# Patient Record
Sex: Male | Born: 1950
Health system: Southern US, Community
[De-identification: ages and names within clinical notes are randomized; demographics above are authoritative.]

## PROBLEM LIST (undated history)

## (undated) DIAGNOSIS — I2510 Atherosclerotic heart disease of native coronary artery without angina pectoris: Secondary | ICD-10-CM

## (undated) DIAGNOSIS — C801 Malignant (primary) neoplasm, unspecified: Secondary | ICD-10-CM

## (undated) DIAGNOSIS — E785 Hyperlipidemia, unspecified: Secondary | ICD-10-CM

## (undated) DIAGNOSIS — Z720 Tobacco use: Secondary | ICD-10-CM

## (undated) DIAGNOSIS — M1990 Unspecified osteoarthritis, unspecified site: Secondary | ICD-10-CM

## (undated) DIAGNOSIS — I10 Essential (primary) hypertension: Secondary | ICD-10-CM

## (undated) DIAGNOSIS — I219 Acute myocardial infarction, unspecified: Secondary | ICD-10-CM

## (undated) DIAGNOSIS — N289 Disorder of kidney and ureter, unspecified: Secondary | ICD-10-CM

## (undated) DIAGNOSIS — I739 Peripheral vascular disease, unspecified: Secondary | ICD-10-CM

## (undated) DIAGNOSIS — J449 Chronic obstructive pulmonary disease, unspecified: Secondary | ICD-10-CM

## (undated) HISTORY — DX: Essential (primary) hypertension: I10

## (undated) HISTORY — DX: Atherosclerotic heart disease of native coronary artery without angina pectoris: I25.10

## (undated) HISTORY — DX: Peripheral vascular disease, unspecified: I73.9

## (undated) HISTORY — PX: APPENDECTOMY: SHX54

## (undated) HISTORY — DX: Tobacco use: Z72.0

## (undated) HISTORY — PX: CHOLECYSTECTOMY: SHX55

## (undated) HISTORY — DX: Acute myocardial infarction, unspecified: I21.9

## (undated) HISTORY — DX: Hyperlipidemia, unspecified: E78.5

## (undated) HISTORY — PX: CORONARY ANGIOPLASTY: SHX604

## (undated) HISTORY — DX: Disorder of kidney and ureter, unspecified: N28.9

## (undated) HISTORY — DX: Unspecified osteoarthritis, unspecified site: M19.90

---

## 2001-01-03 ENCOUNTER — Emergency Department (HOSPITAL_COMMUNITY): Admission: EM | Admit: 2001-01-03 | Discharge: 2001-01-03 | Payer: Self-pay | Admitting: Emergency Medicine

## 2001-01-14 ENCOUNTER — Emergency Department (HOSPITAL_COMMUNITY): Admission: EM | Admit: 2001-01-14 | Discharge: 2001-01-14 | Payer: Self-pay | Admitting: Emergency Medicine

## 2002-06-30 ENCOUNTER — Inpatient Hospital Stay (HOSPITAL_COMMUNITY): Admission: EM | Admit: 2002-06-30 | Discharge: 2002-07-04 | Payer: Self-pay | Admitting: Emergency Medicine

## 2002-06-30 ENCOUNTER — Encounter: Payer: Self-pay | Admitting: Emergency Medicine

## 2003-05-15 ENCOUNTER — Inpatient Hospital Stay (HOSPITAL_COMMUNITY): Admission: EM | Admit: 2003-05-15 | Discharge: 2003-05-18 | Payer: Self-pay | Admitting: Emergency Medicine

## 2003-05-15 ENCOUNTER — Encounter: Payer: Self-pay | Admitting: Emergency Medicine

## 2005-06-21 ENCOUNTER — Ambulatory Visit: Payer: Self-pay | Admitting: Cardiology

## 2005-07-05 ENCOUNTER — Ambulatory Visit: Payer: Self-pay

## 2005-08-29 ENCOUNTER — Ambulatory Visit: Payer: Self-pay | Admitting: Cardiology

## 2005-09-20 ENCOUNTER — Ambulatory Visit: Payer: Self-pay

## 2006-01-01 ENCOUNTER — Ambulatory Visit: Payer: Self-pay | Admitting: Cardiology

## 2006-07-25 ENCOUNTER — Ambulatory Visit: Payer: Self-pay | Admitting: Cardiology

## 2006-07-25 ENCOUNTER — Ambulatory Visit: Payer: Self-pay

## 2006-08-08 ENCOUNTER — Ambulatory Visit: Payer: Self-pay

## 2006-08-08 ENCOUNTER — Encounter: Payer: Self-pay | Admitting: Cardiology

## 2006-09-12 ENCOUNTER — Ambulatory Visit: Payer: Self-pay | Admitting: Cardiology

## 2006-12-19 ENCOUNTER — Emergency Department (HOSPITAL_COMMUNITY): Admission: EM | Admit: 2006-12-19 | Discharge: 2006-12-19 | Payer: Self-pay | Admitting: Family Medicine

## 2007-03-13 ENCOUNTER — Ambulatory Visit: Payer: Self-pay

## 2007-03-13 ENCOUNTER — Ambulatory Visit: Payer: Self-pay | Admitting: Cardiology

## 2007-04-07 ENCOUNTER — Encounter: Admission: RE | Admit: 2007-04-07 | Discharge: 2007-04-07 | Payer: Self-pay | Admitting: Nephrology

## 2007-06-19 ENCOUNTER — Ambulatory Visit: Payer: Self-pay | Admitting: Cardiology

## 2007-09-11 ENCOUNTER — Ambulatory Visit: Payer: Self-pay

## 2007-12-25 ENCOUNTER — Ambulatory Visit: Payer: Self-pay | Admitting: Cardiology

## 2008-05-20 ENCOUNTER — Ambulatory Visit: Payer: Self-pay | Admitting: Vascular Surgery

## 2008-05-20 ENCOUNTER — Ambulatory Visit (HOSPITAL_COMMUNITY): Admission: RE | Admit: 2008-05-20 | Discharge: 2008-05-20 | Payer: Self-pay | Admitting: Internal Medicine

## 2008-10-14 ENCOUNTER — Ambulatory Visit: Payer: Self-pay

## 2008-10-14 ENCOUNTER — Ambulatory Visit: Payer: Self-pay | Admitting: Cardiology

## 2009-04-07 ENCOUNTER — Encounter (INDEPENDENT_AMBULATORY_CARE_PROVIDER_SITE_OTHER): Payer: Self-pay | Admitting: *Deleted

## 2009-06-23 ENCOUNTER — Ambulatory Visit: Payer: Self-pay | Admitting: Vascular Surgery

## 2009-06-23 ENCOUNTER — Encounter: Payer: Self-pay | Admitting: Cardiology

## 2009-08-22 ENCOUNTER — Encounter: Payer: Self-pay | Admitting: Cardiology

## 2009-09-08 ENCOUNTER — Ambulatory Visit: Payer: Self-pay | Admitting: Cardiology

## 2009-09-08 ENCOUNTER — Telehealth (INDEPENDENT_AMBULATORY_CARE_PROVIDER_SITE_OTHER): Payer: Self-pay | Admitting: *Deleted

## 2009-09-08 DIAGNOSIS — I10 Essential (primary) hypertension: Secondary | ICD-10-CM | POA: Insufficient documentation

## 2009-09-08 DIAGNOSIS — E1029 Type 1 diabetes mellitus with other diabetic kidney complication: Secondary | ICD-10-CM | POA: Insufficient documentation

## 2009-09-08 DIAGNOSIS — F17200 Nicotine dependence, unspecified, uncomplicated: Secondary | ICD-10-CM | POA: Insufficient documentation

## 2009-09-08 DIAGNOSIS — N183 Chronic kidney disease, stage 3 (moderate): Secondary | ICD-10-CM

## 2009-09-08 DIAGNOSIS — I2510 Atherosclerotic heart disease of native coronary artery without angina pectoris: Secondary | ICD-10-CM | POA: Insufficient documentation

## 2009-09-08 DIAGNOSIS — E7800 Pure hypercholesterolemia, unspecified: Secondary | ICD-10-CM

## 2009-09-22 ENCOUNTER — Encounter: Payer: Self-pay | Admitting: Cardiology

## 2009-10-26 ENCOUNTER — Ambulatory Visit: Payer: Self-pay | Admitting: Internal Medicine

## 2009-10-26 ENCOUNTER — Observation Stay (HOSPITAL_COMMUNITY): Admission: EM | Admit: 2009-10-26 | Discharge: 2009-10-28 | Payer: Self-pay | Admitting: Emergency Medicine

## 2009-12-08 ENCOUNTER — Ambulatory Visit: Payer: Self-pay | Admitting: Cardiology

## 2009-12-08 DIAGNOSIS — I739 Peripheral vascular disease, unspecified: Secondary | ICD-10-CM

## 2010-02-16 ENCOUNTER — Encounter: Payer: Self-pay | Admitting: Cardiology

## 2010-03-30 ENCOUNTER — Encounter: Payer: Self-pay | Admitting: Cardiology

## 2010-04-13 ENCOUNTER — Ambulatory Visit: Payer: Self-pay | Admitting: Vascular Surgery

## 2010-05-15 ENCOUNTER — Encounter: Payer: Self-pay | Admitting: Cardiology

## 2010-07-24 ENCOUNTER — Ambulatory Visit: Payer: Self-pay | Admitting: Cardiology

## 2010-10-12 ENCOUNTER — Encounter: Payer: Self-pay | Admitting: Cardiology

## 2010-12-11 NOTE — Letter (Signed)
Summary: Dr Mikey Bussing DDS Medical Clearance   Dr Mikey Bussing DDS Medical Clearance   Imported By: Sallee Provencal 06/26/2010 13:03:17  _____________________________________________________________________  External Attachment:    Type:   Image     Comment:   External Document

## 2010-12-11 NOTE — Letter (Signed)
Summary: [facility] Patient Note  [facility] Patient Note   Imported By: Sallee Provencal 06/01/2010 10:41:38  _____________________________________________________________________  External Attachment:    Type:   Image     Comment:   External Document

## 2010-12-11 NOTE — Consult Note (Signed)
Summary: [facility]   Imported By: Marilynne Drivers 01/24/2010 09:14:07  _____________________________________________________________________  External Attachment:    Type:   Image     Comment:   External Document

## 2010-12-11 NOTE — Assessment & Plan Note (Signed)
Summary: EPH   Primary Provider:  [name]   History of Present Illness: Doing well since he was last seen.  No further pain.  He now has sore teeth.  He saw the dentist, and nothing found.  Not exertion related.  Second, he has bilateral breast soreness.  Third, he has a sore on the leg.   Dr. Joylene Draft told him he had a busted vein.  Not as sore as it was.  Beside that "I am in great shape."   He continues to smoke.  His wife smokes as well.    Current Medications (verified): 1)  Plavix 75 Mg Tabs (Clopidogrel Bisulfate) .... Take One Tablet By Mouth Daily 2)  Aspirin 81 Mg Tbec (Aspirin) .... Take One Tablet By Mouth Daily 3)  Metformin Hcl 1000 Mg Tabs (Metformin Hcl) .... Take 1 Tablet By Mouth Two Times A Day 4)  Lovaza 1 Gm Caps (Omega-3-Acid Ethyl Esters) .... Take 1 Capsule By Mouth Two Times A Day 5)  Fenofibrate 54 Mg Tabs (Fenofibrate) .... Take 1 Tablet By Mouth Once A Day 6)  Furosemide 80 Mg Tabs (Furosemide) .... Take One Tablet By Mouth Two Times A Day 7)  Glipizide 10 Mg Xr24h-Tab (Glipizide) .... Take 1 Tablet By Mouth Once A Day 8)  Metoprolol Tartrate 100 Mg Tabs (Metoprolol Tartrate) .... Take One Tablet By Mouth Twice A Day 9)  Exforge 10-320 Mg Tabs (Amlodipine Besylate-Valsartan) .... Take 1 Tablet By Mouth Once A Day 10)  Benazepril Hcl 40 Mg Tabs (Benazepril Hcl) .... Take 1 Tablet By Mouth Two Times A Day 11)  Crestor 5 Mg Tabs (Rosuvastatin Calcium) .... Take One Tablet By Mouth Daily. 12)  Spironolactone 25 Mg Tabs (Spironolactone) .... Take 1 Tablet By Mouth Once A Day 13)  Januvia 50 Mg Tabs (Sitagliptin Phosphate) .... Take 1 Tablet By Mouth Once A Day 14)  Vitamin B-12 500 Mcg  Tabs (Cyanocobalamin) .... Take 1 Tablet By Mouth Once A Day  Allergies (verified): 1)  ! Penicillin  Vital Signs:  Patient profile:   60 year old male Height:      73 inches Weight:      211 pounds BMI:     27.94 Pulse rate:   76 / minute Resp:     16 per minute BP sitting:    152 / 62  (right arm)  Vitals Entered By: Levora Angel, CNA (December 08, 2009 3:33 PM)  Physical Exam  General:  Well developed, well nourished, in no acute distress. Head:  normocephalic and atraumatic Neck:  Neck supple, no JVD. No masses, thyromegaly or abnormal cervical nodes. Lungs:  Prolonged expiration.  Ronchii bilaterally. Heart:  Normal S1 and S2.  Soft SEM.  No DM noted.  Msk:  small knot on medial side of left lower leg.   Extremities:  bifemoral bruits and reduced pulses. Neurologic:  Alert and oriented x 3.   EKG  Procedure date:  12/08/2009  Findings:      NSR.  Occas VPB's.    Cardiac Cath  Procedure date:  10/27/2009  Findings:      ANGIOGRAPHIC DATA: 1. The left main is free of critical disease. 2. The LAD courses to the apex.  After the diagonal takeoff, there is     previously placed Cypher drug-eluting stent in the LAD which     remains widely patent.  Distal to the stent, there is an area of     segmental plaque of probably about 60% luminal  reduction.  The     distal vessel was without critical narrowing.  There is a diagonal     branch with about 50% ostial narrowing. 3. There is a small ramus intermedius with very mild ostial     irregularities.  This represents the distribution of an OM-1. 4. The circumflex is a large-caliber vessel providing a major marginal     branch.  There is less than 30% narrowing in the previously placed     stent.  The AV portion of the vessel also was without critical     narrowing and supplies a posterolateral branch. 5. The right coronary artery is totally occluded.  The distal right     coronary artery fills by collaterals from the left system.   CONCLUSION: 1. Continued patency of left anterior descending stent with moderate     plaque distal to the stent site. 2. Continued patency of the circumflex stent. 3. Total occlusion of the right coronary artery with collaterals.   DISPOSITION:  The LAD does not appear  to be critical.  We certainly would not proceed with intervention today specifically.  I doubt that it is ischemic.  The patient has an extensive history of smoking and has been unable to stop despite his multiple procedures.  We will continue to recommend medical therapy at this point in time with an encouragement to stop smoking.         Loretha Brasil. Lia Foyer, MD, [facility] Electronically Signed  Impression & Recommendations:  Problem # 1:  CORONARY ATHEROSCLEROSIS NATIVE CORONARY ARTERY (ICD-414.01) recent cath with findings as noted.  Encouraged not to smoke.  Discussed at length.   His updated medication list for this problem includes:    Plavix 75 Mg Tabs (Clopidogrel bisulfate) .[name]... Take one tablet by mouth daily    Aspirin 81 Mg Tbec (Aspirin) .[name]... Take one tablet by mouth daily    Metoprolol Tartrate 100 Mg Tabs (Metoprolol tartrate) .[name]... Take one tablet by mouth twice a day    Exforge 10-320 Mg Tabs (Amlodipine besylate-valsartan) .[name]... Take 1 tablet by mouth once a day    Benazepril Hcl 40 Mg Tabs (Benazepril hcl) .[name]... Take 1 tablet by mouth two times a day  Orders: EKG w/ Interpretation (93000)  Problem # 2:  PVD (ICD-443.9) Has had followup dopplers and does not want.  Relationship of smoking to PVD discussed.  Problem # 3:  CHRONIC KIDNEY DISEASE STAGE II (MILD) (ICD-585.2) Sees Deterding.  Has mild renal arterial disease, and favor repeat US in next year or so.  Problem # 4:  HYPERCHOLESTEROLEMIA (ICD-272.0) Followed by Dr. Joylene Draft His updated medication list for this problem includes:    Lovaza 1 Gm Caps (Omega-3-acid ethyl esters) .[name]... Take 1 capsule by mouth two times a day    Fenofibrate 54 Mg Tabs (Fenofibrate) .[name]... Take 1 tablet by mouth once a day    Crestor 5 Mg Tabs (Rosuvastatin calcium) .[name]... Take one tablet by mouth daily.  Orders: EKG w/ Interpretation (93000)  Problem # 5:  Leg lesion NOt sure of etiology.  Could be small lipoma.  Defer to primary  MD and poss derm if persists or worsens.    Patient Instructions: 1)  Your physician recommends that you continue on your current medications as directed. Please refer to the Current Medication list given to you today. 2)  Your physician wants you to follow-up in:   6 MONTHS. You will receive a reminder letter in the mail two months in  advance. If you don't receive a letter, please call our office to schedule the follow-up appointment.

## 2010-12-11 NOTE — Assessment & Plan Note (Signed)
Summary: Travis [name]   Visit Type:  6 months follow up Primary Travis [name]:  Travis [name]  CC:  No complains.  History of Present Illness: Overall doing well.  No chest pain.  No luck with the smoking.  He does not check his sugars, but he says his A1C has been largely unremarkable.  Not as much claudication in left leg as before.  PRior ABI reviewed.  Seen by [facility] one year ago.    Current Medications (verified): 1)  Plavix 75 Mg Tabs (Clopidogrel Bisulfate) .... Take One Tablet By Mouth Daily 2)  Aspirin 81 Mg Tbec (Aspirin) .... Take One Tablet By Mouth Daily 3)  Metformin Hcl 1000 Mg Tabs (Metformin Hcl) .... Take 1 Tablet By Mouth Two Times A Day 4)  Lovaza 1 Gm Caps (Omega-3-Acid Ethyl Esters) .... Take 1 Capsule By Mouth Two Times A Day 5)  Fenofibrate 54 Mg Tabs (Fenofibrate) .... Take 1 Tablet By Mouth Once A Day 6)  Furosemide 80 Mg Tabs (Furosemide) .... Take One Tablet By Mouth Two Times A Day 7)  Glipizide 10 Mg Xr24h-Tab (Glipizide) .... Take 1 Tablet By Mouth Once A Day 8)  Metoprolol Tartrate 100 Mg Tabs (Metoprolol Tartrate) .... Take One Tablet By Mouth Twice A Day 9)  Exforge 10-320 Mg Tabs (Amlodipine Besylate-Valsartan) .... Take 1 Tablet By Mouth Once A Day 10)  Benazepril Hcl 40 Mg Tabs (Benazepril Hcl) .... Take 1 Tablet By Mouth Two Times A Day 11)  Crestor 5 Mg Tabs (Rosuvastatin Calcium) .... Take One Tablet By Mouth Daily. 12)  Januvia 50 Mg Tabs (Sitagliptin Phosphate) .... Take 1 Tablet By Mouth Once A Day 13)  Vitamin B-12 500 Mcg  Tabs (Cyanocobalamin) .... Take 1 Tablet By Mouth Once A Day  Allergies: 1)  ! Penicillin  Past History:  Past Medical History: Last updated: 02/08/2009  PAST MEDICAL HISTORY:  1. Coronary artery disease.     a. PCA in 1994 ?     b. PCI in August 2003 of an obtuse marginal.  the Catheterization at that        time revealed: Left main was normal; LAD 50 to 75% between the first        and second diagonals, 50% after that lesion; left  circumflex 95% in        obtuse marginal (PCI with a Cypher stent); RCA 100% proximal.  2. Peripheral vascular disease status post PCA of the right lower extremity     ?  3. Diabetes.  4. Hypertension.  5. Hyperlipidemia.  Past Surgical History: Last updated: 02/08/2009 Appendectomy Cholecystectomy  Family History: Last updated: 02/08/2009  FAMILY HISTORY:  His father died of cancer.  His mother of coronary disease  at age 16.  He has siblings with strokes, pulmonary embolus, and congestive  heart failure.  Social History: Last updated: 02/08/2009 SOCIAL HISTORY:  He lives in [place], [place], with his wife.  He  works as a Nutritional therapist at [employer].  He has a 40-pack-year  smoking history and currently smokes one-half pack per day.  He does not  drink alcohol.  Vital Signs:  Patient profile:   60 year old male Height:      73 inches Weight:      203.50 pounds BMI:     26.95 Pulse rate:   62 / minute Pulse rhythm:   regular Resp:     18 per minute BP sitting:   140 / 60  (left arm) Cuff  size:   large  Vitals Entered By: Sidney Ace (July 24, 2010 3:44 PM)  Physical Exam  General:  Well developed, well nourished, in no acute distress. Head:  normocephalic and atraumatic Eyes:  PERRLA/EOM intact; conjunctiva and lids normal. Neck:  soft left carotid bruit. Lungs:  Clear bilaterally to auscultation and percussion.  Some ronchii. Heart:  Non-displaced PMI, chest non-tender; regular rate and rhythm, S1, S2 without murmurs, rubs or gallops. Abdomen:  Bowel sounds positive; abdomen soft and non-tender without masses, organomegaly, or hernias noted. No hepatosplenomegaly.  NO loud bruit.  Extremities:  No clubbing or cyanosis.  No ulcers.  Some decrease pulses left foot.  Neurologic:  Alert and oriented x 3.   EKG  Procedure date:  07/24/2010  Findings:      NSR.  Nonspecific ST and T abnormality.   Cardiac Cath  Procedure date:   10/27/2009  Findings:      CONCLUSION: 1. Continued patency of left anterior descending stent with moderate     plaque distal to the stent site. 2. Continued patency of the circumflex stent. 3. Total occlusion of the right coronary artery with collaterals.   DISPOSITION:  The LAD does not appear to be critical.  We certainly would not proceed with intervention today specifically.  I doubt that it is ischemic.  The patient has an extensive history of smoking and has been unable to stop despite his multiple procedures.  We will continue to recommend medical therapy at this point in time with an encouragement to stop smoking.    Impression & Recommendations:  Problem # 1:  CORONARY ATHEROSCLEROSIS NATIVE CORONARY ARTERY (ICD-414.01) remains stable on a medical regimen.  Unfortunately, we cannot get him to stop smoking, and he is nonchalant about it all.   His updated medication list for this problem includes:    Plavix 75 Mg Tabs (Clopidogrel bisulfate) .[name]... Take one tablet by mouth daily    Aspirin 81 Mg Tbec (Aspirin) .[name]... Take one tablet by mouth daily    Metoprolol Tartrate 100 Mg Tabs (Metoprolol tartrate) .[name]... Take one tablet by mouth twice a day    Exforge 10-320 Mg Tabs (Amlodipine besylate-valsartan) .[name]... Take 1 tablet by mouth once a day    Benazepril Hcl 40 Mg Tabs (Benazepril hcl) .[name]... Take 1 tablet by mouth two times a day  Orders: EKG w/ Interpretation (93000)  Problem # 2:  ESSENTIAL HYPERTENSION, BENIGN (ICD-401.1) Followed by nephrology, and Dr. Jimmy Footman.  Breast symptoms have resolved since dc of spironolactone.  However, BP has been a bit higher.  He will see Dr. Jimmy Footman soon.   Of note, eventually ultrasound followup for renal will be important part of long term followup.   The following medications were removed from the medication list:    Spironolactone 25 Mg Tabs (Spironolactone) .[name]... Take 1 tablet by mouth once a day His updated medication list for  this problem includes:    Aspirin 81 Mg Tbec (Aspirin) .[name]... Take one tablet by mouth daily    Furosemide 80 Mg Tabs (Furosemide) .[name]... Take one tablet by mouth two times a day    Metoprolol Tartrate 100 Mg Tabs (Metoprolol tartrate) .[name]... Take one tablet by mouth twice a day    Exforge 10-320 Mg Tabs (Amlodipine besylate-valsartan) .[name]... Take 1 tablet by mouth once a day    Benazepril Hcl 40 Mg Tabs (Benazepril hcl) .[name]... Take 1 tablet by mouth two times a day  Problem # 3:  TOBACCO ABUSE (ICD-305.1) See  above.  Not much luck despite alot of conversation.  He is fully understanding of outcome issues.   Problem # 4:  DIAB W/RENAL MANIFESTS TYPE I [JUV] NOT UNCNTRL (ICD-250.41) Managed by his primary care physician.   His updated medication list for this problem includes:    Aspirin 81 Mg Tbec (Aspirin) .[name]... Take one tablet by mouth daily    Metformin Hcl 1000 Mg Tabs (Metformin hcl) .[name]... Take 1 tablet by mouth two times a day    Glipizide 10 Mg Xr24h-tab (Glipizide) .[name]... Take 1 tablet by mouth once a day    Exforge 10-320 Mg Tabs (Amlodipine besylate-valsartan) .[name]... Take 1 tablet by mouth once a day    Benazepril Hcl 40 Mg Tabs (Benazepril hcl) .[name]... Take 1 tablet by mouth two times a day    Januvia 50 Mg Tabs (Sitagliptin phosphate) .[name]... Take 1 tablet by mouth once a day  Problem # 5:  PVD (ICD-443.9) Major findings are LLE with ABI of .65.   Exercise and discontiuation of smoking advised.    Patient Instructions: 1)  Your physician recommends that you continue on your current medications as directed. Please refer to the Current Medication list given to you today. 2)  Your physician wants you to follow-up in: 6 MONTHS.  You will receive a reminder letter in the mail two months in advance. If you don't receive a letter, please call our office to schedule the follow-up appointment.

## 2011-01-08 NOTE — Letter (Signed)
Summary: [facility]   Imported By: Marilynne Drivers 12/19/2010 16:56:51  _____________________________________________________________________  External Attachment:    Type:   Image     Comment:   External Document

## 2011-01-25 ENCOUNTER — Encounter: Payer: Self-pay | Admitting: Cardiology

## 2011-01-25 ENCOUNTER — Ambulatory Visit (INDEPENDENT_AMBULATORY_CARE_PROVIDER_SITE_OTHER): Payer: BC Managed Care – PPO | Admitting: Cardiology

## 2011-02-07 ENCOUNTER — Ambulatory Visit: Payer: BC Managed Care – PPO | Admitting: Cardiovascular Disease

## 2011-02-07 NOTE — Assessment & Plan Note (Signed)
Summary: f43m  from 07-24-10 ov/lwb   Primary Provider:  [name]   History of Present Illness: Patient rescheduled visit due to delays.  TS  Allergies: 1)  ! Penicillin

## 2011-02-11 ENCOUNTER — Ambulatory Visit: Payer: BC Managed Care – PPO | Admitting: Cardiovascular Disease

## 2011-02-11 LAB — PROTIME-INR
INR: 1.01 (ref 0.00–1.49)
Prothrombin Time: 13.2 seconds (ref 11.6–15.2)
Prothrombin Time: 13.2 seconds (ref 11.6–15.2)

## 2011-02-11 LAB — CARDIAC PANEL(CRET KIN+CKTOT+MB+TROPI)
CK, MB: 0.8 ng/mL (ref 0.3–4.0)
CK, MB: 0.9 ng/mL (ref 0.3–4.0)
Relative Index: INVALID (ref 0.0–2.5)
Total CK: 38 U/L (ref 7–232)
Total CK: 39 U/L (ref 7–232)
Troponin I: 0.02 ng/mL (ref 0.00–0.06)

## 2011-02-11 LAB — BASIC METABOLIC PANEL
CO2: 22 mEq/L (ref 19–32)
CO2: 24 mEq/L (ref 19–32)
Calcium: 8.6 mg/dL (ref 8.4–10.5)
Calcium: 9.3 mg/dL (ref 8.4–10.5)
Creatinine, Ser: 1.1 mg/dL (ref 0.4–1.5)
Creatinine, Ser: 1.51 mg/dL — ABNORMAL HIGH (ref 0.4–1.5)
GFR calc Af Amer: 57 mL/min — ABNORMAL LOW (ref >60)
GFR calc non Af Amer: 47 mL/min — ABNORMAL LOW (ref >60)
Glucose, Bld: 85 mg/dL (ref 70–99)
Sodium: 135 mEq/L (ref 135–145)
Sodium: 139 mEq/L (ref 135–145)

## 2011-02-11 LAB — GLUCOSE, CAPILLARY
Glucose-Capillary: 120 mg/dL — ABNORMAL HIGH (ref 70–99)
Glucose-Capillary: 121 mg/dL — ABNORMAL HIGH (ref 70–99)
Glucose-Capillary: 140 mg/dL — ABNORMAL HIGH (ref 70–99)
Glucose-Capillary: 149 mg/dL — ABNORMAL HIGH (ref 70–99)
Glucose-Capillary: 248 mg/dL — ABNORMAL HIGH (ref 70–99)
Glucose-Capillary: 63 mg/dL — ABNORMAL LOW (ref 70–99)

## 2011-02-11 LAB — CBC
HCT: 39.1 % (ref 39.0–52.0)
Hemoglobin: 12.9 g/dL — ABNORMAL LOW (ref 13.0–17.0)
Hemoglobin: 13.6 g/dL (ref 13.0–17.0)
Hemoglobin: 15.3 g/dL (ref 13.0–17.0)
MCHC: 33.4 g/dL (ref 30.0–36.0)
MCHC: 34.7 g/dL (ref 30.0–36.0)
MCV: 89.4 fL (ref 78.0–100.0)
RBC: 4.33 MIL/uL (ref 4.22–5.81)
RBC: 5.04 MIL/uL (ref 4.22–5.81)
RDW: 13.3 % (ref 11.5–15.5)
RDW: 13.7 % (ref 11.5–15.5)
WBC: 10.9 10*3/uL — ABNORMAL HIGH (ref 4.0–10.5)
WBC: 8.2 10*3/uL (ref 4.0–10.5)

## 2011-02-11 LAB — LIPID PANEL
LDL Cholesterol: 25 mg/dL (ref 0–99)
VLDL: 22 mg/dL (ref 0–40)
VLDL: 26 mg/dL (ref 0–40)

## 2011-02-11 LAB — DIFFERENTIAL
Eosinophils Absolute: 0.4 10*3/uL (ref 0.0–0.7)
Lymphocytes Relative: 16 % (ref 12–46)
Lymphs Abs: 1.7 10*3/uL (ref 0.7–4.0)
Neutro Abs: 8.1 10*3/uL — ABNORMAL HIGH (ref 1.7–7.7)
Neutrophils Relative %: 74 % (ref 43–77)

## 2011-02-11 LAB — HEPARIN LEVEL (UNFRACTIONATED): Heparin Unfractionated: 0.19 IU/mL — ABNORMAL LOW (ref 0.30–0.70)

## 2011-02-11 LAB — POCT CARDIAC MARKERS: Myoglobin, poc: 45 ng/mL (ref 12–200)

## 2011-02-11 LAB — BRAIN NATRIURETIC PEPTIDE: Pro B Natriuretic peptide (BNP): 193 pg/mL — ABNORMAL HIGH (ref 0.0–100.0)

## 2011-03-26 NOTE — Consult Note (Signed)
NEW PATIENT CONSULTATION   Travis [name], Ren M  DOB:  1951/02/08                                       04/13/2010  [id number]   The patient presents today for evaluation of lower extremity arterial  insufficiency.  He is an active 60 year old gentleman with progressive  claudication in his left leg.  He reports that this has been present for  a number of years.  He reports this is a left leg only and is in his  calf.  He denies any claudication symptoms, simply walking around his  home, but over long periods especially over uneven ground or uphill he  reports that he has limiting claudication.  He stops, rests for a few  minutes and is able to continue.  He denies any tissue loss or rest  pain.  He does have venous pathology as well and had a recent episode  several months ago of superficial thrombophlebitis in his left medial  calf, and this resolved and he does not have any history of deep venous  thrombosis in the past.   PAST MEDICAL HISTORY:  Significant for noninsulin dependent diabetes for  approximately 15 years.  He is hypertensive and does have a history of  mild failure.  Does have a history of prior coronary artery stenting and  myocardial infarction approximately 15 years ago as well.   SOCIAL HISTORY:  He is married with three children.  He works as a Artist.  He does smoke, usually cigars.  He does not drink  alcohol.   FAMILY HISTORY:  Is negative for premature atherosclerotic disease.   REVIEW OF SYSTEMS:  Negative for weight loss or weight gain.  CARDIAC:  Negative aside from above.  GI:  Negative.  GU:  Negative.  MUSCULOSKELETAL:  SKIN:  NEUROLOGIC:  All negative.  PSYCHIATRIC:  No depression or anxiety.  ENDOCRINE:  Negative aside from diabetes.  HEMATOLOGIC:  Negative.   PHYSICAL EXAMINATION:  Well-developed well-nourished white male  appearing stated age of 57.  Blood pressure 163/72, pulse of 62,  respirations 18.   Temperature is 97.9, he is in no acute distress.  HEENT:  Normal.  Chest:  Clear bilaterally with no rhonchi or wheezes.  Heart:  Regular rate and rhythm.  Carotid arteries are without bruits  bilaterally.  He has 2+ radial, 2+ femoral pulses bilaterally and 2+  right popliteal pulses.  I do not feel pulses in his feet bilaterally.  He has an absent left popliteal pulse.  Musculoskeletal:  Shows no major  deformity or cyanosis.  Neurologic:  No focal weakness or paresthesias.  Skin:  Without ulcers or rashes.   He did undergo noninvasive vascular studies in our office in August of  last year.  I reviewed this with him.  This does show at that time ankle  index of 0.9 on the right and 0.65 on the left.  I had a long discussion  with Mr. Browder regarding his arterial and venous pathology.  I explained  in all likelihood he does have left superficial femoral artery occlusive  disease.  I explained this is not limb threatening at his current level  and he is able to tolerate this level of claudication.  I explained that  if this should progress or if he should develop rest pain that he would  be a  candidate for arteriography and intervention as indicated.  I also  discussed the significance of venous pathology.  He does have venous  hypertension which appears to be related to superficial saphenous vein  issues.  I explained that this is not dangerous and again would  recommend conservative follow-up and would recommend treatment of his  varicosities for progressive pain or other complications.  I did discuss  the importance of cigarette smoking cessation regarding its relationship  to his arterial insufficiency.  He will notify us should he develop any  progressive symptoms.     Rosetta Posner, M.D.  Electronically Signed   TFE/MEDQ  D:  04/13/2010  T:  04/16/2010  Job:  [id number]   cc:   Elta Guadeloupe A. [name], M.D.

## 2011-03-26 NOTE — Letter (Signed)
December 25, 2007    Mark A. Joylene Draft, M.D.  [address]  [place], [place] [address]   RE:  TERRELL, ODOMS  MRN:  [id number]  /  DOB:  1950-11-22   Dear Elta Guadeloupe,   I had the pleasure seeing [name] in the office today in followup.  As you know, he checks his sugars only when he feels bad which is not  very often, and he does continue to smoke.  He has diabetes, and has  been seeing Dr. Jimmy Footman for hypertension.  A renal ultrasound was done  and reviewed in November.  At that time, he had a normal caliber  proximal abdominal aorta.  Renal artery size was normal bilaterally.  There was probable stable 1-59% bilateral renal artery stenosis, the  renal arteries were thought to be difficult to see, somewhat serpentine  with at least mildly elevated velocities.  I know he has been having his  potassiums and other medicines checked given his medication regimen that  has been prescribed and he has not seen Dr. Jimmy Footman in some time.  We  have encouraged  him to follow up with him as well.  I also know that he  had laboratory studies done in your office recently.   Today on examination the blood pressure is 156/70, the pulse is 55, the  lung fields are clear.  The cardiac rhythm is regular.   Electrocardiogram demonstrates sinus bradycardia with nonspecific  interventricular conduction delay.   To summarize, he appears to be stable from a cardiac standpoint.  We  have not recommended further evaluation at this point in time.  He could  benefit from significant risk factor intervention, but it is apparent  from his evaluation, Dr. Deterding's note, that compliance with  recommendations has been a chronic difficult issue.  We will see him  back in followup  in 6 months and I will recommend a repeat study.  I  appreciate the opportunity of  sharing in his care.    Sincerely,      Loretha Brasil. Lia Foyer, MD, [facility]  Electronically Signed    TDS/MedQ  DD: 12/25/2007  DT: 12/26/2007  Job  #: [id number]   CC:    Jeneen Rinks L. Deterding, M.D.

## 2011-03-26 NOTE — Letter (Signed)
October 14, 2008    Mark A. [name], [address]  [place], [place] [address]   RE:  Travis, [name]  MRN:  [id number]  /  DOB:  05/31/1951   Dear Travis [name],   I had the pleasure of seeing Travis [name] in the office today in followup.  I know, he has been seeing you frequently, and also Travis [name], he appears to  be doing reasonably well.  Unfortunately, he continues to smoke up to 4  cigarettes a day, and he and I had an extensive discussion today about  the inflammatory implications of this.  He underwent repeat renal  ultrasound today.  This demonstrated the technically challenging study.  He had a normal caliber abdominal aorta with normal and symmetrical  kidney size.  Kidney size on the right was 12.8 and kidney size on the  left was 13.1.  There were serpentine bilateral renal arteries with a  stable 1-59% stenosis on the right with normal velocities on the left,  on the previous study, there was 1-59% on the left as well.  This is  particularly important in light of his medication regimen, which you all  have carefully constructed and I reminded him today, he will need to  have frequent followup with both you and him.   CURRENT MEDICATIONS:  1. Metoprolol 100 mg p.o. b.i.d.  2. Plavix 75 mg daily.  3. Glipizide ER 10 mg daily.  4. Metformin 1000 mg b.i.d.  5. TriCor 145 mg a day.  6. Fish oil.  7. Omacor b.i.d.  8. Crestor 5 mg.  9. Spironolactone 25 mg daily.  10.Aspirin 81 mg daily.  11.Furosemide 40 mg b.i.d.  12.Benazepril 40 mg b.i.d.  13.Exforge 10/320 daily.  14.Januvia 50 mg daily.  15.Vitamin B12.   PHYSICAL EXAMINATION:  GENERAL:  He has a slightly reddish complexion.  VITAL SIGNS:  His weight is 217 pounds, down 5 pounds from the last  visit.  Blood pressure is 138/72 and the pulse is 55, employing better  blood pressure control.  LUNGS:  There is mild expiratory rhonchi.  CARDIAC:  On examination, the cardiac rhythm is regular.   Electrocardiogram demonstrates  sinus bradycardia and nonspecific  interventricular conduction block.   Overall, this gentleman is stable.  He has multivessel coronary artery  disease and has had prior percutaneous intervention.  He does continue  to smoke and his risks of ACS, and progressive vascular disease remain  elevated.  At the present time, however, he needs to continue to focus  on risk factor reduction.  We have talked about this again in detail,  and we will see him back in followup in 6 months.  I did not do any  laboratory studies as he said these have been done at the Nephrology  Office.  Thanks again for allowing me to share in his care.    Sincerely,      Loretha Brasil. Lia Foyer, MD, [facility]  Electronically Signed    TDS/MedQ  DD: 10/14/2008  DT: 10/15/2008  Job #: [id number]   CC:    Travis [name], M.D.

## 2011-03-29 ENCOUNTER — Encounter: Payer: Self-pay | Admitting: Cardiology

## 2011-03-29 NOTE — H&P (Signed)
NAME:  Travis [name], Travis [name]                             ACCOUNT NO.:  [id number]   MEDICAL RECORD NO.:  [id number]                   PATIENT TYPE:  EMS   LOCATION:  MAJO                                 FACILITY:  [facility]   PHYSICIAN:  Deboraha Sprang, M.D. [facility]           DATE OF BIRTH:  08/03/1951   DATE OF ADMISSION:  06/30/2002  DATE OF DISCHARGE:                                HISTORY & PHYSICAL   REASON FOR CONSULTATION:  The patient presents to the emergency room,  referred by Dr. Jeannette How. [name], because of chest pain.   HISTORY OF PRESENT ILLNESS:  The patient is a 60 year old gentleman with  known ischemic heart disease, status post angioplasty and rotablator for  restenosis of a lesion in 1993/1994 (specifics not yet available) who has  had no problems with recurrent chest pain until last night.  He awakened  about 1:30 in the morning and noted that he had discomfort in his chest,  neck, jaw, and left shoulder.  This was similar to his discomfort from his  preintervention/intervention syndrome.  The pain persisted for about three  hours and then abated.  He awakened in the morning and was able to go to  work with only mild shoulder discomfort persisting through the day.  Finally, by the end of the day, because of persistent discomfort, his wife  insisted that he go see the doctor.  He went over to Dr. Elta Guadeloupe A. [name]'s  office where an electrocardiogram was obtained demonstrating PVCs which  apparently are old and not previously documented and ST segment depression  in the inferolateral leads which was new compared to 1998.  He was referred  to the emergency room.  His discomfort is absent at the present time.   The discomfort was unassociated with any phenomenon of nausea, diaphoresis,  or shortness of breath.   The patient has a history of peripheral vascular disease and is status post  lower extremity PTA in 1990.  His cardiac risk factors are normal for  treated hypertension,  treated diabetes, treated hyperlipidemia with Niacin  but not with statins and ongoing cigarette use.  The patient walks 30  minutes a day and has actually lost 25 pounds over the last year or so.  He  has had no palpitations, although he has noted the PVCs on his blood  pressure cuff for years.  He denies syncope.  He has had no transient  neurological episodes.   PAST MEDICAL HISTORY:  Notable primarily as above.   PAST SURGICAL HISTORY:  Notable for appendectomy and cholecystectomy.   REVIEW OF SYSTEMS:  Negative for constitutional symptoms.  Ocular  neurologic.  He does have chronic coughs consistent with chronic bronchitis.  His GU and musculoskeletal exam on review is also unrevealing.  He has not  had problems with further claudication.   MEDICATIONS:  1. Aspirin.  2. Niacin 1000 b.i.d.  3. Glucophage 1500 q.d.  4. Lotensin 20 mg q.d. (questionable in conjunction with a thiazide     diuretic).  5. Metoprolol 25 mg q.d.  6. Norvasc 5 mg q.d.  7. Glucotrol 5.  8. Viagra p.r.n.   ALLERGIES:  He is allergic to PENICILLIN.   SOCIAL HISTORY:  He is married.  He has three children and one grandchild.  He works in [employer].   PHYSICAL EXAMINATION:  GENERAL:  On examination, he is a middle-aged  Caucasian male in no acute distress.  VITAL SIGNS:  His blood pressure is 138/75, his pulse is 88.  HEENT:  Demonstrates no scleral icterus or xanthomata.  SKIN:  Notable for acanthosis, nigricantis, as well as skin tags.  BACK:  Without kyphosis or scoliosis.  LUNGS:  Clear.  HEART:  Regular though sounds were diminished.  These sounds were distant.  No murmurs or gallops were appreciated.  NECK:  The carotids were brisk bilaterally without bruits and the neck veins  were flat.  ABDOMEN:  Soft with active bowel sounds without midline pulsation or  hepatomegaly.  PULSES:  Femoral pulses were 2+.  Distal pulses were intact.  No clubbing,  cyanosis, or edema.   NEUROLOGICAL:  Grossly normal.   LABORATORY DATA:  Electrocardiogram dated this evening demonstrated sinus  rhythm at 78 with intervals at 0.14, 0.12, 0.37.  There is an ST segment  depression of 1 to 2 mm in leads II, III, F, V3, V4, V5, and V6.  There are  also PVCs with a morphology suggestive of a left ventricular basilar origin.  There are small Q-waves in the inferior leads just over prior inferior wall  myocardial infarction.  These were present in 1998.   IMPRESSION:  1. Chest pain, consistent with unstable angina pectoris and an acute     coronary syndrome.  2. Echocardiogram showing new ST segment depression in the inferolateral     leads.  3. Known coronary artery disease, status post percutaneous coronary     intervention, percutaneous transluminal angioplasty with rotablator for     restenosis in 1994, questionable vessel.  4. Echocardiogram suggestive of old interior myocardial infarction,     questionable ejection fraction.  5. Peripheral vascular disease, status post lower extremity percutaneous     transluminal coronary angioplasty.  6. Cardiac risk factors notable for hypertension, diabetes, cigarettes,     hyperlipidemia on Niacin therapy.  7. Premature ventricular contractions.   DISPOSITION:  The patient has symptoms consistent with acute coronary  syndrome and his ECG is at normal and would put him into a higher risk group  based on his prior cardiac history and concomitant risk factors.   Given that, therapeutic interventions would include maintenance of aspirin,  the addition of clopidogrel, low molecular weight heparin, and 2b/3a  inhibitor.  We will hold his Glucophage and proceed with cardiac  catheterization.  I suspect that statin therapy would be appropriate but we  will measure fasting lipid profile prior to this.  I have reviewed this with  the patient and his family and Dr. Elta Guadeloupe A. [name].                                              Deboraha Sprang, M.D. [facility]    SCK/MEDQ  D:  06/30/2002  T:  07/02/2002  Job:  [id number]  cc:   Elta Guadeloupe A. [name], M.D.

## 2011-03-29 NOTE — Procedures (Signed)
[facility]                                EXERCISE TREADMILL   NAME:Sorce, BROOKES PICK                          MRN:          [id number]  DATE:07/25/2006                            DOB:          [birth year]    Duration of exercise was 6 minutes 13 seconds.  Maximum heart rate 105, 64%  of PMHR.   COMMENTS:  Mr. Marczak exercised today on the Bruce protocol.  He had  absolutely no chest pain.  The test was terminated due to claudication.  He  has known peripheral vascular disease, and actually underwent Doppler  imaging this morning with ABIs.  Those results are pending.  At peak  exercise, the blood pressure rose substantially at 223/72.  He had no chest  pain, as noted.  There was a rare premature ventricular contraction.  The  resting electrocardiogram demonstrates normal sinus rhythm with small  inferior Q's and a rare PVC.  At maximum stress there was borderline ST  flattening noted in the infero and lateral leads, but these were not  substantial and clearly less than a millimeter.   ASSESSMENT:  The study is nondiagnostic, but he has no exercise-induced  chest pain or significant ST depression.  We plan to do a 2-dimensional  echocardiogram, and to assess left ventricular function.  His last ejection  fraction was 41%.  The patient also has peripheral vascular disease, and his  arterial blood gases are currently pending.  He also has evidence of  renovascular disease, although these do not appear to be flow-limiting.  A  repeat renovascular study will be obtained in November of this year.  Importantly, the stenoses were listed as one 59% right greater than left,  but unlikely significant at the present time.   I have encouraged him to stop smoking.  I have also encouraged him for  weight loss.  Both of these will be important in his long-term followup.                                   Loretha Brasil. Lia Foyer, MD, [facility]   TDS/MedQ  DD:  07/25/2006  DT:   07/27/2006  Job #:  [id number]   cc:   Elta Guadeloupe A. [name], M.D.

## 2011-03-29 NOTE — Cardiovascular Report (Signed)
Travis [name], Travis [name]                             ACCOUNT NO.:  [id number]   MEDICAL RECORD NO.:  [id number]                   PATIENT TYPE:  INP   LOCATION:  2907                                 FACILITY:  [facility]   PHYSICIAN:  Champ Mungo. Lovena Le, M.D.               DATE OF BIRTH:  1951-03-24   DATE OF PROCEDURE:  05/17/2003  DATE OF DISCHARGE:                              CARDIAC CATHETERIZATION   PROCEDURE:  Left heart catheterization with coronary angiography and left  ventriculography.   INDICATIONS FOR PROCEDURE:  Unstable angina pectoris.   INTRODUCTION:  The patient is a 60 year old man with a history of  known  coronary disease, status post  angioplasty in the past. He continues to  smoke. He was admitted to the hospital  with increasing  anginal symptoms.  He ruled out for myocardial infarction. He is now referred for  left heart  catheterization and coronary angiography.   DESCRIPTION OF PROCEDURE:  After informed consent was obtained the patient  was taken to the diagnostic cardiac catheterization laboratory  in a fasting  state. After the usual prepping and draping intravenous Fentanyl and  midazolam was given for sedation. A total of 30 cc of Lidocaine was  infiltrated into the right groin.   The right femoral artery was subsequently punctured and a 7 [place]  hemostatic sheath was placed. The left Judkins catheter was inserted by way  of the hemostatic sheath and advanced into the left main coronary artery.  Coronary angiography of the left main system was then carried out. The left  Judkins catheter was removed and the right Judkins catheter was inserted  through the hemostatic sheath and advanced  into the right coronary artery.  Coronary angiography of the right coronary system was then carried out.   The right coronary catheter was then removed and the pigtail catheter was  inserted retrograde across the aortic valve into the left ventricle. The  left  ventriculography in the RAO projection was carried out with a total of  30 cc of contrast injected at a rate of 12 cc per second. This catheter was  removed and the patient will be removed for angioplasty.   COMPLICATIONS:  There were no immediate procedure complications.   RESULTS:  A.  Hemodynamics:  Left ventricular pressure was 182/19. The  pullback was 176/77 in the aorta.  B.  Left ventriculography:  Left ventriculography performed in the RAO  projection with a total of 30 cc of contrast injected at 12 cc per section  was carried out. This demonstrated overall preserved left ventricular  systolic function. There was posterobasal hypokinesis present.  C.  Coronary angiography:  The left main coronary artery gave rise to the  left circumflex and left anterior descending coronary artery. The left  circumflex gave rise to a large obtuse marginal artery with 2 branches. The  stent site was widely patent.  The distal  circumflex was a small vessel  giving off 1 small  marginal branch before terminating in the AV circumflex.  The LAD gave off 3 diagonal branches. Right after the 1st diagonal branch  there was an 80% to 90% eccentric stenosis of the LAD. The right coronary  was a dominant vessel. It was occluded at its proximal portion. There was  very faint filling of the distal right and PDA by way of the left and right  collaterals.   CONCLUSION:  The study demonstrates 2-vessel coronary artery disease with a  chronically occluded right coronary artery and 80% to 90% mid LAD which is  new since his catheterization back 1 year ago in the setting of preserved  left ventricular systolic function with posterobasal hypokinesis.   PLAN:  The plan will be to proceed with percutaneous coronary intervention  of the left anterior descending artery.                                               Champ Mungo. Lovena Le, M.D.    GWT/MEDQ  D:  05/17/2003  T:  05/17/2003  Job:  [id number]  Elta Guadeloupe A. Joylene Draft,  M.D.  [address]  [place]  [place] [address]  Fax: [place] Lia Foyer, M.D.   cc:   Jeannette How. Joylene Draft, M.D.  [address]  [place]  [place] [address]  Fax: [employer] Lia Foyer, M.D.

## 2011-03-29 NOTE — Cardiovascular Report (Signed)
NAME:  Travis [name], Travis [name]                             ACCOUNT NO.:  [id number]   MEDICAL RECORD NO.:  [id number]                   PATIENT TYPE:  INP   LOCATION:  3313                                 FACILITY:  [facility]   PHYSICIAN:  Loretha Brasil. Lia Foyer, M.D. [facility]         DATE OF BIRTH:  21-Aug-1951   DATE OF PROCEDURE:  07/02/2002  DATE OF DISCHARGE:                              CARDIAC CATHETERIZATION   INDICATIONS FOR PROCEDURE:  The patient is a 60 year old gentleman with  diabetes mellitus who presented with non-ST elevation myocardial infarction.  He was treated medically and then subsequently brought to the lab for  further evaluation.  EKG revealed some gentle precordial ST depression.   PROCEDURE:  1. Left heart catheterization.  2. Selective coronary arteriography.  3. Selective left ventriculography.  4. Percutaneous coronary intervention of the circumflex obtuse marginal     branch.   DESCRIPTION OF PROCEDURE:  The patient was brought to the catheterization  lab and prepped and draped in the usual fashion.  Through an anterior  puncture, the right femoral artery was easily entered.  A #6 French sheath  was placed.  Views of the left and right coronary arteries were obtained in  multiple angiographic projections.  Ventriculography was performed in the  RAO projection.  I then discussed the case with the patient in detail.  I  also had Dr. Sabino Snipes come into the laboratory and we reviewed the  case together.  I then spoke with the patient's wife and family.  The  options at this point include either percutaneous coronary intervention of  the circumflex marginal or possibly revascularization surgery.  There is  moderate disease of the LAD with total occlusion of the right coronary  artery and moderate LV dysfunction.  The patient was desirous of avoiding  revascularization surgery and preferred to have a percutaneous intervention  of the circumflex.  With this, preparations  were made for percutaneous  coronary intervention.  A JL3.5 guiding catheter was utilized with heparin  and Integrilin.  A BMW wire was passed down across the high-grade occlusion  in the obtuse marginal.  The lesion was predilated with a 2.5 x 12-mm  Quantum Maverick balloon.  Stenting was then performed with a 3.5 x 18-mm  Cordis CYPHER stent which was then post dilated to 3.75 using a Quantum  Maverick balloon.  Following this, we carefully placed the stent just distal  to the bifurcation as it involved the lesion just beyond the AV circumflex  takeoff.  The AV circumflex was not at all compromised at the end of the  procedure.  Final angiographic result was excellent.  All catheters were  subsequently removed.  ACT was appropriate during the course of the case.   He was taken to the holding area in satisfactory clinical condition.   HEMODYNAMIC DATA:  1. Central aorta 132/67.  2. Left ventricle 131/10/27.  3. No  aortic to left ventricular gradient on pullback across the aortic     valve.   ANGIOGRAPHIC DATA:  1. The left main coronary artery has some mild luminal irregularity but no     high-grade stenoses.  2. The LAD has a segmental area of 50-70%.  The worst view is in the RAO     cranial view.  The other views appear to be about 50% or less and the     area is smooth and calcified.  This is at the previous site of     directional atherectomy.  There is a second lesion of about 50% in the     mid vessel.  There is a modest-sized diagonal without critical disease.  3. The circumflex provides a large marginal and an AV circumflex.  The AV     circumflex appears to collateralize the distal right coronary.  The large     marginal has about a 95% proximal stenosis which was reduced to about 0%     following stenting.  4. The right coronary artery was totally occluded and as previously noted,     is collateralized in the distal circumflex system.  There was also some     mild  bridging collaterals.  5. Ventriculography in the RAO projection reveals moderate reduction in left     ventricular function.  Ejection fraction is 39%.  6. Proximal root aortography reveals mild aortic regurgitation.   CONCLUSION:  1. Successful percutaneous stenting of the circumflex obtuse marginal using     Cordis CYPHER drug-eluting stent.  2. Total occlusion of the right coronary artery with moderate disease of the     left anterior descending artery as described above.   DISPOSITION:  The patient will need to be followed closely.  Exercise  testing to assess the LAD would be warranted.  The risk of target vessel  revascularization is low and should be in the range of about 4%.  The  patient should be maintained on aspirin and Plavix for at least three months  and potentially longer given his baseline studies.  He should discontinue  smoking and get into the cardiac rehabilitation program.                                               Loretha Brasil. Lia Foyer, M.D. [facility]    TDS/MEDQ  D:  07/02/2002  T:  07/05/2002  Job:  [id number]   cc:   Deboraha Sprang, M.D. [facility]   Fay Records, M.D. [facility]   Cardiac Catheterization Lab   Elta Guadeloupe A. [name], M.D.

## 2011-03-29 NOTE — Letter (Signed)
Mar 13, 2007    Travis [name], M.D.  [address]  [place], [place] [address]   RE:  Travis [name]  MRN:  [id number]  /  DOB:  04/22/1951   Dear Travis [name]:   I had the pleasure of seeing Travis [name] in the office today in followup.  In general he has remained stable.  He plays a fair amount of golf and  continues to bowl.  He does smoke about 5 cigars a day, and,  unfortunately, continues to gain some weight.  He denies any significant  chest pain.   MEDICATIONS:  1. Metoprolol 100 mg p.o. b.i.d.  2. Norvasc 10 mg daily.  3. Plavix 75 mg daily.  4. Benazepril/hydrochlorothiazide 20/25 daily.  5. Benazepril 20 mg nightly.  6. Glipizide ER 10 mg daily.  7. Metformin.  8. Hydrochloride 1000 mg p.o. b.i.d.  9. Aspirin 81 mg daily.  10.Tricor 145 mg daily.  11.Fish oil daily.  12.Diovan 320 mg daily.  13.Crestor 5 mg daily.  14.Spironolactone 25 mg daily.   One additional item was that he has had peripheral vascular studies.  These have demonstrated turbulence with elevated velocities in the  proximal renal arteries bilaterally.  On his last study in September of  2007, there was stable 1%-60% bilateral renal artery abnormalities  suggested.   Today, the patient had peripheral vascular studies per followup  suggested.  His ABIs remain stable with an ABI on the left of 0.71 and  an ABI on the right of 0.86.  He has bilateral SFA disease.   PHYSICAL EXAMINATION:  Today on physical examination, the blood pressure  was 170/68.  When I rechecked it, it was 180/70.  Importantly, he says  that it recently was 130/70.  The pulse is 61 and regular.  The cardiac rhythm is regular without a significant murmur.  The lung fields are clear although he does have prolonged expiration.  His abdomen is protuberant.   His EKG reveals normal sinus rhythm with borderline nonspecific  intraventricular conduction delay.   IMPRESSION:  1. Coronary artery disease with prior percutaneous coronary  intervention with drug-eluting stents in 2 vessels.  2. Borderline bilateral renal artery stenosis.  3. Hypercholesterolemia, on lipid lowering therapy.  4. Severe hypertension.   RECOMMENDATIONS:  1. Encouraged an exercise program and weight loss.  2. Strongly suggested that the patient stop smoking.  3. Three month followup with repeat renal ultrasound at that time.  4. A continued followup with Dr. Joylene [name] for monitoring of his      potassium.  He says that this has been done regularly since his      initiation with spironolactone.    Sincerely,      Loretha Brasil. Lia Foyer, MD, [facility]  Electronically Signed    TDS/MedQ  DD: 03/13/2007  DT: 03/13/2007  Job #: [id number]

## 2011-03-29 NOTE — Letter (Signed)
September 12, 2006    Mark A. Joylene Draft, M.D.  [address]  [place], [place] [address]   RE:  ARMANNI, HADAWAY  MRN:  [id number]  /  DOB:  Jun 23, 1951   Dear Elta Guadeloupe:   I had the pleasure of seeing Shandy Porres in the office today in a followup  visit.  As you know, this very nice gentleman has underlying coronary  disease.  We recently repeated his echocardiogram which an ejection fraction  in the range of 50-55% with mild aortic sclerosis.  There was moderate  hypokinesis in the base of the inferior wall.  He has remained on dual anti-  platelet therapy, and given his overall situation I think this is a wise  choice.  He denies active chest pain.  He does get some claudication  recently when we put him on the treadmill.  He in fact did that.  He has  also had recent renal vascular studies done here.  I will send a copy of  this to your office.  To summarize, he had a normal caliber abdominal aorta  and a stable 1-59% bilateral renal artery stenosis.  He is on ACE  inhibitors, and he is on angiotensin receptor blockades which you have  recently started, and I know you are following this with renal function  studies according to the patient.  I also know that he has started Crestor  on a regular basis recently and while it results in some arthritis  complaints, hopefully the combination of Crestor and Tricor will result in  an improvement.   Today, on examination the blood pressure is 164/68, the pulse is 68, the  lung fields are clear, and the cardiac rhythm is regular.  There is a  minimal bruit at the base of the right neck.  The extremities reveal no  edema.   Overall, this gentleman is stable.  I plan to see him back in followup in 6  months at which time he will have ABIs.  I have encouraged him to stop  smoking, walk regularly, and we have  reemphasized the fact that he could cut down his aspirin to 81 mg daily.  I  have encouraged him that potentially much of his medicine list could be  pared back if in fact he would try to improve on his personal situation.  I  appreciate the opportunity of sharing in his care.  We will see him in 6  months.    Sincerely,      Loretha Brasil. Lia Foyer, MD, [facility]  Electronically Signed    TDS/MedQ  DD: 09/12/2006  DT: 09/12/2006  Job #: [id number]

## 2011-03-29 NOTE — Cardiovascular Report (Signed)
NAME:  Travis [name], Travis [name]                             ACCOUNT NO.:  [id number]   MEDICAL RECORD NO.:  [id number]                   PATIENT TYPE:  INP   LOCATION:  2907                                 FACILITY:  [facility]   PHYSICIAN:  Eustace Quail, M.D.                  DATE OF BIRTH:  Mar 26, 1951   DATE OF PROCEDURE:  05/17/2003  DATE OF DISCHARGE:                              CARDIAC CATHETERIZATION   CLINICAL HISTORY:  The patient is 60 years old and has previously documented  coronary disease.  He has had a previous remote PCI of the LAD and he has  had a previous stenting of the circumflex artery.  He was admitted to the  hospital with chest pain felt to be consistent with unstable angina and  underwent catheterization earlier today by Champ Mungo. Lovena Le, M.D.  Champ Mungo.  Lovena Le, M.D. found a chronically totally occluded right coronary artery  which was small and a 90% lesion in the proximal LAD.  The stent in the  circumflex artery was patent and the overall LV function was good.   PROCEDURE:  The procedure was performed via the right femoral artery  utilizing an arterial sheath and 7-French JL4 guiding catheter with side  holes.  The patient was given Angiomax bolus and infusion.  He received  Plavix 300 mg, but not until after the procedure.  We crossed the lesion  with a short Luge wire without difficulty.  We direct stented with a 3.0 x  28 mm Cypher stent performing this with one inflation of 18 atmospheres for  40 seconds.  We then post dilated with a 3.5 x 20 mm Quantum Maverick  performing three inflations up to 18 atmospheres for 30 seconds.  Repeat  diagnostic study was __________ the guiding catheter.  The patient tolerated  products of conception well and left the laboratory in satisfactory  condition.   RESULTS:  Initially the stenosis in the LAD was estimated at 90% and located  after a large septal perforator and a large first diagonal branch.  There  was a long area of  segmental disease.  Following stenting this improved to  less than 10%.  There was no dissection seen.   CONCLUSION:  Successful stenting of the lesion in the proximal left anterior  descending with a Cypher stent with improvement in sentinel narrowing from  90% to less than 10%.   DISPOSITION:  The patient was transferred to the unit for further  observation.                                               Eustace Quail, M.D.    BB/MEDQ  D:  05/17/2003  T:  05/17/2003  Job:  [id number]  Elta Guadeloupe A. [name],  M.D.  [address]  [place]  [place] [address]  Fax: [place] Lia Foyer, M.D.   Cardiopulmonary Lab   cc:   Elta Guadeloupe A. Joylene Draft, M.D.  [address]  [place]  [place] [address]  Fax: [place] Lia Foyer, M.D.   Cardiopulmonary Lab

## 2011-03-29 NOTE — H&P (Signed)
Travis [name], Travis [name]                             ACCOUNT NO.:  [id number]   MEDICAL RECORD NO.:  [id number]                   PATIENT TYPE:  INP   LOCATION:  2907                                 FACILITY:  [facility]   PHYSICIAN:  Champ Mungo. Lovena Le, M.D.               DATE OF BIRTH:  16-Feb-1951   DATE OF ADMISSION:  05/14/2003  DATE OF DISCHARGE:                                HISTORY & PHYSICAL   PHYSICIANS:  Primary cardiologist, Loretha Brasil. Lia Foyer, M.D.  Primary care physician, Elta Guadeloupe A. [name], M.D.   HISTORY OF PRESENT ILLNESS:  Travis [name] is a 60 year old gentleman with  coronary artery disease who complains of his typical cardiac chest pain as  of approximately 1 p.m. on May 14, 2003.  He states he was feeling well but  had some chest discomfort with exertion.  It was 5/10 in intensity and  radiated to the left upper extremity and back.  These symptoms resolved with  rest.  He did well until approximately 3 p.m. when he had another episode  with exertion.  Prior to going to bed, however, he had an episode of chest  discomfort at rest.  Because of this, he presented to the emergency  department.   The patient has had no dyspnea, diaphoresis, nausea, vomiting,  palpitations,presyncope, syncope, orthopnea, PND, or edema.   PAST MEDICAL HISTORY:  1. Coronary artery disease.     a. PCA in 1994 ?     b. PCI in August 2003 of an obtuse marginal.  the Catheterization at that        time revealed: Left main was normal; LAD 50 to 75% between the first        and second diagonals, 50% after that lesion; left circumflex 95% in        obtuse marginal (PCI with a Cypher stent); RCA 100% proximal.  2. Peripheral vascular disease status post PCA of the right lower extremity     ?  3. Diabetes.  4. Hypertension.  5. Hyperlipidemia.   ALLERGIES:  PENICILLIN.   MEDICATIONS ON ADMISSION:  1. Aspirin 325 mg daily.  2. Lotensin 20 mg q.h.s.  3. Lotensin/hydrochlorothiazide 20/25 mg daily.  4.  Glucophage 500 mg b.i.d.  5. Niacin 1 g b.i.d.  6. Toprol XL 50 mg daily.  7. Glipizide 5 mg daily.   SOCIAL HISTORY:  He lives in [place], [place], with his wife.  He  works as a Nutritional therapist at [employer].  He has a 40-pack-year  smoking history and currently smokes one-half pack per day.  He does not  drink alcohol.   FAMILY HISTORY:  His father died of cancer.  His mother of coronary disease  at age 29.  He has siblings with strokes, pulmonary embolus, and congestive  heart failure.   REVIEW OF SYSTEMS:  Positive only for what is mentioned in  the HPI.   PHYSICAL EXAMINATION:  VITAL SIGNS:  Temperature 98.4, blood pressure  131/73, heart rate 60 and regular.  He is 230 pounds, 6 feet 1 inch.  He was  saturating 100% on room air.  GENERAL:  No acute distress.  NECK:  Without JVD or carotid bruits.  CARDIOVASCULAR:  Regular rate and rhythm without gallop or murmur.  LUNGS:  Clear.  ABDOMEN:  No bruits.  RECTAL:  Heme negative.  EXTREMITIES:  No edema, 1+ dorsalis pedis pulses, 2+ femoral pulses, but a  right-sided femoral bruit.  NEUROLOGIC:  Nonfocal.   LABORATORY DATA:  Chest x-ray suggests some mild edema.   ECG revealed normal sinus rhythm at a rate of 65 with inferior Q waves and  possible ischemia in the anterolateral leads.   White count 10.3, hematocrit 45.5, platelet count 205.  Sodium 140,  potassium 3.7, bicarbonate 21, BUN 21, creatinine 1, glucose 100.  CK 97, MB  1.5, troponin 0.03.  PTT 28, PT 12, INR 0.8.   ASSESSMENT AND PLAN:  1. Acute coronary syndrome.  Thus far, it sounds like unstable angina.  We     will admit him to a telemetry bed and observe him while we rule him out     for myocardial infarction.  He as started on heparin by the emergency     room physician, and we will continue this as well as Integrilin.  We will     continue metoprolol and benazepril.  We will change his statin to     Lipitor 80 mg q.h.s. and change his  aspirin to 162 mg.  Until we further     identify his coronary anatomy, we will hold off on Plavix.  We will     consider cardiac catheterization on Tuesday, May 17, 2003.  2. Diabetes.  We will hold his Glucophage in anticipation of cardiac     catheterization.  I will cover him with sliding scale insulin.     Travis [name], M.D. [facility]            Champ Mungo. Lovena Le, M.D.    RPK/MEDQ  D:  05/15/2003  T:  05/16/2003  Job:  [id number]

## 2011-03-29 NOTE — Discharge Summary (Signed)
NAME:  Travis [name], Travis [name]                             ACCOUNT NO.:  [id number]   MEDICAL RECORD NO.:  [id number]                   PATIENT TYPE:  INP   LOCATION:  [id number]                                 FACILITY:  [facility]   PHYSICIAN:  Tad Moore, PA [facility]              DATE OF BIRTH:  [birth year]   DATE OF ADMISSION:  06/30/2002  DATE OF DISCHARGE:  07/03/2002                                 DISCHARGE SUMMARY   DISCHARGE DIAGNOSES:  1. Non-Q wave myocardial infarction.  2. Hypertension.  3. Diabetes mellitus.  4. Hyperlipidemia.   HISTORY OF PRESENT ILLNESS:  This is a 60 year old male who presented to the  emergency room on August 20, complaining of chest pain.  He was seen and  admitted by Deboraha Sprang, M.D. [facility] Dr. Caryl Comes felt that the patient's pain  was consistent with unstable angina and acute coronary syndrome.   HOSPITAL COURSE:  The patient was started on Lovenox and cardiac  catheterization was planned for the following day.  The next day, the  patient was seen by Fay Records, M.D. [facility].  She noted that the patient had  ruled in for a non-Q wave myocardial infarction.  The patient was currently  pain-free.  She also noted that he had been enrolled in the Synergy Trial.  As a result, he was receiving Integrilin instead of Lovenox.  His  catheterization was delayed until the following day.   On August 22, the patient was taken to the catheterization lab by Loretha Brasil.  Lia Foyer, M.D. [facility].  Catheterization results:  1) Left main coronary artery  with no significant disease.  2) Left anterior descending coronary artery  with long 50 to 70% lesion between the two diagonals, followed by 50% lesion  in the midvessel.  3) Left circumflex with 95% lesion in the proximal  portion of the obtuse marginal branch.  This was reduced down to 0% residual  and a Cypher stent was placed.  4) Right coronary artery was totally  occluded proximally.  5) Left ventriculogram; inferior hypokinesis with  an  ejection fraction of 39%.   The next day, the patient was doing well and had no further chest pain or  shortness of breath.  He has been somewhat bradycardic and his Lopressor was  reduced in dose.  He also had been somewhat hypotensive and the  hydrochlorothiazide was discontinued.   On August 24, the patient was doing well, had no chest pain or shortness of  breath.  His hypertension had resolved.  He had some episodes of bradycardia  into the high 40s and the Lopressor dose was reduced further.  Otherwise, he  was felt to be stable for discharge.   DISCHARGE MEDICATIONS:  1. Plavix 75 mg q.d. for three to six months.  2. Glucotrol XL 5 mg q.d.  3. Enteric-coated aspirin 325 mg q.d.  4. Lotensin  20 mg q.d.  5. Glucophage 500 mg b.i.d., to be restarted on Monday, August 25.  6. Niacin 1000 mg b.i.d.  7. Sublingual nitroglycerin as needed.  8. Toprol XL 25 mg q.d.  9. Pravachol 20 mg q.h.s.   LABORATORY DATA:  Total cholesterol 163, triglycerides 225, HDL 37, LDL 81.  White count 9.5, hemoglobin 12.8, hematocrit 39.6, platelets 237, potassium  4.1, chloride 107, CO2 29, BUN 9, creatinine 0.9, glucose 128.   Electrocardiogram shows sinus rhythm at 62.  There are also occasional PVCs.  PR interval 146, QRS 116, QTC 437, axis 54.  There was ST segment depression  in lead 2, and T wave inversions in leads V4 through 6.   DISCHARGE INSTRUCTIONS:  The patient is to avoid driving, heavy lifting, or  tub baths for two days.  He is to remain off work until seen by physician.  He is to follow a low fat, diabetic diet.  He is to watch the  catheterization site for any pain, bleeding, or swelling, and to call the  [facility] for any of these problems.  He is to follow up with  cardiology P.A. appointment in approximately 10 days.  The office will call.  He is to follow up with Dr. Joylene Draft as needed for scheduled.                                               Tad Moore, PA  [facility]    CKM/MEDQ  D:  07/04/2002  T:  07/06/2002  Job:  [id number]   cc:   Elta Guadeloupe A. [name], M.D.   Deboraha Sprang, M.D. [facility]

## 2011-03-29 NOTE — Discharge Summary (Signed)
Travis [name], MONDAY                             ACCOUNT NO.:  [id number]   MEDICAL RECORD NO.:  [id number]                   PATIENT TYPE:  INP   LOCATION:  2907                                 FACILITY:  [facility]   PHYSICIAN:  Loretha Brasil. Lia Foyer, M.D.             DATE OF BIRTH:  [birth year]   DATE OF ADMISSION:  05/14/2003  DATE OF DISCHARGE:  05/18/2003                           DISCHARGE SUMMARY - REFERRING   HISTORY OF PRESENT ILLNESS:  The patient is a 60 year old male who presented  with chest discomfort radiating to his left upper extremity and back, which  started at approximately 1 p.m. while exerting himself, but resolved. The  second episode occurred associated with exertion at approximately 3 p.m.  Before bed, however, he began having recurring symptoms at rest, so he  presented to the emergency room. He did not have any associated shortness of  breath, diaphoresis, nausea, vomiting, palpitations, or presyncope.  He gave  the discomfort a 5 on a scale of 0 to 10.  His history is notable for  penicillin allergy, peripheral vascular disease with possible intervention  to his right lower extremity, diabetes, hypertension, hyperlipidemia, and  known coronary artery disease with prior interventions, and continued  tobacco use.   LABORATORY DATA:  Admission H&H is 15.4 and 45.5, normal indices.  Platelets  205, WBC 10.3, PT 12.0, PTT 28.  Subsequent hematologies were unremarkable.  Admission sodium was 139, potassium 3.5, BUN 18, creatinine 1.0, glucose  105.  Normal LFT's. Hemoglobin A1C was slightly elevated at 6.9.  Four CK-  MB's and four troponins were negative for myocardial infarction. Fasting  lipids showed a total cholesterol 92, triglycerides 106, HDL 37, LDL 34.   EKG showed normal sinus rhythm, nonspecific ST T wave changes, small  inferior T waves.  No significant change from prior tracing.   HOSPITAL COURSE:  The patient was admitted to 2900, placed on IV heparin,  and Integrelin.  His statin was changed to Lipitor and his Glucophage was  held in the anticipation of cardiac catheterization. Overnight he did not  have any further chest discomfort. Enzymes and EKG's were negative for  myocardial infarction.  Cardiac catheterization performed on May 17, 2003,  by Champ Mungo. Lovena Le, M.D. showed some posterior basilar hypokinesis.  He had  an 80 to 90% mid LAD, he had 100% proximal RCA with left to right  collaterals.  Dr. Lovena Le felt that the RCA occlusion was chronic and that  the LAD lesion was new since his last catheterization in August of 2003.  After review with Dr. Lia Foyer, he performed Cypher stenting reducing the LAD  lesion from 90 to 10%.  Post sheath removal and bed rest, the patient was  ambulating in the halls without difficulty.  Catheterization site was  intact.  Prior to discharge, cardiac rehab saw the patient and lipids and  hemoglobin A1C were ordered.  Smoking  cessation was also discussed with the  patient.   DISCHARGE DIAGNOSES:  1. Unstable angina.  2. Progressive coronary artery disease, status post stenting to the mid LAD     as previously described.  3. Hyperglycemia with an elevated hemoglobin A1C.  4. Hyperlipidemia.  5. Continued tobacco abuse.   DISPOSITION:  He was discharged home on Plavix 75 mg daily, enteric-coated  aspirin 325 mg daily, Lotensin 20 mg q.h.s., Lotensin/HCTZ 20/25 daily.  He  was given permission to resume Glucophage 500 mg b.i.d. on Friday, Niacin 1  gram b.i.d., Toprol XL 50 mg daily, Glipizide 5 mg daily, nitroglycerin 0.4  as needed.  He was advised no lifting, driving, sexual activity, or heavy  exertion for two days.  Maintain low salt, fat, cholesterol, ADA diet.  If  he  has any problems with his catheterization site, he was asked to call.  He  was asked to avoid smoking or tobacco products.  He will see Dr. Lia Foyer on  July 26, at 2:15 p.m.  At the time of follow-up with Dr. Lia Foyer, review  of  lipid panel and further adjustments with medications and encouragement for  smoking cessation should be pursued.     Sharyl Nimrod, P.A. [facility]                    Thomas D. Lia Foyer, M.D.    EW/MEDQ  D:  05/24/2003  T:  05/25/2003  Job:  [id number]   cc:   Elta Guadeloupe A. Joylene Draft, M.D.  [address]  [place]  [place] [address]  Fax: [phone]    cc:   Jeannette How. Joylene Draft, M.D.  [address]  [place]  [place] [address]  Fax: [phone]

## 2011-04-09 ENCOUNTER — Encounter: Payer: Self-pay | Admitting: Cardiology

## 2011-04-19 ENCOUNTER — Encounter: Payer: Self-pay | Admitting: Cardiology

## 2011-04-19 ENCOUNTER — Ambulatory Visit (INDEPENDENT_AMBULATORY_CARE_PROVIDER_SITE_OTHER): Payer: BC Managed Care – PPO | Admitting: Cardiology

## 2011-04-19 DIAGNOSIS — I10 Essential (primary) hypertension: Secondary | ICD-10-CM

## 2011-04-19 DIAGNOSIS — E7800 Pure hypercholesterolemia, unspecified: Secondary | ICD-10-CM

## 2011-04-19 DIAGNOSIS — F17200 Nicotine dependence, unspecified, uncomplicated: Secondary | ICD-10-CM

## 2011-04-19 DIAGNOSIS — I2510 Atherosclerotic heart disease of native coronary artery without angina pectoris: Secondary | ICD-10-CM

## 2011-04-19 NOTE — Assessment & Plan Note (Signed)
DIscussed role of smoking.  Renal artery [place] in 2008 did not demonstrate any renal stenosis.

## 2011-04-19 NOTE — Progress Notes (Signed)
HPI:  He is doing pretty well.  [place] the beach [place] ago, and had a sharp, knife like pain from the r shoulder through to the chest.  911 came, and told him he was ok, and his EKG was ok.  He ended up getting seen by Dr. Joylene Draft, and he was fine at that point, ECG was ok.  His symptoms were not typical cardiac type symptoms.  Denies shortness of breath.  He STILL SMOKES.  Long discussion about this at this point in time.    Current Outpatient Prescriptions  Medication Sig Dispense Refill  . amLODipine (NORVASC) 10 MG tablet Take 10 mg by mouth daily.        [name] aspirin (ASPIR-81) 81 MG EC tablet Take 81 mg by mouth daily.        . benazepril (LOTENSIN) 40 MG tablet Take 40 mg by mouth daily.        . clopidogrel (PLAVIX) 75 MG tablet Take 75 mg by mouth daily.        . fenofibrate 54 MG tablet Take 54 mg by mouth daily.        . furosemide (LASIX) 80 MG tablet Take 80 mg by mouth 2 (two) times daily.        [name] glipiZIDE (GLUCOTROL) 10 MG 24 hr tablet Take 10 mg by mouth daily.        [name] losartan (COZAAR) 100 MG tablet Take 100 mg by mouth daily.        . metFORMIN (GLUCOPHAGE) 1000 MG tablet Take 1,000 mg by mouth 2 (two) times daily with a meal.        . metoprolol (LOPRESSOR) 100 MG tablet Take 100 mg by mouth 2 (two) times daily.        [name] omega-3 acid ethyl esters (LOVAZA) 1 G capsule Take by mouth daily.        . rosuvastatin (CRESTOR) 10 MG tablet Take 10 mg by mouth. 1/2 po daily       . sitaGLIPtan (JANUVIA) 50 MG tablet Take 50 mg by mouth daily.        . vitamin B-12 (CYANOCOBALAMIN) 500 MCG tablet Take 500 mcg by mouth daily.        [name] DISCONTD: amLODipine-valsartan (EXFORGE) 10-320 MG per tablet Take 1 tablet by mouth daily.        [name] DISCONTD: rosuvastatin (CRESTOR) 5 MG tablet Take 5 mg by mouth daily.          Allergies  Allergen Reactions  . Penicillins     Past Medical History  Diagnosis Date  . Coronary artery disease     PCA 1994l; PCI august 2003 of an obtust  marginal. Catheterization at the time reveal: left main normal; LAD 50-75% between first and second diagonals, 50% after lesion; left circumflex 95% in obtuse marg. RCA 100% proximal.  . Diabetes mellitus     type 2  . Peripheral vascular disease   . Hypertension   . Hyperlipemia   . Kidney disease     chronic; stage 2  . Osteoarthritis   . Tobacco abuse     Past Surgical History  Procedure Date  . Appendectomy   . Cholecystectomy     Family History  Problem Relation Age of Onset  . Coronary artery disease Mother   . Cancer Father   . Stroke Other   . Heart failure Other   . Pulmonary embolism Other     History  Social History  . Marital Status: Married    Spouse Name: N/A    Number of Children: N/A  . Years of Education: N/A   Occupational History  . Not on file.   Social History Main Topics  . Smoking status: Current Everyday Smoker  . Smokeless tobacco: Not on file  . Alcohol Use: No  . Drug Use: Not on file  . Sexually Active: Not on file   Other Topics Concern  . Not on file   Social History Narrative   Has a 40-pack-year smoking history and currently smokes 1/2 pack per day.     ROS: Please see the HPI.  All other systems reviewed and negative.  PHYSICAL EXAM:  BP 142/60  Pulse 61  Resp 16  Ht 6\' 1"  (1.854 m)  Wt 201 lb (91.173 kg)  BMI 26.52 kg/m2  General: Well developed, well nourished, in no acute distress. Head:  Normocephalic and atraumatic. Neck: no JVD.   Minimal R carotid bruit.  Lungs: Clear to auscultation and percussion. Heart: Normal S1 and S2.  No murmur, rubs or gallops.  Abdomen:  Normal bowel sounds; soft; non tender; no organomegaly.  No bruits.  Pulses: Pulses normal in all 4 extremities. Extremities: No clubbing or cyanosis. No edema. Neurologic: Alert and oriented x 3.  EKG:  NSR  Nonspecific IVCD.  PACs.    ASSESSMENT AND PLAN:

## 2011-04-19 NOTE — Assessment & Plan Note (Signed)
Most recent numbers are at goal.  LDL 38, HDL 31.

## 2011-04-19 NOTE — Assessment & Plan Note (Signed)
See my notes.  Related to cardiac risk, and also related to hypertension.

## 2011-04-19 NOTE — Assessment & Plan Note (Signed)
NO current symptoms.  Need to stop smoking reviewed.  We discussed at length.  Symptoms two weeks ago were not likely cardiac, and yet risk of MI remains somewhat high.

## 2011-04-19 NOTE — Patient Instructions (Signed)
Your physician wants you to follow-up in: 1 YEAR.  You will receive a reminder letter in the mail two months in advance. If you don't receive a letter, please call our office to schedule the follow-up appointment.  Your physician recommends that you continue on your current medications as directed. Please refer to the Current Medication list given to you today.  

## 2011-05-16 ENCOUNTER — Other Ambulatory Visit: Payer: Self-pay | Admitting: *Deleted

## 2011-05-16 MED ORDER — CLOPIDOGREL BISULFATE 75 MG PO TABS: 75.0000 mg | ORAL_TABLET | Freq: Every day | ORAL | Status: DC

## 2011-12-13 ENCOUNTER — Encounter: Payer: Self-pay | Admitting: Cardiology

## 2012-04-28 ENCOUNTER — Other Ambulatory Visit: Payer: Self-pay | Admitting: Cardiology

## 2012-05-06 ENCOUNTER — Ambulatory Visit: Payer: BC Managed Care – PPO | Admitting: Cardiology

## 2012-05-29 ENCOUNTER — Encounter: Payer: Self-pay | Admitting: Cardiology

## 2012-05-29 ENCOUNTER — Ambulatory Visit (INDEPENDENT_AMBULATORY_CARE_PROVIDER_SITE_OTHER): Payer: BC Managed Care – PPO | Admitting: Cardiology

## 2012-05-29 VITALS — BP 152/70 | HR 64 | Resp 18 | Ht 72.0 in | Wt 208.0 lb

## 2012-05-29 DIAGNOSIS — I739 Peripheral vascular disease, unspecified: Secondary | ICD-10-CM

## 2012-05-29 DIAGNOSIS — E7800 Pure hypercholesterolemia, unspecified: Secondary | ICD-10-CM

## 2012-05-29 DIAGNOSIS — I10 Essential (primary) hypertension: Secondary | ICD-10-CM

## 2012-05-29 DIAGNOSIS — I2510 Atherosclerotic heart disease of native coronary artery without angina pectoris: Secondary | ICD-10-CM

## 2012-05-29 DIAGNOSIS — N182 Chronic kidney disease, stage 2 (mild): Secondary | ICD-10-CM

## 2012-05-29 DIAGNOSIS — F17200 Nicotine dependence, unspecified, uncomplicated: Secondary | ICD-10-CM

## 2012-05-29 NOTE — Progress Notes (Signed)
HPI:  Patient continues to follow up in the clinic.  He has been stable from a cardiac standpoint.  He is also seeing the nephrologists  (Dr. Jimmy Footman)  as well as following closely with Dr.[name].  He maintains a bit of chronic cough and continues to smoke about a 1/2 pack per day.  He is better than he was however. He denies any chest pain.  He also remains on very low dose rosuvastatin.  His last echo was 2007 with the following findings. LEFT VENTRICLE: - There is moderate hypokinesis at the base of the inferior wall. - The left ventricle was mildly dilated. - Left ventricular ejection fraction was estimated , range being 50 % to 55 %. - Left ventricular wall thickness was normal. AORTIC VALVE: - There is aortic valve sclerosis. - The aortic valve was trileaflet. - There was lower normal aortic valve leaflet excursion.  He had no gradient across the valve at his 2010 cath however.       Current Outpatient Prescriptions  Medication Sig Dispense Refill  . amLODipine (NORVASC) 10 MG tablet Take 10 mg by mouth daily.        [name] aspirin (ASPIR-81) 81 MG EC tablet Take 81 mg by mouth daily.        . benazepril (LOTENSIN) 40 MG tablet Take 40 mg by mouth daily.        . clopidogrel (PLAVIX) 75 MG tablet TAKE 1 TABLET (75 MG TOTAL) BY MOUTH DAILY.  30 tablet  6  . fenofibrate 54 MG tablet Take 54 mg by mouth daily.        . furosemide (LASIX) 80 MG tablet Take 80 mg by mouth 2 (two) times daily.        [name] glipiZIDE (GLUCOTROL) 10 MG 24 hr tablet Take 10 mg by mouth daily.        [name] losartan (COZAAR) 100 MG tablet Take 100 mg by mouth daily.        . metFORMIN (GLUCOPHAGE) 1000 MG tablet Take 1,000 mg by mouth 2 (two) times daily with a meal.        . metoprolol (LOPRESSOR) 100 MG tablet Take 100 mg by mouth 2 (two) times daily.        [name] omega-3 acid ethyl esters (LOVAZA) 1 G capsule Take by mouth daily.        . rosuvastatin (CRESTOR) 10 MG tablet Take 10 mg by mouth. 1/2 po daily       .  sitaGLIPtan (JANUVIA) 50 MG tablet Take 50 mg by mouth daily.        . vitamin B-12 (CYANOCOBALAMIN) 500 MCG tablet Take 500 mcg by mouth daily.          Allergies  Allergen Reactions  . Penicillins     Past Medical History  Diagnosis Date  . Coronary artery disease     PCA 1994l; PCI august 2003 of an obtust marginal. Catheterization at the time reveal: left main normal; LAD 50-75% between first and second diagonals, 50% after lesion; left circumflex 95% in obtuse marg. RCA 100% proximal.  . Diabetes mellitus     type 2  . Peripheral vascular disease   . Hypertension   . Hyperlipemia   . Kidney disease     chronic; stage 2  . Osteoarthritis   . Tobacco abuse     Past Surgical History  Procedure Date  . Appendectomy   . Cholecystectomy     Family History  Problem  Relation Age of Onset  . Coronary artery disease Mother   . Cancer Father   . Stroke Other   . Heart failure Other   . Pulmonary embolism Other     History   Social History  . Marital Status: Married    Spouse Name: N/A    Number of Children: N/A  . Years of Education: N/A   Occupational History  . Not on file.   Social History Main Topics  . Smoking status: Current Everyday Smoker  . Smokeless tobacco: Not on file  . Alcohol Use: No  . Drug Use: Not on file  . Sexually Active: Not on file   Other Topics Concern  . Not on file   Social History Narrative   Has a 40-pack-year smoking history and currently smokes 1/2 pack per day.     ROS: Please see the HPI.  All other systems reviewed and negative.  PHYSICAL EXAM:  BP 152/70  Pulse 64  Resp 18  Ht 6' (1.829 m)  Wt 208 lb (94.348 kg)  BMI 28.21 kg/m2  SpO2 98%  General: Well developed, well nourished, in no acute distress. Head:  Normocephalic and atraumatic. Neck: no JVD.  Minimal r carotid bruit.   Lungs: Marked decrease in BS with prolonged expiration, and slight ronchii.  Heart: Normal S1 and S2.  Somewhat distant HS, but  soft SEM.  No DM.  Abdomen:  Normal bowel sounds; soft; non tender; no organomegaly Pulses: Decreased pulses in the feet with fem bruits.  Extremities: No clubbing or cyanosis. No edema. Neurologic: Alert and oriented x 3.  EKG:  NSR.  LVH.  Nonspecific ST and T changes, similar to prior tracings.  Multiple tracings reviewed.    ASSESSMENT AND PLAN:

## 2012-05-29 NOTE — Patient Instructions (Addendum)
Your physician wants you to follow-up in: March 2014. You will receive a reminder letter in the mail two months in advance. If you don't receive a letter, please call our office to schedule the follow-up appointment.  Your physician recommends that you continue on your current medications as directed. Please refer to the Current Medication list given to you today.

## 2012-06-12 ENCOUNTER — Encounter: Payer: Self-pay | Admitting: Cardiology

## 2012-06-14 NOTE — Assessment & Plan Note (Signed)
No symptoms at present.  Had stents to both the LAD and CFX which were open in 2010  (Cypher).  Has total RCA.  No recurrent angina.  He does understand the issues with regard to the potential for progression with DM and smoking.  We have been discussing this for a very long time.

## 2012-06-14 NOTE — Assessment & Plan Note (Signed)
Has been unable to stop.

## 2012-06-14 NOTE — Assessment & Plan Note (Signed)
Has been seen at [facility].  Last studies we have are with modest reductions in ABI left greater than right.

## 2012-06-14 NOTE — Assessment & Plan Note (Signed)
Remains borderline.

## 2012-06-14 NOTE — Assessment & Plan Note (Signed)
Continues to be followed for HTN and CKD management with Dr. Jimmy Footman.

## 2012-06-14 NOTE — Assessment & Plan Note (Signed)
Managed by Dr. Joylene Draft.

## 2012-07-29 ENCOUNTER — Other Ambulatory Visit: Payer: Self-pay

## 2012-07-29 ENCOUNTER — Encounter (HOSPITAL_BASED_OUTPATIENT_CLINIC_OR_DEPARTMENT_OTHER)
Admission: RE | Disposition: A | Discharge status: Home / Self Care | Payer: Self-pay | Source: Ambulatory Visit | Attending: Orthopedic Surgery

## 2012-07-29 ENCOUNTER — Ambulatory Visit (HOSPITAL_BASED_OUTPATIENT_CLINIC_OR_DEPARTMENT_OTHER): Payer: Worker's Compensation | Admitting: *Deleted

## 2012-07-29 ENCOUNTER — Encounter (HOSPITAL_BASED_OUTPATIENT_CLINIC_OR_DEPARTMENT_OTHER): Payer: Self-pay | Admitting: *Deleted

## 2012-07-29 ENCOUNTER — Ambulatory Visit (HOSPITAL_BASED_OUTPATIENT_CLINIC_OR_DEPARTMENT_OTHER)
Admission: RE | Admit: 2012-07-29 | Discharge: 2012-07-29 | Disposition: A | Discharge status: Home / Self Care | Payer: Worker's Compensation | Source: Ambulatory Visit | Attending: Orthopedic Surgery | Admitting: Orthopedic Surgery

## 2012-07-29 DIAGNOSIS — I129 Hypertensive chronic kidney disease with stage 1 through stage 4 chronic kidney disease, or unspecified chronic kidney disease: Secondary | ICD-10-CM | POA: Insufficient documentation

## 2012-07-29 DIAGNOSIS — I2510 Atherosclerotic heart disease of native coronary artery without angina pectoris: Secondary | ICD-10-CM | POA: Insufficient documentation

## 2012-07-29 DIAGNOSIS — I739 Peripheral vascular disease, unspecified: Secondary | ICD-10-CM | POA: Insufficient documentation

## 2012-07-29 DIAGNOSIS — S61209A Unspecified open wound of unspecified finger without damage to nail, initial encounter: Secondary | ICD-10-CM | POA: Insufficient documentation

## 2012-07-29 DIAGNOSIS — E785 Hyperlipidemia, unspecified: Secondary | ICD-10-CM | POA: Insufficient documentation

## 2012-07-29 DIAGNOSIS — N182 Chronic kidney disease, stage 2 (mild): Secondary | ICD-10-CM | POA: Insufficient documentation

## 2012-07-29 DIAGNOSIS — W269XXA Contact with unspecified sharp object(s), initial encounter: Secondary | ICD-10-CM | POA: Insufficient documentation

## 2012-07-29 DIAGNOSIS — S56129A Laceration of flexor muscle, fascia and tendon of unspecified finger at forearm level, initial encounter: Secondary | ICD-10-CM

## 2012-07-29 DIAGNOSIS — F17200 Nicotine dependence, unspecified, uncomplicated: Secondary | ICD-10-CM | POA: Insufficient documentation

## 2012-07-29 DIAGNOSIS — E119 Type 2 diabetes mellitus without complications: Secondary | ICD-10-CM | POA: Insufficient documentation

## 2012-07-29 DIAGNOSIS — IMO0002 Reserved for concepts with insufficient information to code with codable children: Secondary | ICD-10-CM | POA: Insufficient documentation

## 2012-07-29 HISTORY — PX: NERVE EXPLORATION: SHX2082

## 2012-07-29 LAB — POCT I-STAT, CHEM 8
BUN: 24 mg/dL — ABNORMAL HIGH (ref 6–23)
Calcium, Ion: 1.24 mmol/L (ref 1.13–1.30)
Chloride: 110 mEq/L (ref 96–112)
Creatinine, Ser: 1 mg/dL (ref 0.50–1.35)
Glucose, Bld: 105 mg/dL — ABNORMAL HIGH (ref 70–99)
HCT: 48 % (ref 39.0–52.0)
Hemoglobin: 16.3 g/dL (ref 13.0–17.0)
Potassium: 4 mEq/L (ref 3.5–5.1)
Sodium: 144 mEq/L (ref 135–145)
TCO2: 21 mmol/L (ref 0–100)

## 2012-07-29 LAB — GLUCOSE, CAPILLARY: Glucose-Capillary: 68 mg/dL — ABNORMAL LOW (ref 70–99)

## 2012-07-29 SURGERY — EXPLORATION, NERVE
Anesthesia: General | Site: Thumb | Laterality: Left | Wound class: Contaminated

## 2012-07-29 MED ORDER — EPHEDRINE SULFATE 50 MG/ML IJ SOLN
INTRAMUSCULAR | Status: DC | PRN
  Administered 2012-07-29 (×2): 10 mg via INTRAVENOUS

## 2012-07-29 MED ORDER — OXYCODONE-ACETAMINOPHEN 5-325 MG PO TABS: 1.0000 | ORAL_TABLET | ORAL | Status: DC | PRN

## 2012-07-29 MED ORDER — PROPOFOL 10 MG/ML IV BOLUS
INTRAVENOUS | Status: DC | PRN
  Administered 2012-07-29: 200 mg via INTRAVENOUS

## 2012-07-29 MED ORDER — FENTANYL CITRATE 0.05 MG/ML IJ SOLN
INTRAMUSCULAR | Status: DC | PRN
  Administered 2012-07-29 (×2): 50 ug via INTRAVENOUS

## 2012-07-29 MED ORDER — MIDAZOLAM HCL 5 MG/5ML IJ SOLN
INTRAMUSCULAR | Status: DC | PRN
  Administered 2012-07-29: 1 mg via INTRAVENOUS

## 2012-07-29 MED ORDER — HYDROMORPHONE HCL PF 1 MG/ML IJ SOLN: 0.2500 mg | INTRAMUSCULAR | Status: DC | PRN

## 2012-07-29 MED ORDER — CEFAZOLIN SODIUM-DEXTROSE 2-3 GM-% IV SOLR
2.0000 g | Freq: Once | INTRAVENOUS | Status: AC
  Administered 2012-07-29: 2 g via INTRAVENOUS

## 2012-07-29 MED ORDER — BUPIVACAINE HCL 0.25 % IJ SOLN
INTRAMUSCULAR | Status: DC | PRN
  Administered 2012-07-29: 6 mL

## 2012-07-29 MED ORDER — ONDANSETRON HCL 4 MG/2ML IJ SOLN: 4.0000 mg | Freq: Four times a day (QID) | INTRAMUSCULAR | Status: DC | PRN

## 2012-07-29 MED ORDER — LACTATED RINGERS IV SOLN
INTRAVENOUS | Status: DC
  Administered 2012-07-29 (×2): via INTRAVENOUS

## 2012-07-29 MED ORDER — 0.9 % SODIUM CHLORIDE (POUR BTL) OPTIME
TOPICAL | Status: DC | PRN
  Administered 2012-07-29: 100 mL

## 2012-07-29 MED ORDER — DEXAMETHASONE SODIUM PHOSPHATE 10 MG/ML IJ SOLN
INTRAMUSCULAR | Status: DC | PRN
  Administered 2012-07-29: 4 mg via INTRAVENOUS

## 2012-07-29 MED ORDER — DEXTROSE 50 % IV SOLN
25.0000 mL | Freq: Once | INTRAVENOUS | Status: AC
  Administered 2012-07-29: 25 mL via INTRAVENOUS

## 2012-07-29 MED ORDER — LIDOCAINE HCL (CARDIAC) 20 MG/ML IV SOLN
INTRAVENOUS | Status: DC | PRN
  Administered 2012-07-29: 50 mg via INTRAVENOUS

## 2012-07-29 MED ORDER — ONDANSETRON HCL 4 MG/2ML IJ SOLN
INTRAMUSCULAR | Status: DC | PRN
  Administered 2012-07-29: 4 mg via INTRAVENOUS

## 2012-07-29 SURGICAL SUPPLY — 65 items
APL SKNCLS STERI-STRIP NONHPOA (GAUZE/BANDAGES/DRESSINGS)
BAG DECANTER FOR FLEXI CONT (MISCELLANEOUS) IMPLANT
BANDAGE ELASTIC 4 VELCRO ST LF (GAUZE/BANDAGES/DRESSINGS) ×2 IMPLANT
BENZOIN TINCTURE PRP APPL 2/3 (GAUZE/BANDAGES/DRESSINGS) IMPLANT
BLADE MINI RND TIP GREEN BEAV (BLADE) IMPLANT
BLADE SURG 15 STRL LF DISP TIS (BLADE) ×1 IMPLANT
BLADE SURG 15 STRL SS (BLADE) ×2
BNDG CMPR 9X4 STRL LF SNTH (GAUZE/BANDAGES/DRESSINGS) ×1
BNDG CMPR MD 5X2 ELC HKLP STRL (GAUZE/BANDAGES/DRESSINGS) ×1
BNDG COHESIVE 1X5 TAN STRL LF (GAUZE/BANDAGES/DRESSINGS) IMPLANT
BNDG ELASTIC 2 VLCR STRL LF (GAUZE/BANDAGES/DRESSINGS) ×2 IMPLANT
BNDG ESMARK 4X9 LF (GAUZE/BANDAGES/DRESSINGS) ×2 IMPLANT
BRUSH SCRUB EZ PLAIN DRY (MISCELLANEOUS) ×2 IMPLANT
CLOTH BEACON ORANGE TIMEOUT ST (SAFETY) ×2 IMPLANT
CORDS BIPOLAR (ELECTRODE) ×2 IMPLANT
COVER TABLE BACK 60X90 (DRAPES) ×2 IMPLANT
CUFF TOURNIQUET SINGLE 18IN (TOURNIQUET CUFF) ×2 IMPLANT
DECANTER SPIKE VIAL GLASS SM (MISCELLANEOUS) IMPLANT
DRAPE EXTREMITY T 121X128X90 (DRAPE) ×2 IMPLANT
DRAPE SURG 17X23 STRL (DRAPES) ×2 IMPLANT
DURAPREP 26ML APPLICATOR (WOUND CARE) ×2 IMPLANT
GAUZE SPONGE 4X4 16PLY XRAY LF (GAUZE/BANDAGES/DRESSINGS) IMPLANT
GAUZE XEROFORM 1X8 LF (GAUZE/BANDAGES/DRESSINGS) ×2 IMPLANT
GLOVE BIO SURGEON STRL SZ8 (GLOVE) ×2 IMPLANT
GLOVE BIO SURGEON STRL SZ8.5 (GLOVE) ×2 IMPLANT
GLOVE BIOGEL M STRL SZ7.5 (GLOVE) ×2 IMPLANT
GLOVE BIOGEL PI IND STRL 8 (GLOVE) ×2 IMPLANT
GLOVE BIOGEL PI IND STRL 8.5 (GLOVE) ×1 IMPLANT
GLOVE BIOGEL PI INDICATOR 8 (GLOVE) ×2
GLOVE BIOGEL PI INDICATOR 8.5 (GLOVE) ×1
GOWN PREVENTION PLUS XLARGE (GOWN DISPOSABLE) IMPLANT
GOWN PREVENTION PLUS XXLARGE (GOWN DISPOSABLE) ×4 IMPLANT
IV LACTATED RINGERS 500ML (IV SOLUTION) IMPLANT
NEEDLE HYPO 22GX1.5 SAFETY (NEEDLE) IMPLANT
NEEDLE HYPO 25X1 1.5 SAFETY (NEEDLE) ×4 IMPLANT
NS IRRIG 1000ML POUR BTL (IV SOLUTION) ×2 IMPLANT
PACK BASIN DAY SURGERY FS (CUSTOM PROCEDURE TRAY) ×2 IMPLANT
PAD CAST 3X4 CTTN HI CHSV (CAST SUPPLIES) ×1 IMPLANT
PADDING CAST ABS 4INX4YD NS (CAST SUPPLIES)
PADDING CAST ABS COTTON 4X4 ST (CAST SUPPLIES) IMPLANT
PADDING CAST COTTON 3X4 STRL (CAST SUPPLIES) ×2
PADDING UNDERCAST 2  STERILE (CAST SUPPLIES) ×2 IMPLANT
PASSER SUT SWANSON 36MM LOOP (INSTRUMENTS) IMPLANT
SHEET MEDIUM DRAPE 40X70 STRL (DRAPES) ×2 IMPLANT
SLEEVE SCD COMPRESS KNEE MED (MISCELLANEOUS) ×2 IMPLANT
SPEAR EYE SURG WECK-CEL (MISCELLANEOUS) ×2 IMPLANT
SPLINT PLASTER CAST XFAST 4X15 (CAST SUPPLIES) ×15 IMPLANT
SPLINT PLASTER XTRA FAST SET 4 (CAST SUPPLIES) ×15
SPONGE GAUZE 4X4 12PLY (GAUZE/BANDAGES/DRESSINGS) ×2 IMPLANT
STOCKINETTE 4X48 STRL (DRAPES) ×2 IMPLANT
STRIP CLOSURE SKIN 1/2X4 (GAUZE/BANDAGES/DRESSINGS) IMPLANT
SUT ETHIBOND 3-0 V-5 (SUTURE) ×4 IMPLANT
SUT ETHILON 5 0 PS 2 18 (SUTURE) IMPLANT
SUT NYLON 9 0 VRM6 (SUTURE) ×2 IMPLANT
SUT PROLENE 3 0 PS 2 (SUTURE) IMPLANT
SUT PROLENE 6 0 P 1 18 (SUTURE) IMPLANT
SUT PROLENE 6 0 PC 1 (SUTURE) ×2 IMPLANT
SUT SILK 4 0 PS 2 (SUTURE) ×2 IMPLANT
SUT VICRYL RAPIDE 4/0 PS 2 (SUTURE) ×2 IMPLANT
SYR BULB 3OZ (MISCELLANEOUS) ×2 IMPLANT
SYR CONTROL 10ML LL (SYRINGE) ×2 IMPLANT
TOWEL OR 17X24 6PK STRL BLUE (TOWEL DISPOSABLE) ×2 IMPLANT
TRAY DSU PREP LF (CUSTOM PROCEDURE TRAY) ×2 IMPLANT
TUBE FEEDING 5FR 15 INCH (TUBING) IMPLANT
UNDERPAD 30X30 INCONTINENT (UNDERPADS AND DIAPERS) ×2 IMPLANT

## 2012-07-29 NOTE — Anesthesia Procedure Notes (Signed)
Procedure Name: LMA Insertion Date/Time: 07/29/2012 1:32 PM Performed by: Melynda Ripple D Pre-anesthesia Checklist: Patient identified, Emergency Drugs available, Suction available, Patient being monitored and Timeout performed Patient Re-evaluated:Patient Re-evaluated prior to inductionOxygen Delivery Method: Circle System Utilized Preoxygenation: Pre-oxygenation with 100% oxygen Intubation Type: IV induction Ventilation: Mask ventilation without difficulty LMA: LMA inserted LMA Size: 5.0 Number of attempts: 1 Airway Equipment and Method: bite block Placement Confirmation: positive ETCO2 Tube secured with: Tape Dental Injury: Teeth and Oropharynx as per pre-operative assessment

## 2012-07-29 NOTE — Anesthesia Postprocedure Evaluation (Signed)
Anesthesia Post Note  Patient: Travis [name]  Procedure(s) Performed: Procedure(s) (LRB): NERVE EXPLORATION (Left) NERVE AND TENDON REPAIR (Left)  Anesthesia type: General  Patient location: PACU  Post pain: Pain level controlled and Adequate analgesia  Post assessment: Post-op Vital signs reviewed, Patient's Cardiovascular Status Stable, Respiratory Function Stable, Patent Airway and Pain level controlled  Last Vitals:  Filed Vitals:   07/29/12 1515  BP: 144/53  Pulse: 64  Temp:   Resp: 16    Post vital signs: Reviewed and stable  Level of consciousness: awake, alert  and oriented  Complications: No apparent anesthesia complications

## 2012-07-29 NOTE — H&P (Signed)
Travis [name] is an 61 y.o. male.   Chief Complaint: left thumb laceration HPI: as above with left thumb volar lac with loss of flexion and sensation  Past Medical History  Diagnosis Date  . Diabetes mellitus     type 2  . Peripheral vascular disease   . Hypertension   . Hyperlipemia   . Kidney disease     chronic; stage 2  . Osteoarthritis   . Tobacco abuse   . Coronary artery disease     PCA 1994l; PCI august 2003 of an obtust marginal. Catheterization at the time reveal: left main normal; LAD 50-75% between first and second diagonals, 50% after lesion; left circumflex 95% in obtuse marg. RCA 100% proximal.    Past Surgical History  Procedure Date  . Appendectomy   . Cholecystectomy   . Coronary angioplasty     stent times 2, last cath 3 years ago    Family History  Problem Relation Age of Onset  . Coronary artery disease Mother   . Cancer Father   . Stroke Other   . Heart failure Other   . Pulmonary embolism Other    Social History:  reports that he has been smoking.  He does not have any smokeless tobacco history on file. He reports that he does not drink alcohol or use illicit drugs.  Allergies:  Allergies  Allergen Reactions  . Penicillins     Medications Prior to Admission  Medication Sig Dispense Refill  . amLODipine (NORVASC) 10 MG tablet Take 10 mg by mouth daily.        [name] aspirin (ASPIR-81) 81 MG EC tablet Take 81 mg by mouth daily.        . benazepril (LOTENSIN) 40 MG tablet Take 40 mg by mouth daily.        . clopidogrel (PLAVIX) 75 MG tablet TAKE 1 TABLET (75 MG TOTAL) BY MOUTH DAILY.  30 tablet  6  . fenofibrate 54 MG tablet Take 54 mg by mouth daily.        . furosemide (LASIX) 80 MG tablet Take 80 mg by mouth 2 (two) times daily.        [name] glipiZIDE (GLUCOTROL) 10 MG 24 hr tablet Take 10 mg by mouth daily.        [name] losartan (COZAAR) 100 MG tablet Take 100 mg by mouth daily.        . metFORMIN (GLUCOPHAGE) 1000 MG tablet Take 1,000 mg by mouth 2 (two)  times daily with a meal.        . metoprolol (LOPRESSOR) 100 MG tablet Take 100 mg by mouth 2 (two) times daily.        [name] omega-3 acid ethyl esters (LOVAZA) 1 G capsule Take by mouth daily.        . rosuvastatin (CRESTOR) 10 MG tablet Take 5 mg by mouth. 1/2 po daily      . sitaGLIPtan (JANUVIA) 50 MG tablet Take 50 mg by mouth daily.        . vitamin B-12 (CYANOCOBALAMIN) 500 MCG tablet Take 500 mcg by mouth daily.          Results for orders placed during the hospital encounter of 07/29/12 (from the past 48 hour(s))  POCT I-STAT, CHEM 8     Status: Abnormal   Collection Time   07/29/12 12:44 PM      Component Value Range Comment   Sodium 144  135 - 145 mEq/L    Potassium  4.0  3.5 - 5.1 mEq/L    Chloride 110  96 - 112 mEq/L    BUN 24 (*) 6 - 23 mg/dL    Creatinine, Ser 1.00  0.50 - 1.35 mg/dL    Glucose, Bld 105 (*) 70 - 99 mg/dL    Calcium, Ion 1.24  1.13 - 1.30 mmol/L    TCO2 21  0 - 100 mmol/L    Hemoglobin 16.3  13.0 - 17.0 g/dL    HCT 48.0  39.0 - 52.0 %    No results found.  Review of Systems  All other systems reviewed and are negative.    Blood pressure 172/97, pulse 67, temperature 97.9 F (36.6 C), temperature source Oral, resp. rate 18, height 6' (1.829 m), weight 91.286 kg (201 lb 4 oz), SpO2 96.00%. Physical Exam  Constitutional: He is oriented to person, place, and time. He appears well-developed and well-nourished.  HENT:  Head: Normocephalic and atraumatic.  Cardiovascular: Normal rate.   Respiratory: Effort normal.  Musculoskeletal:       Left hand: He exhibits disruption of two-point discrimination and laceration.       Hands: Neurological: He is alert and oriented to person, place, and time.  Skin: Skin is warm.  Psychiatric: He has a normal mood and affect. His behavior is normal. Judgment and thought content normal.     Assessment/Plan As above  Plan explore and repair as needed  Deyona Soza A 07/29/2012, 1:11 PM

## 2012-07-29 NOTE — Transfer of Care (Signed)
Immediate Anesthesia Transfer of Care Note  Patient: Travis [name]  Procedure(s) Performed: Procedure(s) (LRB) with comments: NERVE EXPLORATION (Left) - Exploration Elly Modena nerve and tendon left thumb NERVE AND TENDON REPAIR (Left)  Patient Location: PACU  Anesthesia Type: General  Level of Consciousness: awake, alert  and oriented  Airway & Oxygen Therapy: Patient Spontanous Breathing and Patient connected to face mask oxygen  Post-op Assessment: Report given to PACU RN and Post -op Vital signs reviewed and stable  Post vital signs: Reviewed and stable  Complications: No apparent anesthesia complications

## 2012-07-29 NOTE — Op Note (Signed)
See note [id number]

## 2012-07-29 NOTE — Anesthesia Preprocedure Evaluation (Addendum)
Anesthesia Evaluation Anesthesia Physical Anesthesia Plan Anesthesia Quick Evaluation  

## 2012-07-29 NOTE — Brief Op Note (Signed)
07/29/2012  2:49 PM  PATIENT:  Odis Luster  61 y.o. male  PRE-OPERATIVE DIAGNOSIS:  Laceration left thumb  POST-OPERATIVE DIAGNOSIS:  Laceration left thumb  PROCEDURE:  Procedure(s) (LRB) with comments: NERVE EXPLORATION (Left) - Exploration Elly Modena nerve and tendon left thumb NERVE AND TENDON REPAIR (Left)  SURGEON:  Surgeon(s) and Role:    * Schuyler Amor, MD - Primary  PHYSICIAN ASSISTANT:   ASSISTANTS: none   ANESTHESIA:   general  EBL:  Total I/O In: 1100 [I.V.:1100] Out: -   BLOOD ADMINISTERED:none  DRAINS: none   LOCAL MEDICATIONS USED:  MARCAINE   6cc  SPECIMEN:  No Specimen  DISPOSITION OF SPECIMEN:  N/A  COUNTS:  YES  TOURNIQUET:   Total Tourniquet Time Documented: Upper Arm (Left) - 58 minutes  DICTATION: .Other Dictation: Dictation Number [id number]  PLAN OF CARE: Discharge to home after PACU  PATIENT DISPOSITION:  PACU - hemodynamically stable.   Delay start of Pharmacological VTE agent (>24hrs) due to surgical blood loss or risk of bleeding: not applicable

## 2012-07-30 ENCOUNTER — Encounter (HOSPITAL_BASED_OUTPATIENT_CLINIC_OR_DEPARTMENT_OTHER): Payer: Self-pay | Admitting: Orthopedic Surgery

## 2012-07-30 LAB — GLUCOSE, CAPILLARY: Glucose-Capillary: 115 mg/dL — ABNORMAL HIGH (ref 70–99)

## 2012-08-03 NOTE — Op Note (Signed)
NAME:  Travis [name], Travis [name]                   ACCOUNT NO.:  [id number]  MEDICAL RECORD NO.:  LOCATION:                                 FACILITY:  PHYSICIAN:  Author Hatlestad A. Sharmin Foulk, M.D.DATE OF BIRTH:  [birth year]  DATE OF PROCEDURE:  07/29/2012 DATE OF DISCHARGE:                              OPERATIVE REPORT   PREOPERATIVE DIAGNOSIS:  Deep laceration on radial aspect of left thumb MP joint flexion crease.  POSTOPERATIVE DIAGNOSIS:  Deep laceration on radial aspect of left thumb MP joint flexion crease.  PROCEDURE:  Exploration and repair of FPL tendon and radial digital nerve microscopically.  SURGEON:  Sheral Apley. Burney Gauze, M.D.  ASSISTANT:  None.  ANESTHESIA:  General.  TOURNIQUET TIME:  56 minutes.  COMPLICATIONS:  No complications.  DRAINS:  No drains.  DESCRIPTION OF PROCEDURE:  The patient was taken to the operating suite. After induction of adequate general anesthesia, the left upper extremity was prepped and draped in sterile fashion.  An Esmarch was used to exsanguinate the limb.  Tourniquet was inflated to 275 mmHg.  At this point in time, an oblique laceration on the MP flexion crease that extended midline ulnarly and midline radially. Flaps were raised. Dissection was carried down to the flexor sheath.  There was a complete laceration of the FPL tendon in the area of the A1. Just distal to the A1 pulley, the distal aspect of the tendon was identified. There was complete laceration of the radial digital nerve.  The artery was intact. The FPL tendon was found at the base of the thenar muscles, proximally drawn through the pulley and then sutured to the distal stump using 3-0 Ethibond and Tajima suture, followed by the same 3-0 Ethibond and a horizontal mattress core suture, followed by 6-0 Prolene epitendinous suture.  Once this was done, the microscope was brought onto the field. Under microscopic magnification, the radial digital nerve was repaired with 9-0 nylon x4  sutures.  The wound was then irrigated and loosely closed with 4-0 Vicryl Rapide.  Xeroform, 4x4s, a compression wrap, as well as a dorsal extension block splint was applied. The patient tolerated the procedure well and went to the recovery room in a stable fashion.     Sheral Apley Burney Gauze, M.D.     MAW/MEDQ  D:  07/29/2012  T:  07/30/2012  Job:  [id number]

## 2012-08-26 NOTE — Addendum Note (Signed)
Addended by: Janne Napoleon on: 08/26/2012 03:51 PM   Modules accepted: Orders

## 2012-12-07 ENCOUNTER — Other Ambulatory Visit: Payer: Self-pay

## 2012-12-07 MED ORDER — CLOPIDOGREL BISULFATE 75 MG PO TABS: 75.0000 mg | ORAL_TABLET | Freq: Every day | ORAL | Status: DC

## 2012-12-28 ENCOUNTER — Ambulatory Visit: Payer: Self-pay | Admitting: Cardiology

## 2013-01-11 ENCOUNTER — Encounter: Payer: Self-pay | Admitting: Cardiology

## 2013-01-11 ENCOUNTER — Ambulatory Visit (INDEPENDENT_AMBULATORY_CARE_PROVIDER_SITE_OTHER): Payer: Managed Care, Other (non HMO) | Admitting: Cardiology

## 2013-01-11 VITALS — BP 164/70 | HR 65 | Ht 72.0 in | Wt 210.0 lb

## 2013-01-11 DIAGNOSIS — F17200 Nicotine dependence, unspecified, uncomplicated: Secondary | ICD-10-CM

## 2013-01-11 DIAGNOSIS — E7800 Pure hypercholesterolemia, unspecified: Secondary | ICD-10-CM

## 2013-01-11 DIAGNOSIS — I739 Peripheral vascular disease, unspecified: Secondary | ICD-10-CM

## 2013-01-11 DIAGNOSIS — I10 Essential (primary) hypertension: Secondary | ICD-10-CM

## 2013-01-11 DIAGNOSIS — I2510 Atherosclerotic heart disease of native coronary artery without angina pectoris: Secondary | ICD-10-CM

## 2013-01-11 NOTE — Patient Instructions (Addendum)
Your physician wants you to follow-up in:  6 months with Dr. Angelena Form (previous pt of Dr Lia Foyer). You will receive a reminder letter in the mail two months in advance. If you don't receive a letter, please call our office to schedule the follow-up appointment.  Your physician has requested that you have a carotid duplex. This test is an ultrasound of the carotid arteries in your neck. It looks at blood flow through these arteries that supply the brain with blood. Allow one hour for this exam. There are no restrictions or special instructions.  Your physician has requested that you have a lower extremity arterial duplex. This test is an ultrasound of the arteries in the legs. It looks at arterial blood flow in the legs. Allow one hour for Lower Arterial scans. There are no restrictions or special instructions  Your physician recommends that you continue on your current medications as directed. Please refer to the Current Medication list given to you today.

## 2013-01-11 NOTE — Assessment & Plan Note (Signed)
Discussed.  He is trying the E-cigarette.

## 2013-01-11 NOTE — Progress Notes (Signed)
HPI:  He is in for follow up.  No major complaints.  Still smokes.  No advancement of claudication.  BPs run in the 150 range.    Current Outpatient Prescriptions  Medication Sig Dispense Refill  . amLODipine (NORVASC) 10 MG tablet Take 10 mg by mouth daily.        [name] aspirin (ASPIR-81) 81 MG EC tablet Take 81 mg by mouth daily.        . benazepril (LOTENSIN) 40 MG tablet Take 40 mg by mouth 2 (two) times daily.       . clopidogrel (PLAVIX) 75 MG tablet Take 1 tablet (75 mg total) by mouth daily.  30 tablet  6  . fenofibrate 54 MG tablet Take 54 mg by mouth daily.        . furosemide (LASIX) 80 MG tablet Take 80 mg by mouth 2 (two) times daily.        [name] glipiZIDE (GLUCOTROL) 10 MG 24 hr tablet Take 5 mg by mouth daily.       [name] losartan (COZAAR) 100 MG tablet Take 100 mg by mouth 2 (two) times daily.       . metFORMIN (GLUCOPHAGE) 1000 MG tablet Take 1,000 mg by mouth 2 (two) times daily with a meal.        . metoprolol (LOPRESSOR) 100 MG tablet Take 100 mg by mouth 2 (two) times daily.        [name] omega-3 acid ethyl esters (LOVAZA) 1 G capsule Take 1 g by mouth 2 (two) times daily.       [name] oxyCODONE-acetaminophen (ROXICET) 5-325 MG per tablet Take 1 tablet by mouth every 4 (four) hours as needed for pain.  30 tablet  0  . rosuvastatin (CRESTOR) 10 MG tablet Take 5 mg by mouth daily.       . sitaGLIPtan (JANUVIA) 50 MG tablet Take 50 mg by mouth daily.        . vitamin B-12 (CYANOCOBALAMIN) 500 MCG tablet Take 500 mcg by mouth daily.         No current facility-administered medications for this visit.    Allergies  Allergen Reactions  . Penicillins     Past Medical History  Diagnosis Date  . Diabetes mellitus     type 2  . Peripheral vascular disease   . Hypertension   . Hyperlipemia   . Kidney disease     chronic; stage 2  . Osteoarthritis   . Tobacco abuse   . Coronary artery disease     PCA 1994l; PCI august 2003 of an obtust marginal. Catheterization at the time reveal:  left main normal; LAD 50-75% between first and second diagonals, 50% after lesion; left circumflex 95% in obtuse marg. RCA 100% proximal.    Past Surgical History  Procedure Laterality Date  . Appendectomy    . Cholecystectomy    . Coronary angioplasty      stent times 2, last cath 3 years ago  . Nerve exploration  07/29/2012    Procedure: NERVE EXPLORATION;  Surgeon: Schuyler Amor, MD;  Location: [facility];  Service: Orthopedics;  Laterality: Left;  Exploration /repair nerve and tendon left thumb    Family History  Problem Relation Age of Onset  . Coronary artery disease Mother   . Cancer Father   . Stroke Other   . Heart failure Other   . Pulmonary embolism Other     History   Social History  . Marital  Status: Married    Spouse Name: N/A    Number of Children: N/A  . Years of Education: N/A   Occupational History  . Not on file.   Social History Main Topics  . Smoking status: Current Every Day Smoker -- 0.50 packs/day  . Smokeless tobacco: Not on file  . Alcohol Use: No  . Drug Use: No  . Sexually Active: Not on file   Other Topics Concern  . Not on file   Social History Narrative   Has a 40-pack-year smoking history and currently smokes 1/2 pack per day.     ROS: Please see the HPI.  All other systems reviewed and negative.  PHYSICAL EXAM:  BP 164/70  Pulse 65  Ht 6' (1.829 m)  Wt 210 lb (95.255 kg)  BMI 28.47 kg/m2  SpO2 99%  General: Well developed, well nourished, in no acute distress. Head:  Normocephalic and atraumatic. Neck: no JVD.  ?soft left carotid bruit Lungs: Clear to auscultation and percussion. Heart: Normal S1 and S2.  No murmur, rubs or gallops.  Abdomen:  Normal bowel sounds; soft; non tender; no organomegaly Pulses: bifemoral bruits.  Diminished pulses in the feet.   Extremities: No clubbing or cyanosis. No edema. Neurologic: Alert and oriented x 3.  EKG:  SB.  Occasional VPB.  Nonspecific ST change.     ASSESSMENT AND PLAN:

## 2013-01-11 NOTE — Assessment & Plan Note (Signed)
Needs to recheck ABI. And also carotids.  (smoking diabetic)

## 2013-01-11 NOTE — Assessment & Plan Note (Signed)
Has Cypher stents in both the LAD and LCX.  Has total RCA.  Of note, has remained on plavix, as he also has DM, and also has multiple risks.  Issue of guidelines vs. Continued plavix use discussed as he is a higher risk patient.

## 2013-01-11 NOTE — Assessment & Plan Note (Signed)
Statin intolerant.  Dr. Joylene Draft has him on a low dose

## 2013-01-11 NOTE — Assessment & Plan Note (Signed)
Remains in the 150 range at home.  Will defer to Dr. Jimmy Footman.

## 2013-01-22 ENCOUNTER — Encounter (INDEPENDENT_AMBULATORY_CARE_PROVIDER_SITE_OTHER): Payer: Managed Care, Other (non HMO)

## 2013-01-22 DIAGNOSIS — I2510 Atherosclerotic heart disease of native coronary artery without angina pectoris: Secondary | ICD-10-CM

## 2013-01-22 DIAGNOSIS — I6529 Occlusion and stenosis of unspecified carotid artery: Secondary | ICD-10-CM

## 2013-01-26 ENCOUNTER — Encounter (INDEPENDENT_AMBULATORY_CARE_PROVIDER_SITE_OTHER): Payer: Managed Care, Other (non HMO)

## 2013-01-26 DIAGNOSIS — I739 Peripheral vascular disease, unspecified: Secondary | ICD-10-CM

## 2013-01-26 DIAGNOSIS — I70219 Atherosclerosis of native arteries of extremities with intermittent claudication, unspecified extremity: Secondary | ICD-10-CM

## 2013-03-05 ENCOUNTER — Encounter (HOSPITAL_COMMUNITY): Payer: Self-pay | Admitting: Family Medicine

## 2013-03-05 ENCOUNTER — Emergency Department (HOSPITAL_COMMUNITY)
Admission: EM | Admit: 2013-03-05 | Discharge: 2013-03-05 | Disposition: A | Discharge status: Home / Self Care | Payer: Worker's Compensation | Attending: Emergency Medicine | Admitting: Emergency Medicine

## 2013-03-05 ENCOUNTER — Emergency Department (HOSPITAL_COMMUNITY): Payer: Worker's Compensation

## 2013-03-05 DIAGNOSIS — N182 Chronic kidney disease, stage 2 (mild): Secondary | ICD-10-CM | POA: Insufficient documentation

## 2013-03-05 DIAGNOSIS — Z79899 Other long term (current) drug therapy: Secondary | ICD-10-CM | POA: Insufficient documentation

## 2013-03-05 DIAGNOSIS — M1990 Unspecified osteoarthritis, unspecified site: Secondary | ICD-10-CM | POA: Insufficient documentation

## 2013-03-05 DIAGNOSIS — Y838 Other surgical procedures as the cause of abnormal reaction of the patient, or of later complication, without mention of misadventure at the time of the procedure: Secondary | ICD-10-CM | POA: Insufficient documentation

## 2013-03-05 DIAGNOSIS — I2510 Atherosclerotic heart disease of native coronary artery without angina pectoris: Secondary | ICD-10-CM | POA: Insufficient documentation

## 2013-03-05 DIAGNOSIS — E119 Type 2 diabetes mellitus without complications: Secondary | ICD-10-CM | POA: Insufficient documentation

## 2013-03-05 DIAGNOSIS — E785 Hyperlipidemia, unspecified: Secondary | ICD-10-CM | POA: Insufficient documentation

## 2013-03-05 DIAGNOSIS — Z9861 Coronary angioplasty status: Secondary | ICD-10-CM | POA: Insufficient documentation

## 2013-03-05 DIAGNOSIS — R209 Unspecified disturbances of skin sensation: Secondary | ICD-10-CM | POA: Insufficient documentation

## 2013-03-05 DIAGNOSIS — I739 Peripheral vascular disease, unspecified: Secondary | ICD-10-CM | POA: Insufficient documentation

## 2013-03-05 DIAGNOSIS — T8140XA Infection following a procedure, unspecified, initial encounter: Secondary | ICD-10-CM | POA: Insufficient documentation

## 2013-03-05 DIAGNOSIS — Z7902 Long term (current) use of antithrombotics/antiplatelets: Secondary | ICD-10-CM | POA: Insufficient documentation

## 2013-03-05 DIAGNOSIS — M7989 Other specified soft tissue disorders: Secondary | ICD-10-CM | POA: Insufficient documentation

## 2013-03-05 DIAGNOSIS — I129 Hypertensive chronic kidney disease with stage 1 through stage 4 chronic kidney disease, or unspecified chronic kidney disease: Secondary | ICD-10-CM | POA: Insufficient documentation

## 2013-03-05 DIAGNOSIS — F17200 Nicotine dependence, unspecified, uncomplicated: Secondary | ICD-10-CM | POA: Insufficient documentation

## 2013-03-05 DIAGNOSIS — M79609 Pain in unspecified limb: Secondary | ICD-10-CM | POA: Insufficient documentation

## 2013-03-05 DIAGNOSIS — Z7982 Long term (current) use of aspirin: Secondary | ICD-10-CM | POA: Insufficient documentation

## 2013-03-05 LAB — BASIC METABOLIC PANEL
CO2: 26 mEq/L (ref 19–32)
Calcium: 9.6 mg/dL (ref 8.4–10.5)
Glucose, Bld: 76 mg/dL (ref 70–99)
Potassium: 3.8 mEq/L (ref 3.5–5.1)
Sodium: 140 mEq/L (ref 135–145)

## 2013-03-05 LAB — CBC WITH DIFFERENTIAL/PLATELET
Basophils Absolute: 0.1 10*3/uL (ref 0.0–0.1)
Eosinophils Relative: 2 % (ref 0–5)
Lymphocytes Relative: 22 % (ref 12–46)
Lymphs Abs: 2.1 10*3/uL (ref 0.7–4.0)
Neutro Abs: 6.6 10*3/uL (ref 1.7–7.7)
Neutrophils Relative %: 67 % (ref 43–77)
Platelets: 170 10*3/uL (ref 150–400)
RBC: 5.19 MIL/uL (ref 4.22–5.81)
RDW: 14.2 % (ref 11.5–15.5)
WBC: 9.9 10*3/uL (ref 4.0–10.5)

## 2013-03-05 MED ORDER — VANCOMYCIN HCL IN DEXTROSE 1-5 GM/200ML-% IV SOLN
1000.0000 mg | Freq: Once | INTRAVENOUS | Status: AC
  Administered 2013-03-05: 1000 mg via INTRAVENOUS
  Filled 2013-03-05: qty 200

## 2013-03-05 MED ORDER — SULFAMETHOXAZOLE-TRIMETHOPRIM 800-160 MG PO TABS: 1.0000 | ORAL_TABLET | Freq: Two times a day (BID) | ORAL | Status: DC

## 2013-03-05 NOTE — Consult Note (Signed)
Called to see patient for left thumb swelling times 24 hours w/o trauma or penetrating injjury  Left thenar area swollen and tender  Denies fever chills etc  WBC 9.9   Have discussed with patient and wife   Will plan 1 gram IV Vancomycin now and d/c to home on po bactrim  Will reevaluate tomorrow in my office

## 2013-03-05 NOTE — ED Notes (Signed)
Lab at bedside

## 2013-03-05 NOTE — ED Notes (Signed)
Per pt recent surgery on left hand and now area is very red, swollen and tender. Limited movement.

## 2013-03-05 NOTE — ED Provider Notes (Signed)
History    This chart was scribed for Travis Burke PA-C, a non-physician practitioner working with Travis Essex, MD by Travis [name], ED Scribe. This patient was seen in room TR09C/TR09C and the patient's care was started at [facility].     CSN: [id number]  Arrival date & time 03/05/13  1610   First MD Initiated Contact with Patient 03/05/13 1857      Chief Complaint  Patient presents with  . Wound Infection    (Consider location/radiation/quality/duration/timing/severity/associated sxs/prior treatment) HPI Travis [name] is a 62 y.o. male who presents to the Emergency Department complaining of worsening moderate left hand pain, redness, and swelling onset 2 days. Pt reports numbness and tingling (normal for him) since orthopedic surgery with Dr. Burney [name]. Pt reports pain as "pressure". Pt denies fever, nausea, vomiting, and chills. Pt reports pain is aggravated with movement and alleviated by nothing. Pt denies taking any medications at home to treat symptoms. Per pt last surgery on his left hand was February 17th 2014 with Dr. Burney [name].    Past Medical History  Diagnosis Date  . Diabetes mellitus     type 2  . Peripheral vascular disease   . Hypertension   . Hyperlipemia   . Kidney disease     chronic; stage 2  . Osteoarthritis   . Tobacco abuse   . Coronary artery disease     PCA 1994l; PCI august 2003 of an obtust marginal. Catheterization at the time reveal: left main normal; LAD 50-75% between first and second diagonals, 50% after lesion; left circumflex 95% in obtuse marg. RCA 100% proximal.    Past Surgical History  Procedure Laterality Date  . Appendectomy    . Cholecystectomy    . Coronary angioplasty      stent times 2, last cath 3 years ago  . Nerve exploration  07/29/2012    Procedure: NERVE EXPLORATION;  Surgeon: Travis Amor, MD;  Location: [facility];  Service: Orthopedics;  Laterality: Left;  Exploration /repair nerve and tendon left  thumb    Family History  Problem Relation Age of Onset  . Coronary artery disease Mother   . Cancer Father   . Stroke Other   . Heart failure Other   . Pulmonary embolism Other     History  Substance Use Topics  . Smoking status: Current Every Day Smoker -- 0.50 packs/day  . Smokeless tobacco: Not on file  . Alcohol Use: No      Review of Systems A complete 10 system review of systems was obtained and all systems are negative except as noted in the HPI and PMH.    Allergies  Penicillins  Home Medications   Current Outpatient Rx  Name  Route  Sig  Dispense  Refill  . amLODipine (NORVASC) 10 MG tablet   Oral   Take 10 mg by mouth daily.           [name] aspirin (ASPIR-81) 81 MG EC tablet   Oral   Take 81 mg by mouth daily.           . benazepril (LOTENSIN) 40 MG tablet   Oral   Take 40 mg by mouth 2 (two) times daily.          . clopidogrel (PLAVIX) 75 MG tablet   Oral   Take 1 tablet (75 mg total) by mouth daily.   30 tablet   6   . fenofibrate 54 MG tablet   Oral   Take  54 mg by mouth daily.           . furosemide (LASIX) 80 MG tablet   Oral   Take 80 mg by mouth 2 (two) times daily.           [name] glipiZIDE (GLUCOTROL) 10 MG 24 hr tablet   Oral   Take 5 mg by mouth daily.          [name] losartan (COZAAR) 100 MG tablet   Oral   Take 100 mg by mouth 2 (two) times daily.          . metFORMIN (GLUCOPHAGE) 1000 MG tablet   Oral   Take 1,000 mg by mouth 2 (two) times daily with a meal.           . metoprolol (LOPRESSOR) 100 MG tablet   Oral   Take 100 mg by mouth 2 (two) times daily.           [name] omega-3 acid ethyl esters (LOVAZA) 1 G capsule   Oral   Take 1 g by mouth 2 (two) times daily.          [name] oxyCODONE-acetaminophen (ROXICET) 5-325 MG per tablet   Oral   Take 1 tablet by mouth every 4 (four) hours as needed for pain.   30 tablet   0   . rosuvastatin (CRESTOR) 10 MG tablet   Oral   Take 5 mg by mouth daily.          .  sitaGLIPtan (JANUVIA) 50 MG tablet   Oral   Take 50 mg by mouth daily.           . vitamin B-12 (CYANOCOBALAMIN) 500 MCG tablet   Oral   Take 500 mcg by mouth daily.             BP 158/65  Pulse 62  Temp(Src) 98.6 F (37 C)  Resp 18  SpO2 95%  Physical Exam  Nursing note and vitals reviewed. Constitutional: He is oriented to person, place, and time. He appears well-developed and well-nourished. No distress.  HENT:  Head: Normocephalic and atraumatic.  Right Ear: External ear normal.  Left Ear: External ear normal.  Nose: Nose normal.  Eyes: Conjunctivae are normal.  Neck: Normal range of motion. No tracheal deviation present.  Cardiovascular: Normal rate, regular rhythm, normal heart sounds and intact distal pulses.   Cap refill < 3 seconds  Pulmonary/Chest: Effort normal and breath sounds normal. No stridor.  Abdominal: Soft. He exhibits no distension. There is no tenderness.  Musculoskeletal: Normal range of motion.  Swelling of left thenar eminence with erythema, limited ROM in thumb. Neurovascularly intact.   Neurological: He is alert and oriented to person, place, and time.  Skin: Skin is warm and dry. He is not diaphoretic.  Psychiatric: He has a normal mood and affect. His behavior is normal.    ED Course  Procedures (including critical care time) Medications  vancomycin (VANCOCIN) IVPB 1000 mg/200 mL premix (1,000 mg Intravenous New Bag/Given 03/05/13 2115)   9:24 PM Dr. Wyvonnia [name] consulted with Dr. Burney [name] and plan is to d/c with bactrim after vancomycin is infused.   Labs Reviewed  BASIC METABOLIC PANEL - Abnormal; Notable for the following:    BUN 30 (*)    GFR calc non Af Amer 56 (*)    GFR calc Af Amer 65 (*)    All other components within normal limits  CBC WITH DIFFERENTIAL   Dg Hand Complete Left  03/05/2013  *RADIOLOGY REPORT*  Clinical Data: 62 year old male with pain, redness and swelling at the first MTP joint and thumb.  Recent surgical  tendon repair.  LEFT HAND - COMPLETE 3+ VIEW  Comparison: None  Findings: There is no evidence of fracture, subluxation or dislocation. Mild degenerative changes of the first MTP joint noted. No findings identified to suggest osteomyelitis. No unexpected radiopaque foreign bodies are present.  IMPRESSION: No evidence of acute bony abnormality.  Mild degenerative changes the first MTP joint.   Original Report Authenticated By: Margarette Canada, M.D.      1. Wound infection after surgery, initial encounter       MDM  Patient presents with increased swelling of his left thenar eminence post surgery. Dr. Burney [name] was consulted and believes he does not need MRI or surgery tonight. He will follow up with patient tomorrow morning. No acute pathology shown in xray of hand. CBC WNL. One dose of vancomycin given in ED. Will send home with bactrim. Dr. Wyvonnia [name] evaluated this patient. Return instructions given. Vital signs stable for discharge. Patient / Family / Caregiver informed of clinical course, understand medical decision-making process, and agree with plan.       I personally performed the services described in this documentation, which was scribed in my presence. The recorded information has been reviewed and is accurate.     Elwyn Lade, PA-C 03/17/13 1026

## 2013-03-05 NOTE — ED Provider Notes (Signed)
Redness and swelling of the left hand thenar eminence for the past 3 days. History of tendon repair in February with Dr. Burney Gauze. Saw Dr. Burney Gauze 2 days ago and was told he had tissue inflammation. Denies fever or chills or vomiting.  Erythema and swelling to left thenar eminence. Reduced range of motion of left unable to oppose, or make a fist. Intact distal pulses. Sensation normal.  Discussed with Dr. Burney Gauze. Recommend CBC and plain film x-ray. CBC is 9.9. Dr. Burney Gauze does not think patient needs OR tonight or MRI. He has seen patient.  Recommends IV antibiotics x1 with discharge on Bactrim. He will recheck patient tomorrow afternoon.  [name], MD 03/05/13 2040

## 2013-03-17 NOTE — ED Provider Notes (Signed)
Medical screening examination/treatment/procedure(s) were conducted as a shared visit with non-physician practitioner(s) and myself.  I personally evaluated the patient during the encounter  See my additional note  [name], MD 03/17/13 1445

## 2013-06-07 ENCOUNTER — Encounter: Payer: Self-pay | Admitting: Cardiovascular Disease

## 2013-07-16 ENCOUNTER — Encounter: Payer: Self-pay | Admitting: Cardiovascular Disease

## 2013-07-19 ENCOUNTER — Other Ambulatory Visit: Payer: Self-pay | Admitting: Cardiology

## 2013-09-01 ENCOUNTER — Other Ambulatory Visit: Payer: Self-pay

## 2013-10-21 ENCOUNTER — Encounter: Payer: Self-pay | Admitting: Cardiovascular Disease

## 2013-10-21 ENCOUNTER — Encounter (INDEPENDENT_AMBULATORY_CARE_PROVIDER_SITE_OTHER): Payer: Self-pay

## 2013-10-21 ENCOUNTER — Ambulatory Visit (INDEPENDENT_AMBULATORY_CARE_PROVIDER_SITE_OTHER): Payer: 59 | Admitting: Cardiovascular Disease

## 2013-10-21 VITALS — BP 180/71 | HR 59 | Ht 72.0 in | Wt 211.0 lb

## 2013-10-21 DIAGNOSIS — I2510 Atherosclerotic heart disease of native coronary artery without angina pectoris: Secondary | ICD-10-CM

## 2013-10-21 DIAGNOSIS — I2589 Other forms of chronic ischemic heart disease: Secondary | ICD-10-CM

## 2013-10-21 DIAGNOSIS — I10 Essential (primary) hypertension: Secondary | ICD-10-CM

## 2013-10-21 DIAGNOSIS — F17200 Nicotine dependence, unspecified, uncomplicated: Secondary | ICD-10-CM

## 2013-10-21 DIAGNOSIS — I779 Disorder of arteries and arterioles, unspecified: Secondary | ICD-10-CM

## 2013-10-21 DIAGNOSIS — I255 Ischemic cardiomyopathy: Secondary | ICD-10-CM

## 2013-10-21 DIAGNOSIS — I739 Peripheral vascular disease, unspecified: Secondary | ICD-10-CM

## 2013-10-21 NOTE — Patient Instructions (Signed)

## 2013-10-21 NOTE — Progress Notes (Signed)
History of Present Illness: 62 yo male with history of CAD, DM, HTN, HLD, PAD, tobacco abuse here today for cardiac follow up. He has been followed in the past by Dr. Lia Foyer. Angioplasty LAD in 1990s with directional atherectomy of the LAD in 1994. Cypher DES OM in 2003. Cypher DES LAD in 2004. Last cath December 2010 with patent LAD and OM stents, totally occluded RCA. The LAD had a mid 60% stenosis. The diagonal had a 50% stenosis. 30% stenosis OM. Stent. Dr. Lia Foyer managed him with medical therapy. Carotid dopplers March 2014 with 0-39% RICA stenosis, 123456 LICA stenosis. ABI March 2014 with 0.94 right, 0.8 left. Last echo 2007 with LVEF=50-55% with inferior wall hypokinesis.   He is here today for follow up. He is still smoking, 1/2 ppd. No chest pain or SOB. He is active and works full time. He has no claudication, rest pain or ulcerations.   Primary Care Physician: [name]  Last Lipid Profile: Followed in primary care   Past Medical History  Diagnosis Date  . Diabetes mellitus     type 2  . Peripheral vascular disease   . Hypertension   . Hyperlipemia   . Kidney disease     chronic; stage 2  . Osteoarthritis   . Tobacco abuse   . Coronary artery disease     PCA 1994l; PCI august 2003 of an obtust marginal. Catheterization at the time reveal: left main normal; LAD 50-75% between first and second diagonals, 50% after lesion; left circumflex 95% in obtuse marg. RCA 100% proximal.    Past Surgical History  Procedure Laterality Date  . Appendectomy    . Cholecystectomy    . Coronary angioplasty      stent times 2, last cath 3 years ago  . Nerve exploration  07/29/2012    Procedure: NERVE EXPLORATION;  Surgeon: Schuyler Amor, MD;  Location: [facility];  Service: Orthopedics;  Laterality: Left;  Exploration /repair nerve and tendon left thumb    Current Outpatient Prescriptions  Medication Sig Dispense Refill  . acetaminophen (TYLENOL) 500 MG  tablet Take 1,000 mg by mouth daily as needed for pain.      [name] amLODipine (NORVASC) 10 MG tablet Take 10 mg by mouth daily.        [name] aspirin EC 81 MG tablet Take 81 mg by mouth every evening.      . benazepril (LOTENSIN) 40 MG tablet Take 40 mg by mouth 2 (two) times daily.       . clopidogrel (PLAVIX) 75 MG tablet Take 75 mg by mouth every evening.      . fenofibrate 54 MG tablet Take 54 mg by mouth daily.       . furosemide (LASIX) 80 MG tablet Take 160 mg by mouth daily.       [name] glipiZIDE (GLUCOTROL XL) 5 MG 24 hr tablet Take 5 mg by mouth daily.      [name] losartan (COZAAR) 100 MG tablet Take 100 mg by mouth 2 (two) times daily.       . metFORMIN (GLUCOPHAGE) 1000 MG tablet Take 1,000 mg by mouth 2 (two) times daily with a meal.        . metoprolol (LOPRESSOR) 100 MG tablet Take 100 mg by mouth 2 (two) times daily.        [name] omega-3 acid ethyl esters (LOVAZA) 1 G capsule Take 1 g by mouth 2 (two) times daily.       [name]  rosuvastatin (CRESTOR) 10 MG tablet Take 5 mg by mouth every evening.       . sitaGLIPtin (JANUVIA) 100 MG tablet Take 50 mg by mouth daily.      . vitamin B-12 (CYANOCOBALAMIN) 500 MCG tablet Take 500 mcg by mouth daily.         No current facility-administered medications for this visit.    Allergies  Allergen Reactions  . Penicillins Other (See Comments)    Childhood reaction    History   Social History  . Marital Status: Married    Spouse Name: N/A    Number of Children: N/A  . Years of Education: N/A   Occupational History  . Not on file.   Social History Main Topics  . Smoking status: Current Every Day Smoker -- 0.50 packs/day  . Smokeless tobacco: Not on file  . Alcohol Use: No  . Drug Use: No  . Sexual Activity: Not on file   Other Topics Concern  . Not on file   Social History Narrative   Has a 40-pack-year smoking history and currently smokes 1/2 pack per day.     Family History  Problem Relation Age of Onset  . Coronary artery disease Mother    . Cancer Father   . Stroke Other   . Heart failure Other   . Pulmonary embolism Other     Review of Systems:  As stated in the HPI and otherwise negative.   BP 180/71  Pulse 59  Ht 6' (1.829 m)  Wt 211 lb (95.709 kg)  BMI 28.61 kg/m2  Physical Examination: General: Well developed, well nourished, NAD HEENT: OP clear, mucus membranes moist SKIN: warm, dry. No rashes. Neuro: No focal deficits Musculoskeletal: Muscle strength 5/5 all ext Psychiatric: Mood and affect normal Neck: No JVD, no carotid bruits, no thyromegaly, no lymphadenopathy. Lungs:Clear bilaterally, no wheezes, rhonci, crackles Cardiovascular: Regular rate and rhythm. No murmurs, gallops or rubs. Abdomen:Soft. Bowel sounds present. Non-tender.  Extremities: No lower extremity edema. Pulses are 2 + in the bilateral DP/PT.  EKG: Sinus brady, rate 59 bpm. 2 PVCs. IVCD.   Assessment and Plan:   1. CAD: Stable. Continue ASA, Plavix, statin, beta blocker. No chest pain.   2. PAD: Recent ABI stable. Known bilateral SFA disease. Repeat 2015. Will arrange at next visit.   3. Ischemic cardiomyopathy: Last LVEF=50-55% in 2007. Will repeat echo to assess LVEF.   4. HTN: BP is elevated. He is followed closely by Nephrology (Dr. Jimmy Footman) and Dr. Joylene Draft. No changes today.   5. Carotid artery disease: Moderate carotid artery disease. Will repeat 2015.   6. Tobacco abuse: Smoking cessation encouraged.   I spent 30 minutes today reviewing his old chart, cath reports, echo reports and EKG.

## 2013-11-08 ENCOUNTER — Encounter: Payer: Self-pay | Admitting: Cardiology

## 2013-11-08 ENCOUNTER — Other Ambulatory Visit (HOSPITAL_COMMUNITY): Payer: Self-pay

## 2013-11-08 ENCOUNTER — Ambulatory Visit (HOSPITAL_COMMUNITY): Payer: 59 | Attending: Cardiology | Admitting: Radiology

## 2013-11-08 DIAGNOSIS — I739 Peripheral vascular disease, unspecified: Secondary | ICD-10-CM | POA: Insufficient documentation

## 2013-11-08 DIAGNOSIS — N189 Chronic kidney disease, unspecified: Secondary | ICD-10-CM | POA: Insufficient documentation

## 2013-11-08 DIAGNOSIS — E7800 Pure hypercholesterolemia, unspecified: Secondary | ICD-10-CM | POA: Insufficient documentation

## 2013-11-08 DIAGNOSIS — I255 Ischemic cardiomyopathy: Secondary | ICD-10-CM

## 2013-11-08 DIAGNOSIS — I2510 Atherosclerotic heart disease of native coronary artery without angina pectoris: Secondary | ICD-10-CM | POA: Insufficient documentation

## 2013-11-08 DIAGNOSIS — F17200 Nicotine dependence, unspecified, uncomplicated: Secondary | ICD-10-CM | POA: Insufficient documentation

## 2013-11-08 DIAGNOSIS — I2589 Other forms of chronic ischemic heart disease: Secondary | ICD-10-CM

## 2013-11-08 DIAGNOSIS — I079 Rheumatic tricuspid valve disease, unspecified: Secondary | ICD-10-CM | POA: Insufficient documentation

## 2013-11-08 DIAGNOSIS — E119 Type 2 diabetes mellitus without complications: Secondary | ICD-10-CM | POA: Insufficient documentation

## 2013-11-08 NOTE — Progress Notes (Signed)
Echocardiogram performed.  

## 2014-03-02 ENCOUNTER — Other Ambulatory Visit: Payer: Self-pay | Admitting: Internal Medicine

## 2014-04-05 ENCOUNTER — Encounter: Payer: Self-pay | Admitting: Cardiovascular Disease

## 2014-04-14 ENCOUNTER — Encounter (INDEPENDENT_AMBULATORY_CARE_PROVIDER_SITE_OTHER): Payer: Self-pay

## 2014-04-14 ENCOUNTER — Encounter: Payer: Self-pay | Admitting: Cardiovascular Disease

## 2014-04-14 ENCOUNTER — Ambulatory Visit (INDEPENDENT_AMBULATORY_CARE_PROVIDER_SITE_OTHER): Payer: 59 | Admitting: Cardiovascular Disease

## 2014-04-14 VITALS — BP 148/66 | HR 66 | Ht 71.5 in | Wt 208.0 lb

## 2014-04-14 DIAGNOSIS — I2589 Other forms of chronic ischemic heart disease: Secondary | ICD-10-CM

## 2014-04-14 DIAGNOSIS — F17200 Nicotine dependence, unspecified, uncomplicated: Secondary | ICD-10-CM

## 2014-04-14 DIAGNOSIS — I10 Essential (primary) hypertension: Secondary | ICD-10-CM

## 2014-04-14 DIAGNOSIS — I255 Ischemic cardiomyopathy: Secondary | ICD-10-CM

## 2014-04-14 DIAGNOSIS — I779 Disorder of arteries and arterioles, unspecified: Secondary | ICD-10-CM

## 2014-04-14 DIAGNOSIS — I2510 Atherosclerotic heart disease of native coronary artery without angina pectoris: Secondary | ICD-10-CM

## 2014-04-14 DIAGNOSIS — I739 Peripheral vascular disease, unspecified: Secondary | ICD-10-CM

## 2014-04-14 NOTE — Addendum Note (Signed)
Addended by: Thompson Grayer on: 04/14/2014 09:11 AM   Modules accepted: Orders

## 2014-04-14 NOTE — Progress Notes (Signed)
History of Present Illness: 63 yo male with history of CAD, DM, HTN, HLD, PAD, tobacco abuse here today for cardiac follow up. He has been followed in the past by Dr. Lia Foyer. He had an angioplasty of the LAD in the 1990s with directional atherectomy of the LAD in 1994. Cypher DES OM in 2003. Cypher DES LAD in 2004. Last cath December 2010 with patent LAD and OM stents, totally occluded RCA. The LAD had a mid 60% stenosis. The diagonal had a 50% stenosis. 30% stenosis OM. Stent. Dr. Lia Foyer managed him with medical therapy. Carotid dopplers March 2014 with 0-39% RICA stenosis, 29-79% LICA stenosis. ABI March 2014 with 0.94 right, 0.8 left. Echo 11/08/13 with LVEF=55%, severe LVH, moderate LAE.   He is here today for follow up. He is still smoking, 1/2 ppd. No chest pain or SOB. He is active and works full time. He has no claudication, rest pain or ulcerations. BP well controlled at home.   Primary Care Physician: [name]  Last Lipid Profile: Followed in primary care   Past Medical History  Diagnosis Date  . Diabetes mellitus     type 2  . Peripheral vascular disease   . Hypertension   . Hyperlipemia   . Kidney disease     chronic; stage 2  . Osteoarthritis   . Tobacco abuse   . Coronary artery disease     PCA 1994l; PCI august 2003 of an obtust marginal. Catheterization at the time reveal: left main normal; LAD 50-75% between first and second diagonals, 50% after lesion; left circumflex 95% in obtuse marg. RCA 100% proximal.    Past Surgical History  Procedure Laterality Date  . Appendectomy    . Cholecystectomy    . Coronary angioplasty      stent times 2, last cath 3 years ago  . Nerve exploration  07/29/2012    Procedure: NERVE EXPLORATION;  Surgeon: Schuyler Amor, MD;  Location: [facility];  Service: Orthopedics;  Laterality: Left;  Exploration /repair nerve and tendon left thumb    Current Outpatient Prescriptions  Medication Sig Dispense Refill    . acetaminophen (TYLENOL) 500 MG tablet Take 1,000 mg by mouth daily as needed for pain.      [name] amLODipine (NORVASC) 10 MG tablet Take 10 mg by mouth daily.        [name] aspirin EC 81 MG tablet Take 81 mg by mouth every evening.      . benazepril (LOTENSIN) 40 MG tablet Take 40 mg by mouth 2 (two) times daily.       . clopidogrel (PLAVIX) 75 MG tablet Take 75 mg by mouth every evening.      . fenofibrate 54 MG tablet Take 54 mg by mouth daily.       . furosemide (LASIX) 80 MG tablet Take 160 mg by mouth daily.       [name] glipiZIDE (GLUCOTROL XL) 5 MG 24 hr tablet Take 5 mg by mouth daily.      [name] losartan (COZAAR) 100 MG tablet Take 100 mg by mouth 2 (two) times daily.       . metFORMIN (GLUCOPHAGE) 1000 MG tablet Take 1,000 mg by mouth 2 (two) times daily with a meal.        . metoprolol (LOPRESSOR) 100 MG tablet Take 100 mg by mouth 2 (two) times daily.        . rosuvastatin (CRESTOR) 10 MG tablet Take 5 mg by mouth  every evening.       . sitaGLIPtin (JANUVIA) 100 MG tablet Take 50 mg by mouth daily.      . vitamin B-12 (CYANOCOBALAMIN) 500 MCG tablet Take 500 mcg by mouth daily.         No current facility-administered medications for this visit.    Allergies  Allergen Reactions  . Penicillins Other (See Comments)    Childhood reaction    History   Social History  . Marital Status: Married    Spouse Name: N/A    Number of Children: N/A  . Years of Education: N/A   Occupational History  . Not on file.   Social History Main Topics  . Smoking status: Current Every Day Smoker -- 0.50 packs/day  . Smokeless tobacco: Not on file  . Alcohol Use: No  . Drug Use: No  . Sexual Activity: Not on file   Other Topics Concern  . Not on file   Social History Narrative   Has a 40-pack-year smoking history and currently smokes 1/2 pack per day.     Family History  Problem Relation Age of Onset  . Coronary artery disease Mother   . Cancer Father   . Stroke Other   . Heart failure  Other   . Pulmonary embolism Other     Review of Systems:  As stated in the HPI and otherwise negative.   BP 148/66  Pulse 66  Ht 5' 11.5" (1.816 m)  Wt 208 lb (94.348 kg)  BMI 28.61 kg/m2  Physical Examination: General: Well developed, well nourished, NAD HEENT: OP clear, mucus membranes moist SKIN: warm, dry. No rashes. Neuro: No focal deficits Musculoskeletal: Muscle strength 5/5 all ext Psychiatric: Mood and affect normal Neck: No JVD, no carotid bruits, no thyromegaly, no lymphadenopathy. Lungs:Clear bilaterally, no wheezes, rhonci, crackles Cardiovascular: Regular rate and rhythm. No murmurs, gallops or rubs. Abdomen:Soft. Bowel sounds present. Non-tender.  Extremities: No lower extremity edema. Pulses are 2 + in the bilateral DP/PT.  Echo 11/08/13: Left ventricle: Inferobasal hypokinesis The cavity size was normal. Wall thickness was increased in a pattern of severe LVH. The estimated ejection fraction was 55%. - Aortic valve: There was mild stenosis. - Left atrium: The atrium was moderately dilated. - Atrial septum: No defect or patent foramen ovale was identified.  Assessment and Plan:   1. CAD: Stable. Continue ASA, Plavix, statin, beta blocker. No chest pain.   2. PAD: Last ABI March 2014. Known bilateral SFA disease. Repeat now  3. Ischemic cardiomyopathy: Last LVEF=55% December 2014.  4. HTN: BP is controlled at home. He is followed closely by Nephrology (Dr. Jimmy Footman) and Dr. Joylene Draft. No changes today.   5. Carotid artery disease: Moderate carotid artery disease. Will repeat now  6. Tobacco abuse: Smoking cessation encouraged.

## 2014-04-14 NOTE — Patient Instructions (Signed)
Your physician wants you to follow-up in:  6 months. You will receive a reminder letter in the mail two months in advance. If you don't receive a letter, please call our office to schedule the follow-up appointment.  Your physician has requested that you have an ankle brachial index (ABI). During this test an ultrasound and blood pressure cuff are used to evaluate the arteries that supply the arms and legs with blood. Allow thirty minutes for this exam. There are no restrictions or special instructions.   Your physician has requested that you have a carotid duplex. This test is an ultrasound of the carotid arteries in your neck. It looks at blood flow through these arteries that supply the brain with blood. Allow one hour for this exam. There are no restrictions or special instructions.

## 2014-04-25 ENCOUNTER — Ambulatory Visit (HOSPITAL_COMMUNITY): Payer: 59 | Attending: Cardiology | Admitting: Cardiology

## 2014-04-25 ENCOUNTER — Other Ambulatory Visit (HOSPITAL_COMMUNITY): Payer: Self-pay | Admitting: Cardiology

## 2014-04-25 DIAGNOSIS — I10 Essential (primary) hypertension: Secondary | ICD-10-CM | POA: Insufficient documentation

## 2014-04-25 DIAGNOSIS — E785 Hyperlipidemia, unspecified: Secondary | ICD-10-CM | POA: Insufficient documentation

## 2014-04-25 DIAGNOSIS — F17200 Nicotine dependence, unspecified, uncomplicated: Secondary | ICD-10-CM | POA: Insufficient documentation

## 2014-04-25 DIAGNOSIS — I779 Disorder of arteries and arterioles, unspecified: Secondary | ICD-10-CM

## 2014-04-25 DIAGNOSIS — I739 Peripheral vascular disease, unspecified: Secondary | ICD-10-CM | POA: Insufficient documentation

## 2014-04-25 DIAGNOSIS — E119 Type 2 diabetes mellitus without complications: Secondary | ICD-10-CM | POA: Insufficient documentation

## 2014-04-25 DIAGNOSIS — I2510 Atherosclerotic heart disease of native coronary artery without angina pectoris: Secondary | ICD-10-CM | POA: Insufficient documentation

## 2014-04-25 DIAGNOSIS — I658 Occlusion and stenosis of other precerebral arteries: Secondary | ICD-10-CM | POA: Insufficient documentation

## 2014-04-25 DIAGNOSIS — I6529 Occlusion and stenosis of unspecified carotid artery: Secondary | ICD-10-CM | POA: Insufficient documentation

## 2014-04-25 NOTE — Progress Notes (Signed)
Carotid duplex completed 

## 2014-04-25 NOTE — Progress Notes (Signed)
ABI and lower arterial duplex bilateral completed.

## 2014-04-29 ENCOUNTER — Ambulatory Visit: Payer: Self-pay | Admitting: Cardiovascular Disease

## 2014-05-07 ENCOUNTER — Other Ambulatory Visit: Payer: Self-pay | Admitting: Cardiovascular Disease

## 2014-09-13 ENCOUNTER — Encounter: Payer: Self-pay | Admitting: Vascular Surgery

## 2014-09-13 ENCOUNTER — Other Ambulatory Visit: Payer: Self-pay | Admitting: *Deleted

## 2014-09-13 DIAGNOSIS — I6523 Occlusion and stenosis of bilateral carotid arteries: Secondary | ICD-10-CM

## 2014-10-10 ENCOUNTER — Encounter: Payer: Self-pay | Admitting: Vascular Surgery

## 2014-10-11 ENCOUNTER — Encounter: Payer: Self-pay | Admitting: Vascular Surgery

## 2014-10-11 ENCOUNTER — Ambulatory Visit (INDEPENDENT_AMBULATORY_CARE_PROVIDER_SITE_OTHER): Payer: BC Managed Care – PPO | Admitting: Vascular Surgery

## 2014-10-11 ENCOUNTER — Ambulatory Visit (HOSPITAL_COMMUNITY)
Admission: RE | Admit: 2014-10-11 | Discharge: 2014-10-11 | Disposition: A | Discharge status: Home / Self Care | Payer: BC Managed Care – PPO | Source: Ambulatory Visit | Attending: Vascular Surgery | Admitting: Vascular Surgery

## 2014-10-11 VITALS — BP 160/57 | HR 51 | Ht 71.5 in | Wt 200.0 lb

## 2014-10-11 DIAGNOSIS — I6523 Occlusion and stenosis of bilateral carotid arteries: Secondary | ICD-10-CM

## 2014-10-11 NOTE — Progress Notes (Signed)
Patient name: Travis [name] MRN: [id number] DOB: 02/01/1951 Sex: male   Referred by: Joylene Draft  Reason for referral:  Chief Complaint  Patient presents with  . New Evaluation    carotid stenosis    HISTORY OF PRESENT ILLNESS: Patient is a very pleasant 63 year old gentleman seen today for evaluation of asymptomatic carotid disease. I had seen him on one prior occasion in 2011 where he was having intermittent claudication. That time I recommended walking program only and fortunately has had no progression of his tolerable calf claudication. He did have evaluation at the [facility] in [facility]. He had a CT scan of his neck and chest. The CT scan suggested significant bilateral carotid stenosis and he is seeing [place] today for further discussion of this. I do not have his actual films but do have the report. He specifically denies any prior history of amaurosis fugax or stroke. He reports that his sister did die after suffering multiple small strokes. He does remain stable from a cardiac standpoint with a prior history of coronary angioplasty  Past Medical History  Diagnosis Date  . Diabetes mellitus     type 2  . Peripheral vascular disease   . Hypertension   . Hyperlipemia   . Kidney disease     chronic; stage 2  . Osteoarthritis   . Tobacco abuse   . Coronary artery disease     PCA 1994l; PCI august 2003 of an obtust marginal. Catheterization at the time reveal: left main normal; LAD 50-75% between first and second diagonals, 50% after lesion; left circumflex 95% in obtuse marg. RCA 100% proximal.    Past Surgical History  Procedure Laterality Date  . Appendectomy    . Cholecystectomy    . Coronary angioplasty      stent times 2, last cath 3 years ago  . Nerve exploration  07/29/2012    Procedure: NERVE EXPLORATION;  Surgeon: Schuyler Amor, MD;  Location: [place];  Service: Orthopedics;  Laterality: Left;  Exploration /repair nerve and tendon  left thumb    History   Social History  . Marital Status: Married    Spouse Name: N/A    Number of Children: N/A  . Years of Education: N/A   Occupational History  . Not on file.   Social History Main Topics  . Smoking status: Current Every Day Smoker -- 0.50 packs/day for 50 years    Types: Cigarettes  . Smokeless tobacco: Not on file  . Alcohol Use: No  . Drug Use: No  . Sexual Activity: Not on file   Other Topics Concern  . Not on file   Social History Narrative   Has a 40-pack-year smoking history and currently smokes 1/2 pack per day.     Family History  Problem Relation Age of Onset  . Coronary artery disease Mother   . Cancer Father   . Stroke Other   . Heart failure Other   . Pulmonary embolism Other   . Diabetes Brother   . Hypertension Brother   . Heart attack Brother   . Peripheral vascular disease Brother     Allergies as of 10/11/2014 - Review Complete 10/11/2014  Allergen Reaction Noted  . Penicillins Other (See Comments) 09/08/2009    Current Outpatient Prescriptions on File Prior to Visit  Medication Sig Dispense Refill  . amLODipine (NORVASC) 10 MG tablet Take 10 mg by mouth daily.      [name] aspirin EC  81 MG tablet Take 81 mg by mouth every evening.    . benazepril (LOTENSIN) 40 MG tablet Take 40 mg by mouth 2 (two) times daily.     . clopidogrel (PLAVIX) 75 MG tablet TAKE 1 TABLET (75 MG TOTAL) BY MOUTH DAILY. 30 tablet 5  . fenofibrate 54 MG tablet Take 54 mg by mouth daily.     . furosemide (LASIX) 80 MG tablet Take 160 mg by mouth 2 (two) times daily.     [name] glipiZIDE (GLUCOTROL XL) 5 MG 24 hr tablet Take 5 mg by mouth daily.    [name] losartan (COZAAR) 100 MG tablet Take 100 mg by mouth 2 (two) times daily.     . metFORMIN (GLUCOPHAGE) 1000 MG tablet Take 1,000 mg by mouth 2 (two) times daily with a meal.      . metoprolol (LOPRESSOR) 100 MG tablet Take 100 mg by mouth 2 (two) times daily.      . rosuvastatin (CRESTOR) 10 MG tablet Take 5 mg by  mouth every evening.     . sitaGLIPtin (JANUVIA) 100 MG tablet Take 50 mg by mouth daily.    [name] acetaminophen (TYLENOL) 500 MG tablet Take 1,000 mg by mouth daily as needed for pain.    [name] clopidogrel (PLAVIX) 75 MG tablet Take 75 mg by mouth every evening.    . vitamin B-12 (CYANOCOBALAMIN) 500 MCG tablet Take 500 mcg by mouth daily.       No current facility-administered medications on file prior to visit.     REVIEW OF SYSTEMS:  Positives indicated with an "X"  CARDIOVASCULAR:  [ ]  chest pain   [ ]  chest pressure   [ ]  palpitations   [ ]  orthopnea   [ ]  dyspnea on exertion   [x ] claudication   [ ]  rest pain   [x ] DVT   [ ]  phlebitis PULMONARY:   [ ]  productive cough   [ ]  asthma   [x ] wheezing NEUROLOGIC:   [ ]  weakness  [ ]  paresthesias  [ ]  aphasia  [ ]  amaurosis  [ ]  dizziness HEMATOLOGIC:   [ ]  bleeding problems   [ ]  clotting disorders MUSCULOSKELETAL:  [ ]  joint pain   [ ]  joint swelling GASTROINTESTINAL: [ ]   blood in stool  [ ]   hematemesis GENITOURINARY:  [ ]   dysuria  [ ]   hematuria PSYCHIATRIC:  [ ]  history of major depression INTEGUMENTARY:  [ ]  rashes  [ ]  ulcers CONSTITUTIONAL:  [ ]  fever   [ ]  chills  PHYSICAL EXAMINATION:  General: The patient is a well-nourished male, in no acute distress. Vital signs are BP 160/57 mmHg  Pulse 51  Ht 5' 11.5" (1.816 m)  Wt 200 lb (90.719 kg)  BMI 27.51 kg/m2  SpO2 99% Pulmonary: There is a good air exchange bilaterally without wheezing or rales.  Musculoskeletal: There are no major deformities.  There is no significant extremity pain. Neurologic: No focal weakness or paresthesias are detected, Skin: There are no ulcer or rashes noted. Psychiatric: The patient has normal affect. Cardiovascular: There is a regular rate and rhythm without significant murmur appreciated. Carotid arteries without bruits bilaterally   [facility] Vascular Lab Studies:  Ordered and Independently Reviewed the carotid duplex today showed no evidence  of stenosis bilaterally.  Impression and Plan:  Had long discussion with the patient. I also have duplex from March 2014 from an outlying vascular lab also showing no evidence of stenosis in either  carotid system. Explained that it was quite unusual to have such a discrepancy between CT of his neck and duplex. I certainly felt comfortable with continued observation since he has had 2 duplexes showing no stenosis. Will obtain the actual films from the [facility] to evaluate this as well. He did have a 3 mm pulmonary nodule with the suggestion at the [place] to have thoracic CT in 6 months. We have scheduled him for formal CT angiogram and chest CT in 6 months for further follow-up. If his CT from the [facility] is more concerning we will contact him for sooner evaluation. I did review symptoms of carotid disease and has notify us should this occur    Lynita Groseclose [facility] Office: [phone]

## 2014-10-27 ENCOUNTER — Ambulatory Visit (INDEPENDENT_AMBULATORY_CARE_PROVIDER_SITE_OTHER): Payer: BC Managed Care – PPO | Admitting: Cardiovascular Disease

## 2014-10-27 ENCOUNTER — Encounter: Payer: Self-pay | Admitting: Cardiovascular Disease

## 2014-10-27 VITALS — BP 150/60 | HR 56 | Ht 71.5 in | Wt 201.8 lb

## 2014-10-27 DIAGNOSIS — E7800 Pure hypercholesterolemia, unspecified: Secondary | ICD-10-CM

## 2014-10-27 DIAGNOSIS — I739 Peripheral vascular disease, unspecified: Secondary | ICD-10-CM

## 2014-10-27 DIAGNOSIS — F17200 Nicotine dependence, unspecified, uncomplicated: Secondary | ICD-10-CM

## 2014-10-27 DIAGNOSIS — I10 Essential (primary) hypertension: Secondary | ICD-10-CM

## 2014-10-27 DIAGNOSIS — I255 Ischemic cardiomyopathy: Secondary | ICD-10-CM

## 2014-10-27 DIAGNOSIS — I2510 Atherosclerotic heart disease of native coronary artery without angina pectoris: Secondary | ICD-10-CM

## 2014-10-27 DIAGNOSIS — Z720 Tobacco use: Secondary | ICD-10-CM

## 2014-10-27 NOTE — Patient Instructions (Signed)
Your physician wants you to follow-up in: 6 months.   You will receive a reminder letter in the mail two months in advance. If you don't receive a letter, please call our office to schedule the follow-up appointment.  Your physician has requested that you have a lexiscan myoview. For further information please visit [email or web address]. Please follow instruction sheet, as given.   

## 2014-10-27 NOTE — Progress Notes (Signed)
History of Present Illness: 63 yo Travis [name] with history of CAD, DM, HTN, HLD, PAD, tobacco abuse here today for cardiac follow up. He has been followed in the past by Dr. Lia Foyer. He had an angioplasty of the LAD in the 1990s with directional atherectomy of the LAD in 1994. Cypher DES OM in 2003. Cypher DES LAD in 2004. Last cath December 2010 with patent LAD and OM stents, totally occluded RCA. The LAD had a mid 60% stenosis. The diagonal had a 50% stenosis. 30% stenosis OM. Stent. Dr. Lia Foyer managed him with medical therapy. Carotid dopplers December 2015 with less than 40% bilateral ICA stenosis. ABI March 2014 with 0.78 right, 0.69 left. Echo 11/08/13 with LVEF=55%, severe LVH, moderate LAE.   He is here today for follow up. He is still smoking, 1/2 ppd. No chest pain or SOB. He is active and works full time. He has no claudication, rest pain or ulcerations. BP elevated at home but his meds are being adjusted by Dr. Jimmy Footman.   Primary Care Physician: [name]  Last Lipid Profile: Followed in primary care   Past Medical History  Diagnosis Date  . Diabetes mellitus     type 2  . Peripheral vascular disease   . Hypertension   . Hyperlipemia   . Kidney disease     chronic; stage 2  . Osteoarthritis   . Tobacco abuse   . Coronary artery disease     PCA 1994l; PCI august 2003 of an obtust marginal. Catheterization at the time reveal: left main normal; LAD 50-75% between first and second diagonals, 50% after lesion; left circumflex 95% in obtuse marg. RCA 100% proximal.    Past Surgical History  Procedure Laterality Date  . Appendectomy    . Cholecystectomy    . Coronary angioplasty      stent times 2, last cath 3 years ago  . Nerve exploration  07/29/2012    Procedure: NERVE EXPLORATION;  Surgeon: Schuyler Amor, MD;  Location: [place];  Service: Orthopedics;  Laterality: Left;  Exploration /repair nerve and tendon left thumb    Current Outpatient  Prescriptions  Medication Sig Dispense Refill  . acetaminophen (TYLENOL) 500 MG tablet Take 1,000 mg by mouth daily as needed for pain.    [name] amLODipine (NORVASC) 10 MG tablet Take 10 mg by mouth daily.      . benazepril (LOTENSIN) 40 MG tablet Take 40 mg by mouth 2 (two) times daily.     . clopidogrel (PLAVIX) 75 MG tablet Take 75 mg by mouth every evening.    . fenofibrate 54 MG tablet Take 54 mg by mouth daily.     . furosemide (LASIX) 80 MG tablet Take 160 mg by mouth 2 (two) times daily.     [name] glipiZIDE (GLUCOTROL XL) 5 MG 24 hr tablet Take 5 mg by mouth daily.    [name] losartan (COZAAR) 100 MG tablet Take 100 mg by mouth 2 (two) times daily.     . metFORMIN (GLUCOPHAGE) 1000 MG tablet Take 1,000 mg by mouth 2 (two) times daily with a meal.      . metoprolol (LOPRESSOR) 100 MG tablet Take 100 mg by mouth 2 (two) times daily.      . rosuvastatin (CRESTOR) 10 MG tablet Take 5 mg by mouth every evening.     . sitaGLIPtin (JANUVIA) 100 MG tablet Take 100 mg by mouth daily.     [name] spironolactone (ALDACTONE) 25 MG tablet  Take 25 mg by mouth 2 (two) times daily.  6  . aspirin EC 81 MG tablet Take 81 mg by mouth every evening.     No current facility-administered medications for this visit.    Allergies  Allergen Reactions  . Penicillins Other (See Comments)    Childhood reaction    History   Social History  . Marital Status: Married    Spouse Name: N/A    Number of Children: N/A  . Years of Education: N/A   Occupational History  . Not on file.   Social History Main Topics  . Smoking status: Current Every Day Smoker -- 0.50 packs/day for 50 years    Types: Cigarettes  . Smokeless tobacco: Not on file  . Alcohol Use: No  . Drug Use: No  . Sexual Activity: Not on file   Other Topics Concern  . Not on file   Social History Narrative   Has a 40-pack-year smoking history and currently smokes 1/2 pack per day.     Family History  Problem Relation Age of Onset  . Coronary artery  disease Mother   . Cancer Father   . Stroke Other   . Heart failure Other   . Pulmonary embolism Other   . Diabetes Brother   . Hypertension Brother   . Heart attack Brother   . Peripheral vascular disease Brother     Review of Systems:  As stated in the HPI and otherwise negative.   BP 150/60 mmHg  Pulse 56  Ht 5' 11.5" (1.816 m)  Wt 201 lb 12.8 oz (91.536 kg)  BMI 27.Travis kg/m2  SpO2 95%  Physical Examination: General: Well developed, well nourished, NAD HEENT: OP clear, mucus membranes moist SKIN: warm, dry. No rashes. Neuro: No focal deficits Musculoskeletal: Muscle strength 5/5 all ext Psychiatric: Mood and affect normal Neck: No JVD, no carotid bruits, no thyromegaly, no lymphadenopathy. Lungs:Clear bilaterally, no wheezes, rhonci, crackles Cardiovascular: Regular rate and rhythm. No murmurs, gallops or rubs. Abdomen:Soft. Bowel sounds present. Non-tender.  Extremities: No lower extremity edema. Pulses are 1+ in the bilateral DP/PT.  Echo 11/08/13: Left ventricle: Inferobasal hypokinesis The cavity size was normal. Wall thickness was increased in a pattern of severe LVH. The estimated ejection fraction was 55%. - Aortic valve: There was mild stenosis. - Left atrium: The atrium was moderately dilated. - Atrial septum: No defect or patent foramen ovale was Identified.  EKG: Sinus brady, rate 56 bpm. IVCD. T wave inversions diffusely.   Assessment and Plan:   1. CAD: Stable. Continue ASA, Plavix, statin, beta blocker. No chest pain. Will arrange Lexiscan stress myoview since he has had no recent ischemic testing.   2. PAD: Last ABI June 2015 and stable. Known bilateral SFA disease. Repeat June 2016.   3. Ischemic cardiomyopathy: Last LVEF=55% December 2014. Continue current meds.   4. HTN: BP has been elevated but is being followed closely by Nephrology (Dr. Jimmy Footman) and Dr. Joylene Draft. No changes today.   5. Carotid artery disease: Mild carotid artery disease,  followed by Dr. Donnetta Hutching in [facility].   6. Tobacco abuse: Smoking cessation encouraged. He does not wish to stop smoking

## 2014-11-14 ENCOUNTER — Other Ambulatory Visit: Payer: Self-pay | Admitting: Cardiovascular Disease

## 2015-01-13 ENCOUNTER — Encounter (HOSPITAL_COMMUNITY): Payer: Self-pay

## 2015-03-03 ENCOUNTER — Other Ambulatory Visit: Payer: Self-pay | Admitting: Internal Medicine

## 2015-03-03 ENCOUNTER — Ambulatory Visit
Admission: RE | Admit: 2015-03-03 | Discharge: 2015-03-03 | Disposition: A | Discharge status: Home / Self Care | Payer: Medicare Other | Source: Ambulatory Visit | Attending: Internal Medicine | Admitting: Internal Medicine

## 2015-03-03 DIAGNOSIS — I739 Peripheral vascular disease, unspecified: Secondary | ICD-10-CM | POA: Diagnosis not present

## 2015-03-03 DIAGNOSIS — M545 Low back pain: Secondary | ICD-10-CM | POA: Diagnosis not present

## 2015-03-03 DIAGNOSIS — Z1389 Encounter for screening for other disorder: Secondary | ICD-10-CM | POA: Diagnosis not present

## 2015-03-03 DIAGNOSIS — Z6827 Body mass index (BMI) 27.0-27.9, adult: Secondary | ICD-10-CM | POA: Diagnosis not present

## 2015-03-03 DIAGNOSIS — D592 Drug-induced nonautoimmune hemolytic anemia: Secondary | ICD-10-CM | POA: Diagnosis not present

## 2015-03-03 DIAGNOSIS — R531 Weakness: Secondary | ICD-10-CM | POA: Diagnosis not present

## 2015-03-03 DIAGNOSIS — M47817 Spondylosis without myelopathy or radiculopathy, lumbosacral region: Secondary | ICD-10-CM | POA: Diagnosis not present

## 2015-03-03 DIAGNOSIS — R252 Cramp and spasm: Secondary | ICD-10-CM | POA: Diagnosis not present

## 2015-03-03 DIAGNOSIS — N644 Mastodynia: Secondary | ICD-10-CM | POA: Diagnosis not present

## 2015-03-08 ENCOUNTER — Other Ambulatory Visit: Payer: Self-pay | Admitting: *Deleted

## 2015-03-08 DIAGNOSIS — R911 Solitary pulmonary nodule: Secondary | ICD-10-CM

## 2015-03-08 DIAGNOSIS — I739 Peripheral vascular disease, unspecified: Secondary | ICD-10-CM

## 2015-03-08 DIAGNOSIS — I6523 Occlusion and stenosis of bilateral carotid arteries: Secondary | ICD-10-CM

## 2015-03-28 ENCOUNTER — Ambulatory Visit: Payer: Medicare Other | Admitting: Vascular Surgery

## 2015-03-28 ENCOUNTER — Encounter (HOSPITAL_COMMUNITY): Payer: Medicare Other

## 2015-03-29 ENCOUNTER — Other Ambulatory Visit: Payer: Self-pay | Admitting: Vascular Surgery

## 2015-03-29 DIAGNOSIS — I6523 Occlusion and stenosis of bilateral carotid arteries: Secondary | ICD-10-CM | POA: Diagnosis not present

## 2015-03-29 LAB — CREATININE, SERUM: Creat: 2.2 mg/dL — ABNORMAL HIGH (ref 0.50–1.35)

## 2015-03-29 LAB — BUN: BUN: 53 mg/dL — ABNORMAL HIGH (ref 6–23)

## 2015-03-31 ENCOUNTER — Encounter: Payer: Self-pay | Admitting: Vascular Surgery

## 2015-04-03 ENCOUNTER — Telehealth: Payer: Self-pay | Admitting: Vascular Surgery

## 2015-04-03 DIAGNOSIS — I129 Hypertensive chronic kidney disease with stage 1 through stage 4 chronic kidney disease, or unspecified chronic kidney disease: Secondary | ICD-10-CM | POA: Diagnosis not present

## 2015-04-03 DIAGNOSIS — N183 Chronic kidney disease, stage 3 (moderate): Secondary | ICD-10-CM | POA: Diagnosis not present

## 2015-04-03 DIAGNOSIS — E1129 Type 2 diabetes mellitus with other diabetic kidney complication: Secondary | ICD-10-CM | POA: Diagnosis not present

## 2015-04-03 DIAGNOSIS — D631 Anemia in chronic kidney disease: Secondary | ICD-10-CM | POA: Diagnosis not present

## 2015-04-03 NOTE — Telephone Encounter (Signed)
Our RN, Zigmund Daniel, asked that I cancel Travis [name]'s CTA Neck at this time due to his elevated BUN/Creat. I spoke with Travis [name] and it was decided that he would cancel his CTA Neck and CT Chest until he can speak with Dr Donnetta Hutching. He is scheduled for ABI and office visit with Dr Donnetta Hutching tomorrow and will ask him what he should do about his CT appointments. Zigmund Daniel is aware and thinks it is a good idea to keep his appointments with [facility] and cancel the CTA/CT. [facility] is recommending ultrasound, however in the past his ultrasound did not show what we would need from an imaging report. Will forward this to [facility] for his review. dpm

## 2015-04-04 ENCOUNTER — Other Ambulatory Visit: Payer: Self-pay | Admitting: *Deleted

## 2015-04-04 ENCOUNTER — Inpatient Hospital Stay: Admission: RE | Admit: 2015-04-04 | Payer: Medicare Other | Source: Ambulatory Visit

## 2015-04-04 ENCOUNTER — Other Ambulatory Visit: Payer: Self-pay

## 2015-04-04 ENCOUNTER — Encounter: Payer: Self-pay | Admitting: Vascular Surgery

## 2015-04-04 ENCOUNTER — Ambulatory Visit (INDEPENDENT_AMBULATORY_CARE_PROVIDER_SITE_OTHER)
Admission: RE | Admit: 2015-04-04 | Discharge: 2015-04-04 | Disposition: A | Discharge status: Home / Self Care | Payer: Medicare Other | Source: Ambulatory Visit | Attending: Vascular Surgery | Admitting: Vascular Surgery

## 2015-04-04 ENCOUNTER — Ambulatory Visit (HOSPITAL_COMMUNITY)
Admission: RE | Admit: 2015-04-04 | Discharge: 2015-04-04 | Disposition: A | Discharge status: Home / Self Care | Payer: Medicare Other | Source: Ambulatory Visit | Attending: Vascular Surgery | Admitting: Vascular Surgery

## 2015-04-04 ENCOUNTER — Ambulatory Visit (INDEPENDENT_AMBULATORY_CARE_PROVIDER_SITE_OTHER): Payer: Medicare Other | Admitting: Vascular Surgery

## 2015-04-04 VITALS — BP 129/67 | HR 60 | Resp 18 | Ht 71.5 in | Wt 196.9 lb

## 2015-04-04 DIAGNOSIS — I10 Essential (primary) hypertension: Secondary | ICD-10-CM | POA: Diagnosis not present

## 2015-04-04 DIAGNOSIS — F17200 Nicotine dependence, unspecified, uncomplicated: Secondary | ICD-10-CM | POA: Insufficient documentation

## 2015-04-04 DIAGNOSIS — I6523 Occlusion and stenosis of bilateral carotid arteries: Secondary | ICD-10-CM | POA: Diagnosis not present

## 2015-04-04 DIAGNOSIS — E785 Hyperlipidemia, unspecified: Secondary | ICD-10-CM | POA: Insufficient documentation

## 2015-04-04 DIAGNOSIS — I739 Peripheral vascular disease, unspecified: Secondary | ICD-10-CM | POA: Diagnosis not present

## 2015-04-04 DIAGNOSIS — E119 Type 2 diabetes mellitus without complications: Secondary | ICD-10-CM | POA: Diagnosis not present

## 2015-04-04 NOTE — Progress Notes (Signed)
Here today for continued discussion of potential carotid disease and also discussion of lower extremity claudication symptoms. I'd seen him in December. He had had several different carotid duplex suggesting no significant disease. He had a CT scan in [place] suggesting significant carotid disease. He had been scheduled for repeat CT angiogram. He does have renal insufficiency with a creatinine of 2.0 level and therefore we recommended against CT angiogram on finding this. He repeat carotid duplex today we discussed this with patient. His main complaint today is of lower extremity claudication symptoms. He does report classic claudication. On walking especially with monitoring his progress he begins to have pain in his Feet Continues to Walk Will Have for Pain Extending up into Both Hips. He Reports This Is Equal Right and Left. He Denies Any Tissue Loss.  Past Medical History  Diagnosis Date  . Diabetes mellitus     type 2  . Peripheral vascular disease   . Hypertension   . Hyperlipemia   . Kidney disease     chronic; stage 2  . Osteoarthritis   . Tobacco abuse   . Coronary artery disease     PCA 1994l; PCI august 2003 of an obtust marginal. Catheterization at the time reveal: left main normal; LAD 50-75% between first and second diagonals, 50% after lesion; left circumflex 95% in obtuse marg. RCA 100% proximal.  . CAD (coronary artery disease)   . Myocardial infarction     History  Substance Use Topics  . Smoking status: Current Every Day Smoker -- 0.50 packs/day for 50 years    Types: Cigarettes  . Smokeless tobacco: Not on file  . Alcohol Use: No    Family History  Problem Relation Age of Onset  . Coronary artery disease Mother   . Heart disease Mother   . Cancer Father   . Stroke Other   . Heart failure Other   . Pulmonary embolism Other   . Diabetes Brother   . Hypertension Brother   . Heart attack Brother   . Peripheral vascular disease Brother   . Heart disease  Brother   . Hypertension Sister     Allergies  Allergen Reactions  . Penicillins Other (See Comments)    Childhood reaction     Current outpatient prescriptions:  .  acetaminophen (TYLENOL) 500 MG tablet, Take 1,000 mg by mouth daily as needed for pain., Disp: , Rfl:  .  amLODipine (NORVASC) 10 MG tablet, Take 10 mg by mouth daily.  , Disp: , Rfl:  .  aspirin EC 81 MG tablet, Take 81 mg by mouth every evening., Disp: , Rfl:  .  benazepril (LOTENSIN) 40 MG tablet, Take 40 mg by mouth 2 (two) times daily. , Disp: , Rfl:  .  clopidogrel (PLAVIX) 75 MG tablet, TAKE ONE TABLET BY MOUTH ONCE DAILY, Disp: 30 tablet, Rfl: 5 .  fenofibrate 54 MG tablet, Take 54 mg by mouth daily. , Disp: , Rfl:  .  furosemide (LASIX) 80 MG tablet, Take 160 mg by mouth 2 (two) times daily. , Disp: , Rfl:  .  glipiZIDE (GLUCOTROL XL) 5 MG 24 hr tablet, Take 5 mg by mouth daily., Disp: , Rfl:  .  losartan (COZAAR) 100 MG tablet, Take 100 mg by mouth 2 (two) times daily. , Disp: , Rfl:  .  metFORMIN (GLUCOPHAGE) 1000 MG tablet, Take 1,000 mg by mouth 2 (two) times daily with a meal.  , Disp: , Rfl:  .  metoprolol (LOPRESSOR) 100  MG tablet, Take 100 mg by mouth 2 (two) times daily.  , Disp: , Rfl:  .  rosuvastatin (CRESTOR) 10 MG tablet, Take 10 mg by mouth every evening. , Disp: , Rfl:  .  sitaGLIPtin (JANUVIA) 100 MG tablet, Take 100 mg by mouth daily. , Disp: , Rfl:  .  spironolactone (ALDACTONE) 25 MG tablet, Take 25 mg by mouth 2 (two) times daily., Disp: , Rfl: 6 .  TURMERIC PO, Take 400 mg by mouth 2 (two) times daily., Disp: , Rfl:   Filed Vitals:   04/04/15 1426 04/04/15 1428  BP:  129/67  Pulse:  60  Resp: 18 18  Height: 5' 11.5" (1.816 m)   Weight: 196 lb 14.4 oz (89.313 kg)     Body mass index is 27.08 kg/(m^2).       On physical exam he is alert and oriented no acute distress. He is grossly intact neurologically Pulse status 2+ radial pulses. He does have a palpable femoral pulses  bilaterally. I do not palpate pedal pulses bilaterally  Carotid duplex and lower from the arterial studies were done and discussed with the patient and his wife present. His carotid duplex revealed no evidence of stenosis bilaterally. His lower extremity duplex evaluation shows ankle arm index of 0.9 on the right and 0.63 on the left. He has monophasic posterior tibial and waveforms bilaterally. The right he has biphasic dorsalis pedis and left monophasic. He has dampened digital waveforms bilaterally.  Impression and plan no evidence of carotid disease on repeat duplex. Would not recommend any further evaluation unless he had neurologic deficits.  He does have a classic claudication type symptoms. He reports this quite limiting to him. He is recently retired and was to be more active. Have recommended arteriography for further evaluation. Explained that is a good chance that he would have lesions treatable with the angioplasty and stenting. Will use CO2 as contrast with a potential for slight amount of iodinated contrast. Explained that this should pose a very little if any risk to his kidneys. He understands we will proceed after a upcoming bowling tournament in mid June.

## 2015-04-18 ENCOUNTER — Ambulatory Visit: Payer: Self-pay | Admitting: Vascular Surgery

## 2015-04-26 ENCOUNTER — Ambulatory Visit (HOSPITAL_COMMUNITY)
Admission: RE | Admit: 2015-04-26 | Discharge: 2015-04-26 | Disposition: A | Discharge status: Home / Self Care | Payer: Medicare Other | Source: Ambulatory Visit | Attending: Vascular Surgery | Admitting: Vascular Surgery

## 2015-04-26 ENCOUNTER — Encounter (HOSPITAL_COMMUNITY)
Admission: RE | Disposition: A | Discharge status: Home / Self Care | Payer: Self-pay | Source: Ambulatory Visit | Attending: Vascular Surgery

## 2015-04-26 ENCOUNTER — Other Ambulatory Visit: Payer: Self-pay | Admitting: *Deleted

## 2015-04-26 DIAGNOSIS — I70212 Atherosclerosis of native arteries of extremities with intermittent claudication, left leg: Secondary | ICD-10-CM | POA: Diagnosis not present

## 2015-04-26 DIAGNOSIS — F17210 Nicotine dependence, cigarettes, uncomplicated: Secondary | ICD-10-CM | POA: Insufficient documentation

## 2015-04-26 DIAGNOSIS — E785 Hyperlipidemia, unspecified: Secondary | ICD-10-CM | POA: Diagnosis not present

## 2015-04-26 DIAGNOSIS — I2510 Atherosclerotic heart disease of native coronary artery without angina pectoris: Secondary | ICD-10-CM | POA: Insufficient documentation

## 2015-04-26 DIAGNOSIS — M1990 Unspecified osteoarthritis, unspecified site: Secondary | ICD-10-CM | POA: Diagnosis not present

## 2015-04-26 DIAGNOSIS — E1122 Type 2 diabetes mellitus with diabetic chronic kidney disease: Secondary | ICD-10-CM | POA: Insufficient documentation

## 2015-04-26 DIAGNOSIS — N182 Chronic kidney disease, stage 2 (mild): Secondary | ICD-10-CM | POA: Insufficient documentation

## 2015-04-26 DIAGNOSIS — Z7982 Long term (current) use of aspirin: Secondary | ICD-10-CM | POA: Diagnosis not present

## 2015-04-26 DIAGNOSIS — Z7902 Long term (current) use of antithrombotics/antiplatelets: Secondary | ICD-10-CM | POA: Diagnosis not present

## 2015-04-26 DIAGNOSIS — Z880 Allergy status to penicillin: Secondary | ICD-10-CM | POA: Insufficient documentation

## 2015-04-26 DIAGNOSIS — I129 Hypertensive chronic kidney disease with stage 1 through stage 4 chronic kidney disease, or unspecified chronic kidney disease: Secondary | ICD-10-CM | POA: Insufficient documentation

## 2015-04-26 DIAGNOSIS — I252 Old myocardial infarction: Secondary | ICD-10-CM | POA: Diagnosis not present

## 2015-04-26 DIAGNOSIS — I739 Peripheral vascular disease, unspecified: Secondary | ICD-10-CM

## 2015-04-26 HISTORY — PX: PERIPHERAL VASCULAR CATHETERIZATION: SHX172C

## 2015-04-26 LAB — POCT I-STAT, CHEM 8
BUN: 62 mg/dL — ABNORMAL HIGH (ref 6–20)
CALCIUM ION: 1.25 mmol/L (ref 1.13–1.30)
Chloride: 107 mmol/L (ref 101–111)
Creatinine, Ser: 2.3 mg/dL — ABNORMAL HIGH (ref 0.61–1.24)
Glucose, Bld: 103 mg/dL — ABNORMAL HIGH (ref 65–99)
HEMATOCRIT: 45 % (ref 39.0–52.0)
HEMOGLOBIN: 15.3 g/dL (ref 13.0–17.0)
Potassium: 4.7 mmol/L (ref 3.5–5.1)
Sodium: 139 mmol/L (ref 135–145)
TCO2: 21 mmol/L (ref 0–100)

## 2015-04-26 LAB — GLUCOSE, CAPILLARY
Glucose-Capillary: 99 mg/dL (ref 65–99)
Glucose-Capillary: 99 mg/dL (ref 65–99)

## 2015-04-26 SURGERY — ABDOMINAL AORTOGRAM
Anesthesia: LOCAL

## 2015-04-26 MED ORDER — SODIUM CHLORIDE 0.9 % IV SOLN: 1.0000 mL/kg/h | INTRAVENOUS | Status: DC

## 2015-04-26 MED ORDER — SODIUM CHLORIDE 0.9 % IV SOLN
INTRAVENOUS | Status: DC
  Administered 2015-04-26: 10:00:00 via INTRAVENOUS

## 2015-04-26 SURGICAL SUPPLY — 12 items
CATH OMNI FLUSH 5F 65CM (CATHETERS) ×2 IMPLANT
COVER PRB 48X5XTLSCP FOLD TPE (BAG) ×1 IMPLANT
COVER PROBE 5X48 (BAG) ×1
FILTER CO2 0.2 MICRON (VASCULAR PRODUCTS) ×2 IMPLANT
KIT PV (KITS) ×2 IMPLANT
RESERVOIR CO2 (VASCULAR PRODUCTS) ×2 IMPLANT
SET FLUSH CO2 (MISCELLANEOUS) ×2 IMPLANT
SHEATH PINNACLE 5F 10CM (SHEATH) ×2 IMPLANT
SYR MEDRAD MARK V 150ML (SYRINGE) IMPLANT
TRANSDUCER W/STOPCOCK (MISCELLANEOUS) ×2 IMPLANT
TRAY PV CATH (CUSTOM PROCEDURE TRAY) ×2 IMPLANT
WIRE HI TORQ VERSACORE-J 145CM (WIRE) ×2 IMPLANT

## 2015-04-26 NOTE — Discharge Instructions (Signed)

## 2015-04-26 NOTE — Progress Notes (Signed)
Site area: lt groin Site Prior to Removal:  Level  0 Pressure Applied For:  20 minutes Manual:   yes Patient Status During Pull:  stable Post Pull Site:  Level  0 Post Pull Instructions Given:  yes Post Pull Pulses Present: yes Dressing Applied:  tegaderm Bedrest begins @  1500 Comments:

## 2015-04-26 NOTE — H&P (View-Only) (Signed)
Here today for continued discussion of potential carotid disease and also discussion of lower extremity claudication symptoms. I'd seen him in December. He had had several different carotid duplex suggesting no significant disease. He had a CT scan in [place] suggesting significant carotid disease. He had been scheduled for repeat CT angiogram. He does have renal insufficiency with a creatinine of 2.0 level and therefore we recommended against CT angiogram on finding this. He repeat carotid duplex today we discussed this with patient. His main complaint today is of lower extremity claudication symptoms. He does report classic claudication. On walking especially with monitoring his progress he begins to have pain in his Feet Continues to Walk Will Have for Pain Extending up into Both Hips. He Reports This Is Equal Right and Left. He Denies Any Tissue Loss.  Past Medical History  Diagnosis Date  . Diabetes mellitus     type 2  . Peripheral vascular disease   . Hypertension   . Hyperlipemia   . Kidney disease     chronic; stage 2  . Osteoarthritis   . Tobacco abuse   . Coronary artery disease     PCA 1994l; PCI august 2003 of an obtust marginal. Catheterization at the time reveal: left main normal; LAD 50-75% between first and second diagonals, 50% after lesion; left circumflex 95% in obtuse marg. RCA 100% proximal.  . CAD (coronary artery disease)   . Myocardial infarction     History  Substance Use Topics  . Smoking status: Current Every Day Smoker -- 0.50 packs/day for 50 years    Types: Cigarettes  . Smokeless tobacco: Not on file  . Alcohol Use: No    Family History  Problem Relation Age of Onset  . Coronary artery disease Mother   . Heart disease Mother   . Cancer Father   . Stroke Other   . Heart failure Other   . Pulmonary embolism Other   . Diabetes Brother   . Hypertension Brother   . Heart attack Brother   . Peripheral vascular disease Brother   . Heart disease  Brother   . Hypertension Sister     Allergies  Allergen Reactions  . Penicillins Other (See Comments)    Childhood reaction     Current outpatient prescriptions:  .  acetaminophen (TYLENOL) 500 MG tablet, Take 1,000 mg by mouth daily as needed for pain., Disp: , Rfl:  .  amLODipine (NORVASC) 10 MG tablet, Take 10 mg by mouth daily.  , Disp: , Rfl:  .  aspirin EC 81 MG tablet, Take 81 mg by mouth every evening., Disp: , Rfl:  .  benazepril (LOTENSIN) 40 MG tablet, Take 40 mg by mouth 2 (two) times daily. , Disp: , Rfl:  .  clopidogrel (PLAVIX) 75 MG tablet, TAKE ONE TABLET BY MOUTH ONCE DAILY, Disp: 30 tablet, Rfl: 5 .  fenofibrate 54 MG tablet, Take 54 mg by mouth daily. , Disp: , Rfl:  .  furosemide (LASIX) 80 MG tablet, Take 160 mg by mouth 2 (two) times daily. , Disp: , Rfl:  .  glipiZIDE (GLUCOTROL XL) 5 MG 24 hr tablet, Take 5 mg by mouth daily., Disp: , Rfl:  .  losartan (COZAAR) 100 MG tablet, Take 100 mg by mouth 2 (two) times daily. , Disp: , Rfl:  .  metFORMIN (GLUCOPHAGE) 1000 MG tablet, Take 1,000 mg by mouth 2 (two) times daily with a meal.  , Disp: , Rfl:  .  metoprolol (LOPRESSOR) 100  MG tablet, Take 100 mg by mouth 2 (two) times daily.  , Disp: , Rfl:  .  rosuvastatin (CRESTOR) 10 MG tablet, Take 10 mg by mouth every evening. , Disp: , Rfl:  .  sitaGLIPtin (JANUVIA) 100 MG tablet, Take 100 mg by mouth daily. , Disp: , Rfl:  .  spironolactone (ALDACTONE) 25 MG tablet, Take 25 mg by mouth 2 (two) times daily., Disp: , Rfl: 6 .  TURMERIC PO, Take 400 mg by mouth 2 (two) times daily., Disp: , Rfl:   Filed Vitals:   04/04/15 1426 04/04/15 1428  BP:  129/67  Pulse:  60  Resp: 18 18  Height: 5' 11.5" (1.816 m)   Weight: 196 lb 14.4 oz (89.313 kg)     Body mass index is 27.08 kg/(m^2).       On physical exam he is alert and oriented no acute distress. He is grossly intact neurologically Pulse status 2+ radial pulses. He does have a palpable femoral pulses  bilaterally. I do not palpate pedal pulses bilaterally  Carotid duplex and lower from the arterial studies were done and discussed with the patient and his wife present. His carotid duplex revealed no evidence of stenosis bilaterally. His lower extremity duplex evaluation shows ankle arm index of 0.9 on the right and 0.63 on the left. He has monophasic posterior tibial and waveforms bilaterally. The right he has biphasic dorsalis pedis and left monophasic. He has dampened digital waveforms bilaterally.  Impression and plan no evidence of carotid disease on repeat duplex. Would not recommend any further evaluation unless he had neurologic deficits.  He does have a classic claudication type symptoms. He reports this quite limiting to him. He is recently retired and was to be more active. Have recommended arteriography for further evaluation. Explained that is a good chance that he would have lesions treatable with the angioplasty and stenting. Will use CO2 as contrast with a potential for slight amount of iodinated contrast. Explained that this should pose a very little if any risk to his kidneys. He understands we will proceed after a upcoming bowling tournament in mid June.

## 2015-04-26 NOTE — Progress Notes (Signed)
Patient ID: Travis [name], male   DOB: 03-Dec-1950, 64 y.o.   MRN: [id number]  stable a short stay following left groin access for arteriogram. Chest again with the patient and his wife present. Has left superficial femoral artery occlusion and left calf claudication. Continued observation. Not comfortable with this level claudication but agrees for observation at this time. We'll see him again in 6 months for continued discussion and ankle arm indices. He will notify should he develop any progressive claudication or tissue loss

## 2015-04-26 NOTE — Interval H&P Note (Signed)
History and Physical Interval Note:  04/26/2015 1:04 PM  Travis [name]  has presented today for surgery, with the diagnosis of pvd  The various methods of treatment have been discussed with the patient and family. After consideration of risks, benefits and other options for treatment, the patient has consented to  Procedure(s): Abdominal Aortogram (N/A) as a surgical intervention .  The patient's history has been reviewed, patient examined, no change in status, stable for surgery.  I have reviewed the patient's chart and labs.  Questions were answered to the patient's satisfaction.     [name]

## 2015-04-27 ENCOUNTER — Encounter (HOSPITAL_COMMUNITY): Payer: Self-pay | Admitting: Vascular Surgery

## 2015-05-02 DIAGNOSIS — M109 Gout, unspecified: Secondary | ICD-10-CM | POA: Diagnosis not present

## 2015-05-02 DIAGNOSIS — N183 Chronic kidney disease, stage 3 (moderate): Secondary | ICD-10-CM | POA: Diagnosis not present

## 2015-05-05 DIAGNOSIS — I10 Essential (primary) hypertension: Secondary | ICD-10-CM | POA: Diagnosis not present

## 2015-05-05 DIAGNOSIS — M5489 Other dorsalgia: Secondary | ICD-10-CM | POA: Diagnosis not present

## 2015-05-05 DIAGNOSIS — R911 Solitary pulmonary nodule: Secondary | ICD-10-CM | POA: Diagnosis not present

## 2015-05-05 DIAGNOSIS — Z6827 Body mass index (BMI) 27.0-27.9, adult: Secondary | ICD-10-CM | POA: Diagnosis not present

## 2015-05-05 DIAGNOSIS — N183 Chronic kidney disease, stage 3 (moderate): Secondary | ICD-10-CM | POA: Diagnosis not present

## 2015-05-05 DIAGNOSIS — I2510 Atherosclerotic heart disease of native coronary artery without angina pectoris: Secondary | ICD-10-CM | POA: Diagnosis not present

## 2015-05-05 DIAGNOSIS — I739 Peripheral vascular disease, unspecified: Secondary | ICD-10-CM | POA: Diagnosis not present

## 2015-05-05 DIAGNOSIS — E1151 Type 2 diabetes mellitus with diabetic peripheral angiopathy without gangrene: Secondary | ICD-10-CM | POA: Diagnosis not present

## 2015-05-05 DIAGNOSIS — E785 Hyperlipidemia, unspecified: Secondary | ICD-10-CM | POA: Diagnosis not present

## 2015-05-05 DIAGNOSIS — E1129 Type 2 diabetes mellitus with other diabetic kidney complication: Secondary | ICD-10-CM | POA: Diagnosis not present

## 2015-05-10 ENCOUNTER — Other Ambulatory Visit: Payer: Self-pay | Admitting: Specialist

## 2015-05-10 DIAGNOSIS — Z85828 Personal history of other malignant neoplasm of skin: Secondary | ICD-10-CM | POA: Diagnosis not present

## 2015-05-10 DIAGNOSIS — C44319 Basal cell carcinoma of skin of other parts of face: Secondary | ICD-10-CM | POA: Diagnosis not present

## 2015-05-10 DIAGNOSIS — Z08 Encounter for follow-up examination after completed treatment for malignant neoplasm: Secondary | ICD-10-CM | POA: Diagnosis not present

## 2015-05-10 DIAGNOSIS — C44321 Squamous cell carcinoma of skin of nose: Secondary | ICD-10-CM | POA: Diagnosis not present

## 2015-05-26 ENCOUNTER — Other Ambulatory Visit: Payer: Self-pay | Admitting: Cardiovascular Disease

## 2015-06-02 DIAGNOSIS — C44321 Squamous cell carcinoma of skin of nose: Secondary | ICD-10-CM | POA: Diagnosis not present

## 2015-06-08 ENCOUNTER — Ambulatory Visit (INDEPENDENT_AMBULATORY_CARE_PROVIDER_SITE_OTHER): Payer: Medicare Other | Admitting: Cardiovascular Disease

## 2015-06-08 ENCOUNTER — Encounter: Payer: Self-pay | Admitting: Cardiovascular Disease

## 2015-06-08 VITALS — BP 146/58 | HR 67 | Ht 72.0 in | Wt 201.4 lb

## 2015-06-08 DIAGNOSIS — I2510 Atherosclerotic heart disease of native coronary artery without angina pectoris: Secondary | ICD-10-CM | POA: Diagnosis not present

## 2015-06-08 DIAGNOSIS — I739 Peripheral vascular disease, unspecified: Secondary | ICD-10-CM | POA: Diagnosis not present

## 2015-06-08 DIAGNOSIS — I255 Ischemic cardiomyopathy: Secondary | ICD-10-CM | POA: Diagnosis not present

## 2015-06-08 DIAGNOSIS — I6523 Occlusion and stenosis of bilateral carotid arteries: Secondary | ICD-10-CM | POA: Diagnosis not present

## 2015-06-08 DIAGNOSIS — I10 Essential (primary) hypertension: Secondary | ICD-10-CM

## 2015-06-08 NOTE — Progress Notes (Signed)
Chief Complaint  Patient presents with  . Follow-up      History of Present Illness: 64 yo male with history of CAD, DM, HTN, HLD, PAD, tobacco abuse here today for cardiac follow up. He has been followed in the past by Dr. Lia Foyer. He had an angioplasty of the LAD in the 1990s with directional atherectomy of the LAD in 1994. Cypher DES OM in 2003. Cypher DES LAD in 2004. Last cath December 2010 with patent LAD and OM stents, totally occluded RCA. The LAD had a mid 60% stenosis. The diagonal had a 50% stenosis. 30% stenosis OM. Stent. Dr. Lia Foyer managed him with medical therapy. Carotid dopplers December 2015 with less than 40% bilateral ICA stenosis. ABI March 2014 with 0.78 right, 0.69 left. Echo 11/08/13 with LVEF=55%, severe LVH, moderate LAE. I arranged a stress myoview in December 2015 and he cancelled.   He is here today for follow up. He stopped smoking. No chest pain or SOB. He is active and works full time.   Primary Care Physician: [name]  Last Lipid Profile: Followed in primary care   Past Medical History  Diagnosis Date  . Diabetes mellitus     type 2  . Peripheral vascular disease   . Hypertension   . Hyperlipemia   . Kidney disease     chronic; stage 2  . Osteoarthritis   . Tobacco abuse   . Coronary artery disease     PCA 1994l; PCI august 2003 of an obtust marginal. Catheterization at the time reveal: left main normal; LAD 50-75% between first and second diagonals, 50% after lesion; left circumflex 95% in obtuse marg. RCA 100% proximal.  . CAD (coronary artery disease)   . Myocardial infarction     Past Surgical History  Procedure Laterality Date  . Appendectomy    . Cholecystectomy    . Coronary angioplasty      stent times 2, last cath 3 years ago  . Nerve exploration  07/29/2012    Procedure: NERVE EXPLORATION;  Surgeon: Schuyler Amor, MD;  Location: [facility];  Service: Orthopedics;  Laterality: Left;  Exploration /repair nerve  and tendon left thumb  . Copd    . Peripheral vascular catheterization N/A 04/26/2015    Procedure: Abdominal Aortogram;  Surgeon: Rosetta Posner, MD;  Location: [facility];  Service: Cardiovascular;  Laterality: N/A;    Current Outpatient Prescriptions  Medication Sig Dispense Refill  . acetaminophen (TYLENOL) 500 MG tablet Take 1,000 mg by mouth daily as needed for pain.    [name] amLODipine (NORVASC) 10 MG tablet Take 10 mg by mouth daily.      [name] aspirin EC 81 MG tablet Take 81 mg by mouth every evening.    . benazepril (LOTENSIN) 40 MG tablet Take 40 mg by mouth 2 (two) times daily.     . Cinnamon 500 MG capsule Take 500 mg by mouth daily.    . clopidogrel (PLAVIX) 75 MG tablet TAKE ONE TABLET BY MOUTH ONCE DAILY 30 tablet 0  . fenofibrate (TRICOR) 48 MG tablet Take 48 mg by mouth daily.    . furosemide (LASIX) 80 MG tablet Take 160 mg by mouth 2 (two) times daily.     [name] glipiZIDE (GLUCOTROL XL) 5 MG 24 hr tablet Take 5 mg by mouth daily.    [name] losartan (COZAAR) 100 MG tablet Take 100 mg by mouth 2 (two) times daily.     . metFORMIN (GLUCOPHAGE) 1000  MG tablet Take 1,000 mg by mouth 2 (two) times daily with a meal.      . metoprolol (LOPRESSOR) 100 MG tablet Take 100 mg by mouth 2 (two) times daily.      . rosuvastatin (CRESTOR) 10 MG tablet Take 10 mg by mouth daily. Patient takes 1/2 tablet equalling 5 mg.    . sitaGLIPtin (JANUVIA) 100 MG tablet Take 50 mg by mouth daily.     [name] spironolactone (ALDACTONE) 25 MG tablet Take 12.5 mg by mouth 2 (two) times daily.   6  . TURMERIC PO Take 400 mg by mouth 2 (two) times daily.     No current facility-administered medications for this visit.    Allergies  Allergen Reactions  . Penicillins Other (See Comments)    Childhood reaction    History   Social History  . Marital Status: Married    Spouse Name: N/A  . Number of Children: N/A  . Years of Education: N/A   Occupational History  . Not on file.   Social History Main Topics    . Smoking status: Former Smoker -- 0.50 packs/day for 50 years    Types: Cigarettes    Quit date: 05/09/2015  . Smokeless tobacco: Not on file  . Alcohol Use: No  . Drug Use: No  . Sexual Activity: Not on file   Other Topics Concern  . Not on file   Social History Narrative   Has a 40-pack-year smoking history and currently smokes 1/2 pack per day.     Family History  Problem Relation Age of Onset  . Coronary artery disease Mother   . Heart disease Mother   . Cancer Father   . Stroke Other   . Heart failure Other   . Pulmonary embolism Other   . Diabetes Brother   . Hypertension Brother   . Heart attack Brother   . Peripheral vascular disease Brother   . Heart disease Brother   . Hypertension Sister     Review of Systems:  As stated in the HPI and otherwise negative.   BP 146/58 mmHg  Pulse 67  Ht 6' (1.829 m)  Wt 201 lb 6.4 oz (91.354 kg)  BMI 27.31 kg/m2  SpO2 95%  Physical Examination: General: Well developed, well nourished, NAD HEENT: OP clear, mucus membranes moist SKIN: warm, dry. No rashes. Neuro: No focal deficits Musculoskeletal: Muscle strength 5/5 all ext Psychiatric: Mood and affect normal Neck: No JVD, no carotid bruits, no thyromegaly, no lymphadenopathy. Lungs:Clear bilaterally, no wheezes, rhonci, crackles Cardiovascular: Regular rate and rhythm. No murmurs, gallops or rubs. Abdomen:Soft. Bowel sounds present. Non-tender.  Extremities: No lower extremity edema. Pulses are 1+ in the bilateral DP/PT.  Echo 11/08/13: Left ventricle: Inferobasal hypokinesis The cavity size was normal. Wall thickness was increased in a pattern of severe LVH. The estimated ejection fraction was 55%. - Aortic valve: There was mild stenosis. - Left atrium: The atrium was moderately dilated. - Atrial septum: No defect or patent foramen ovale was Identified.  EKG:  EKG is not ordered today. The ekg ordered today demonstrates   Recent Labs: 04/26/2015: BUN 62*;  Creatinine, Ser 2.30*; Hemoglobin 15.3; Potassium 4.7; Sodium 139   Lipid Panel   Wt Readings from Last 3 Encounters:  06/08/15 201 lb 6.4 oz (91.354 kg)  04/26/15 190 lb (86.183 kg)  04/04/15 196 lb 14.4 oz (89.313 kg)     Other studies Reviewed: Additional studies/ records that were reviewed today include: . Review of  the above records demonstrates:    Assessment and Plan:   1. CAD: Stable. Continue ASA, Plavix, statin, beta blocker. No chest pain. He does not wish to have a stress test.    2. PAD: Followed in [facility] by Dr. Donnetta Hutching. Recent angiogram June 2015 with left SFA occlusion. Plan for medical management.   3. Ischemic cardiomyopathy: Last LVEF=55% December 2014. Continue current meds.   4. HTN: BP has been elevated but is being followed closely by Nephrology (Dr. Jimmy Footman) and Dr. Joylene Draft. No changes today.   5. Carotid artery disease: Mild carotid artery disease, followed by Dr. Donnetta Hutching in [facility].   6. Tobacco abuse, in remission: He has stopped smoking.   Current medicines are reviewed at length with the patient today.  The patient does not have concerns regarding medicines.  The following changes have been made:  no change  Labs/ tests ordered today include:  No orders of the defined types were placed in this encounter.     Disposition:   FU with me in 12  months   Signed, Lauree Chandler, MD 06/08/2015 5:05 PM    [facility] [address], [place], [place]  [address] Phone: [phone]; Fax: [phone]

## 2015-06-08 NOTE — Patient Instructions (Signed)
Medication Instructions:  Your physician recommends that you continue on your current medications as directed. Please refer to the Current Medication list given to you today.   Labwork: none  Testing/Procedures: none  Follow-Up: Your physician wants you to follow-up in:  12 months.  You will receive a reminder letter in the mail two months in advance. If you don't receive a letter, please call our office to schedule the follow-up appointment.        

## 2015-06-25 ENCOUNTER — Other Ambulatory Visit: Payer: Self-pay | Admitting: Cardiovascular Disease

## 2015-07-25 DIAGNOSIS — I129 Hypertensive chronic kidney disease with stage 1 through stage 4 chronic kidney disease, or unspecified chronic kidney disease: Secondary | ICD-10-CM | POA: Diagnosis not present

## 2015-07-25 DIAGNOSIS — D631 Anemia in chronic kidney disease: Secondary | ICD-10-CM | POA: Diagnosis not present

## 2015-07-25 DIAGNOSIS — E1129 Type 2 diabetes mellitus with other diabetic kidney complication: Secondary | ICD-10-CM | POA: Diagnosis not present

## 2015-07-25 DIAGNOSIS — N183 Chronic kidney disease, stage 3 (moderate): Secondary | ICD-10-CM | POA: Diagnosis not present

## 2015-08-10 DIAGNOSIS — E1151 Type 2 diabetes mellitus with diabetic peripheral angiopathy without gangrene: Secondary | ICD-10-CM | POA: Diagnosis not present

## 2015-08-10 DIAGNOSIS — M5489 Other dorsalgia: Secondary | ICD-10-CM | POA: Diagnosis not present

## 2015-08-10 DIAGNOSIS — D649 Anemia, unspecified: Secondary | ICD-10-CM | POA: Diagnosis not present

## 2015-08-10 DIAGNOSIS — Z6829 Body mass index (BMI) 29.0-29.9, adult: Secondary | ICD-10-CM | POA: Diagnosis not present

## 2015-08-10 DIAGNOSIS — I10 Essential (primary) hypertension: Secondary | ICD-10-CM | POA: Diagnosis not present

## 2015-08-10 DIAGNOSIS — I2510 Atherosclerotic heart disease of native coronary artery without angina pectoris: Secondary | ICD-10-CM | POA: Diagnosis not present

## 2015-10-09 DIAGNOSIS — I2510 Atherosclerotic heart disease of native coronary artery without angina pectoris: Secondary | ICD-10-CM | POA: Diagnosis not present

## 2015-10-09 DIAGNOSIS — N183 Chronic kidney disease, stage 3 (moderate): Secondary | ICD-10-CM | POA: Diagnosis not present

## 2015-10-09 DIAGNOSIS — E785 Hyperlipidemia, unspecified: Secondary | ICD-10-CM | POA: Diagnosis not present

## 2015-10-09 DIAGNOSIS — E1151 Type 2 diabetes mellitus with diabetic peripheral angiopathy without gangrene: Secondary | ICD-10-CM | POA: Diagnosis not present

## 2015-10-09 DIAGNOSIS — E538 Deficiency of other specified B group vitamins: Secondary | ICD-10-CM | POA: Diagnosis not present

## 2015-10-09 DIAGNOSIS — Z125 Encounter for screening for malignant neoplasm of prostate: Secondary | ICD-10-CM | POA: Diagnosis not present

## 2015-10-16 DIAGNOSIS — E1151 Type 2 diabetes mellitus with diabetic peripheral angiopathy without gangrene: Secondary | ICD-10-CM | POA: Diagnosis not present

## 2015-10-16 DIAGNOSIS — Z1389 Encounter for screening for other disorder: Secondary | ICD-10-CM | POA: Diagnosis not present

## 2015-10-16 DIAGNOSIS — D6489 Other specified anemias: Secondary | ICD-10-CM | POA: Diagnosis not present

## 2015-10-16 DIAGNOSIS — I6529 Occlusion and stenosis of unspecified carotid artery: Secondary | ICD-10-CM | POA: Diagnosis not present

## 2015-10-16 DIAGNOSIS — D692 Other nonthrombocytopenic purpura: Secondary | ICD-10-CM | POA: Diagnosis not present

## 2015-10-16 DIAGNOSIS — Z6830 Body mass index (BMI) 30.0-30.9, adult: Secondary | ICD-10-CM | POA: Diagnosis not present

## 2015-10-16 DIAGNOSIS — M5489 Other dorsalgia: Secondary | ICD-10-CM | POA: Diagnosis not present

## 2015-10-16 DIAGNOSIS — N183 Chronic kidney disease, stage 3 (moderate): Secondary | ICD-10-CM | POA: Diagnosis not present

## 2015-10-16 DIAGNOSIS — Z0000 Encounter for general adult medical examination without abnormal findings: Secondary | ICD-10-CM | POA: Diagnosis not present

## 2015-10-16 DIAGNOSIS — R911 Solitary pulmonary nodule: Secondary | ICD-10-CM | POA: Diagnosis not present

## 2015-10-16 DIAGNOSIS — R252 Cramp and spasm: Secondary | ICD-10-CM | POA: Diagnosis not present

## 2015-10-16 DIAGNOSIS — I2510 Atherosclerotic heart disease of native coronary artery without angina pectoris: Secondary | ICD-10-CM | POA: Diagnosis not present

## 2015-10-17 ENCOUNTER — Encounter: Payer: Self-pay | Admitting: Cardiovascular Disease

## 2015-10-31 ENCOUNTER — Encounter: Payer: Medicare Other | Admitting: Vascular Surgery

## 2015-10-31 ENCOUNTER — Encounter (HOSPITAL_COMMUNITY): Payer: Medicare Other

## 2015-11-15 ENCOUNTER — Other Ambulatory Visit: Payer: Self-pay | Admitting: Cardiovascular Disease

## 2015-11-24 ENCOUNTER — Encounter: Payer: Self-pay | Admitting: Vascular Surgery

## 2015-11-28 ENCOUNTER — Ambulatory Visit (HOSPITAL_COMMUNITY)
Admission: RE | Admit: 2015-11-28 | Discharge: 2015-11-28 | Disposition: A | Discharge status: Home / Self Care | Payer: Medicare Other | Source: Ambulatory Visit | Attending: Vascular Surgery | Admitting: Vascular Surgery

## 2015-11-28 ENCOUNTER — Encounter: Payer: Self-pay | Admitting: Vascular Surgery

## 2015-11-28 ENCOUNTER — Ambulatory Visit (INDEPENDENT_AMBULATORY_CARE_PROVIDER_SITE_OTHER): Payer: Medicare Other | Admitting: Vascular Surgery

## 2015-11-28 VITALS — BP 143/74 | HR 59 | Temp 97.7°F | Resp 16 | Ht 72.0 in | Wt 217.0 lb

## 2015-11-28 DIAGNOSIS — I739 Peripheral vascular disease, unspecified: Secondary | ICD-10-CM | POA: Diagnosis not present

## 2015-11-28 DIAGNOSIS — I129 Hypertensive chronic kidney disease with stage 1 through stage 4 chronic kidney disease, or unspecified chronic kidney disease: Secondary | ICD-10-CM | POA: Diagnosis not present

## 2015-11-28 DIAGNOSIS — E1122 Type 2 diabetes mellitus with diabetic chronic kidney disease: Secondary | ICD-10-CM | POA: Diagnosis not present

## 2015-11-28 DIAGNOSIS — N182 Chronic kidney disease, stage 2 (mild): Secondary | ICD-10-CM | POA: Diagnosis not present

## 2015-11-28 DIAGNOSIS — E785 Hyperlipidemia, unspecified: Secondary | ICD-10-CM | POA: Insufficient documentation

## 2015-11-28 NOTE — Progress Notes (Signed)
Patient name: Travis [name] MRN: [id number] DOB: [birth year] Sex: male  REASON FOR VISIT: Follow-up of peripheral vascular occlusive disease  HPI: Travis [name] is a 65 y.o. male here today for discussion of his left calf claudication symptoms. He is well-known to me from prior evaluation of this including arteriogram. Arteriogram last year revealed superficial femoral artery occlusion with reconstitution of the popliteal artery and good three-vessel runoff. He reports that he continues to have difficulty with left calf claudication. Is not having any rest pain or tissue loss. He reports that he walks on a treadmill approximate 3 times per week I can go 10 minutes before he has to stop for calf discomfort. He is not pleased with this but does report that he is able to continue his usual daily activities without difficulty. Does not have any new cardiac difficulty and does not have any stroke symptoms.  Current Outpatient Prescriptions  Medication Sig Dispense Refill  . acetaminophen (TYLENOL) 500 MG tablet Take 1,000 mg by mouth daily as needed for pain.    [name] amLODipine (NORVASC) 10 MG tablet Take 10 mg by mouth daily.      [name] aspirin EC 81 MG tablet Take 81 mg by mouth every evening.    . benazepril (LOTENSIN) 40 MG tablet Take 40 mg by mouth 2 (two) times daily.     . Cinnamon 500 MG capsule Take 500 mg by mouth daily.    . clopidogrel (PLAVIX) 75 MG tablet TAKE ONE TABLET BY MOUTH ONCE DAILY 30 tablet 7  . furosemide (LASIX) 80 MG tablet Take 160 mg by mouth 2 (two) times daily.     [name] glipiZIDE (GLUCOTROL XL) 5 MG 24 hr tablet Take 5 mg by mouth daily.    [name] losartan (COZAAR) 100 MG tablet Take 100 mg by mouth 2 (two) times daily.     . metFORMIN (GLUCOPHAGE) 1000 MG tablet Take 1,000 mg by mouth 2 (two) times daily with a meal.      . metoprolol (LOPRESSOR) 100 MG tablet Take 100 mg by mouth 2 (two) times daily.      . rosuvastatin (CRESTOR) 10 MG tablet Take 10 mg by mouth daily. Patient takes 1/2  tablet equalling 5 mg.    . sitaGLIPtin (JANUVIA) 100 MG tablet Take 50 mg by mouth daily.     [name] spironolactone (ALDACTONE) 25 MG tablet Take 12.5 mg by mouth 2 (two) times daily.   6  . TURMERIC PO Take 400 mg by mouth 2 (two) times daily.    . fenofibrate (TRICOR) 48 MG tablet Take 48 mg by mouth daily. Reported on 11/28/2015     No current facility-administered medications for this visit.    REVIEW OF SYSTEMS:  '[X]'$  denotes positive finding, '[ ]'$  denotes negative finding Cardiac  Comments:  Chest pain or chest pressure:    Shortness of breath upon exertion:    Short of breath when lying flat:    Irregular heart rhythm:    Constitutional    Fever or chills:      PHYSICAL EXAM: Filed Vitals:   11/28/15 0945 11/28/15 0949  BP: 153/69 143/74  Pulse: 60 59  Temp: 97.7 F (36.5 C)   Resp: 16   Height: 6' (1.829 m)   Weight: 217 lb (98.431 kg)   SpO2: 99%     GENERAL: The patient is a well-nourished male, in no acute distress. The vital signs are documented above. CARDIOVASCULAR: There is a regular rate and  rhythm. PULMONARY: There is good air exchange bilaterally without wheezing or rales. Carotid arteries without bruits bilaterally Palpable femoral pulses bilaterally. I cannot palpate distal pulses bilaterally  MEDICAL ISSUES: He did undergo noninvasive vascular study in our office today. This does reveal ankle arm index of 0.7 on the right and 0.53 on the left. I explained that he certainly is having left calf claudication symptoms. Much less so on the right. He reports that he is able to tolerate this. Understands that he would require femoral-popliteal bypass correction of this. I certainly would recommend conservative treatment of this since he is not having any evidence of limb threatening ischemia. I did review his most recent carotid duplex in our office approximately 6 months ago. This showed no significant carotid disease bilaterally. I would not recommend ongoing carotid  surveillance and would not recommend any ongoing evaluation of his lower extremity arterial insufficiency unless he has progression of symptoms. He is comfortable with this and will let us know if he has any difficulty in the future  Jyra Lagares [facility] [employer] [facility]: [phone]

## 2015-11-28 NOTE — Progress Notes (Signed)
Filed Vitals:   11/28/15 0945 11/28/15 0949  BP: 153/69 143/74  Pulse: 60 59  Temp: 97.7 F (36.5 C)   Resp: 16   Height: 6' (1.829 m)   Weight: 217 lb (98.431 kg)   SpO2: 99%

## 2015-12-04 DIAGNOSIS — N2581 Secondary hyperparathyroidism of renal origin: Secondary | ICD-10-CM | POA: Diagnosis not present

## 2015-12-04 DIAGNOSIS — M109 Gout, unspecified: Secondary | ICD-10-CM | POA: Diagnosis not present

## 2015-12-04 DIAGNOSIS — N183 Chronic kidney disease, stage 3 (moderate): Secondary | ICD-10-CM | POA: Diagnosis not present

## 2015-12-04 DIAGNOSIS — F17200 Nicotine dependence, unspecified, uncomplicated: Secondary | ICD-10-CM | POA: Diagnosis not present

## 2015-12-04 DIAGNOSIS — E782 Mixed hyperlipidemia: Secondary | ICD-10-CM | POA: Diagnosis not present

## 2015-12-04 DIAGNOSIS — E1129 Type 2 diabetes mellitus with other diabetic kidney complication: Secondary | ICD-10-CM | POA: Diagnosis not present

## 2015-12-04 DIAGNOSIS — I129 Hypertensive chronic kidney disease with stage 1 through stage 4 chronic kidney disease, or unspecified chronic kidney disease: Secondary | ICD-10-CM | POA: Diagnosis not present

## 2015-12-04 DIAGNOSIS — I739 Peripheral vascular disease, unspecified: Secondary | ICD-10-CM | POA: Diagnosis not present

## 2015-12-04 DIAGNOSIS — D631 Anemia in chronic kidney disease: Secondary | ICD-10-CM | POA: Diagnosis not present

## 2015-12-04 DIAGNOSIS — I2510 Atherosclerotic heart disease of native coronary artery without angina pectoris: Secondary | ICD-10-CM | POA: Diagnosis not present

## 2015-12-27 DIAGNOSIS — I10 Essential (primary) hypertension: Secondary | ICD-10-CM | POA: Diagnosis not present

## 2015-12-27 DIAGNOSIS — E1159 Type 2 diabetes mellitus with other circulatory complications: Secondary | ICD-10-CM | POA: Diagnosis not present

## 2015-12-27 DIAGNOSIS — N183 Chronic kidney disease, stage 3 (moderate): Secondary | ICD-10-CM | POA: Diagnosis not present

## 2015-12-27 DIAGNOSIS — I739 Peripheral vascular disease, unspecified: Secondary | ICD-10-CM | POA: Diagnosis not present

## 2015-12-27 DIAGNOSIS — Z6830 Body mass index (BMI) 30.0-30.9, adult: Secondary | ICD-10-CM | POA: Diagnosis not present

## 2015-12-27 DIAGNOSIS — E1151 Type 2 diabetes mellitus with diabetic peripheral angiopathy without gangrene: Secondary | ICD-10-CM | POA: Diagnosis not present

## 2015-12-27 DIAGNOSIS — I2510 Atherosclerotic heart disease of native coronary artery without angina pectoris: Secondary | ICD-10-CM | POA: Diagnosis not present

## 2016-01-02 DIAGNOSIS — Z6830 Body mass index (BMI) 30.0-30.9, adult: Secondary | ICD-10-CM | POA: Diagnosis not present

## 2016-01-02 DIAGNOSIS — I10 Essential (primary) hypertension: Secondary | ICD-10-CM | POA: Diagnosis not present

## 2016-01-02 DIAGNOSIS — E1159 Type 2 diabetes mellitus with other circulatory complications: Secondary | ICD-10-CM | POA: Diagnosis not present

## 2016-01-02 DIAGNOSIS — N183 Chronic kidney disease, stage 3 (moderate): Secondary | ICD-10-CM | POA: Diagnosis not present

## 2016-01-22 DIAGNOSIS — Z6829 Body mass index (BMI) 29.0-29.9, adult: Secondary | ICD-10-CM | POA: Diagnosis not present

## 2016-01-22 DIAGNOSIS — I10 Essential (primary) hypertension: Secondary | ICD-10-CM | POA: Diagnosis not present

## 2016-01-22 DIAGNOSIS — E1159 Type 2 diabetes mellitus with other circulatory complications: Secondary | ICD-10-CM | POA: Diagnosis not present

## 2016-01-22 DIAGNOSIS — Z1389 Encounter for screening for other disorder: Secondary | ICD-10-CM | POA: Diagnosis not present

## 2016-01-22 DIAGNOSIS — I2510 Atherosclerotic heart disease of native coronary artery without angina pectoris: Secondary | ICD-10-CM | POA: Diagnosis not present

## 2016-02-13 DIAGNOSIS — E1159 Type 2 diabetes mellitus with other circulatory complications: Secondary | ICD-10-CM | POA: Diagnosis not present

## 2016-02-13 DIAGNOSIS — N183 Chronic kidney disease, stage 3 (moderate): Secondary | ICD-10-CM | POA: Diagnosis not present

## 2016-02-13 DIAGNOSIS — Z6829 Body mass index (BMI) 29.0-29.9, adult: Secondary | ICD-10-CM | POA: Diagnosis not present

## 2016-02-13 DIAGNOSIS — I10 Essential (primary) hypertension: Secondary | ICD-10-CM | POA: Diagnosis not present

## 2016-03-11 DIAGNOSIS — M109 Gout, unspecified: Secondary | ICD-10-CM | POA: Diagnosis not present

## 2016-03-11 DIAGNOSIS — I739 Peripheral vascular disease, unspecified: Secondary | ICD-10-CM | POA: Diagnosis not present

## 2016-03-11 DIAGNOSIS — E782 Mixed hyperlipidemia: Secondary | ICD-10-CM | POA: Diagnosis not present

## 2016-03-11 DIAGNOSIS — E1129 Type 2 diabetes mellitus with other diabetic kidney complication: Secondary | ICD-10-CM | POA: Diagnosis not present

## 2016-03-11 DIAGNOSIS — N183 Chronic kidney disease, stage 3 (moderate): Secondary | ICD-10-CM | POA: Diagnosis not present

## 2016-03-11 DIAGNOSIS — F17200 Nicotine dependence, unspecified, uncomplicated: Secondary | ICD-10-CM | POA: Diagnosis not present

## 2016-03-11 DIAGNOSIS — N2581 Secondary hyperparathyroidism of renal origin: Secondary | ICD-10-CM | POA: Diagnosis not present

## 2016-03-11 DIAGNOSIS — I2510 Atherosclerotic heart disease of native coronary artery without angina pectoris: Secondary | ICD-10-CM | POA: Diagnosis not present

## 2016-03-11 DIAGNOSIS — D631 Anemia in chronic kidney disease: Secondary | ICD-10-CM | POA: Diagnosis not present

## 2016-03-11 DIAGNOSIS — I129 Hypertensive chronic kidney disease with stage 1 through stage 4 chronic kidney disease, or unspecified chronic kidney disease: Secondary | ICD-10-CM | POA: Diagnosis not present

## 2016-04-17 DIAGNOSIS — E1151 Type 2 diabetes mellitus with diabetic peripheral angiopathy without gangrene: Secondary | ICD-10-CM | POA: Diagnosis not present

## 2016-04-17 DIAGNOSIS — I2510 Atherosclerotic heart disease of native coronary artery without angina pectoris: Secondary | ICD-10-CM | POA: Diagnosis not present

## 2016-04-17 DIAGNOSIS — I7389 Other specified peripheral vascular diseases: Secondary | ICD-10-CM | POA: Diagnosis not present

## 2016-04-17 DIAGNOSIS — J449 Chronic obstructive pulmonary disease, unspecified: Secondary | ICD-10-CM | POA: Diagnosis not present

## 2016-04-17 DIAGNOSIS — Z6829 Body mass index (BMI) 29.0-29.9, adult: Secondary | ICD-10-CM | POA: Diagnosis not present

## 2016-04-17 DIAGNOSIS — I10 Essential (primary) hypertension: Secondary | ICD-10-CM | POA: Diagnosis not present

## 2016-05-28 DIAGNOSIS — Z6829 Body mass index (BMI) 29.0-29.9, adult: Secondary | ICD-10-CM | POA: Diagnosis not present

## 2016-05-28 DIAGNOSIS — E1151 Type 2 diabetes mellitus with diabetic peripheral angiopathy without gangrene: Secondary | ICD-10-CM | POA: Diagnosis not present

## 2016-05-28 DIAGNOSIS — I10 Essential (primary) hypertension: Secondary | ICD-10-CM | POA: Diagnosis not present

## 2016-05-28 DIAGNOSIS — N183 Chronic kidney disease, stage 3 (moderate): Secondary | ICD-10-CM | POA: Diagnosis not present

## 2016-06-05 DIAGNOSIS — L570 Actinic keratosis: Secondary | ICD-10-CM | POA: Diagnosis not present

## 2016-06-05 DIAGNOSIS — D485 Neoplasm of uncertain behavior of skin: Secondary | ICD-10-CM | POA: Diagnosis not present

## 2016-06-05 DIAGNOSIS — D225 Melanocytic nevi of trunk: Secondary | ICD-10-CM | POA: Diagnosis not present

## 2016-06-05 DIAGNOSIS — L98499 Non-pressure chronic ulcer of skin of other sites with unspecified severity: Secondary | ICD-10-CM | POA: Diagnosis not present

## 2016-06-05 DIAGNOSIS — D1801 Hemangioma of skin and subcutaneous tissue: Secondary | ICD-10-CM | POA: Diagnosis not present

## 2016-06-05 DIAGNOSIS — Z85820 Personal history of malignant melanoma of skin: Secondary | ICD-10-CM | POA: Diagnosis not present

## 2016-06-05 DIAGNOSIS — L821 Other seborrheic keratosis: Secondary | ICD-10-CM | POA: Diagnosis not present

## 2016-06-05 DIAGNOSIS — Z85828 Personal history of other malignant neoplasm of skin: Secondary | ICD-10-CM | POA: Diagnosis not present

## 2016-06-05 DIAGNOSIS — L814 Other melanin hyperpigmentation: Secondary | ICD-10-CM | POA: Diagnosis not present

## 2016-07-05 DIAGNOSIS — L570 Actinic keratosis: Secondary | ICD-10-CM | POA: Diagnosis not present

## 2016-07-25 DIAGNOSIS — D631 Anemia in chronic kidney disease: Secondary | ICD-10-CM | POA: Diagnosis not present

## 2016-07-25 DIAGNOSIS — E782 Mixed hyperlipidemia: Secondary | ICD-10-CM | POA: Diagnosis not present

## 2016-07-25 DIAGNOSIS — N183 Chronic kidney disease, stage 3 (moderate): Secondary | ICD-10-CM | POA: Diagnosis not present

## 2016-07-25 DIAGNOSIS — N2581 Secondary hyperparathyroidism of renal origin: Secondary | ICD-10-CM | POA: Diagnosis not present

## 2016-07-25 DIAGNOSIS — I739 Peripheral vascular disease, unspecified: Secondary | ICD-10-CM | POA: Diagnosis not present

## 2016-07-25 DIAGNOSIS — E669 Obesity, unspecified: Secondary | ICD-10-CM | POA: Diagnosis not present

## 2016-07-25 DIAGNOSIS — I129 Hypertensive chronic kidney disease with stage 1 through stage 4 chronic kidney disease, or unspecified chronic kidney disease: Secondary | ICD-10-CM | POA: Diagnosis not present

## 2016-07-25 DIAGNOSIS — F17200 Nicotine dependence, unspecified, uncomplicated: Secondary | ICD-10-CM | POA: Diagnosis not present

## 2016-07-25 DIAGNOSIS — I2510 Atherosclerotic heart disease of native coronary artery without angina pectoris: Secondary | ICD-10-CM | POA: Diagnosis not present

## 2016-07-25 DIAGNOSIS — M109 Gout, unspecified: Secondary | ICD-10-CM | POA: Diagnosis not present

## 2016-07-25 DIAGNOSIS — E1129 Type 2 diabetes mellitus with other diabetic kidney complication: Secondary | ICD-10-CM | POA: Diagnosis not present

## 2016-08-01 ENCOUNTER — Other Ambulatory Visit: Payer: Self-pay | Admitting: Cardiovascular Disease

## 2016-08-22 DIAGNOSIS — J449 Chronic obstructive pulmonary disease, unspecified: Secondary | ICD-10-CM | POA: Diagnosis not present

## 2016-08-22 DIAGNOSIS — N183 Chronic kidney disease, stage 3 (moderate): Secondary | ICD-10-CM | POA: Diagnosis not present

## 2016-08-22 DIAGNOSIS — R21 Rash and other nonspecific skin eruption: Secondary | ICD-10-CM | POA: Diagnosis not present

## 2016-08-22 DIAGNOSIS — I10 Essential (primary) hypertension: Secondary | ICD-10-CM | POA: Diagnosis not present

## 2016-08-22 DIAGNOSIS — I2510 Atherosclerotic heart disease of native coronary artery without angina pectoris: Secondary | ICD-10-CM | POA: Diagnosis not present

## 2016-08-22 DIAGNOSIS — E1151 Type 2 diabetes mellitus with diabetic peripheral angiopathy without gangrene: Secondary | ICD-10-CM | POA: Diagnosis not present

## 2016-08-22 DIAGNOSIS — Z6829 Body mass index (BMI) 29.0-29.9, adult: Secondary | ICD-10-CM | POA: Diagnosis not present

## 2016-09-12 ENCOUNTER — Ambulatory Visit (INDEPENDENT_AMBULATORY_CARE_PROVIDER_SITE_OTHER): Payer: Medicare Other | Admitting: Cardiovascular Disease

## 2016-09-12 ENCOUNTER — Encounter (INDEPENDENT_AMBULATORY_CARE_PROVIDER_SITE_OTHER): Payer: Self-pay

## 2016-09-12 ENCOUNTER — Encounter: Payer: Self-pay | Admitting: Cardiovascular Disease

## 2016-09-12 VITALS — BP 148/60 | HR 78 | Ht 72.0 in | Wt 211.6 lb

## 2016-09-12 DIAGNOSIS — I6523 Occlusion and stenosis of bilateral carotid arteries: Secondary | ICD-10-CM | POA: Diagnosis not present

## 2016-09-12 DIAGNOSIS — I255 Ischemic cardiomyopathy: Secondary | ICD-10-CM

## 2016-09-12 DIAGNOSIS — I10 Essential (primary) hypertension: Secondary | ICD-10-CM | POA: Diagnosis not present

## 2016-09-12 DIAGNOSIS — I2510 Atherosclerotic heart disease of native coronary artery without angina pectoris: Secondary | ICD-10-CM | POA: Diagnosis not present

## 2016-09-12 DIAGNOSIS — F17201 Nicotine dependence, unspecified, in remission: Secondary | ICD-10-CM

## 2016-09-12 NOTE — Progress Notes (Signed)
Chief Complaint  Patient presents with  . Bleeding/Bruising     History of Present Illness: 65 yo male with history of CAD, DM, HTN, HLD, PAD, tobacco abuse here today for cardiac follow up. He has been followed in the past by Dr. Lia Foyer. He had an angioplasty of the LAD in the 1990s with directional atherectomy of the LAD in 1994. Cypher DES OM in 2003. Cypher DES LAD in 2004. Last cath December 2010 with patent LAD and OM stents, totally occluded RCA. The LAD had a mid 60% stenosis. The diagonal had a 50% stenosis. Dr. Lia Foyer managed him with medical therapy. His PAD is followed in [facility] by Dr. Donnetta Hutching. Carotid dopplers May 2016 with less than 49% bilateral ICA stenosis. ABI January 2017 0.79 right, 0.53 left. Echo 11/08/13 with LVEF=55%, severe LVH, moderate LAE. I arranged a stress myoview in December 2015 and he cancelled.   He is here today for follow up. No chest pain or SOB. He is active and now retired. He works out at [employer] several days per week. No leg pain.  No LE edema.   Primary Care Physician: Jerlyn Ly, MD   Past Medical History:  Diagnosis Date  . CAD (coronary artery disease)   . Coronary artery disease    PCA 1994l; PCI august 2003 of an obtust marginal. Catheterization at the time reveal: left main normal; LAD 50-75% between first and second diagonals, 50% after lesion; left circumflex 95% in obtuse marg. RCA 100% proximal.  . Diabetes mellitus    type 2  . Hyperlipemia   . Hypertension   . Kidney disease    chronic; stage 2  . Myocardial infarction   . Osteoarthritis   . Peripheral vascular disease ([place])   . Tobacco abuse     Past Surgical History:  Procedure Laterality Date  . APPENDECTOMY    . CHOLECYSTECTOMY    . COPD    . CORONARY ANGIOPLASTY     stent times 2, last cath 3 years ago  . NERVE EXPLORATION  07/29/2012   Procedure: NERVE EXPLORATION;  Surgeon: Schuyler Amor, MD;  Location: [facility];  Service: Orthopedics;   Laterality: Left;  Exploration /repair nerve and tendon left thumb  . PERIPHERAL VASCULAR CATHETERIZATION N/A 04/26/2015   Procedure: Abdominal Aortogram;  Surgeon: Rosetta Posner, MD;  Location: [facility];  Service: Cardiovascular;  Laterality: N/A;    Current Outpatient Prescriptions  Medication Sig Dispense Refill  . acetaminophen (TYLENOL) 500 MG tablet Take 1,000 mg by mouth daily as needed for pain.    [name] allopurinol (ZYLOPRIM) 300 MG tablet Take 300 mg by mouth daily.    [name] amLODipine (NORVASC) 10 MG tablet Take 10 mg by mouth daily.      . benazepril (LOTENSIN) 40 MG tablet Take 40 mg by mouth 2 (two) times daily.     . Cinnamon 500 MG capsule Take 500 mg by mouth daily.    . clopidogrel (PLAVIX) 75 MG tablet TAKE ONE TABLET BY MOUTH ONCE DAILY 30 tablet 1  . furosemide (LASIX) 80 MG tablet Take 160 mg by mouth 2 (two) times daily.     . insulin lispro (HUMALOG) 100 UNIT/ML injection Inject 9 Units into the skin 2 (two) times daily before lunch and supper.    . losartan (COZAAR) 100 MG tablet Take 100 mg by mouth 2 (two) times daily.     . metoprolol (LOPRESSOR) 100 MG tablet Take 100 mg by  mouth 2 (two) times daily.      . mirtazapine (REMERON) 15 MG tablet Take 15 mg by mouth at bedtime.    . rosuvastatin (CRESTOR) 10 MG tablet Take 5 mg by mouth daily.    [name] spironolactone (ALDACTONE) 25 MG tablet Take 25 mg by mouth 2 (two) times daily.    Nelva Nay SOLOSTAR 300 UNIT/ML SOPN Inject 19 Units into the skin daily before breakfast.    . TURMERIC PO Take 400 mg by mouth 2 (two) times daily.     No current facility-administered medications for this visit.     Allergies  Allergen Reactions  . Penicillins Other (See Comments)    Childhood reaction    Social History   Social History  . Marital status: Married    Spouse name: N/A  . Number of children: N/A  . Years of education: N/A   Occupational History  . Not on file.   Social History Main Topics  . Smoking status:  Former Smoker    Packs/day: 0.50    Years: 50.00    Types: Cigarettes    Quit date: 05/09/2015  . Smokeless tobacco: Former Systems developer    Types: Chew  . Alcohol use No  . Drug use: No  . Sexual activity: Not on file   Other Topics Concern  . Not on file   Social History Narrative   Has a 40-pack-year smoking history and currently smokes 1/2 pack per day.     Family History  Problem Relation Age of Onset  . Coronary artery disease Mother   . Heart disease Mother   . Cancer Father   . Stroke Other   . Heart failure Other   . Pulmonary embolism Other   . Diabetes Brother   . Hypertension Brother   . Heart attack Brother   . Peripheral vascular disease Brother   . Heart disease Brother   . Hypertension Sister     Review of Systems:  As stated in the HPI and otherwise negative.   BP (!) 148/60   Pulse 78   Ht 6' (1.829 m)   Wt 211 lb 9.6 oz (96 kg)   BMI 28.70 kg/m   Physical Examination: General: Well developed, well nourished, NAD  HEENT: OP clear, mucus membranes moist  SKIN: warm, dry. No rashes. Neuro: No focal deficits  Musculoskeletal: Muscle strength 5/5 all ext  Psychiatric: Mood and affect normal  Neck: No JVD, no carotid bruits, no thyromegaly, no lymphadenopathy.  Lungs:Clear bilaterally, no wheezes, rhonci, crackles Cardiovascular: Regular rate and rhythm. No murmurs, gallops or rubs. Abdomen:Soft. Bowel sounds present. Non-tender.  Extremities: No lower extremity edema. Pulses are 1+ in the bilateral DP/PT.  Echo 11/08/13: Left ventricle: Inferobasal hypokinesis The cavity size was normal. Wall thickness was increased in a pattern of severe LVH. The estimated ejection fraction was 55%. - Aortic valve: There was mild stenosis. - Left atrium: The atrium was moderately dilated. - Atrial septum: No defect or patent foramen ovale was Identified.  EKG:  EKG is ordered today. The ekg ordered today demonstrates Sinus bradycardia, rate 53 bpm. Non-specific  IVCD.   Recent Labs: No results found for requested labs within last 8760 hours.   Lipid Panel   Wt Readings from Last 3 Encounters:  09/12/16 211 lb 9.6 oz (96 kg)  11/28/15 217 lb (98.4 kg)  06/08/15 201 lb 6.4 oz (91.4 kg)     Other studies Reviewed: Additional studies/ records that were reviewed today include: .  Review of the above records demonstrates:    Assessment and Plan:   1. CAD without angina: No chest pain suggestive of angina. Continue Plavix, statin, beta blocker. Will stop ASA with easy bruising.   2. PAD: Followed in [facility] by Dr. Donnetta Hutching. LE angiogram June 2015 with left SFA occlusion. Stable ABI January 2017. Plan for medical management.   3. Ischemic cardiomyopathy: Last LVEF=55% December 2014. Continue current meds.   4. HTN: BP has been well controlled per pt. This is being followed closely by Nephrology (Dr. Jimmy Footman) and Dr. Joylene Draft. No changes today.   5. Carotid artery disease: Mild carotid artery disease, followed by Dr. Donnetta Hutching in [facility].   6. Tobacco abuse, in remission: He has stopped smoking.   Current medicines are reviewed at length with the patient today.  The patient does not have concerns regarding medicines.  The following changes have been made:  no change  Labs/ tests ordered today include:   Orders Placed This Encounter  Procedures  . EKG 12-Lead     Disposition:   FU with me in 12  months   Signed, Lauree Chandler, MD 09/12/2016 2:10 PM    [facility] [address], [place], [place]  [address] Phone: [phone]; Fax: [phone]

## 2016-09-12 NOTE — Patient Instructions (Signed)
Medication Instructions:   Your physician has recommended you make the following change in your medication: Stop Aspirin    Labwork: none  Testing/Procedures: none  Follow-Up: Your physician recommends that you schedule a follow-up appointment in: 12 months. Call us next summer--around July 2018 to schedule this appointment.     Any Other Special Instructions Will Be Listed Below (If Applicable).     If you need a refill on your cardiac medications before your next appointment, please call your pharmacy.

## 2016-09-25 DIAGNOSIS — E1151 Type 2 diabetes mellitus with diabetic peripheral angiopathy without gangrene: Secondary | ICD-10-CM | POA: Diagnosis not present

## 2016-09-25 DIAGNOSIS — I10 Essential (primary) hypertension: Secondary | ICD-10-CM | POA: Diagnosis not present

## 2016-09-25 DIAGNOSIS — Z6829 Body mass index (BMI) 29.0-29.9, adult: Secondary | ICD-10-CM | POA: Diagnosis not present

## 2016-09-25 DIAGNOSIS — N183 Chronic kidney disease, stage 3 (moderate): Secondary | ICD-10-CM | POA: Diagnosis not present

## 2016-09-30 ENCOUNTER — Other Ambulatory Visit: Payer: Self-pay | Admitting: Cardiovascular Disease

## 2016-10-31 DIAGNOSIS — N2581 Secondary hyperparathyroidism of renal origin: Secondary | ICD-10-CM | POA: Diagnosis not present

## 2016-10-31 DIAGNOSIS — M109 Gout, unspecified: Secondary | ICD-10-CM | POA: Diagnosis not present

## 2016-10-31 DIAGNOSIS — E782 Mixed hyperlipidemia: Secondary | ICD-10-CM | POA: Diagnosis not present

## 2016-10-31 DIAGNOSIS — I739 Peripheral vascular disease, unspecified: Secondary | ICD-10-CM | POA: Diagnosis not present

## 2016-10-31 DIAGNOSIS — F17200 Nicotine dependence, unspecified, uncomplicated: Secondary | ICD-10-CM | POA: Diagnosis not present

## 2016-10-31 DIAGNOSIS — D631 Anemia in chronic kidney disease: Secondary | ICD-10-CM | POA: Diagnosis not present

## 2016-10-31 DIAGNOSIS — E669 Obesity, unspecified: Secondary | ICD-10-CM | POA: Diagnosis not present

## 2016-10-31 DIAGNOSIS — N183 Chronic kidney disease, stage 3 (moderate): Secondary | ICD-10-CM | POA: Diagnosis not present

## 2016-10-31 DIAGNOSIS — I2510 Atherosclerotic heart disease of native coronary artery without angina pectoris: Secondary | ICD-10-CM | POA: Diagnosis not present

## 2016-10-31 DIAGNOSIS — I129 Hypertensive chronic kidney disease with stage 1 through stage 4 chronic kidney disease, or unspecified chronic kidney disease: Secondary | ICD-10-CM | POA: Diagnosis not present

## 2016-10-31 DIAGNOSIS — E1129 Type 2 diabetes mellitus with other diabetic kidney complication: Secondary | ICD-10-CM | POA: Diagnosis not present

## 2016-11-14 ENCOUNTER — Encounter: Payer: Self-pay | Admitting: Cardiovascular Disease

## 2016-11-14 DIAGNOSIS — N183 Chronic kidney disease, stage 3 (moderate): Secondary | ICD-10-CM | POA: Diagnosis not present

## 2016-11-14 DIAGNOSIS — Z125 Encounter for screening for malignant neoplasm of prostate: Secondary | ICD-10-CM | POA: Diagnosis not present

## 2016-11-14 DIAGNOSIS — E538 Deficiency of other specified B group vitamins: Secondary | ICD-10-CM | POA: Diagnosis not present

## 2016-11-14 DIAGNOSIS — E784 Other hyperlipidemia: Secondary | ICD-10-CM | POA: Diagnosis not present

## 2016-11-14 DIAGNOSIS — D519 Vitamin B12 deficiency anemia, unspecified: Secondary | ICD-10-CM | POA: Diagnosis not present

## 2016-11-14 DIAGNOSIS — E1151 Type 2 diabetes mellitus with diabetic peripheral angiopathy without gangrene: Secondary | ICD-10-CM | POA: Diagnosis not present

## 2016-11-21 DIAGNOSIS — E1151 Type 2 diabetes mellitus with diabetic peripheral angiopathy without gangrene: Secondary | ICD-10-CM | POA: Diagnosis not present

## 2016-11-21 DIAGNOSIS — I2510 Atherosclerotic heart disease of native coronary artery without angina pectoris: Secondary | ICD-10-CM | POA: Diagnosis not present

## 2016-11-21 DIAGNOSIS — E1129 Type 2 diabetes mellitus with other diabetic kidney complication: Secondary | ICD-10-CM | POA: Diagnosis not present

## 2016-11-21 DIAGNOSIS — D692 Other nonthrombocytopenic purpura: Secondary | ICD-10-CM | POA: Diagnosis not present

## 2016-11-21 DIAGNOSIS — N183 Chronic kidney disease, stage 3 (moderate): Secondary | ICD-10-CM | POA: Diagnosis not present

## 2016-11-21 DIAGNOSIS — Z1389 Encounter for screening for other disorder: Secondary | ICD-10-CM | POA: Diagnosis not present

## 2016-11-21 DIAGNOSIS — J449 Chronic obstructive pulmonary disease, unspecified: Secondary | ICD-10-CM | POA: Diagnosis not present

## 2016-11-21 DIAGNOSIS — Z0000 Encounter for general adult medical examination without abnormal findings: Secondary | ICD-10-CM | POA: Diagnosis not present

## 2016-11-21 DIAGNOSIS — I7389 Other specified peripheral vascular diseases: Secondary | ICD-10-CM | POA: Diagnosis not present

## 2016-11-21 DIAGNOSIS — R808 Other proteinuria: Secondary | ICD-10-CM | POA: Diagnosis not present

## 2016-11-21 DIAGNOSIS — Z6828 Body mass index (BMI) 28.0-28.9, adult: Secondary | ICD-10-CM | POA: Diagnosis not present

## 2016-11-21 DIAGNOSIS — D6489 Other specified anemias: Secondary | ICD-10-CM | POA: Diagnosis not present

## 2016-11-28 DIAGNOSIS — Z1212 Encounter for screening for malignant neoplasm of rectum: Secondary | ICD-10-CM | POA: Diagnosis not present

## 2017-01-01 DIAGNOSIS — N183 Chronic kidney disease, stage 3 (moderate): Secondary | ICD-10-CM | POA: Diagnosis not present

## 2017-01-01 DIAGNOSIS — I10 Essential (primary) hypertension: Secondary | ICD-10-CM | POA: Diagnosis not present

## 2017-01-01 DIAGNOSIS — E1129 Type 2 diabetes mellitus with other diabetic kidney complication: Secondary | ICD-10-CM | POA: Diagnosis not present

## 2017-01-01 DIAGNOSIS — Z6828 Body mass index (BMI) 28.0-28.9, adult: Secondary | ICD-10-CM | POA: Diagnosis not present

## 2017-03-10 DIAGNOSIS — N183 Chronic kidney disease, stage 3 (moderate): Secondary | ICD-10-CM | POA: Diagnosis not present

## 2017-03-10 DIAGNOSIS — I739 Peripheral vascular disease, unspecified: Secondary | ICD-10-CM | POA: Diagnosis not present

## 2017-03-10 DIAGNOSIS — E782 Mixed hyperlipidemia: Secondary | ICD-10-CM | POA: Diagnosis not present

## 2017-03-10 DIAGNOSIS — I2510 Atherosclerotic heart disease of native coronary artery without angina pectoris: Secondary | ICD-10-CM | POA: Diagnosis not present

## 2017-03-10 DIAGNOSIS — E669 Obesity, unspecified: Secondary | ICD-10-CM | POA: Diagnosis not present

## 2017-03-10 DIAGNOSIS — N2581 Secondary hyperparathyroidism of renal origin: Secondary | ICD-10-CM | POA: Diagnosis not present

## 2017-03-10 DIAGNOSIS — I129 Hypertensive chronic kidney disease with stage 1 through stage 4 chronic kidney disease, or unspecified chronic kidney disease: Secondary | ICD-10-CM | POA: Diagnosis not present

## 2017-03-10 DIAGNOSIS — D631 Anemia in chronic kidney disease: Secondary | ICD-10-CM | POA: Diagnosis not present

## 2017-03-10 DIAGNOSIS — M109 Gout, unspecified: Secondary | ICD-10-CM | POA: Diagnosis not present

## 2017-03-10 DIAGNOSIS — E1129 Type 2 diabetes mellitus with other diabetic kidney complication: Secondary | ICD-10-CM | POA: Diagnosis not present

## 2017-03-10 DIAGNOSIS — F17200 Nicotine dependence, unspecified, uncomplicated: Secondary | ICD-10-CM | POA: Diagnosis not present

## 2017-03-27 DIAGNOSIS — E1129 Type 2 diabetes mellitus with other diabetic kidney complication: Secondary | ICD-10-CM | POA: Diagnosis not present

## 2017-03-27 DIAGNOSIS — E1151 Type 2 diabetes mellitus with diabetic peripheral angiopathy without gangrene: Secondary | ICD-10-CM | POA: Diagnosis not present

## 2017-03-27 DIAGNOSIS — J449 Chronic obstructive pulmonary disease, unspecified: Secondary | ICD-10-CM | POA: Diagnosis not present

## 2017-03-27 DIAGNOSIS — I2510 Atherosclerotic heart disease of native coronary artery without angina pectoris: Secondary | ICD-10-CM | POA: Diagnosis not present

## 2017-03-27 DIAGNOSIS — I10 Essential (primary) hypertension: Secondary | ICD-10-CM | POA: Diagnosis not present

## 2017-03-27 DIAGNOSIS — I7389 Other specified peripheral vascular diseases: Secondary | ICD-10-CM | POA: Diagnosis not present

## 2017-03-27 DIAGNOSIS — Z6827 Body mass index (BMI) 27.0-27.9, adult: Secondary | ICD-10-CM | POA: Diagnosis not present

## 2017-05-20 DIAGNOSIS — Z6829 Body mass index (BMI) 29.0-29.9, adult: Secondary | ICD-10-CM | POA: Diagnosis not present

## 2017-05-20 DIAGNOSIS — N183 Chronic kidney disease, stage 3 (moderate): Secondary | ICD-10-CM | POA: Diagnosis not present

## 2017-05-20 DIAGNOSIS — E1129 Type 2 diabetes mellitus with other diabetic kidney complication: Secondary | ICD-10-CM | POA: Diagnosis not present

## 2017-05-20 DIAGNOSIS — I10 Essential (primary) hypertension: Secondary | ICD-10-CM | POA: Diagnosis not present

## 2017-07-28 DIAGNOSIS — I10 Essential (primary) hypertension: Secondary | ICD-10-CM | POA: Diagnosis not present

## 2017-07-28 DIAGNOSIS — J449 Chronic obstructive pulmonary disease, unspecified: Secondary | ICD-10-CM | POA: Diagnosis not present

## 2017-07-28 DIAGNOSIS — N183 Chronic kidney disease, stage 3 (moderate): Secondary | ICD-10-CM | POA: Diagnosis not present

## 2017-07-28 DIAGNOSIS — I2510 Atherosclerotic heart disease of native coronary artery without angina pectoris: Secondary | ICD-10-CM | POA: Diagnosis not present

## 2017-07-28 DIAGNOSIS — E1129 Type 2 diabetes mellitus with other diabetic kidney complication: Secondary | ICD-10-CM | POA: Diagnosis not present

## 2017-07-28 DIAGNOSIS — Z6829 Body mass index (BMI) 29.0-29.9, adult: Secondary | ICD-10-CM | POA: Diagnosis not present

## 2017-07-28 DIAGNOSIS — E1151 Type 2 diabetes mellitus with diabetic peripheral angiopathy without gangrene: Secondary | ICD-10-CM | POA: Diagnosis not present

## 2017-08-08 DIAGNOSIS — I739 Peripheral vascular disease, unspecified: Secondary | ICD-10-CM | POA: Diagnosis not present

## 2017-08-08 DIAGNOSIS — I129 Hypertensive chronic kidney disease with stage 1 through stage 4 chronic kidney disease, or unspecified chronic kidney disease: Secondary | ICD-10-CM | POA: Diagnosis not present

## 2017-08-08 DIAGNOSIS — E669 Obesity, unspecified: Secondary | ICD-10-CM | POA: Diagnosis not present

## 2017-08-08 DIAGNOSIS — E1129 Type 2 diabetes mellitus with other diabetic kidney complication: Secondary | ICD-10-CM | POA: Diagnosis not present

## 2017-08-08 DIAGNOSIS — N2581 Secondary hyperparathyroidism of renal origin: Secondary | ICD-10-CM | POA: Diagnosis not present

## 2017-08-08 DIAGNOSIS — N183 Chronic kidney disease, stage 3 (moderate): Secondary | ICD-10-CM | POA: Diagnosis not present

## 2017-08-08 DIAGNOSIS — I2510 Atherosclerotic heart disease of native coronary artery without angina pectoris: Secondary | ICD-10-CM | POA: Diagnosis not present

## 2017-08-08 DIAGNOSIS — M109 Gout, unspecified: Secondary | ICD-10-CM | POA: Diagnosis not present

## 2017-08-08 DIAGNOSIS — D631 Anemia in chronic kidney disease: Secondary | ICD-10-CM | POA: Diagnosis not present

## 2017-08-08 DIAGNOSIS — E782 Mixed hyperlipidemia: Secondary | ICD-10-CM | POA: Diagnosis not present

## 2017-09-04 ENCOUNTER — Other Ambulatory Visit: Payer: Self-pay | Admitting: *Deleted

## 2017-09-04 ENCOUNTER — Encounter: Payer: Self-pay | Admitting: Cardiothoracic Surgery

## 2017-09-04 ENCOUNTER — Institutional Professional Consult (permissible substitution) (INDEPENDENT_AMBULATORY_CARE_PROVIDER_SITE_OTHER): Payer: Medicare Other | Admitting: Cardiothoracic Surgery

## 2017-09-04 VITALS — BP 165/78 | HR 55 | Ht 72.0 in | Wt 210.0 lb

## 2017-09-04 DIAGNOSIS — R918 Other nonspecific abnormal finding of lung field: Secondary | ICD-10-CM

## 2017-09-04 DIAGNOSIS — R911 Solitary pulmonary nodule: Secondary | ICD-10-CM

## 2017-09-04 NOTE — Progress Notes (Signed)
[name]       ,Travis [name] [id number]             [phone]                    Travis [name] [place] Medical Record #[id number] Date of Birth: July 02, 1951  Referring: [name], PA-C Primary Care: [name], MD  Chief Complaint:    Chief Complaint  Patient presents with  . New Patient (Initial Visit)    Lung Nodule, PET 08/19/2017    History of Present Illness:    Travis [name] 66 y.o. male is seen in the office  today for abnormal lung cancer screening CT. the patient notes that several years ago began having lung cancer screening CT is done through the [place]. recently repeat scan was performed and he was told of lesion on the scan "looked mushy".  PET scan was done through the [place] system and the patient was referred to thoracic surgery.  The patient gives a history of smoking for many years since age 25, up to a pack a day over that time.  Most recently he quit 2 years ago -05/09/2015  Notes his past history is significant for multiple cardiac stents in 2000. ,  Last cath was approximately 8 years ago.  He has a history of diabetes mellitus and chronic renal insufficiency.   Current Activity/ Functional Status:  Patient is independent with mobility/ambulation, transfers, ADL's, IADL's.   Zubrod Score: At the time of surgery this patient's most appropriate activity status/level should be described as: []     0    Normal activity, no symptoms [x]     1    Restricted in physical strenuous activity but ambulatory, able to do out light work []     2    Ambulatory and capable of self care, unable to do work activities, up and about               >50 % of waking hours                              []     3    Only limited self care, in bed greater than 50% of waking hours []     4    Completely disabled, no self care, confined to bed or chair []     5    Moribund Patient notes that he is limited in ambulation because "his legs lock up".  Past Medical History:    Diagnosis Date  . CAD (coronary artery disease)   . Coronary artery disease    PCA 1994l; PCI august 2003 of an obtust marginal. Catheterization at the time reveal: left main normal; LAD 50-75% between first and second diagonals, 50% after lesion; left circumflex 95% in obtuse marg. RCA 100% proximal.  . Diabetes mellitus    type 2  . Hyperlipemia   . Hypertension   . Kidney disease    chronic; stage 2  . Myocardial infarction ([place])   . Osteoarthritis   . Peripheral vascular disease ([place])   . Tobacco abuse     Past Surgical History:  Procedure Laterality Date  . APPENDECTOMY    . CHOLECYSTECTOMY    . COPD    . CORONARY ANGIOPLASTY     stent times 2, last cath 3 years ago  . NERVE EXPLORATION  07/29/2012   Procedure: NERVE EXPLORATION;  Surgeon: Schuyler Amor, MD;  Location: [facility];  Service: Orthopedics;  Laterality: Left;  Exploration /repair nerve and tendon left thumb  . PERIPHERAL VASCULAR CATHETERIZATION N/A 04/26/2015   Procedure: Abdominal Aortogram;  Surgeon: Rosetta Posner, MD;  Location: [facility];  Service: Cardiovascular;  Laterality: N/A;    Family History  Problem Relation Age of Onset  . Coronary artery disease Mother   . Heart disease Mother   . Cancer Father   . Stroke Other   . Heart failure Other   . Pulmonary embolism Other   . Diabetes Brother   . Hypertension Brother   . Heart attack Brother   . Peripheral vascular disease Brother   . Heart disease Brother   . Hypertension Sister       History  Smoking Status  . Former Smoker  . Packs/day: 0.50  . Years: 50.00  . Types: Cigarettes  . Quit date: 05/09/2015  Smokeless Tobacco  . Former Systems developer  . Types: Chew    History  Alcohol Use No     Allergies  Allergen Reactions  . Penicillins Other (See Comments)    Childhood reaction    Current Outpatient Prescriptions  Medication Sig Dispense Refill  . acetaminophen (TYLENOL) 500 MG tablet Take 1,000 mg by  mouth daily as needed for pain.    [name] amLODipine (NORVASC) 10 MG tablet Take 10 mg by mouth daily.      [name] aspirin 81 MG tablet Take 81 mg by mouth daily.    . benazepril (LOTENSIN) 40 MG tablet Take 40 mg by mouth 2 (two) times daily.     . Cinnamon 500 MG capsule Take 500 mg by mouth daily.    . clopidogrel (PLAVIX) 75 MG tablet TAKE ONE TABLET BY MOUTH ONCE DAILY 90 tablet 3  . furosemide (LASIX) 80 MG tablet Take 160 mg by mouth 2 (two) times daily.     . insulin lispro (HUMALOG) 100 UNIT/ML injection Inject 9 Units into the skin 2 (two) times daily before lunch and supper.    . losartan (COZAAR) 100 MG tablet Take 100 mg by mouth 2 (two) times daily.     . metoprolol (LOPRESSOR) 100 MG tablet Take 100 mg by mouth 2 (two) times daily.      . mirtazapine (REMERON) 15 MG tablet Take 15 mg by mouth at bedtime.    . rosuvastatin (CRESTOR) 10 MG tablet Take 5 mg by mouth daily.    Nelva Nay SOLOSTAR 300 UNIT/ML SOPN Inject 19 Units into the skin daily before breakfast.    . TURMERIC PO Take 400 mg by mouth 2 (two) times daily.     No current facility-administered medications for this visit.     Pertinent items are noted in HPI.   Review of Systems:     Cardiac Review of Systems: Y or N  Chest Pain [ N   ]  Resting SOB [ Y  ] Exertional SOB  [ Y ]  Orthopnea [ N ]   Pedal Edema Aqua.Slicker   ]    Palpitations [ N ] Syncope  [ N ]   Presyncope [ N  ]  General Review of Systems: [Y] = yes [  ]=no Constitional: recent weight change Aqua.Slicker  ];  Wt loss over the last 3 months [   ] anorexia [  ]; fatigue [  ]; nausea [  ]; night sweats [  ]; fever [  ];  or chills [  ];          Dental: poor dentition[Y  ]; Last Dentist visit:   Eye : blurred vision [  ]; diplopia [   ]; vision changes [  ];  Amaurosis fugax[  ]; Resp: cough [  ];  wheezing[  ];  hemoptysis[  ]; shortness of breath[  ]; paroxysmal nocturnal dyspnea[  ]; dyspnea on exertion[  ]; or orthopnea[  ];  GI:  gallstones[  ], vomiting[  ];  dysphagia[   ]; melena[  ];  hematochezia [  ]; heartburn[  ];   Hx of  Colonoscopy[  ]; GU: kidney stones [  ]; hematuria[  ];   dysuria [  ];  nocturia[  ];  history of     obstruction [  ]; urinary frequency [Y  ]             Skin: rash, swelling[  ];, hair loss[  ];  peripheral edema[  ];  or itching[  ]; Musculosketetal: myalgias[  ];  joint swelling[  ];  joint erythema[  ];  joint pain[  ];  back pain[  ];  Heme/Lymph: bruising[ Y ];  bleeding[ YES ON PLAVIX  ];  anemia[  ];  Neuro: TIA[ N ];  headaches[  ];  stroke[  ];  vertigo[  ];  seizures[  ];   paresthesias[  ];  difficulty walking[  ];  Psych:depression[  ]; anxiety[  ];  Endocrine: diabetes[Y  ];  thyroid dysfunction[ N ];  Immunizations: Flu up to date [ N ]; Pneumococcal up to date [ Y ];  Other:  Physical Exam: BP (!) 165/78   Pulse (!) 55   Ht 6' (1.829 m)   Wt 210 lb (95.3 kg)   SpO2 93%   BMI 28.48 kg/m   PHYSICAL EXAMINATION: General appearance: alert, cooperative, appears older than stated age and no distress Head: Normocephalic, without obvious abnormality, atraumatic Neck: no adenopathy, no carotid bruit, no JVD, supple, symmetrical, trachea midline and thyroid not enlarged, symmetric, no tenderness/mass/nodules Lymph nodes: Cervical, supraclavicular, and axillary nodes normal. Resp: diminished breath sounds bibasilar Back: symmetric, no curvature. ROM normal. No CVA tenderness. Cardio: regular rate and rhythm, S1, S2 normal, no murmur, click, rub or gallop GI: soft, non-tender; bowel sounds normal; no masses,  no organomegaly Extremities: extremities normal, atraumatic, no cyanosis or edema and Homans sign is negative, no sign of DVT Neurologic: Grossly normal  Diagnostic Studies & Laboratory data:     Recent Radiology Findings: Description:  CT OF THE CHEST, LUNG CANCER SCREENING   HISTORY: lung cancer screening   COMPARISON: 08/05/2016   TECHNIQUE: CT images of the chest were obtained without intravenous    contrast utilizing low dose technique.   FINDINGS:   Lung nodules, measured on series 2: Left lower lobe nodular opacity is  similar in measured size but increased in soft tissue bulk, measuring 17 x  21 mm image 171, and there is a new nodular component contacting the  inferior pleura measuring 9 x 11 mm image 173, also seen on coronal series  603 image 84. New 7 mm (mean diameter) cavitary nodule right lower lobe  series 2 image 141. Otherwise similar innumerable nodules including 4 mm  left upper lobe image 58, 4 mm left upper lobe image 78, 5 mm subpleural  superior left upper lobe image 69.   Airways: Diffuse bronchial wall thickening suggesting chronic bronchitis.   COPD: Mild upper  lobe predominant emphysema.   Additional lung findings: Interval development of partial collapse  anterior segment right upper lobe.   Pleura:No pleural effusion.   Heart/Coronary Arteries: Heart size within normal limits. Heavy  atherosclerotic coronary artery calcifications. Coronary stents present.   Mediastinum/Hila/Axilla: No lymphadenopathy.   Other findings: Bilateral gynecomastia. Cholecystectomy   ______________________     Impression:    1. Enlarging left lower lobe nodular opacity with new adjacent 10 mm  (mean diameter) nodule contacting the inferior pleura. 2. New 7 mm mean  diameter cavitary nodule right lower lobe. 3. Otherwise stable nodules  as above.   LUNG-RADS CATEGORY:   4 - Suspicious.   RECOMMENDATION: Thoracic surgery referral.    Electronically Signed By: Minette Brine Electronically Signed On:  08/07/2017 2:45 PM     Diagnosis:  1214 LUNGRADS 4A: SUSPICIOUS NODULE      Description:  Procedure: Following at least four-hour fasting, the patient's blood  glucose was 121 mg/dL. One hour following the injection of 12.315 mCi of  F-[id number], low dose CT images were obtained from the orbital meatal line  through the mid thigh. Then, PET images were  obtained through the same  region. Attenuation corrected images were constructed using the CT scan.  Fused images of PET and CT were reviewed. The standard uptake values (SUV)  reported below are maximum values within a region of interest, expressed  in gm/ml.   Comparison: Multiple prior CTs   Findings: Evaluation of the brain demonstrates no abnormality in the  radiotracer distribution of the cerebrum or cerebellum. Atherosclerotic  calcifications are seen in the vertebrobasilar and internal carotid  arteries. Mucosal thickening is seen in the right ethmoid air cells.   Evaluation of the neck demonstrates physiologic uptake within the salivary  glands and remaining cervical structures. No foci of hypermetabolic  activity are identified within cervical lymph nodes. Atherosclerotic  calcifications are seen within the carotid arterial tree.   Evaluation of the thorax demonstrates physiologic myocardial activity.  Atherosclerotic calcifications are seen in the thoracic aorta and branch  vessels to include severe coronary artery calcifications. Focal  collapse/consolidation is again seen in the anterior right upper lobe as  was noted on the most recent prior chest CT examination. Mild  hypermetabolic activity is seen in the branching nodular opacity in the  posteromedial left lower lobe (SUV max 2.3), nonspecific. A small  peribronchovascular nodule in the posterolateral right lower lobe which  was new on the most recent prior study demonstrates minimal nonspecific  hypermetabolic activity. Additional small nodules are present which are  below size resolution for the PET portion of this examination. There are  no hypermetabolic mediastinal or hilar lymph nodes.   Examination of the abdomen and pelvis shows physiologic uptake within the  adrenal glands, liver, spleen, kidneys, ureters and bladder. The pancreas  is unremarkable. Status post cholecystectomy. There are no hypermetabolic    lymph nodes. Atherosclerotic calcifications are seen throughout the  abdominal aorta and branch vessels. No AAA. A few colonic diverticula are  noted. There is no evidence of bowel obstruction. Diffuse hypermetabolic  activity is seen within the stomach which is greater than expected for  physiologic activity. A small lipoma is seen in the right lateral thigh  musculature.   No worrisome hypermetabolic osseous lesions are noted. Degenerative  changes are noted with mild expected hypermetabolic activity.     Impression:  1. Focal collapse/consolidation is again seen in the anterior right upper  lobe as was noted on  the recent prior chest CT examination. Small  peribronchovascular nodule in the posterolateral right lower lobe which  was new on the most recent prior study demonstrates minimal nonspecific  activity. Additional small nodules are present which are below size  resolution for the PET portion of this examination. Clinical correlation  for signs/symptoms of infection and appropriate treatment are recommended.   2. Mild nonspecific hypermetabolic activity is seen in the branching  nodular opacity in the posteromedial left lower lobe. Continued attention  on follow-up low dose CT is recommended.   3. Diffuse hypermetabolic activity within the stomach which is greater  than expected for physiologic activity. Clinical correlation and  correlation with endoscopic findings is recommended.   Electronically Signed By: Ishmael Holter Electronically Signed  On: 08/15/2017 2:37 PM      I have independently reviewed the above radiologic studies.  Recent Lab Findings: Lab Results  Component Value Date   WBC 9.9 03/05/2013   HGB 15.3 04/26/2015   HCT 45.0 04/26/2015   PLT 170 03/05/2013   GLUCOSE 103 (H) 04/26/2015   CHOL  10/28/2009    80        ATP III CLASSIFICATION:  <200     mg/dL   Desirable  200-239  mg/dL   Borderline High  >=240    mg/dL   High           TRIG 131 10/28/2009   HDL 29 (L) 10/28/2009   LDLCALC  10/28/2009    25        Total Cholesterol/HDL:CHD Risk Coronary Heart Disease Risk Table                     Men   Women  1/2 Average Risk   3.4   3.3  Average Risk       5.0   4.4  2 X Average Risk   9.6   7.1  3 X Average Risk  23.4   11.0        Use the calculated Patient Ratio above and the CHD Risk Table to determine the patient's CHD Risk.        ATP III CLASSIFICATION (LDL):  <100     mg/dL   Optimal  100-129  mg/dL   Near or Above                    Optimal  130-159  mg/dL   Borderline  160-189  mg/dL   High  >190     mg/dL   Very High   NA 139 04/26/2015   K 4.7 04/26/2015   CL 107 04/26/2015   CREATININE 2.30 (H) 04/26/2015   BUN 62 (H) 04/26/2015   CO2 26 03/05/2013   TSH 1.330 Test methodology is 3rd generation TSH 10/26/2009   INR 1.01 10/26/2009   HGBA1C (H) 10/26/2009    6.7 (NOTE) The ADA recommends the following therapeutic goal for glycemic control related to Hgb A1c measurement: Goal of therapy: <6.5 Hgb A1c  Reference: American Diabetes Association: Clinical Practice Recommendations 2010, Diabetes Care, 2010, 33: (Suppl  1).   Chronic Kidney Disease   Stage I     GFR >90  Stage II    GFR 60-89  Stage IIIA GFR 45-59  Stage IIIB GFR 30-44  Stage IV   GFR 15-29  Stage V    GFR  <15  Lab Results  Component Value Date   CREATININE 2.30 (H) 04/26/2015   CrCl cannot  be calculated (Patient's most recent lab result is older than the maximum 21 days allowed.).     Assessment / Plan:   1/Branching nodular opacity in the posteromedial left lower lobe-nonspecific finding on PET scan 2/ Focal collapse/consolidation is again seen in the anterior right upper lobe  3/Chronic renal insufficiency most recent creatinine in the system 2  years ago 4/long-term smoking history and symptoms suggestive of significant COPD-formal pulmonary function studies will be obtained 5/known significant coronary  occlusive disease with previous stents placed currently on Plavix Patient has been seen and most recent CT and PET scan were reviewed with the patient through older scans were not available  We will plan on obtaining the older scans for comparison, review his radiographic findings at the multidisciplinary thoracic oncology conference in the coming 2 weeks depending on when the films are available.  We will obtain a full set of pulmonary function studies Plan to see the patient back again in 2 weeks, and make further recommendations as far as attempts at biopsy versus continued short-term observation of multiple pulmonary abnormalities.  I  spent 40 minutes counseling the patient face to face and 50% or more the  time was spent in counseling and coordination of care. The total time spent in the appointment was 60 minutes.  Grace Isaac MD      [address] [place] [address] Office [phone]   Beeper [phone]  09/04/2017 11:48 AM

## 2017-09-10 DIAGNOSIS — E1129 Type 2 diabetes mellitus with other diabetic kidney complication: Secondary | ICD-10-CM | POA: Diagnosis not present

## 2017-09-10 DIAGNOSIS — N183 Chronic kidney disease, stage 3 (moderate): Secondary | ICD-10-CM | POA: Diagnosis not present

## 2017-09-10 DIAGNOSIS — I10 Essential (primary) hypertension: Secondary | ICD-10-CM | POA: Diagnosis not present

## 2017-09-10 DIAGNOSIS — Z6829 Body mass index (BMI) 29.0-29.9, adult: Secondary | ICD-10-CM | POA: Diagnosis not present

## 2017-09-10 DIAGNOSIS — Z794 Long term (current) use of insulin: Secondary | ICD-10-CM | POA: Diagnosis not present

## 2017-09-11 ENCOUNTER — Other Ambulatory Visit: Payer: Self-pay | Admitting: *Deleted

## 2017-09-12 ENCOUNTER — Ambulatory Visit (HOSPITAL_COMMUNITY)
Admission: RE | Admit: 2017-09-12 | Discharge: 2017-09-12 | Disposition: A | Discharge status: Home / Self Care | Payer: Medicare Other | Source: Ambulatory Visit | Attending: Cardiothoracic Surgery | Admitting: Cardiothoracic Surgery

## 2017-09-12 DIAGNOSIS — R911 Solitary pulmonary nodule: Secondary | ICD-10-CM | POA: Diagnosis not present

## 2017-09-12 LAB — PULMONARY FUNCTION TEST
DL/VA % pred: 76 %
DL/VA: 3.61 ml/min/mmHg/L
DLCO unc % pred: 51 %
DLCO unc: 18.03 ml/min/mmHg
FEF 25-75 Post: 1.37 L/sec
FEF 25-75 Pre: 0.96 L/sec
FEF2575-%Change-Post: 42 %
FEF2575-%Pred-Post: 49 %
FEF2575-%Pred-Pre: 34 %
FEV1-%Change-Post: 11 %
FEV1-%Pred-Post: 57 %
FEV1-%Pred-Pre: 51 %
FEV1-Post: 2.04 L
FEV1-Pre: 1.83 L
FEV1FVC-%Change-Post: 6 %
FEV1FVC-%Pred-Pre: 84 %
FEV6-%Change-Post: 4 %
FEV6-%Pred-Post: 66 %
FEV6-%Pred-Pre: 63 %
FEV6-Post: 3.05 L
FEV6-Pre: 2.92 L
FEV6FVC-%Change-Post: 0 %
FEV6FVC-%Pred-Post: 104 %
FEV6FVC-%Pred-Pre: 105 %
FVC-%Change-Post: 5 %
FVC-%Pred-Post: 63 %
FVC-%Pred-Pre: 60 %
FVC-Post: 3.08 L
FVC-Pre: 2.92 L
Post FEV1/FVC ratio: 66 %
Post FEV6/FVC ratio: 99 %
Pre FEV1/FVC ratio: 63 %
Pre FEV6/FVC Ratio: 100 %
RV % pred: 115 %
RV: 2.92 L
TLC % pred: 79 %
TLC: 5.9 L

## 2017-09-12 MED ORDER — ALBUTEROL SULFATE (2.5 MG/3ML) 0.083% IN NEBU
2.5000 mg | INHALATION_SOLUTION | Freq: Once | RESPIRATORY_TRACT | Status: AC
  Administered 2017-09-12: 2.5 mg via RESPIRATORY_TRACT

## 2017-09-18 ENCOUNTER — Other Ambulatory Visit: Payer: Self-pay

## 2017-09-18 ENCOUNTER — Ambulatory Visit (INDEPENDENT_AMBULATORY_CARE_PROVIDER_SITE_OTHER): Payer: Medicare Other | Admitting: Cardiothoracic Surgery

## 2017-09-18 ENCOUNTER — Encounter: Payer: Self-pay | Admitting: Cardiothoracic Surgery

## 2017-09-18 VITALS — BP 150/68 | HR 61 | Ht 72.0 in | Wt 211.0 lb

## 2017-09-18 DIAGNOSIS — R918 Other nonspecific abnormal finding of lung field: Secondary | ICD-10-CM

## 2017-09-18 NOTE — Progress Notes (Signed)
[name]       [place] [address]             [phone]                    Travis [name] [facility] Medical Record #[id number] Date of Birth: [birth year]  Referring: [name], PA-C Primary Care: [name], MD  Chief Complaint:    Chief Complaint  Patient presents with  . Follow-up    History of Present Illness:    Travis [name] 66 y.o. male is seen in the office  today for abnormal lung cancer screening CT. the patient notes that several years ago began having lung cancer screening CT is done through the [place]. recently repeat scan was performed and he was told of lesion on the scan "looked mushy".  PET scan was done through the [place] system and the patient was referred to thoracic surgery.  The patient gives a history of smoking for many years since age 33, up to a pack a day over that time.  Most recently he quit 2 years ago -05/09/2015.  Patient returns to the office today with additional scans done at the [facility] system on a disc.  Notes his past history is significant for multiple cardiac stents in 2000. ,  Last cath was approximately 8 years ago.  He has a history of diabetes mellitus and chronic renal insufficiency.  His scans that we had available and history was reviewed at the multidisciplinary thoracic oncology conference last week Current Activity/ Functional Status:  Patient is independent with mobility/ambulation, transfers, ADL's, IADL's.   Zubrod Score: At the time of surgery this patient's most appropriate activity status/level should be described as: []     0    Normal activity, no symptoms [x]     1    Restricted in physical strenuous activity but ambulatory, able to do out light work []     2    Ambulatory and capable of self care, unable to do work activities, up and about               >50 % of waking hours                              []     3    Only limited self care, in bed greater than 50% of waking hours []     4    Completely disabled,  no self care, confined to bed or chair []     5    Moribund Patient notes that he is limited in ambulation because "his legs lock up".  Past Medical History:  Diagnosis Date  . CAD (coronary artery disease)   . Coronary artery disease    PCA 1994l; PCI august 2003 of an obtust marginal. Catheterization at the time reveal: left main normal; LAD 50-75% between first and second diagonals, 50% after lesion; left circumflex 95% in obtuse marg. RCA 100% proximal.  . Diabetes mellitus    type 2  . Hyperlipemia   . Hypertension   . Kidney disease    chronic; stage 2  . Myocardial infarction ([place])   . Osteoarthritis   . Peripheral vascular disease ([place])   . Tobacco abuse     Past Surgical History:  Procedure Laterality Date  . APPENDECTOMY    . CHOLECYSTECTOMY    . COPD    . CORONARY  ANGIOPLASTY     stent times 2, last cath 3 years ago    Family History  Problem Relation Age of Onset  . Coronary artery disease Mother   . Heart disease Mother   . Cancer Father   . Stroke Other   . Heart failure Other   . Pulmonary embolism Other   . Diabetes Brother   . Hypertension Brother   . Heart attack Brother   . Peripheral vascular disease Brother   . Heart disease Brother   . Hypertension Sister       Social History   Tobacco Use  Smoking Status Former Smoker  . Packs/day: 0.50  . Years: 50.00  . Pack years: 25.00  . Types: Cigarettes  . Last attempt to quit: 05/09/2015  . Years since quitting: 2.3  Smokeless Tobacco Former Systems developer  . Types: Chew    Social History   Substance and Sexual Activity  Alcohol Use No  . Alcohol/week: 0.0 oz     Allergies  Allergen Reactions  . Penicillins Other (See Comments)    Childhood reaction    Current Outpatient Medications  Medication Sig Dispense Refill  . acetaminophen (TYLENOL) 500 MG tablet Take 1,000 mg by mouth daily as needed for pain.    [name] amLODipine (NORVASC) 10 MG tablet Take 10 mg by mouth daily.      [name] aspirin 81  MG tablet Take 81 mg by mouth daily.    . benazepril (LOTENSIN) 40 MG tablet Take 40 mg by mouth 2 (two) times daily.     . Cinnamon 500 MG capsule Take 500 mg by mouth daily.    . clopidogrel (PLAVIX) 75 MG tablet TAKE ONE TABLET BY MOUTH ONCE DAILY 90 tablet 3  . furosemide (LASIX) 80 MG tablet Take 160 mg by mouth 2 (two) times daily.     . insulin lispro (HUMALOG) 100 UNIT/ML injection Inject 9 Units into the skin 2 (two) times daily before lunch and supper.    . losartan (COZAAR) 100 MG tablet Take 100 mg by mouth 2 (two) times daily.     . metoprolol (LOPRESSOR) 100 MG tablet Take 100 mg by mouth 2 (two) times daily.      . mirtazapine (REMERON) 15 MG tablet Take 15 mg by mouth at bedtime.    . rosuvastatin (CRESTOR) 10 MG tablet Take 5 mg by mouth daily.    Nelva Nay SOLOSTAR 300 UNIT/ML SOPN Inject 19 Units into the skin daily before breakfast.    . TURMERIC PO Take 400 mg by mouth 2 (two) times daily.     No current facility-administered medications for this visit.     Pertinent items are noted in HPI.   Review of Systems:     Cardiac Review of Systems: Y or N  Chest Pain [ N   ]  Resting SOB [ Y  ] Exertional SOB  [ Y ]  Orthopnea [ N ]   Pedal Edema Aqua.Slicker   ]    Palpitations [ N ] Syncope  [ N ]   Presyncope [ N  ]  General Review of Systems: [Y] = yes [  ]=no Constitional: recent weight change Aqua.Slicker  ];  Wt loss over the last 3 months [   ] anorexia [  ]; fatigue [  ]; nausea [  ]; night sweats [  ]; fever [  ]; or chills [  ];          Dental: poor  dentition[Y  ]; Last Dentist visit:   Eye : blurred vision [  ]; diplopia [   ]; vision changes [  ];  Amaurosis fugax[  ]; Resp: cough [  ];  wheezing[  ];  hemoptysis[  ]; shortness of breath[  ]; paroxysmal nocturnal dyspnea[  ]; dyspnea on exertion[  ]; or orthopnea[  ];  GI:  gallstones[  ], vomiting[  ];  dysphagia[  ]; melena[  ];  hematochezia [  ]; heartburn[  ];   Hx of  Colonoscopy[  ]; GU: kidney stones [  ]; hematuria[  ];    dysuria [  ];  nocturia[  ];  history of     obstruction [  ]; urinary frequency [Y  ]             Skin: rash, swelling[  ];, hair loss[  ];  peripheral edema[  ];  or itching[  ]; Musculosketetal: myalgias[  ];  joint swelling[  ];  joint erythema[  ];  joint pain[  ];  back pain[  ];  Heme/Lymph: bruising[ Y ];  bleeding[ YES ON PLAVIX  ];  anemia[  ];  Neuro: TIA[ N ];  headaches[  ];  stroke[  ];  vertigo[  ];  seizures[  ];   paresthesias[  ];  difficulty walking[  ];  Psych:depression[  ]; anxiety[  ];  Endocrine: diabetes[Y  ];  thyroid dysfunction[ N ];  Immunizations: Flu up to date [ N ]; Pneumococcal up to date [ Y ];  Other:  Physical Exam: BP (!) 150/68   Pulse 61   Ht 6' (1.829 m)   Wt 211 lb (95.7 kg)   SpO2 94%   BMI 28.62 kg/m   PHYSICAL EXAMINATION: General appearance: alert, cooperative, appears older than stated age and no distress Head: Normocephalic, without obvious abnormality, atraumatic Neck: no adenopathy, no carotid bruit, no JVD, supple, symmetrical, trachea midline and thyroid not enlarged, symmetric, no tenderness/mass/nodules Lymph nodes: Cervical, supraclavicular, and axillary nodes normal. Resp: clear to auscultation bilaterally Back: symmetric, no curvature. ROM normal. No CVA tenderness. Cardio: regular rate and rhythm, S1, S2 normal, no murmur, click, rub or gallop GI: soft, non-tender; bowel sounds normal; no masses,  no organomegaly Extremities: extremities normal, atraumatic, no cyanosis or edema Neurologic: Grossly normal   Diagnostic Studies & Laboratory data:     Recent Radiology Findings: Description:  CT OF THE CHEST, LUNG CANCER SCREENING   HISTORY: lung cancer screening   COMPARISON: 08/05/2016   TECHNIQUE: CT images of the chest were obtained without intravenous  contrast utilizing low dose technique.   FINDINGS:   Lung nodules, measured on series 2: Left lower lobe nodular opacity is  similar in measured size but  increased in soft tissue bulk, measuring 17 x  21 mm image 171, and there is a new nodular component contacting the  inferior pleura measuring 9 x 11 mm image 173, also seen on coronal series  603 image 84. New 7 mm (mean diameter) cavitary nodule right lower lobe  series 2 image 141. Otherwise similar innumerable nodules including 4 mm  left upper lobe image 58, 4 mm left upper lobe image 78, 5 mm subpleural  superior left upper lobe image 69.   Airways: Diffuse bronchial wall thickening suggesting chronic bronchitis.   COPD: Mild upper lobe predominant emphysema.   Additional lung findings: Interval development of partial collapse  anterior segment right upper lobe.   Pleura:No pleural effusion.  Heart/Coronary Arteries: Heart size within normal limits. Heavy  atherosclerotic coronary artery calcifications. Coronary stents present.   Mediastinum/Hila/Axilla: No lymphadenopathy.   Other findings: Bilateral gynecomastia. Cholecystectomy   ______________________     Impression:    1. Enlarging left lower lobe nodular opacity with new adjacent 10 mm  (mean diameter) nodule contacting the inferior pleura. 2. New 7 mm mean  diameter cavitary nodule right lower lobe. 3. Otherwise stable nodules  as above.   LUNG-RADS CATEGORY:   4 - Suspicious.   RECOMMENDATION: Thoracic surgery referral.    Electronically Signed By: Minette Brine Electronically Signed On:  08/07/2017 2:45 PM     Diagnosis:  1214 LUNGRADS 4A: SUSPICIOUS NODULE      Description:  Procedure: Following at least four-hour fasting, the patient's blood  glucose was 121 mg/dL. One hour following the injection of 12.315 mCi of  F-[id number], low dose CT images were obtained from the orbital meatal line  through the mid thigh. Then, PET images were obtained through the same  region. Attenuation corrected images were constructed using the CT scan.  Fused images of PET and CT were reviewed. The standard  uptake values (SUV)  reported below are maximum values within a region of interest, expressed  in gm/ml.   Comparison: Multiple prior CTs   Findings: Evaluation of the brain demonstrates no abnormality in the  radiotracer distribution of the cerebrum or cerebellum. Atherosclerotic  calcifications are seen in the vertebrobasilar and internal carotid  arteries. Mucosal thickening is seen in the right ethmoid air cells.   Evaluation of the neck demonstrates physiologic uptake within the salivary  glands and remaining cervical structures. No foci of hypermetabolic  activity are identified within cervical lymph nodes. Atherosclerotic  calcifications are seen within the carotid arterial tree.   Evaluation of the thorax demonstrates physiologic myocardial activity.  Atherosclerotic calcifications are seen in the thoracic aorta and branch  vessels to include severe coronary artery calcifications. Focal  collapse/consolidation is again seen in the anterior right upper lobe as  was noted on the most recent prior chest CT examination. Mild  hypermetabolic activity is seen in the branching nodular opacity in the  posteromedial left lower lobe (SUV max 2.3), nonspecific. A small  peribronchovascular nodule in the posterolateral right lower lobe which  was new on the most recent prior study demonstrates minimal nonspecific  hypermetabolic activity. Additional small nodules are present which are  below size resolution for the PET portion of this examination. There are  no hypermetabolic mediastinal or hilar lymph nodes.   Examination of the abdomen and pelvis shows physiologic uptake within the  adrenal glands, liver, spleen, kidneys, ureters and bladder. The pancreas  is unremarkable. Status post cholecystectomy. There are no hypermetabolic  lymph nodes. Atherosclerotic calcifications are seen throughout the  abdominal aorta and branch vessels. No AAA. A few colonic diverticula are  noted. There  is no evidence of bowel obstruction. Diffuse hypermetabolic  activity is seen within the stomach which is greater than expected for  physiologic activity. A small lipoma is seen in the right lateral thigh  musculature.   No worrisome hypermetabolic osseous lesions are noted. Degenerative  changes are noted with mild expected hypermetabolic activity.     Impression:  1. Focal collapse/consolidation is again seen in the anterior right upper  lobe as was noted on the recent prior chest CT examination. Small  peribronchovascular nodule in the posterolateral right lower lobe which  was new on the most recent prior study demonstrates  minimal nonspecific  activity. Additional small nodules are present which are below size  resolution for the PET portion of this examination. Clinical correlation  for signs/symptoms of infection and appropriate treatment are recommended.   2. Mild nonspecific hypermetabolic activity is seen in the branching  nodular opacity in the posteromedial left lower lobe. Continued attention  on follow-up low dose CT is recommended.   3. Diffuse hypermetabolic activity within the stomach which is greater  than expected for physiologic activity. Clinical correlation and  correlation with endoscopic findings is recommended.   Electronically Signed By: Ishmael Holter Electronically Signed  On: 08/15/2017 2:37 PM      I have independently reviewed the above radiologic studies.  Recent Lab Findings: Lab Results  Component Value Date   WBC 9.9 03/05/2013   HGB 15.3 04/26/2015   HCT 45.0 04/26/2015   PLT 170 03/05/2013   GLUCOSE 103 (H) 04/26/2015   CHOL  10/28/2009    80        ATP III CLASSIFICATION:  <200     mg/dL   Desirable  200-239  mg/dL   Borderline High  >=240    mg/dL   High          TRIG 131 10/28/2009   HDL 29 (L) 10/28/2009   LDLCALC  10/28/2009    25        Total Cholesterol/HDL:CHD Risk Coronary Heart Disease Risk Table                      Men   Women  1/2 Average Risk   3.4   3.3  Average Risk       5.0   4.4  2 X Average Risk   9.6   7.1  3 X Average Risk  23.4   11.0        Use the calculated Patient Ratio above and the CHD Risk Table to determine the patient's CHD Risk.        ATP III CLASSIFICATION (LDL):  <100     mg/dL   Optimal  100-129  mg/dL   Near or Above                    Optimal  130-159  mg/dL   Borderline  160-189  mg/dL   High  >190     mg/dL   Very High   NA 139 04/26/2015   K 4.7 04/26/2015   CL 107 04/26/2015   CREATININE 2.30 (H) 04/26/2015   BUN 62 (H) 04/26/2015   CO2 26 03/05/2013   TSH 1.330 Test methodology is 3rd generation TSH 10/26/2009   INR 1.01 10/26/2009   HGBA1C (H) 10/26/2009    6.7 (NOTE) The ADA recommends the following therapeutic goal for glycemic control related to Hgb A1c measurement: Goal of therapy: <6.5 Hgb A1c  Reference: American Diabetes Association: Clinical Practice Recommendations 2010, Diabetes Care, 2010, 33: (Suppl  1).   Chronic Kidney Disease   Stage I     GFR >90  Stage II    GFR 60-89  Stage IIIA GFR 45-59  Stage IIIB GFR 30-44  Stage IV   GFR 15-29  Stage V    GFR  <15  Lab Results  Component Value Date   CREATININE 2.30 (H) 04/26/2015   CrCl cannot be calculated (Patient's most recent lab result is older than the maximum 21 days allowed.).  FEV1 1.83   51% 11% improvement with bronchdilators  DLCO  18.5  51% Pulmonary Function Diagnosis: Severe Obstructive Airways Disease Insignificant response to bronchodilator Moderate Restriction -Parenchymal Moderately severe Diffusion Defect  Assessment / Plan:   1/Branching nodular opacity in the posteromedial left lower lobe-nonspecific finding on PET scan 2/ Focal collapse/consolidation is again seen in the anterior right upper lobe  3/Chronic renal insufficiency most recent creatinine in the system 2  years ago 4/long-term smoking history and symptoms suggestive of significant  COPD-formal pulmonary function studies will be obtained 5/known significant coronary occlusive disease with previous stents placed currently on Plavix Patient has been seen and most recent CT and PET scan were reviewed with the patient through older scans were not available  The patient's scans were reviewed at the multidisciplinary thoracic oncology conference with pulmonary radiology and medical oncology.  The consensus was to obtain a follow-up CT scan in 3 months, super D format to evaluate for possible changes and if appropriate proceed with navigation bronchoscopy and biopsy.   With the significant hypermetabolic activity in the stomach it was recommended to the patient that he obtain upper GI endoscopy, he notes that he is arrange this through the [place] and plans to do it in several weeks.  I plan to see him back in 3 months with a super D CT of the chest     Grace Isaac MD      [address] [place] [address] Office [phone]   Beeper [phone]  09/18/2017 11:21 AM

## 2017-10-22 ENCOUNTER — Telehealth: Payer: Self-pay | Admitting: Cardiovascular Disease

## 2017-10-22 ENCOUNTER — Other Ambulatory Visit: Payer: Self-pay | Admitting: *Deleted

## 2017-10-22 MED ORDER — CLOPIDOGREL BISULFATE 75 MG PO TABS: 75.0000 mg | ORAL_TABLET | Freq: Every day | ORAL | 0 refills | Status: DC

## 2017-10-22 NOTE — Telephone Encounter (Signed)
°*  STAT* If patient is at the pharmacy, call can be transferred to refill team.   1. Which medications need to be refilled? (please list name of each medication and dose if known)Clopidogrel  75 mg ( Need a New Prescription Sent- Patient changing Pharmacy)   2. Which pharmacy/location (including street and city if local pharmacy) is medication to be sent to?[facility] on [address]    3. Do they need a 30 day or 90 day supply? [place]

## 2017-10-24 ENCOUNTER — Ambulatory Visit (INDEPENDENT_AMBULATORY_CARE_PROVIDER_SITE_OTHER): Payer: Medicare Other | Admitting: Cardiovascular Disease

## 2017-10-24 ENCOUNTER — Encounter: Payer: Self-pay | Admitting: Cardiovascular Disease

## 2017-10-24 VITALS — BP 158/70 | HR 48 | Ht 72.0 in | Wt 213.8 lb

## 2017-10-24 DIAGNOSIS — I2510 Atherosclerotic heart disease of native coronary artery without angina pectoris: Secondary | ICD-10-CM | POA: Diagnosis not present

## 2017-10-24 DIAGNOSIS — I255 Ischemic cardiomyopathy: Secondary | ICD-10-CM

## 2017-10-24 DIAGNOSIS — F17201 Nicotine dependence, unspecified, in remission: Secondary | ICD-10-CM | POA: Diagnosis not present

## 2017-10-24 DIAGNOSIS — I10 Essential (primary) hypertension: Secondary | ICD-10-CM | POA: Diagnosis not present

## 2017-10-24 DIAGNOSIS — I739 Peripheral vascular disease, unspecified: Secondary | ICD-10-CM | POA: Diagnosis not present

## 2017-10-24 MED ORDER — CLOPIDOGREL BISULFATE 75 MG PO TABS: 75.0000 mg | ORAL_TABLET | Freq: Every day | ORAL | 3 refills | Status: DC

## 2017-10-24 NOTE — Patient Instructions (Signed)
Medication Instructions:  Your physician recommends that you continue on your current medications as directed. Please refer to the Current Medication list given to you today.   Labwork: none  Testing/Procedures: none  Follow-Up: Your physician wants you to follow-up in: one year.  You will receive a reminder letter in the mail two months in advance. If you don't receive a letter, please call our office to schedule the follow-up appointment.   Any Other Special Instructions Will Be Listed Below (If Applicable).     If you need a refill on your cardiac medications before your next appointment, please call your pharmacy.

## 2017-10-24 NOTE — Progress Notes (Signed)
Chief Complaint  Patient presents with  . Coronary Artery Disease   History of Present Illness: 66 yo male with history of CAD, DM, HTN, HLD, PAD, tobacco abuse here today for cardiac follow up. He has been followed in the past by Dr. Lia Foyer. He had an angioplasty of the LAD in the early 1990s with directional atherectomy of the LAD in 1994. Cypher DES placed in the OM in 2003. Cypher DES placed in the LAD in 2004. Last cath December 2010 with patent LAD and OM stents, totally occluded RCA. The LAD had a mid 60% stenosis. The diagonal had a 50% stenosis. Dr. Lia Foyer managed him with medical therapy. His PAD is followed in [facility] by Dr. Donnetta Hutching. Carotid dopplers May 2016 with less than 49% bilateral ICA stenosis. ABI January 2017 0.79 right, 0.53 left. Echo 11/08/13 with LVEF=55%, severe LVH, moderate LAE. I arranged a stress myoview in December 2015 and he cancelled. He has been seen recently by Dr. Servando Snare for evaluation of a lung mass.   He is here today for follow up. The patient denies any chest pain, dyspnea, palpitations, lower extremity edema, orthopnea, PND, dizziness, near syncope or syncope.   Primary Care Physician: [name], MD   Past Medical History:  Diagnosis Date  . CAD (coronary artery disease)   . Coronary artery disease    PCA 1994l; PCI august 2003 of an obtust marginal. Catheterization at the time reveal: left main normal; LAD 50-75% between first and second diagonals, 50% after lesion; left circumflex 95% in obtuse marg. RCA 100% proximal.  . Diabetes mellitus    type 2  . Hyperlipemia   . Hypertension   . Kidney disease    chronic; stage 2  . Myocardial infarction ([place])   . Osteoarthritis   . Peripheral vascular disease ([place])   . Tobacco abuse     Past Surgical History:  Procedure Laterality Date  . APPENDECTOMY    . CHOLECYSTECTOMY    . COPD    . CORONARY ANGIOPLASTY     stent times 2, last cath 3 years ago  . NERVE EXPLORATION  07/29/2012   Procedure:  NERVE EXPLORATION;  Surgeon: Schuyler Amor, MD;  Location: [place];  Service: Orthopedics;  Laterality: Left;  Exploration /repair nerve and tendon left thumb  . PERIPHERAL VASCULAR CATHETERIZATION N/A 04/26/2015   Procedure: Abdominal Aortogram;  Surgeon: Rosetta Posner, MD;  Location: [place] [place] [facility];  Service: Cardiovascular;  Laterality: N/A;    Current Outpatient Medications  Medication Sig Dispense Refill  . acetaminophen (TYLENOL) 500 MG tablet Take 1,000 mg by mouth daily as needed for pain.    [name] amLODipine (NORVASC) 10 MG tablet Take 10 mg by mouth daily.      [name] aspirin 81 MG tablet Take 81 mg by mouth daily.    . benazepril (LOTENSIN) 40 MG tablet Take 40 mg by mouth 2 (two) times daily.     . Cinnamon 500 MG capsule Take 500 mg by mouth daily.    . clopidogrel (PLAVIX) 75 MG tablet Take 1 tablet (75 mg total) by mouth daily. 90 tablet 0  . furosemide (LASIX) 80 MG tablet Take 160 mg by mouth 2 (two) times daily.     . insulin lispro (HUMALOG) 100 UNIT/ML injection Inject 9 Units into the skin 2 (two) times daily before lunch and supper.    . losartan (COZAAR) 100 MG tablet Take 100 mg by mouth 2 (two) times daily.     [name]  metoprolol (LOPRESSOR) 100 MG tablet Take 100 mg by mouth 2 (two) times daily.      . mirtazapine (REMERON) 15 MG tablet Take 15 mg by mouth at bedtime.    . rosuvastatin (CRESTOR) 10 MG tablet Take 5 mg by mouth daily.    Nelva Nay SOLOSTAR 300 UNIT/ML SOPN Inject 19 Units into the skin daily before breakfast.    . TURMERIC PO Take 400 mg by mouth 2 (two) times daily.     No current facility-administered medications for this visit.     Allergies  Allergen Reactions  . Penicillins Other (See Comments)    Childhood reaction    Social History   Socioeconomic History  . Marital status: Married    Spouse name: Not on file  . Number of children: 3  . Years of education: Not on file  . Highest education level: Not on file  Social Needs   . Financial resource strain: Not on file  . Food insecurity - worry: Not on file  . Food insecurity - inability: Not on file  . Transportation needs - medical: Not on file  . Transportation needs - non-medical: Not on file  Occupational History  . Not on file  Tobacco Use  . Smoking status: Former Smoker    Packs/day: 0.50    Years: 50.00    Pack years: 25.00    Types: Cigarettes    Last attempt to quit: 05/09/2015    Years since quitting: 2.4  . Smokeless tobacco: Former Systems developer    Types: Chew  Substance and Sexual Activity  . Alcohol use: No    Alcohol/week: 0.0 oz  . Drug use: No  . Sexual activity: Not on file  Other Topics Concern  . Not on file  Social History Narrative   Has a 40-pack-year smoking history and currently smokes 1/2 pack per day.    Served [employer] [phone], arm[employer] personal [employer] [place].    Family History  Problem Relation Age of Onset  . Coronary artery disease Mother   . Heart disease Mother   . Cancer Father   . Stroke Other   . Heart failure Other   . Pulmonary embolism Other   . Diabetes Brother   . Hypertension Brother   . Heart attack Brother   . Peripheral vascular disease Brother   . Heart disease Brother   . Hypertension Sister     Review of Systems:  As stated in the HPI and otherwise negative.   BP (!) 158/70   Pulse (!) 48   Ht 6' (1.829 m)   Wt 213 lb 12.8 oz (97 kg)   SpO2 95%   BMI 29.00 kg/m   Physical Examination:  General: Well developed, well nourished, NAD  HEENT: OP clear, mucus membranes moist  SKIN: warm, dry. No rashes. Neuro: No focal deficits  Musculoskeletal: Muscle strength 5/5 all ext  Psychiatric: Mood and affect normal  Neck: No JVD, no carotid bruits, no thyromegaly, no lymphadenopathy.  Lungs:Clear bilaterally, no wheezes, rhonci, crackles Cardiovascular: Regular rate and rhythm. No murmurs, gallops or rubs. Abdomen:Soft. Bowel sounds present. Non-tender.  Extremities: No lower extremity edema.  Pulses are 2 + in the bilateral DP/PT.  Echo 11/08/13: Left ventricle: Inferobasal hypokinesis The cavity size was normal. Wall thickness was increased in a pattern of severe LVH. The estimated ejection fraction was 55%. - Aortic valve: There was mild stenosis. - Left atrium: The atrium was moderately dilated. - Atrial septum: No defect or patent foramen  ovale was Identified.  EKG:  EKG is ordered today. The ekg ordered today demonstrates sinus brady, rate 48 bpm. IVCD.  Recent Labs: No results found for requested labs within last 8760 hours.   Lipid Panel   Wt Readings from Last 3 Encounters:  10/24/17 213 lb 12.8 oz (97 kg)  09/18/17 211 lb (95.7 kg)  09/04/17 210 lb (95.3 kg)     Other studies Reviewed: Additional studies/ records that were reviewed today include: . Review of the above records demonstrates:    Assessment and Plan:   1. CAD without angina: He is known to have moderate CAD with prior coronary stent placements. He is having no chest pain suggestive of angina. Will continue Plavix, statin and beta blocker. ASA stopped due to easy bruising. Last attempt at a stress test was cancelled by the patient.     2. PAD: Followed in [facility] by Dr. Donnetta Hutching. LE angiogram June 2015 with left SFA occlusion. Stable ABI January 2017. Plan for medical management.   3. Ischemic cardiomyopathy: Last LVEF=55% December 2014. Continue beta blocker and ARB  4. HTN: BP controlled. Followed in primary care and in Nephrology clinic.    5. Carotid artery disease: Followed by DR. Early.   6. Tobacco abuse, in remission: He has stopped smoking.   Current medicines are reviewed at length with the patient today.  The patient does not have concerns regarding medicines.  The following changes have been made:  no change  Labs/ tests ordered today include:   No orders of the defined types were placed in this encounter.    Disposition:   FU with me in 12  months   Signed, Lauree Chandler, MD 10/24/2017 3:23 PM    [facility] [address], [place], [place]  [address] Phone: [phone]; Fax: [phone]

## 2017-11-11 DIAGNOSIS — C3490 Malignant neoplasm of unspecified part of unspecified bronchus or lung: Secondary | ICD-10-CM

## 2017-11-11 HISTORY — DX: Malignant neoplasm of unspecified part of unspecified bronchus or lung: C34.90

## 2017-11-21 ENCOUNTER — Ambulatory Visit (INDEPENDENT_AMBULATORY_CARE_PROVIDER_SITE_OTHER): Payer: Medicare Other

## 2017-11-21 ENCOUNTER — Encounter: Payer: Self-pay | Admitting: Podiatry

## 2017-11-21 ENCOUNTER — Ambulatory Visit (INDEPENDENT_AMBULATORY_CARE_PROVIDER_SITE_OTHER): Payer: Medicare Other | Admitting: Podiatry

## 2017-11-21 DIAGNOSIS — M659 Synovitis and tenosynovitis, unspecified: Secondary | ICD-10-CM

## 2017-11-21 DIAGNOSIS — M205X1 Other deformities of toe(s) (acquired), right foot: Secondary | ICD-10-CM

## 2017-11-21 DIAGNOSIS — M779 Enthesopathy, unspecified: Secondary | ICD-10-CM

## 2017-11-21 MED ORDER — DICLOFENAC SODIUM 1 % TD GEL: 2.0000 g | Freq: Four times a day (QID) | TRANSDERMAL | 2 refills | Status: DC

## 2017-11-24 DIAGNOSIS — E1129 Type 2 diabetes mellitus with other diabetic kidney complication: Secondary | ICD-10-CM | POA: Diagnosis not present

## 2017-11-24 DIAGNOSIS — E7849 Other hyperlipidemia: Secondary | ICD-10-CM | POA: Diagnosis not present

## 2017-11-24 DIAGNOSIS — Z125 Encounter for screening for malignant neoplasm of prostate: Secondary | ICD-10-CM | POA: Diagnosis not present

## 2017-11-24 DIAGNOSIS — E538 Deficiency of other specified B group vitamins: Secondary | ICD-10-CM | POA: Diagnosis not present

## 2017-11-24 DIAGNOSIS — N183 Chronic kidney disease, stage 3 (moderate): Secondary | ICD-10-CM | POA: Diagnosis not present

## 2017-11-24 DIAGNOSIS — R82998 Other abnormal findings in urine: Secondary | ICD-10-CM | POA: Diagnosis not present

## 2017-11-24 NOTE — Progress Notes (Signed)
Subjective:   Patient ID: Travis [name], male   DOB: 67 y.o.   MRN: [id number]   HPI Travis [name] presents the office today with his wife for concerns of pain to the right big toe joint.  He states that the toe "does not wiggle" when he tries to bend the toe he gets quite a bit of pain.  He states that the pain worsens after prolonged weightbearing or activity.  No recent injury or trauma.  He has not had any significant swelling or redness.  He states the symptoms started last week after he was at [facility] and doing more walking than normal.  No other concerns today.  He has an upcoming trip to [facility] that he would like to get this taken care of so he can do more walking.  Review of Systems  All other systems reviewed and are negative.  Past Medical History:  Diagnosis Date  . CAD (coronary artery disease)   . Coronary artery disease    PCA 1994l; PCI august 2003 of an obtust marginal. Catheterization at the time reveal: left main normal; LAD 50-75% between first and second diagonals, 50% after lesion; left circumflex 95% in obtuse marg. RCA 100% proximal.  . Diabetes mellitus    type 2  . Hyperlipemia   . Hypertension   . Kidney disease    chronic; stage 2  . Myocardial infarction ([place])   . Osteoarthritis   . Peripheral vascular disease ([place])   . Tobacco abuse     Past Surgical History:  Procedure Laterality Date  . APPENDECTOMY    . CHOLECYSTECTOMY    . COPD    . CORONARY ANGIOPLASTY     stent times 2, last cath 3 years ago  . NERVE EXPLORATION  07/29/2012   Procedure: NERVE EXPLORATION;  Surgeon: Schuyler Amor, MD;  Location: [facility];  Service: Orthopedics;  Laterality: Left;  Exploration /repair nerve and tendon left thumb  . PERIPHERAL VASCULAR CATHETERIZATION N/A 04/26/2015   Procedure: Abdominal Aortogram;  Surgeon: Rosetta Posner, MD;  Location: [place] [facility];  Service: Cardiovascular;  Laterality: N/A;     Current Outpatient Medications:  .   acetaminophen (TYLENOL) 500 MG tablet, Take 1,000 mg by mouth daily as needed for pain., Disp: , Rfl:  .  amLODipine (NORVASC) 10 MG tablet, Take 10 mg by mouth daily.  , Disp: , Rfl:  .  aspirin 81 MG tablet, Take 81 mg by mouth daily., Disp: , Rfl:  .  benazepril (LOTENSIN) 40 MG tablet, Take 40 mg by mouth 2 (two) times daily. , Disp: , Rfl:  .  Cinnamon 500 MG capsule, Take 500 mg by mouth daily., Disp: , Rfl:  .  clopidogrel (PLAVIX) 75 MG tablet, Take 1 tablet (75 mg total) by mouth daily., Disp: 90 tablet, Rfl: 3 .  diclofenac sodium (VOLTAREN) 1 % GEL, Apply 2 g topically 4 (four) times daily. Rub into affected area of foot 2 to 4 times daily, Disp: 100 g, Rfl: 2 .  furosemide (LASIX) 80 MG tablet, Take 160 mg by mouth 2 (two) times daily. , Disp: , Rfl:  .  insulin lispro (HUMALOG) 100 UNIT/ML injection, Inject 9 Units into the skin 2 (two) times daily before lunch and supper., Disp: , Rfl:  .  losartan (COZAAR) 100 MG tablet, Take 100 mg by mouth 2 (two) times daily. , Disp: , Rfl:  .  metoprolol (LOPRESSOR) 100 MG tablet, Take 100 mg  by mouth 2 (two) times daily.  , Disp: , Rfl:  .  mirtazapine (REMERON) 15 MG tablet, Take 15 mg by mouth at bedtime., Disp: , Rfl:  .  rosuvastatin (CRESTOR) 10 MG tablet, Take 5 mg by mouth daily., Disp: , Rfl:  .  TOUJEO SOLOSTAR 300 UNIT/ML SOPN, Inject 19 Units into the skin daily before breakfast., Disp: , Rfl:  .  TURMERIC PO, Take 400 mg by mouth 2 (two) times daily., Disp: , Rfl:   Allergies  Allergen Reactions  . Penicillins Other (See Comments)    Childhood reaction    Social History   Socioeconomic History  . Marital status: Married    Spouse name: Not on file  . Number of children: 3  . Years of education: Not on file  . Highest education level: Not on file  Social Needs  . Financial resource strain: Not on file  . Food insecurity - worry: Not on file  . Food insecurity - inability: Not on file  . Transportation needs - medical:  Not on file  . Transportation needs - non-medical: Not on file  Occupational History  . Not on file  Tobacco Use  . Smoking status: Former Smoker    Packs/day: 0.50    Years: 50.00    Pack years: 25.00    Types: Cigarettes    Last attempt to quit: 05/09/2015    Years since quitting: 2.5  . Smokeless tobacco: Former Systems developer    Types: Chew  Substance and Sexual Activity  . Alcohol use: No    Alcohol/week: 0.0 oz  . Drug use: No  . Sexual activity: Not on file  Other Topics Concern  . Not on file  Social History Narrative   Has a 40-pack-year smoking history and currently smokes 1/2 pack per day.    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         Objective:  Physical Exam   General: AAO x3, NAD  Dermatological: Skin is warm, dry and supple bilateral. Nails x 10 are well manicured; remaining integument appears unremarkable at this time. There are no open sores, no preulcerative lesions, no rash or signs of infection present.  Vascular: Dorsalis Pedis artery and Posterior Tibial artery pedal pulses are 2/4 bilateral with immedate capillary fill time. Pedal hair growth present. There is no pain with calf compression, swelling, warmth, erythema.   Neruologic: Grossly intact via light touch bilateral.  Protective threshold with Semmes Wienstein monofilament intact to all pedal sites bilateral.  Musculoskeletal: There is decreased range of motion of the right first MTPJ and there is mild crepitation present.  There is plantarflexion of the first metatarsal head and there is prominence of the sesamoid complex plantarly.  There is no significant edema, erythema, increase in warmth.  There is dorsal bone spurring present of the first MPJ.  No erythema or increase in warmth.  No other area of tenderness identified.  Muscular strength 5/5 in all groups tested bilateral.  Gait: Unassisted, Nonantalgic.      Assessment:   Capsulitis, hallux limitus right first MPJ      Plan:  -Treatment options discussed including all alternatives, risks, and complications -Etiology of symptoms were discussed -X-rays were obtained and reviewed.  Arthritic changes present first MPJ on the right side.  No evidence of acute fracture. -We discussed both conservative as well as surgical treatment options.  We will start with conservative treatment.  Declined steroid injection today.  I ordered a Voltaren gel  to apply topically.  I also try to modify the inserts that he had and set of issues to help take pressure off the first MPJ.  Discussed with him a more custom orthotic if symptoms continue to heal up to hold off on that today as well and start with this treatment first.  We also discussed the change in shoes to include a stiffer sole of the shoe. -Follow-up in 6 weeks or sooner if needed.  Trula Slade DPM

## 2017-11-27 ENCOUNTER — Other Ambulatory Visit: Payer: Self-pay | Admitting: Cardiothoracic Surgery

## 2017-11-27 DIAGNOSIS — R911 Solitary pulmonary nodule: Secondary | ICD-10-CM

## 2017-12-01 DIAGNOSIS — Z0000 Encounter for general adult medical examination without abnormal findings: Secondary | ICD-10-CM | POA: Diagnosis not present

## 2017-12-01 DIAGNOSIS — Z6829 Body mass index (BMI) 29.0-29.9, adult: Secondary | ICD-10-CM | POA: Diagnosis not present

## 2017-12-01 DIAGNOSIS — Z1389 Encounter for screening for other disorder: Secondary | ICD-10-CM | POA: Diagnosis not present

## 2017-12-01 DIAGNOSIS — N183 Chronic kidney disease, stage 3 (moderate): Secondary | ICD-10-CM | POA: Diagnosis not present

## 2017-12-01 DIAGNOSIS — E1151 Type 2 diabetes mellitus with diabetic peripheral angiopathy without gangrene: Secondary | ICD-10-CM | POA: Diagnosis not present

## 2017-12-01 DIAGNOSIS — Z794 Long term (current) use of insulin: Secondary | ICD-10-CM | POA: Diagnosis not present

## 2017-12-01 DIAGNOSIS — E1129 Type 2 diabetes mellitus with other diabetic kidney complication: Secondary | ICD-10-CM | POA: Diagnosis not present

## 2017-12-01 DIAGNOSIS — J449 Chronic obstructive pulmonary disease, unspecified: Secondary | ICD-10-CM | POA: Diagnosis not present

## 2017-12-01 DIAGNOSIS — R21 Rash and other nonspecific skin eruption: Secondary | ICD-10-CM | POA: Diagnosis not present

## 2017-12-01 DIAGNOSIS — I7389 Other specified peripheral vascular diseases: Secondary | ICD-10-CM | POA: Diagnosis not present

## 2017-12-01 DIAGNOSIS — R808 Other proteinuria: Secondary | ICD-10-CM | POA: Diagnosis not present

## 2017-12-01 DIAGNOSIS — I2510 Atherosclerotic heart disease of native coronary artery without angina pectoris: Secondary | ICD-10-CM | POA: Diagnosis not present

## 2017-12-16 DIAGNOSIS — Z1212 Encounter for screening for malignant neoplasm of rectum: Secondary | ICD-10-CM | POA: Diagnosis not present

## 2017-12-23 ENCOUNTER — Ambulatory Visit: Payer: Medicare Other | Admitting: Cardiothoracic Surgery

## 2017-12-23 ENCOUNTER — Other Ambulatory Visit: Payer: Medicare Other

## 2017-12-25 DIAGNOSIS — D631 Anemia in chronic kidney disease: Secondary | ICD-10-CM | POA: Diagnosis not present

## 2017-12-25 DIAGNOSIS — I739 Peripheral vascular disease, unspecified: Secondary | ICD-10-CM | POA: Diagnosis not present

## 2017-12-25 DIAGNOSIS — N183 Chronic kidney disease, stage 3 (moderate): Secondary | ICD-10-CM | POA: Diagnosis not present

## 2017-12-25 DIAGNOSIS — E669 Obesity, unspecified: Secondary | ICD-10-CM | POA: Diagnosis not present

## 2017-12-25 DIAGNOSIS — I2510 Atherosclerotic heart disease of native coronary artery without angina pectoris: Secondary | ICD-10-CM | POA: Diagnosis not present

## 2017-12-25 DIAGNOSIS — M109 Gout, unspecified: Secondary | ICD-10-CM | POA: Diagnosis not present

## 2017-12-25 DIAGNOSIS — E1122 Type 2 diabetes mellitus with diabetic chronic kidney disease: Secondary | ICD-10-CM | POA: Diagnosis not present

## 2017-12-25 DIAGNOSIS — I129 Hypertensive chronic kidney disease with stage 1 through stage 4 chronic kidney disease, or unspecified chronic kidney disease: Secondary | ICD-10-CM | POA: Diagnosis not present

## 2017-12-25 DIAGNOSIS — E782 Mixed hyperlipidemia: Secondary | ICD-10-CM | POA: Diagnosis not present

## 2017-12-25 DIAGNOSIS — N2581 Secondary hyperparathyroidism of renal origin: Secondary | ICD-10-CM | POA: Diagnosis not present

## 2018-01-02 ENCOUNTER — Encounter: Payer: Self-pay | Admitting: Podiatry

## 2018-01-02 ENCOUNTER — Ambulatory Visit (INDEPENDENT_AMBULATORY_CARE_PROVIDER_SITE_OTHER): Payer: Medicare Other | Admitting: Podiatry

## 2018-01-02 DIAGNOSIS — M779 Enthesopathy, unspecified: Secondary | ICD-10-CM

## 2018-01-05 NOTE — Progress Notes (Signed)
Subjective: Travis [name] of pain to the right big toe joint plantarly presents the office today for follow-up evaluation.  He states that he is doing much better and the cream has been helping tremendously.  He is been using Voltaren gel.  Also the inserts are modified for him is been doing better take pressure off the area he feels that there is been some decrease in swelling to the joint. Denies any systemic complaints such as fevers, chills, nausea, vomiting. No acute changes since last appointment, and no other complaints at this time.   Objective: AAO x3, NAD DP/PT pulses palpable bilaterally, CRT less than 3 seconds There is minimal edema to the sesamoids plantarly. There is no significant discomfort of the first MTPJ or along the sesamoid complex today.  There is no area pinpoint bony tenderness or pain to vibratory sensation today. No open lesions or pre-ulcerative lesions.  No pain with calf compression, swelling, warmth, erythema  Assessment: Capsulitis first MPJ, sesamoiditis  Plan: -All treatment options discussed with the patient including all alternatives, risks, complications.  -Overall symptoms are improving.  He declines a steroid injection today.  I did modify the insert and set of issues take pressure off of the sesamoids.  Continue Voltaren gel. -Patient encouraged to call the office with any questions, concerns, change in symptoms.   Trula Slade DPM

## 2018-01-08 ENCOUNTER — Ambulatory Visit (INDEPENDENT_AMBULATORY_CARE_PROVIDER_SITE_OTHER): Payer: Medicare Other | Admitting: Cardiothoracic Surgery

## 2018-01-08 ENCOUNTER — Encounter: Payer: Self-pay | Admitting: Cardiothoracic Surgery

## 2018-01-08 ENCOUNTER — Ambulatory Visit
Admission: RE | Admit: 2018-01-08 | Discharge: 2018-01-08 | Disposition: A | Discharge status: Home / Self Care | Payer: Medicare Other | Source: Ambulatory Visit | Attending: Cardiothoracic Surgery | Admitting: Cardiothoracic Surgery

## 2018-01-08 VITALS — BP 158/73 | HR 49 | Resp 20 | Ht 72.0 in | Wt 214.0 lb

## 2018-01-08 DIAGNOSIS — R911 Solitary pulmonary nodule: Secondary | ICD-10-CM

## 2018-01-08 DIAGNOSIS — R918 Other nonspecific abnormal finding of lung field: Secondary | ICD-10-CM | POA: Diagnosis not present

## 2018-01-08 NOTE — Progress Notes (Signed)
[address]       [place] [address]             [phone]                    [facility] M [facility] Record #[id number] Date of Birth: 1951-09-10  Referring: [name], PA-C Primary Care: [name], MD  Chief Complaint:    Chief Complaint  Patient presents with  . Lung Lesion    3 month f/u with super D Chest CT    History of Present Illness:    Travis [name] 67 y.o. male is seen in the office  today for abnormal lung cancer screening CT. The  patient notes that several years ago began having lung cancer screening CT is done through the [place]. recently repeat scan was performed and he was told of lesion on the scan "looked mushy".  PET scan was done through the [place] system and the patient was referred to thoracic surgery.  The patient gives a history of smoking for many years since age 29, up to a pack a day over that time.  Most recently he quit 2 years ago -05/09/2015.    Notes his past history is significant for multiple cardiac stents in 2000. ,  Last cath was approximately 8 years ago.  He has a history of diabetes mellitus and chronic renal insufficiency.      Current Activity/ Functional Status:  Patient is independent with mobility/ambulation, transfers, ADL's, IADL's.   Zubrod Score: At the time of surgery this patient's most appropriate activity status/level should be described as: []     0    Normal activity, no symptoms [x]     1    Restricted in physical strenuous activity but ambulatory, able to do out light work []     2    Ambulatory and capable of self care, unable to do work activities, up and about               >50 % of waking hours                              []     3    Only limited self care, in bed greater than 50% of waking hours []     4    Completely disabled, no self care, confined to bed or chair []     5    Moribund Patient notes that he is limited in ambulation because "his legs lock up".  Past Medical History:    Diagnosis Date  . CAD (coronary artery disease)   . Coronary artery disease    PCA 1994l; PCI august 2003 of an obtust marginal. Catheterization at the time reveal: left main normal; LAD 50-75% between first and second diagonals, 50% after lesion; left circumflex 95% in obtuse marg. RCA 100% proximal.  . Diabetes mellitus    type 2  . Hyperlipemia   . Hypertension   . Kidney disease    chronic; stage 2  . Myocardial infarction ([facility])   . Osteoarthritis   . Peripheral vascular disease ([facility])   . Tobacco abuse     Past Surgical History:  Procedure Laterality Date  . APPENDECTOMY    . CHOLECYSTECTOMY    . COPD    . CORONARY ANGIOPLASTY     stent times 2, last cath 3 years ago  . NERVE  EXPLORATION  07/29/2012   Procedure: NERVE EXPLORATION;  Surgeon: Schuyler Amor, MD;  Location: [facility];  Service: Orthopedics;  Laterality: Left;  Exploration /repair nerve and tendon left thumb  . PERIPHERAL VASCULAR CATHETERIZATION N/A 04/26/2015   Procedure: Abdominal Aortogram;  Surgeon: Rosetta Posner, MD;  Location: [facility];  Service: Cardiovascular;  Laterality: N/A;    Family History  Problem Relation Age of Onset  . Coronary artery disease Mother   . Heart disease Mother   . Cancer Father   . Stroke Other   . Heart failure Other   . Pulmonary embolism Other   . Diabetes Brother   . Hypertension Brother   . Heart attack Brother   . Peripheral vascular disease Brother   . Heart disease Brother   . Hypertension Sister       Social History   Tobacco Use  Smoking Status Former Smoker  . Packs/day: 0.50  . Years: 50.00  . Pack years: 25.00  . Types: Cigarettes  . Last attempt to quit: 05/09/2015  . Years since quitting: 2.6  Smokeless Tobacco Former Systems developer  . Types: Chew    Social History   Substance and Sexual Activity  Alcohol Use No  . Alcohol/week: 0.0 oz     Allergies  Allergen Reactions  . Penicillins Other (See Comments)     Childhood reaction    Current Outpatient Medications  Medication Sig Dispense Refill  . acetaminophen (TYLENOL) 500 MG tablet Take 1,000 mg by mouth daily as needed for pain.    [name] amLODipine (NORVASC) 10 MG tablet Take 10 mg by mouth daily.      [name] aspirin 81 MG tablet Take 81 mg by mouth daily.    . benazepril (LOTENSIN) 40 MG tablet Take 40 mg by mouth 2 (two) times daily.     . Cinnamon 500 MG capsule Take 500 mg by mouth daily.    . clopidogrel (PLAVIX) 75 MG tablet Take 1 tablet (75 mg total) by mouth daily. 90 tablet 3  . diclofenac sodium (VOLTAREN) 1 % GEL Apply 2 g topically 4 (four) times daily. Rub into affected area of foot 2 to 4 times daily 100 g 2  . furosemide (LASIX) 80 MG tablet Take 160 mg by mouth 2 (two) times daily.     . insulin lispro (HUMALOG) 100 UNIT/ML injection Inject 11 Units into the skin 2 (two) times daily before lunch and supper.     . losartan (COZAAR) 100 MG tablet Take 100 mg by mouth 2 (two) times daily.     . metoprolol (LOPRESSOR) 100 MG tablet Take 100 mg by mouth 2 (two) times daily.      . mirtazapine (REMERON) 15 MG tablet Take 15 mg by mouth at bedtime.    . rosuvastatin (CRESTOR) 10 MG tablet Take 5 mg by mouth daily.    Nelva Nay SOLOSTAR 300 UNIT/ML SOPN Inject 28 Units into the skin daily before breakfast.     . TURMERIC PO Take 400 mg by mouth 2 (two) times daily.     No current facility-administered medications for this visit.     Pertinent items are noted in HPI.   Review of Systems:  Review of Systems  Constitutional: Negative.   HENT: Negative.   Eyes: Negative.   Respiratory: Positive for cough, sputum production, shortness of breath and wheezing. Negative for hemoptysis.   Cardiovascular: Negative for chest pain and palpitations.  Gastrointestinal: Negative.   Genitourinary:  Negative.   Musculoskeletal: Negative.   Skin: Negative.   Neurological: Negative.   Endo/Heme/Allergies: Negative.   Psychiatric/Behavioral: Negative.     Physical Exam: BP (!) 158/73   Pulse (!) 49   Resp 20   Ht 6' (1.829 m)   Wt 214 lb (97.1 kg)   SpO2 96% Comment: RA  BMI 29.02 kg/m   PHYSICAL EXAMINATION: General appearance: alert and cooperative Head: Normocephalic, without obvious abnormality, atraumatic Neck: no adenopathy, no carotid bruit, no JVD, supple, symmetrical, trachea midline and thyroid not enlarged, symmetric, no tenderness/mass/nodules Lymph nodes: Cervical, supraclavicular, and axillary nodes normal. Resp: clear to auscultation bilaterally Back: symmetric, no curvature. ROM normal. No CVA tenderness. Cardio: regular rate and rhythm, S1, S2 normal, no murmur, click, rub or gallop GI: soft, non-tender; bowel sounds normal; no masses,  no organomegaly Extremities: extremities normal, atraumatic, no cyanosis or edema and Homans sign is negative, no sign of DVT Neurologic: Grossly normal   Diagnostic Studies & Laboratory data:     Recent Radiology Findings: Ct Super D Chest Wo Contrast  Result Date: 01/08/2018 CLINICAL DATA:  Left lower lobe pulmonary nodule. Preoperative planning. EXAM: CT CHEST WITHOUT CONTRAST TECHNIQUE: Multidetector CT imaging of the chest was performed using thin slice collimation for electromagnetic bronchoscopy planning purposes, without intravenous contrast. COMPARISON:  Outside CT scans and PET-CT. The PET-CT is dated 08/15/2017 and is from [facility] FINDINGS: Cardiovascular: The heart is within normal limits in size. No pericardial effusion. Extensive atherosclerotic calcifications involving the thoracic aorta and branch vessels including dense three-vessel coronary artery calcifications. No focal aneurysm. Mediastinum/Nodes: Small scattered sub 8 mm mediastinal and hilar lymph nodes. No mass or adenopathy. The esophagus is grossly normal. Lungs/Pleura: 15 x 10 mm left lower lobe pulmonary lesion noted on image number 102. Several small sub 4 mm nodules are noted in both lungs. These appears  stable. 9.5 x 5.5 mm bilobed nodular density in the right lower lobe on image number 90 associated with a slightly dilated bronchus just proximally. This is new since the prior CT scan and could represent mucoid impaction or endobronchial lesion. Stable underlying emphysematous changes. Upper Abdomen: No significant upper abdominal findings. There is a stable left adrenal gland adenoma. No findings suspicious for hepatic metastatic disease. Advanced atherosclerotic calcifications involving the upper abdominal aorta and visualized branch vessels. Musculoskeletal: No chest wall mass, supraclavicular or axillary lymphadenopathy. The thyroid gland is grossly normal. Mild bilateral symmetric gynecomastia. No significant bony findings. IMPRESSION: 1. 15 x 10 mm left lower lobe pulmonary lesion appears relatively stable in size when compared to the prior outside PET-CT. 2. New bilobed lesion in the right lower lobe just distal to a slightly dilated bronchus. This could be mucoid impaction or small endobronchial lesion. Recommend continued surveillance. 3. Several small, sub 4 mm, pulmonary nodules in both lungs. 4. No mediastinal or hilar mass or adenopathy. 5. Stable emphysematous changes and pulmonary scarring. 6. Significant atherosclerotic calcifications involving the thoracic and abdominal aorta and branch vessels including dense and extensive three-vessel coronary artery calcifications. Aortic Atherosclerosis (ICD10-I70.0) and Emphysema (ICD10-J43.9). Electronically Signed   By: Marijo Sanes M.D.   On: 01/08/2018 15:25  I have independently reviewed the above radiology studies  and reviewed the findings with the patient.   Description:  CT OF THE CHEST, LUNG CANCER SCREENING   HISTORY: lung cancer screening   COMPARISON: 08/05/2016   TECHNIQUE: CT images of the chest were obtained without intravenous  contrast utilizing low dose technique.   FINDINGS:  Lung nodules, measured on series 2: Left lower  lobe nodular opacity is  similar in measured size but increased in soft tissue bulk, measuring 17 x  21 mm image 171, and there is a new nodular component contacting the  inferior pleura measuring 9 x 11 mm image 173, also seen on coronal series  603 image 84. New 7 mm (mean diameter) cavitary nodule right lower lobe  series 2 image 141. Otherwise similar innumerable nodules including 4 mm  left upper lobe image 58, 4 mm left upper lobe image 78, 5 mm subpleural  superior left upper lobe image 69.   Airways: Diffuse bronchial wall thickening suggesting chronic bronchitis.   COPD: Mild upper lobe predominant emphysema.   Additional lung findings: Interval development of partial collapse  anterior segment right upper lobe.   Pleura:No pleural effusion.   Heart/Coronary Arteries: Heart size within normal limits. Heavy  atherosclerotic coronary artery calcifications. Coronary stents present.   Mediastinum/Hila/Axilla: No lymphadenopathy.   Other findings: Bilateral gynecomastia. Cholecystectomy   ______________________     Impression:    1. Enlarging left lower lobe nodular opacity with new adjacent 10 mm  (mean diameter) nodule contacting the inferior pleura. 2. New 7 mm mean  diameter cavitary nodule right lower lobe. 3. Otherwise stable nodules  as above.   LUNG-RADS CATEGORY:   4 - Suspicious.   RECOMMENDATION: Thoracic surgery referral.    Electronically Signed By: Minette Brine Electronically Signed On:  08/07/2017 2:45 PM     Diagnosis:  1214 LUNGRADS 4A: SUSPICIOUS NODULE      Description:  Procedure: Following at least four-hour fasting, the patient's blood  glucose was 121 mg/dL. One hour following the injection of 12.315 mCi of  F-[id number], low dose CT images were obtained from the orbital meatal line  through the mid thigh. Then, PET images were obtained through the same  region. Attenuation corrected images were constructed using the CT scan.    Fused images of PET and CT were reviewed. The standard uptake values (SUV)  reported below are maximum values within a region of interest, expressed  in gm/ml.   Comparison: Multiple prior CTs   Findings: Evaluation of the brain demonstrates no abnormality in the  radiotracer distribution of the cerebrum or cerebellum. Atherosclerotic  calcifications are seen in the vertebrobasilar and internal carotid  arteries. Mucosal thickening is seen in the right ethmoid air cells.   Evaluation of the neck demonstrates physiologic uptake within the salivary  glands and remaining cervical structures. No foci of hypermetabolic  activity are identified within cervical lymph nodes. Atherosclerotic  calcifications are seen within the carotid arterial tree.   Evaluation of the thorax demonstrates physiologic myocardial activity.  Atherosclerotic calcifications are seen in the thoracic aorta and branch  vessels to include severe coronary artery calcifications. Focal  collapse/consolidation is again seen in the anterior right upper lobe as  was noted on the most recent prior chest CT examination. Mild  hypermetabolic activity is seen in the branching nodular opacity in the  posteromedial left lower lobe (SUV max 2.3), nonspecific. A small  peribronchovascular nodule in the posterolateral right lower lobe which  was new on the most recent prior study demonstrates minimal nonspecific  hypermetabolic activity. Additional small nodules are present which are  below size resolution for the PET portion of this examination. There are  no hypermetabolic mediastinal or hilar lymph nodes.   Examination of the abdomen and pelvis shows physiologic uptake within the  adrenal glands, liver, spleen,  kidneys, ureters and bladder. The pancreas  is unremarkable. Status post cholecystectomy. There are no hypermetabolic  lymph nodes. Atherosclerotic calcifications are seen throughout the  abdominal aorta and branch  vessels. No AAA. A few colonic diverticula are  noted. There is no evidence of bowel obstruction. Diffuse hypermetabolic  activity is seen within the stomach which is greater than expected for  physiologic activity. A small lipoma is seen in the right lateral thigh  musculature.   No worrisome hypermetabolic osseous lesions are noted. Degenerative  changes are noted with mild expected hypermetabolic activity.     Impression:  1. Focal collapse/consolidation is again seen in the anterior right upper  lobe as was noted on the recent prior chest CT examination. Small  peribronchovascular nodule in the posterolateral right lower lobe which  was new on the most recent prior study demonstrates minimal nonspecific  activity. Additional small nodules are present which are below size  resolution for the PET portion of this examination. Clinical correlation  for signs/symptoms of infection and appropriate treatment are recommended.   2. Mild nonspecific hypermetabolic activity is seen in the branching  nodular opacity in the posteromedial left lower lobe. Continued attention  on follow-up low dose CT is recommended.   3. Diffuse hypermetabolic activity within the stomach which is greater  than expected for physiologic activity. Clinical correlation and  correlation with endoscopic findings is recommended.   Electronically Signed By: Ishmael Holter Electronically Signed  On: 08/15/2017 2:37 PM      I have independently reviewed the above radiologic studies.  Recent Lab Findings: Lab Results  Component Value Date   WBC 9.9 03/05/2013   HGB 15.3 04/26/2015   HCT 45.0 04/26/2015   PLT 170 03/05/2013   GLUCOSE 103 (H) 04/26/2015   CHOL  10/28/2009    80        ATP III CLASSIFICATION:  <200     mg/dL   Desirable  200-239  mg/dL   Borderline High  >=240    mg/dL   High          TRIG 131 10/28/2009   HDL 29 (L) 10/28/2009   LDLCALC  10/28/2009    25        Total  Cholesterol/HDL:CHD Risk Coronary Heart Disease Risk Table                     Men   Women  1/2 Average Risk   3.4   3.3  Average Risk       5.0   4.4  2 X Average Risk   9.6   7.1  3 X Average Risk  23.4   11.0        Use the calculated Patient Ratio above and the CHD Risk Table to determine the patient's CHD Risk.        ATP III CLASSIFICATION (LDL):  <100     mg/dL   Optimal  100-129  mg/dL   Near or Above                    Optimal  130-159  mg/dL   Borderline  160-189  mg/dL   High  >190     mg/dL   Very High   NA 139 04/26/2015   K 4.7 04/26/2015   CL 107 04/26/2015   CREATININE 2.30 (H) 04/26/2015   BUN 62 (H) 04/26/2015   CO2 26 03/05/2013   TSH 1.330 Test methodology is 3rd generation  TSH 10/26/2009   INR 1.01 10/26/2009   HGBA1C (H) 10/26/2009    6.7 (NOTE) The ADA recommends the following therapeutic goal for glycemic control related to Hgb A1c measurement: Goal of therapy: <6.5 Hgb A1c  Reference: American Diabetes Association: Clinical Practice Recommendations 2010, Diabetes Care, 2010, 33: (Suppl  1).   Chronic Kidney Disease   Stage I     GFR >90  Stage II    GFR 60-89  Stage IIIA GFR 45-59  Stage IIIB GFR 30-44  Stage IV   GFR 15-29  Stage V    GFR  <15  Lab Results  Component Value Date   CREATININE 2.30 (H) 04/26/2015   CrCl cannot be calculated (Patient's most recent lab result is older than the maximum 21 days allowed.).  FEV1 1.83   51% 11% improvement with bronchdilators  DLCO 18.5  51% Pulmonary Function Diagnosis: Severe Obstructive Airways Disease Insignificant response to bronchodilator Moderate Restriction -Parenchymal Moderately severe Diffusion Defect  Assessment / Plan:   1/Branching nodular opacity in the posteromedial left lower lobe-nonspecific finding on PET scan, 15 x 10 mm left lower lobe pulmonary lesion appears  stable in size  compared to the prior outside PET-CT.   New bilobed lesion in the right lower lobe just distal to  a slightly dilated bronchus. This could be mucoid impaction or small endobronchial lesion. Recommend continued surveillance.  3.  Several small, sub 4 mm, pulmonary nodules in both lungs.  4 Focal collapse/consolidation is again seen in the anterior right upper lobe  5/Chronic renal insufficiency most recent creatinine in the system 2  years ago 6/long-term smoking history and symptoms suggestive of significant COPD-formal pulmonary function studies will be obtained 7/known significant coronary occlusive disease with previous stents placed currently on Plavix  Films reviewed with patient. He prefers continued observation. Will get follow up super d in 5-6 months   Grace Isaac MD      [address] [place] [address] Office [phone]   Beeper [phone]  01/08/2018 4:12 PM

## 2018-02-17 ENCOUNTER — Ambulatory Visit (INDEPENDENT_AMBULATORY_CARE_PROVIDER_SITE_OTHER): Payer: Medicare Other | Admitting: Podiatry

## 2018-02-17 ENCOUNTER — Ambulatory Visit (INDEPENDENT_AMBULATORY_CARE_PROVIDER_SITE_OTHER): Payer: Medicare Other

## 2018-02-17 DIAGNOSIS — M7742 Metatarsalgia, left foot: Secondary | ICD-10-CM | POA: Diagnosis not present

## 2018-02-17 DIAGNOSIS — M205X1 Other deformities of toe(s) (acquired), right foot: Secondary | ICD-10-CM | POA: Diagnosis not present

## 2018-02-17 DIAGNOSIS — M7741 Metatarsalgia, right foot: Secondary | ICD-10-CM

## 2018-02-17 DIAGNOSIS — M779 Enthesopathy, unspecified: Secondary | ICD-10-CM

## 2018-02-18 DIAGNOSIS — I10 Essential (primary) hypertension: Secondary | ICD-10-CM | POA: Diagnosis not present

## 2018-02-18 DIAGNOSIS — Z794 Long term (current) use of insulin: Secondary | ICD-10-CM | POA: Diagnosis not present

## 2018-02-18 DIAGNOSIS — N183 Chronic kidney disease, stage 3 (moderate): Secondary | ICD-10-CM | POA: Diagnosis not present

## 2018-02-18 DIAGNOSIS — E1129 Type 2 diabetes mellitus with other diabetic kidney complication: Secondary | ICD-10-CM | POA: Diagnosis not present

## 2018-02-18 DIAGNOSIS — Z6830 Body mass index (BMI) 30.0-30.9, adult: Secondary | ICD-10-CM | POA: Diagnosis not present

## 2018-02-18 NOTE — Progress Notes (Addendum)
Subjective: Travis [name] presents the office today for follow-up evaluation of capsulitis, swelling to the right 1.  He has been using the Voltaren gel he states this is been helping quite a bit.  Still some swelling to the area but overall is much improved.  In general he gets pain to both of his feet mostly goes barefoot this is gone for some time is been getting worse the last couple of days.  No recent injury or trauma or any falls.  He said no swelling or redness to the foot he denies any numbness or tingling otherwise is new.  He presents today with his wife. Presents the office today with his wife. Denies any systemic complaints such as fevers, chills, nausea, vomiting. No acute changes since last appointment, and no other complaints at this time.   Objective: AAO x3, NAD DP/PT pulses palpable bilaterally, CRT less than 3 seconds On the right foot submetatarsal one does contribute some swelling to the area and he does have the area is much improved I am still concerned about the swelling.  There is no overlying erythema or increase in warmth.  There is no fluctuation or crepitation there is no malodor.  There is no appear to be any clinical signs of infection.  There is some mild discomfort to the submetatarsal area to bilateral feet.  There is no overlying edema, erythema, increase in warmth.  There is no area pinpoint bony tenderness or pain to vibratory sensation bilaterally.  There is no swelling or redness otherwise.  Does not relate any recent injury this is worse with prolonged standing or walking. No open lesions or pre-ulcerative lesions.  No pain with calf compression, swelling, warmth, erythema  Assessment: Continued swelling right submetatarsal 1 with metatarsalgia, capsulitis  Plan: -All treatment options discussed with the patient including all alternatives, risks, complications.  -Given his ongoing symptoms in the right foot a repeat x-rays today.  Respiratory increased calcification  along submetatarsal 1 compared to his prior x-ray.  Given the ongoing nature of his symptoms although he does relate that is significantly improved I do recommend an MRI.  He was very hesitant to do this.  Because of this I will order an ultrasound of the right foot submetatarsal 1 to evaluate the area of swelling.  This was ordered for him today.  In general we discussed a more custom orthotic.  I did add new metatarsal pads to his orthotics that he had. -Follow-up after ultrasound or sooner if needed.  Call any questions or concerns.  Trula Slade DPM

## 2018-02-19 ENCOUNTER — Telehealth: Payer: Self-pay | Admitting: *Deleted

## 2018-02-19 DIAGNOSIS — M7989 Other specified soft tissue disorders: Secondary | ICD-10-CM

## 2018-02-19 NOTE — Telephone Encounter (Signed)
-----   Message from Trula Slade, DPM sent at 02/18/2018  7:49 PM EDT ----- Can you please order an ultrasound of the right foot to look at a soft tissue mass submet 1 on the right? On x-ray there is some calcification under the soft tissue mass that needs to be evaluated. Thanks.

## 2018-02-25 NOTE — Telephone Encounter (Signed)
[facility] states they need to know what body part the [place] is for. I told Travis [name] it was the right foot.

## 2018-02-27 ENCOUNTER — Ambulatory Visit
Admission: RE | Admit: 2018-02-27 | Discharge: 2018-02-27 | Disposition: A | Discharge status: Home / Self Care | Payer: Medicare Other | Source: Ambulatory Visit | Attending: Podiatry | Admitting: Podiatry

## 2018-02-27 DIAGNOSIS — M7989 Other specified soft tissue disorders: Secondary | ICD-10-CM

## 2018-02-27 DIAGNOSIS — R2241 Localized swelling, mass and lump, right lower limb: Secondary | ICD-10-CM | POA: Diagnosis not present

## 2018-03-06 ENCOUNTER — Telehealth: Payer: Self-pay | Admitting: Podiatry

## 2018-03-06 NOTE — Telephone Encounter (Signed)
I called the patient to discuss the ultrasound results. Discussed both conservative vs surgery. He does not want surgery to his feet. He states that it is starting to get painful again and has hardened some. I have ordered a compound cream through [facility] to include verapamil and directed on use. If any worsening to call the office. Follow-up in 4-6 weeks. He agrees.

## 2018-03-06 NOTE — Telephone Encounter (Signed)
I had an ultrasound done of my foot last Friday and I was calling to see if Dr. Jacqualyn Posey wanted me to make an appointment to find out what he found. Get back and let me know something. The number is [phone]. Thank you ma'am.

## 2018-03-13 DIAGNOSIS — E1129 Type 2 diabetes mellitus with other diabetic kidney complication: Secondary | ICD-10-CM | POA: Diagnosis not present

## 2018-03-13 DIAGNOSIS — Z794 Long term (current) use of insulin: Secondary | ICD-10-CM | POA: Diagnosis not present

## 2018-03-13 DIAGNOSIS — I10 Essential (primary) hypertension: Secondary | ICD-10-CM | POA: Diagnosis not present

## 2018-03-13 DIAGNOSIS — Z6830 Body mass index (BMI) 30.0-30.9, adult: Secondary | ICD-10-CM | POA: Diagnosis not present

## 2018-03-13 DIAGNOSIS — I7389 Other specified peripheral vascular diseases: Secondary | ICD-10-CM | POA: Diagnosis not present

## 2018-03-13 DIAGNOSIS — I2510 Atherosclerotic heart disease of native coronary artery without angina pectoris: Secondary | ICD-10-CM | POA: Diagnosis not present

## 2018-03-13 DIAGNOSIS — J449 Chronic obstructive pulmonary disease, unspecified: Secondary | ICD-10-CM | POA: Diagnosis not present

## 2018-03-16 ENCOUNTER — Telehealth: Payer: Self-pay | Admitting: Podiatry

## 2018-03-16 NOTE — Telephone Encounter (Signed)
Left message to call again for information concerning the [facility] Cream and if he wanted to discuss his diabetic shoes to call [phone].

## 2018-03-16 NOTE — Telephone Encounter (Signed)
This is Stryker Veasey, we are playing phone tag. Call me back at [phone]. Thank you.

## 2018-03-16 NOTE — Telephone Encounter (Signed)
I'm leaving for [facility] on Thursday. I'm calling because I have some questions about the cream Dr. Jacqualyn Posey ordered through a [facility]. Also, I'm calling to see if my diabetic shoes are in? Please give me a call back at [phone]. Thank you.

## 2018-03-16 NOTE — Telephone Encounter (Signed)
Pt asked how often use the pain cream and if it could be used on both feet, and what about the fibroma cream. I told pt to use the pain cream on both feet 3-4 time daily and lump cream once daily.

## 2018-04-03 ENCOUNTER — Telehealth: Payer: Self-pay | Admitting: *Deleted

## 2018-04-03 MED ORDER — DICLOFENAC SODIUM 1 % TD GEL: 2.0000 g | Freq: Four times a day (QID) | TRANSDERMAL | 2 refills | Status: DC

## 2018-04-03 NOTE — Telephone Encounter (Signed)
Refill request for voltaren gel 1%. Dr. Jacqualyn Posey states refill as previously.

## 2018-04-09 ENCOUNTER — Ambulatory Visit (INDEPENDENT_AMBULATORY_CARE_PROVIDER_SITE_OTHER): Payer: Medicare Other | Admitting: Orthotics

## 2018-04-09 DIAGNOSIS — M205X1 Other deformities of toe(s) (acquired), right foot: Secondary | ICD-10-CM | POA: Diagnosis not present

## 2018-04-09 DIAGNOSIS — I739 Peripheral vascular disease, unspecified: Secondary | ICD-10-CM

## 2018-04-09 DIAGNOSIS — M779 Enthesopathy, unspecified: Secondary | ICD-10-CM | POA: Diagnosis not present

## 2018-04-09 DIAGNOSIS — E1121 Type 2 diabetes mellitus with diabetic nephropathy: Secondary | ICD-10-CM

## 2018-04-09 NOTE — Progress Notes (Signed)

## 2018-04-20 DIAGNOSIS — E1122 Type 2 diabetes mellitus with diabetic chronic kidney disease: Secondary | ICD-10-CM | POA: Diagnosis not present

## 2018-04-20 DIAGNOSIS — I2510 Atherosclerotic heart disease of native coronary artery without angina pectoris: Secondary | ICD-10-CM | POA: Diagnosis not present

## 2018-04-20 DIAGNOSIS — N2581 Secondary hyperparathyroidism of renal origin: Secondary | ICD-10-CM | POA: Diagnosis not present

## 2018-04-20 DIAGNOSIS — N183 Chronic kidney disease, stage 3 (moderate): Secondary | ICD-10-CM | POA: Diagnosis not present

## 2018-04-20 DIAGNOSIS — I739 Peripheral vascular disease, unspecified: Secondary | ICD-10-CM | POA: Diagnosis not present

## 2018-04-20 DIAGNOSIS — E782 Mixed hyperlipidemia: Secondary | ICD-10-CM | POA: Diagnosis not present

## 2018-04-20 DIAGNOSIS — I129 Hypertensive chronic kidney disease with stage 1 through stage 4 chronic kidney disease, or unspecified chronic kidney disease: Secondary | ICD-10-CM | POA: Diagnosis not present

## 2018-04-20 DIAGNOSIS — M109 Gout, unspecified: Secondary | ICD-10-CM | POA: Diagnosis not present

## 2018-04-20 DIAGNOSIS — D631 Anemia in chronic kidney disease: Secondary | ICD-10-CM | POA: Diagnosis not present

## 2018-04-20 DIAGNOSIS — Z0000 Encounter for general adult medical examination without abnormal findings: Secondary | ICD-10-CM | POA: Diagnosis not present

## 2018-04-30 ENCOUNTER — Other Ambulatory Visit: Payer: Self-pay | Admitting: Cardiothoracic Surgery

## 2018-04-30 DIAGNOSIS — R911 Solitary pulmonary nodule: Secondary | ICD-10-CM

## 2018-05-21 DIAGNOSIS — Z794 Long term (current) use of insulin: Secondary | ICD-10-CM | POA: Diagnosis not present

## 2018-05-21 DIAGNOSIS — Z6830 Body mass index (BMI) 30.0-30.9, adult: Secondary | ICD-10-CM | POA: Diagnosis not present

## 2018-05-21 DIAGNOSIS — I10 Essential (primary) hypertension: Secondary | ICD-10-CM | POA: Diagnosis not present

## 2018-05-21 DIAGNOSIS — N183 Chronic kidney disease, stage 3 (moderate): Secondary | ICD-10-CM | POA: Diagnosis not present

## 2018-05-21 DIAGNOSIS — N2581 Secondary hyperparathyroidism of renal origin: Secondary | ICD-10-CM | POA: Diagnosis not present

## 2018-05-21 DIAGNOSIS — E1129 Type 2 diabetes mellitus with other diabetic kidney complication: Secondary | ICD-10-CM | POA: Diagnosis not present

## 2018-06-04 ENCOUNTER — Ambulatory Visit
Admission: RE | Admit: 2018-06-04 | Discharge: 2018-06-04 | Disposition: A | Discharge status: Home / Self Care | Payer: Medicare Other | Source: Ambulatory Visit | Attending: Cardiothoracic Surgery | Admitting: Cardiothoracic Surgery

## 2018-06-04 ENCOUNTER — Ambulatory Visit: Payer: Medicare Other | Admitting: Cardiothoracic Surgery

## 2018-06-04 DIAGNOSIS — R918 Other nonspecific abnormal finding of lung field: Secondary | ICD-10-CM | POA: Diagnosis not present

## 2018-06-04 DIAGNOSIS — R911 Solitary pulmonary nodule: Secondary | ICD-10-CM

## 2018-06-05 ENCOUNTER — Other Ambulatory Visit: Payer: Self-pay

## 2018-06-05 ENCOUNTER — Ambulatory Visit (INDEPENDENT_AMBULATORY_CARE_PROVIDER_SITE_OTHER): Payer: Medicare Other | Admitting: Cardiothoracic Surgery

## 2018-06-05 VITALS — BP 159/66 | HR 56 | Resp 20 | Ht 72.0 in | Wt 215.0 lb

## 2018-06-05 DIAGNOSIS — R918 Other nonspecific abnormal finding of lung field: Secondary | ICD-10-CM

## 2018-06-05 NOTE — Progress Notes (Signed)
[address]       [place] [address]             [phone]                    Coye M Stegenga [facility] Medical Record #[id number] Date of Birth: [birth year]  Referring: [name], PA-C Primary Care: [name], MD  Chief Complaint:    Chief Complaint  Patient presents with  . Lung Lesion    6 month f/u with Chest CT    History of Present Illness:    Travis [name] 67 y.o. male is seen in the office  today for follow-up CT of the chest. The  patient notes that several years ago he began having lung cancer screening CTs  is done through the [place]. a follow-upscan was performed and he was told of lesion on the scan "looked mushy".  PET scan was done through the [place] system and the patient was referred to thoracic surgery.  The patient gives a history of smoking for many years since age 64, up to a pack a day over that time.  Most recently he quit -05/09/2015.   Notes his past history is significant for multiple cardiac stents in 2000. ,  Last cath was approximately 8 years ago.  He has a history of diabetes mellitus and chronic renal insufficiency.  Patient returns today with a follow-up CT scan today to compare to scan done in February 2019    Current Activity/ Functional Status:  Patient is independent with mobility/ambulation, transfers, ADL's, IADL's.   Zubrod Score: At the time of surgery this patient's most appropriate activity status/level should be described as: []     0    Normal activity, no symptoms [x]     1    Restricted in physical strenuous activity but ambulatory, able to do out light work []     2    Ambulatory and capable of self care, unable to do work activities, up and about               >50 % of waking hours                              []     3    Only limited self care, in bed greater than 50% of waking hours []     4    Completely disabled, no self care, confined to bed or chair []     5    Moribund Patient notes that he is limited in  ambulation because "his legs lock up".  Past Medical History:  Diagnosis Date  . CAD (coronary artery disease)   . Coronary artery disease    PCA 1994l; PCI august 2003 of an obtust marginal. Catheterization at the time reveal: left main normal; LAD 50-75% between first and second diagonals, 50% after lesion; left circumflex 95% in obtuse marg. RCA 100% proximal.  . Diabetes mellitus    type 2  . Hyperlipemia   . Hypertension   . Kidney disease    chronic; stage 2  . Myocardial infarction ([place])   . Osteoarthritis   . Peripheral vascular disease ([place])   . Tobacco abuse     Past Surgical History:  Procedure Laterality Date  . APPENDECTOMY    . CHOLECYSTECTOMY    . COPD    . CORONARY ANGIOPLASTY     stent  times 2, last cath 3 years ago  . NERVE EXPLORATION  07/29/2012   Procedure: NERVE EXPLORATION;  Surgeon: Schuyler Amor, MD;  Location: [facility];  Service: Orthopedics;  Laterality: Left;  Exploration /repair nerve and tendon left thumb  . PERIPHERAL VASCULAR CATHETERIZATION N/A 04/26/2015   Procedure: Abdominal Aortogram;  Surgeon: Rosetta Posner, MD;  Location: [facility];  Service: Cardiovascular;  Laterality: N/A;    Family History  Problem Relation Age of Onset  . Coronary artery disease Mother   . Heart disease Mother   . Cancer Father   . Stroke Other   . Heart failure Other   . Pulmonary embolism Other   . Diabetes Brother   . Hypertension Brother   . Heart attack Brother   . Peripheral vascular disease Brother   . Heart disease Brother   . Hypertension Sister       Social History   Tobacco Use  Smoking Status Former Smoker  . Packs/day: 0.50  . Years: 50.00  . Pack years: 25.00  . Types: Cigarettes  . Last attempt to quit: 05/09/2015  . Years since quitting: 3.0  Smokeless Tobacco Former Systems developer  . Types: Chew    Social History   Substance and Sexual Activity  Alcohol Use No  . Alcohol/week: 0.0 oz     Allergies    Allergen Reactions  . Penicillins Other (See Comments)    Childhood reaction    Current Outpatient Medications  Medication Sig Dispense Refill  . acetaminophen (TYLENOL) 500 MG tablet Take 1,000 mg by mouth daily as needed for pain.    [name] amLODipine (NORVASC) 10 MG tablet Take 10 mg by mouth daily.      [name] aspirin 81 MG tablet Take 81 mg by mouth daily.    . benazepril (LOTENSIN) 40 MG tablet Take 40 mg by mouth 2 (two) times daily.     . calcitRIOL (ROCALTROL) 0.25 MCG capsule Take by mouth daily.  6  . Cinnamon 500 MG capsule Take 500 mg by mouth daily.    . clopidogrel (PLAVIX) 75 MG tablet Take 1 tablet (75 mg total) by mouth daily. 90 tablet 3  . diclofenac sodium (VOLTAREN) 1 % GEL Apply 2 g topically 4 (four) times daily. Rub into affected area of foot 2 to 4 times daily 100 g 2  . furosemide (LASIX) 80 MG tablet Take 160 mg by mouth 2 (two) times daily.     . insulin lispro (HUMALOG) 100 UNIT/ML injection Inject 11 Units into the skin 2 (two) times daily before lunch and supper.     . losartan (COZAAR) 100 MG tablet Take 100 mg by mouth 2 (two) times daily.     . metoprolol (LOPRESSOR) 100 MG tablet Take 100 mg by mouth 2 (two) times daily.      . mirtazapine (REMERON) 15 MG tablet Take 15 mg by mouth at bedtime.    Salley Scarlet FORMUL[facility]  Scar Cream -  Verapamil 10%, Pentoxifylline 5% Apply 1-2 grams to affected area 3-4 times daily Qty. 120 gm 3 refills    . rosuvastatin (CRESTOR) 10 MG tablet Take 5 mg by mouth daily.    Nelva Nay SOLOSTAR 300 UNIT/ML SOPN Inject 28 Units into the skin daily before breakfast.     . TURMERIC PO Take 400 mg by mouth 2 (two) times daily.     No current facility-administered medications for this visit.  Review of Systems:  Review of Systems  Constitutional: Negative.   HENT: Negative.   Eyes: Negative.   Respiratory: Positive for cough. Negative for hemoptysis, sputum production, shortness of breath and wheezing.    Cardiovascular: Negative.   Gastrointestinal: Negative.   Genitourinary: Negative.   Musculoskeletal: Negative.   Skin: Negative.   Neurological: Negative.   Endo/Heme/Allergies: Negative.   Psychiatric/Behavioral: Negative.    Physical Exam: Pulse 85   Resp 20   Ht 6' (1.829 m)   Wt 215 lb (97.5 kg)   SpO2 96% Comment: RA  BMI 29.16 kg/m   PHYSICAL EXAMINATION: General appearance: alert, cooperative and no distress Head: Normocephalic, without obvious abnormality, atraumatic Neck: no adenopathy, no carotid bruit, no JVD, supple, symmetrical, trachea midline and thyroid not enlarged, symmetric, no tenderness/mass/nodules Lymph nodes: Cervical, supraclavicular, and axillary nodes normal. Resp: clear to auscultation bilaterally Back: symmetric, no curvature. ROM normal. No CVA tenderness. Cardio: regular rate and rhythm, S1, S2 normal, no murmur, click, rub or gallop GI: soft, non-tender; bowel sounds normal; no masses,  no organomegaly Extremities: extremities normal, atraumatic, no cyanosis or edema and Homans sign is negative, no sign of DVT Neurologic: Grossly normal  Diagnostic Studies & Laboratory data:     Recent Radiology Findings:  Ct Super D Chest Wo Contrast  Result Date: 06/04/2018 CLINICAL DATA:  Pulmonary nodule EXAM: CT CHEST WITHOUT CONTRAST TECHNIQUE: Multidetector CT imaging of the chest was performed using thin slice collimation for electromagnetic bronchoscopy planning purposes, without intravenous contrast. COMPARISON:  01/08/2018 FINDINGS: Cardiovascular: The heart size is normal. No substantial pericardial effusion. Coronary artery calcification is evident. Atherosclerotic calcification is noted in the wall of the thoracic aorta. Mediastinum/Nodes: No mediastinal lymphadenopathy. No evidence for gross hilar lymphadenopathy although assessment is limited by the lack of intravenous contrast on today's study. The esophagus has normal imaging features. There is  no axillary lymphadenopathy. Lungs/Pleura: Interval progression of posterior right lower lobe bilobed nodule now measuring 16 x 11 mm compared to 10 x 6 mm on the prior study. Multi lobular lesion in the inferior left lower lobe measures 20 x 12 mm today compared to 15 x 10 mm previously. Similar appearance of other scattered very tiny bilateral pulmonary nodules. No focal airspace consolidation. No pulmonary edema or pleural effusion. Upper Abdomen: Unremarkable Musculoskeletal: Bilateral symmetric gynecomastia. No worrisome lytic or sclerotic osseous abnormality. IMPRESSION: 1. Interval progression of left lower lobe and right lower lobe pulmonary nodules. 2. Scattered tiny bilateral nodules measuring less than 4 mm are stable in the interval. 3. No lymphadenopathy in the thorax. 4.  Aortic Atherosclerois (ICD10-170.0) Electronically Signed   By: Misty Stanley M.D.   On: 06/04/2018 12:57   I have independently reviewed the above radiology studies  and reviewed the findings with the patient.  Ct Super D Chest Wo Contrast  Result Date: 01/08/2018 CLINICAL DATA:  Left lower lobe pulmonary nodule. Preoperative planning. EXAM: CT CHEST WITHOUT CONTRAST TECHNIQUE: Multidetector CT imaging of the chest was performed using thin slice collimation for electromagnetic bronchoscopy planning purposes, without intravenous contrast. COMPARISON:  Outside CT scans and PET-CT. The PET-CT is dated 08/15/2017 and is from [facility] FINDINGS: Cardiovascular: The heart is within normal limits in size. No pericardial effusion. Extensive atherosclerotic calcifications involving the thoracic aorta and branch vessels including dense three-vessel coronary artery calcifications. No focal aneurysm. Mediastinum/Nodes: Small scattered sub 8 mm mediastinal and hilar lymph nodes. No mass or adenopathy. The esophagus is grossly normal. Lungs/Pleura: 15 x 10 mm left lower  lobe pulmonary lesion noted on image number 102. Several small sub 4 mm  nodules are noted in both lungs. These appears stable. 9.5 x 5.5 mm bilobed nodular density in the right lower lobe on image number 90 associated with a slightly dilated bronchus just proximally. This is new since the prior CT scan and could represent mucoid impaction or endobronchial lesion. Stable underlying emphysematous changes. Upper Abdomen: No significant upper abdominal findings. There is a stable left adrenal gland adenoma. No findings suspicious for hepatic metastatic disease. Advanced atherosclerotic calcifications involving the upper abdominal aorta and visualized branch vessels. Musculoskeletal: No chest wall mass, supraclavicular or axillary lymphadenopathy. The thyroid gland is grossly normal. Mild bilateral symmetric gynecomastia. No significant bony findings. IMPRESSION: 1. 15 x 10 mm left lower lobe pulmonary lesion appears relatively stable in size when compared to the prior outside PET-CT. 2. New bilobed lesion in the right lower lobe just distal to a slightly dilated bronchus. This could be mucoid impaction or small endobronchial lesion. Recommend continued surveillance. 3. Several small, sub 4 mm, pulmonary nodules in both lungs. 4. No mediastinal or hilar mass or adenopathy. 5. Stable emphysematous changes and pulmonary scarring. 6. Significant atherosclerotic calcifications involving the thoracic and abdominal aorta and branch vessels including dense and extensive three-vessel coronary artery calcifications. Aortic Atherosclerosis (ICD10-I70.0) and Emphysema (ICD10-J43.9). Electronically Signed   By: Marijo Sanes M.D.   On: 01/08/2018 15:25  I have independently reviewed the above radiology studies  and reviewed the findings with the patient.   Description:  CT OF THE CHEST, LUNG CANCER SCREENING   HISTORY: lung cancer screening   COMPARISON: 08/05/2016   TECHNIQUE: CT images of the chest were obtained without intravenous  contrast utilizing low dose technique.   FINDINGS:    Lung nodules, measured on series 2: Left lower lobe nodular opacity is  similar in measured size but increased in soft tissue bulk, measuring 17 x  21 mm image 171, and there is a new nodular component contacting the  inferior pleura measuring 9 x 11 mm image 173, also seen on coronal series  603 image 84. New 7 mm (mean diameter) cavitary nodule right lower lobe  series 2 image 141. Otherwise similar innumerable nodules including 4 mm  left upper lobe image 58, 4 mm left upper lobe image 78, 5 mm subpleural  superior left upper lobe image 69.   Airways: Diffuse bronchial wall thickening suggesting chronic bronchitis.   COPD: Mild upper lobe predominant emphysema.   Additional lung findings: Interval development of partial collapse  anterior segment right upper lobe.   Pleura:No pleural effusion.   Heart/Coronary Arteries: Heart size within normal limits. Heavy  atherosclerotic coronary artery calcifications. Coronary stents present.   Mediastinum/Hila/Axilla: No lymphadenopathy.   Other findings: Bilateral gynecomastia. Cholecystectomy   ______________________     Impression:    1. Enlarging left lower lobe nodular opacity with new adjacent 10 mm  (mean diameter) nodule contacting the inferior pleura. 2. New 7 mm mean  diameter cavitary nodule right lower lobe. 3. Otherwise stable nodules  as above.   LUNG-RADS CATEGORY:   4 - Suspicious.   RECOMMENDATION: Thoracic surgery referral.    Electronically Signed By: Minette Brine Electronically Signed On:  08/07/2017 2:45 PM     Diagnosis:  1214 LUNGRADS 4A: SUSPICIOUS NODULE      Description:  Procedure: Following at least four-hour fasting, the patient's blood  glucose was 121 mg/dL. One hour following the injection of 12.315 mCi of  F-18-FDG,  low dose CT images were obtained from the orbital meatal line  through the mid thigh. Then, PET images were obtained through the same  region. Attenuation  corrected images were constructed using the CT scan.  Fused images of PET and CT were reviewed. The standard uptake values (SUV)  reported below are maximum values within a region of interest, expressed  in gm/ml.   Comparison: Multiple prior CTs   Findings: Evaluation of the brain demonstrates no abnormality in the  radiotracer distribution of the cerebrum or cerebellum. Atherosclerotic  calcifications are seen in the vertebrobasilar and internal carotid  arteries. Mucosal thickening is seen in the right ethmoid air cells.   Evaluation of the neck demonstrates physiologic uptake within the salivary  glands and remaining cervical structures. No foci of hypermetabolic  activity are identified within cervical lymph nodes. Atherosclerotic  calcifications are seen within the carotid arterial tree.   Evaluation of the thorax demonstrates physiologic myocardial activity.  Atherosclerotic calcifications are seen in the thoracic aorta and branch  vessels to include severe coronary artery calcifications. Focal  collapse/consolidation is again seen in the anterior right upper lobe as  was noted on the most recent prior chest CT examination. Mild  hypermetabolic activity is seen in the branching nodular opacity in the  posteromedial left lower lobe (SUV max 2.3), nonspecific. A small  peribronchovascular nodule in the posterolateral right lower lobe which  was new on the most recent prior study demonstrates minimal nonspecific  hypermetabolic activity. Additional small nodules are present which are  below size resolution for the PET portion of this examination. There are  no hypermetabolic mediastinal or hilar lymph nodes.   Examination of the abdomen and pelvis shows physiologic uptake within the  adrenal glands, liver, spleen, kidneys, ureters and bladder. The pancreas  is unremarkable. Status post cholecystectomy. There are no hypermetabolic  lymph nodes. Atherosclerotic calcifications are  seen throughout the  abdominal aorta and branch vessels. No AAA. A few colonic diverticula are  noted. There is no evidence of bowel obstruction. Diffuse hypermetabolic  activity is seen within the stomach which is greater than expected for  physiologic activity. A small lipoma is seen in the right lateral thigh  musculature.   No worrisome hypermetabolic osseous lesions are noted. Degenerative  changes are noted with mild expected hypermetabolic activity.     Impression:  1. Focal collapse/consolidation is again seen in the anterior right upper  lobe as was noted on the recent prior chest CT examination. Small  peribronchovascular nodule in the posterolateral right lower lobe which  was new on the most recent prior study demonstrates minimal nonspecific  activity. Additional small nodules are present which are below size  resolution for the PET portion of this examination. Clinical correlation  for signs/symptoms of infection and appropriate treatment are recommended.   2. Mild nonspecific hypermetabolic activity is seen in the branching  nodular opacity in the posteromedial left lower lobe. Continued attention  on follow-up low dose CT is recommended.   3. Diffuse hypermetabolic activity within the stomach which is greater  than expected for physiologic activity. Clinical correlation and  correlation with endoscopic findings is recommended.   Electronically Signed By: Ishmael Holter Electronically Signed  On: 08/15/2017 2:37 PM      I have independently reviewed the above radiologic studies.  Recent Lab Findings: Lab Results  Component Value Date   WBC 9.9 03/05/2013   HGB 15.3 04/26/2015   HCT 45.0 04/26/2015   PLT 170 03/05/2013  GLUCOSE 103 (H) 04/26/2015   CHOL  10/28/2009    80        ATP III CLASSIFICATION:  <200     mg/dL   Desirable  200-239  mg/dL   Borderline High  >=240    mg/dL   High          TRIG 131 10/28/2009   HDL 29 (L) 10/28/2009    LDLCALC  10/28/2009    25        Total Cholesterol/HDL:CHD Risk Coronary Heart Disease Risk Table                     Men   Women  1/2 Average Risk   3.4   3.3  Average Risk       5.0   4.4  2 X Average Risk   9.6   7.1  3 X Average Risk  23.4   11.0        Use the calculated Patient Ratio above and the CHD Risk Table to determine the patient's CHD Risk.        ATP III CLASSIFICATION (LDL):  <100     mg/dL   Optimal  100-129  mg/dL   Near or Above                    Optimal  130-159  mg/dL   Borderline  160-189  mg/dL   High  >190     mg/dL   Very High   NA 139 04/26/2015   K 4.7 04/26/2015   CL 107 04/26/2015   CREATININE 2.30 (H) 04/26/2015   BUN 62 (H) 04/26/2015   CO2 26 03/05/2013   TSH 1.330 Test methodology is 3rd generation TSH 10/26/2009   INR 1.01 10/26/2009   HGBA1C (H) 10/26/2009    6.7 (NOTE) The ADA recommends the following therapeutic goal for glycemic control related to Hgb A1c measurement: Goal of therapy: <6.5 Hgb A1c  Reference: American Diabetes Association: Clinical Practice Recommendations 2010, Diabetes Care, 2010, 33: (Suppl  1).   Chronic Kidney Disease   Stage I     GFR >90  Stage II    GFR 60-89  Stage IIIA GFR 45-59  Stage IIIB GFR 30-44  Stage IV   GFR 15-29  Stage V    GFR  <15  Lab Results  Component Value Date   CREATININE 2.30 (H) 04/26/2015   CrCl cannot be calculated (Patient's most recent lab result is older than the maximum 21 days allowed.).  FEV1 1.83   51% 11% improvement with bronchdilators  DLCO 18.5  51% Pulmonary Function Diagnosis: Severe Obstructive Airways Disease Insignificant response to bronchodilator Moderate Restriction -Parenchymal Moderately severe Diffusion Defect  Assessment / Plan:   1/Branching nodular opacity in the posteromedial left lower lobe-nonspecific finding on PET scan,  2/Interval progression of left lower lobe and right lower lobe pulmonary nodules.  3/Chronic renal insufficiency most  recent creatinine in the system 2  years ago 4/long-term smoking history and symptoms suggestive of significant COPD-formal pulmonary function studies will be obtained 5/known significant coronary occlusive disease with previous stents placed currently on Plavix-checked with cardiology and is okay to temporarily hold his Plavix.  Because of easy bruising he had stopped his aspirin previously.  With CT documented interval progression of both left lower lobe and right lower lobe pulmonary nodules I recommend to the patient that we next proceeded with navigation bronchoscopy and attempt to biopsy both the right  and left lower lobe lesions.  I discussed with he and his wife the risk of false negative results, but with bilateral disease obtaining a tissue diagnosis of both lesions is necessary.  Risks and options of the procedure discussed with the patient in detail and he is willing to proceed.  We will plan for navigational bronchoscopy with biopsy of right and left lung lesions and placement of fiducial markers.   Grace Isaac MD      [address] [place] [address] Office [phone]   Beeper [phone]  06/05/2018 9:10 AM

## 2018-06-08 ENCOUNTER — Other Ambulatory Visit: Payer: Self-pay

## 2018-06-08 ENCOUNTER — Encounter (HOSPITAL_COMMUNITY): Payer: Self-pay | Admitting: *Deleted

## 2018-06-09 ENCOUNTER — Encounter (HOSPITAL_COMMUNITY)
Admission: RE | Disposition: A | Discharge status: Home / Self Care | Payer: Self-pay | Source: Ambulatory Visit | Attending: Cardiothoracic Surgery

## 2018-06-09 ENCOUNTER — Ambulatory Visit (HOSPITAL_COMMUNITY): Payer: Medicare Other | Admitting: Certified Registered"

## 2018-06-09 ENCOUNTER — Ambulatory Visit (HOSPITAL_COMMUNITY)
Admission: RE | Admit: 2018-06-09 | Discharge: 2018-06-09 | Disposition: A | Discharge status: Home / Self Care | Payer: Medicare Other | Source: Ambulatory Visit | Attending: Cardiothoracic Surgery | Admitting: Cardiothoracic Surgery

## 2018-06-09 ENCOUNTER — Ambulatory Visit (HOSPITAL_COMMUNITY): Payer: Medicare Other

## 2018-06-09 ENCOUNTER — Encounter (HOSPITAL_COMMUNITY): Payer: Self-pay | Admitting: *Deleted

## 2018-06-09 DIAGNOSIS — E1122 Type 2 diabetes mellitus with diabetic chronic kidney disease: Secondary | ICD-10-CM | POA: Insufficient documentation

## 2018-06-09 DIAGNOSIS — R918 Other nonspecific abnormal finding of lung field: Secondary | ICD-10-CM | POA: Diagnosis not present

## 2018-06-09 DIAGNOSIS — C3411 Malignant neoplasm of upper lobe, right bronchus or lung: Secondary | ICD-10-CM | POA: Insufficient documentation

## 2018-06-09 DIAGNOSIS — I252 Old myocardial infarction: Secondary | ICD-10-CM | POA: Insufficient documentation

## 2018-06-09 DIAGNOSIS — E785 Hyperlipidemia, unspecified: Secondary | ICD-10-CM | POA: Diagnosis not present

## 2018-06-09 DIAGNOSIS — Z794 Long term (current) use of insulin: Secondary | ICD-10-CM | POA: Insufficient documentation

## 2018-06-09 DIAGNOSIS — Z01818 Encounter for other preprocedural examination: Secondary | ICD-10-CM

## 2018-06-09 DIAGNOSIS — E119 Type 2 diabetes mellitus without complications: Secondary | ICD-10-CM | POA: Diagnosis not present

## 2018-06-09 DIAGNOSIS — I10 Essential (primary) hypertension: Secondary | ICD-10-CM | POA: Diagnosis not present

## 2018-06-09 DIAGNOSIS — Z87891 Personal history of nicotine dependence: Secondary | ICD-10-CM | POA: Insufficient documentation

## 2018-06-09 DIAGNOSIS — I739 Peripheral vascular disease, unspecified: Secondary | ICD-10-CM | POA: Diagnosis not present

## 2018-06-09 DIAGNOSIS — I129 Hypertensive chronic kidney disease with stage 1 through stage 4 chronic kidney disease, or unspecified chronic kidney disease: Secondary | ICD-10-CM | POA: Insufficient documentation

## 2018-06-09 DIAGNOSIS — Z09 Encounter for follow-up examination after completed treatment for conditions other than malignant neoplasm: Secondary | ICD-10-CM

## 2018-06-09 DIAGNOSIS — Z79899 Other long term (current) drug therapy: Secondary | ICD-10-CM | POA: Insufficient documentation

## 2018-06-09 DIAGNOSIS — R911 Solitary pulmonary nodule: Secondary | ICD-10-CM | POA: Diagnosis not present

## 2018-06-09 DIAGNOSIS — N182 Chronic kidney disease, stage 2 (mild): Secondary | ICD-10-CM | POA: Diagnosis not present

## 2018-06-09 DIAGNOSIS — I2510 Atherosclerotic heart disease of native coronary artery without angina pectoris: Secondary | ICD-10-CM | POA: Diagnosis not present

## 2018-06-09 DIAGNOSIS — J449 Chronic obstructive pulmonary disease, unspecified: Secondary | ICD-10-CM | POA: Diagnosis not present

## 2018-06-09 DIAGNOSIS — Z419 Encounter for procedure for purposes other than remedying health state, unspecified: Secondary | ICD-10-CM

## 2018-06-09 HISTORY — DX: Chronic obstructive pulmonary disease, unspecified: J44.9

## 2018-06-09 HISTORY — DX: Malignant (primary) neoplasm, unspecified: C80.1

## 2018-06-09 HISTORY — PX: VIDEO BRONCHOSCOPY: SHX5072

## 2018-06-09 HISTORY — PX: FUDUCIAL PLACEMENT: SHX5083

## 2018-06-09 HISTORY — PX: VIDEO BRONCHOSCOPY WITH ENDOBRONCHIAL NAVIGATION: SHX6175

## 2018-06-09 LAB — COMPREHENSIVE METABOLIC PANEL
ALT: 16 U/L (ref 0–44)
AST: 17 U/L (ref 15–41)
Albumin: 3.6 g/dL (ref 3.5–5.0)
Alkaline Phosphatase: 60 U/L (ref 38–126)
Anion gap: 10 (ref 5–15)
BUN: 50 mg/dL — ABNORMAL HIGH (ref 8–23)
CO2: 25 mmol/L (ref 22–32)
Calcium: 9.1 mg/dL (ref 8.9–10.3)
Chloride: 107 mmol/L (ref 98–111)
Creatinine, Ser: 2.27 mg/dL — ABNORMAL HIGH (ref 0.61–1.24)
GFR calc Af Amer: 32 mL/min — ABNORMAL LOW (ref >60)
GFR calc non Af Amer: 28 mL/min — ABNORMAL LOW (ref >60)
Glucose, Bld: 154 mg/dL — ABNORMAL HIGH (ref 70–99)
Potassium: 4.6 mmol/L (ref 3.5–5.1)
Sodium: 142 mmol/L (ref 135–145)
Total Bilirubin: 0.4 mg/dL (ref 0.3–1.2)
Total Protein: 6.3 g/dL — ABNORMAL LOW (ref 6.5–8.1)

## 2018-06-09 LAB — GLUCOSE, CAPILLARY
Glucose-Capillary: 146 mg/dL — ABNORMAL HIGH (ref 70–99)
Glucose-Capillary: 152 mg/dL — ABNORMAL HIGH (ref 70–99)

## 2018-06-09 LAB — CBC
HCT: 47.4 % (ref 39.0–52.0)
Hemoglobin: 14.9 g/dL (ref 13.0–17.0)
MCH: 28 pg (ref 26.0–34.0)
MCHC: 31.4 g/dL (ref 30.0–36.0)
MCV: 88.9 fL (ref 78.0–100.0)
Platelets: 188 K/uL (ref 150–400)
RBC: 5.33 MIL/uL (ref 4.22–5.81)
RDW: 13.1 % (ref 11.5–15.5)
WBC: 7.9 K/uL (ref 4.0–10.5)

## 2018-06-09 LAB — APTT: aPTT: 33 seconds (ref 24–36)

## 2018-06-09 LAB — PROTIME-INR
INR: 0.97
Prothrombin Time: 12.8 seconds (ref 11.4–15.2)

## 2018-06-09 SURGERY — BRONCHOSCOPY, VIDEO-ASSISTED
Anesthesia: General

## 2018-06-09 MED ORDER — ONDANSETRON HCL 4 MG/2ML IJ SOLN
INTRAMUSCULAR | Status: AC
  Filled 2018-06-09: qty 2

## 2018-06-09 MED ORDER — EPINEPHRINE PF 1 MG/ML IJ SOLN
INTRAMUSCULAR | Status: DC | PRN
  Administered 2018-06-09: 1 mg

## 2018-06-09 MED ORDER — 0.9 % SODIUM CHLORIDE (POUR BTL) OPTIME
TOPICAL | Status: DC | PRN
  Administered 2018-06-09: 1000 mL

## 2018-06-09 MED ORDER — ROCURONIUM BROMIDE 100 MG/10ML IV SOLN
INTRAVENOUS | Status: DC | PRN
  Administered 2018-06-09 (×2): 20 mg via INTRAVENOUS
  Administered 2018-06-09: 10 mg via INTRAVENOUS
  Administered 2018-06-09: 60 mg via INTRAVENOUS
  Administered 2018-06-09: 10 mg via INTRAVENOUS

## 2018-06-09 MED ORDER — IPRATROPIUM-ALBUTEROL 0.5-2.5 (3) MG/3ML IN SOLN
RESPIRATORY_TRACT | Status: AC
Start: 2018-06-09 — End: 2018-06-09
  Administered 2018-06-09: 11:00:00
  Filled 2018-06-09: qty 3

## 2018-06-09 MED ORDER — LIDOCAINE 2% (20 MG/ML) 5 ML SYRINGE
INTRAMUSCULAR | Status: DC | PRN
  Administered 2018-06-09: 60 mg via INTRAVENOUS

## 2018-06-09 MED ORDER — PROPOFOL 10 MG/ML IV BOLUS
INTRAVENOUS | Status: DC | PRN
  Administered 2018-06-09: 100 mg via INTRAVENOUS
  Administered 2018-06-09: 30 mg via INTRAVENOUS

## 2018-06-09 MED ORDER — FENTANYL CITRATE (PF) 250 MCG/5ML IJ SOLN
INTRAMUSCULAR | Status: AC
  Filled 2018-06-09: qty 5

## 2018-06-09 MED ORDER — SUGAMMADEX SODIUM 200 MG/2ML IV SOLN
INTRAVENOUS | Status: DC | PRN
  Administered 2018-06-09: 400 mg via INTRAVENOUS

## 2018-06-09 MED ORDER — MIDAZOLAM HCL 5 MG/5ML IJ SOLN
INTRAMUSCULAR | Status: DC | PRN
  Administered 2018-06-09: 1 mg via INTRAVENOUS

## 2018-06-09 MED ORDER — SODIUM CHLORIDE 0.9 % IV SOLN
INTRAVENOUS | Status: DC | PRN
  Administered 2018-06-09: 40 ug/min via INTRAVENOUS

## 2018-06-09 MED ORDER — MIDAZOLAM HCL 2 MG/2ML IJ SOLN
INTRAMUSCULAR | Status: AC
  Filled 2018-06-09: qty 2

## 2018-06-09 MED ORDER — PROPOFOL 10 MG/ML IV BOLUS
INTRAVENOUS | Status: AC
  Filled 2018-06-09: qty 20

## 2018-06-09 MED ORDER — PHENYLEPHRINE 40 MCG/ML (10ML) SYRINGE FOR IV PUSH (FOR BLOOD PRESSURE SUPPORT)
PREFILLED_SYRINGE | INTRAVENOUS | Status: DC | PRN
  Administered 2018-06-09 (×3): 40 ug via INTRAVENOUS

## 2018-06-09 MED ORDER — EPHEDRINE SULFATE-NACL 50-0.9 MG/10ML-% IV SOSY
PREFILLED_SYRINGE | INTRAVENOUS | Status: DC | PRN
  Administered 2018-06-09 (×4): 10 mg via INTRAVENOUS

## 2018-06-09 MED ORDER — SUGAMMADEX SODIUM 200 MG/2ML IV SOLN
INTRAVENOUS | Status: AC
  Filled 2018-06-09: qty 2

## 2018-06-09 MED ORDER — LACTATED RINGERS IV SOLN
INTRAVENOUS | Status: DC | PRN
  Administered 2018-06-09 (×2): via INTRAVENOUS

## 2018-06-09 MED ORDER — GLYCOPYRROLATE PF 0.2 MG/ML IJ SOSY
PREFILLED_SYRINGE | INTRAMUSCULAR | Status: DC | PRN
  Administered 2018-06-09: .1 mg via INTRAVENOUS

## 2018-06-09 MED ORDER — EPINEPHRINE PF 1 MG/ML IJ SOLN
INTRAMUSCULAR | Status: AC
  Filled 2018-06-09: qty 1

## 2018-06-09 MED ORDER — ONDANSETRON HCL 4 MG/2ML IJ SOLN: 4.0000 mg | Freq: Once | INTRAMUSCULAR | Status: DC | PRN

## 2018-06-09 MED ORDER — DEXAMETHASONE SODIUM PHOSPHATE 10 MG/ML IJ SOLN
INTRAMUSCULAR | Status: AC
  Filled 2018-06-09: qty 1

## 2018-06-09 MED ORDER — DEXAMETHASONE SODIUM PHOSPHATE 4 MG/ML IJ SOLN
INTRAMUSCULAR | Status: DC | PRN
  Administered 2018-06-09: 8 mg via INTRAVENOUS

## 2018-06-09 MED ORDER — FENTANYL CITRATE (PF) 100 MCG/2ML IJ SOLN
INTRAMUSCULAR | Status: DC | PRN
  Administered 2018-06-09: 50 ug via INTRAVENOUS
  Administered 2018-06-09: 100 ug via INTRAVENOUS

## 2018-06-09 MED ORDER — GLYCOPYRROLATE PF 0.2 MG/ML IJ SOSY
PREFILLED_SYRINGE | INTRAMUSCULAR | Status: AC
  Filled 2018-06-09: qty 1

## 2018-06-09 MED ORDER — PHENYLEPHRINE 40 MCG/ML (10ML) SYRINGE FOR IV PUSH (FOR BLOOD PRESSURE SUPPORT)
PREFILLED_SYRINGE | INTRAVENOUS | Status: AC
  Filled 2018-06-09: qty 10

## 2018-06-09 MED ORDER — ONDANSETRON HCL 4 MG/2ML IJ SOLN
INTRAMUSCULAR | Status: DC | PRN
  Administered 2018-06-09: 4 mg via INTRAVENOUS

## 2018-06-09 MED ORDER — ROCURONIUM BROMIDE 10 MG/ML (PF) SYRINGE
PREFILLED_SYRINGE | INTRAVENOUS | Status: AC
  Filled 2018-06-09: qty 10

## 2018-06-09 MED ORDER — LIDOCAINE 2% (20 MG/ML) 5 ML SYRINGE
INTRAMUSCULAR | Status: AC
  Filled 2018-06-09: qty 5

## 2018-06-09 MED ORDER — FENTANYL CITRATE (PF) 100 MCG/2ML IJ SOLN: 25.0000 ug | INTRAMUSCULAR | Status: DC | PRN

## 2018-06-09 MED ORDER — EPHEDRINE SULFATE 50 MG/ML IJ SOLN
INTRAMUSCULAR | Status: AC
  Filled 2018-06-09: qty 1

## 2018-06-09 MED ORDER — SUGAMMADEX SODIUM 500 MG/5ML IV SOLN
INTRAVENOUS | Status: AC
  Filled 2018-06-09: qty 5

## 2018-06-09 SURGICAL SUPPLY — 39 items
ADAPTER BRONCH F/PENTAX (ADAPTER) ×2 IMPLANT
BRUSH BIOPSY BRONCH 10 SDTNB (MISCELLANEOUS) ×2 IMPLANT
BRUSH CYTOL CELLEBRITY 1.5X140 (MISCELLANEOUS) ×2 IMPLANT
BRUSH SUPERTRAX BIOPSY (INSTRUMENTS) IMPLANT
BRUSH SUPERTRAX NDL-TIP CYTO (INSTRUMENTS) ×4 IMPLANT
CANISTER SUCT 3000ML PPV (MISCELLANEOUS) ×2 IMPLANT
CHANNEL WORK EXTEND EDGE 180 (KITS) IMPLANT
CHANNEL WORK EXTEND EDGE 45 (KITS) IMPLANT
CHANNEL WORK EXTEND EDGE 90 (KITS) ×2 IMPLANT
CONT SPEC 4OZ CLIKSEAL STRL BL (MISCELLANEOUS) ×8 IMPLANT
COVER BACK TABLE 60X90IN (DRAPES) ×2 IMPLANT
FILTER STRAW FLUID ASPIR (MISCELLANEOUS) ×2 IMPLANT
FORCEPS BIOP RJ4 1.8 (CUTTING FORCEPS) ×2 IMPLANT
FORCEPS BIOP SUPERTRX PREMAR (INSTRUMENTS) ×2 IMPLANT
GAUZE SPONGE 4X4 12PLY STRL (GAUZE/BANDAGES/DRESSINGS) ×2 IMPLANT
GLOVE BIO SURGEON STRL SZ 6.5 (GLOVE) ×2 IMPLANT
KIT CLEAN ENDO COMPLIANCE (KITS) ×2 IMPLANT
KIT MARKER FIDUCIAL DELIVERY (KITS) ×2 IMPLANT
KIT PROCEDURE EDGE 180 (KITS) IMPLANT
KIT PROCEDURE EDGE 45 (KITS) IMPLANT
KIT PROCEDURE EDGE 90 (KITS) ×2 IMPLANT
KIT TURNOVER KIT B (KITS) ×2 IMPLANT
MARKER FIDUCIAL SL NIT COIL (Implant Marker) ×12 IMPLANT
MARKER SKIN DUAL TIP RULER LAB (MISCELLANEOUS) ×2 IMPLANT
NEEDLE SUPERTRX PREMARK BIOPSY (NEEDLE) ×4 IMPLANT
NS IRRIG 1000ML POUR BTL (IV SOLUTION) ×2 IMPLANT
OIL SILICONE PENTAX (PARTS (SERVICE/REPAIRS)) ×2 IMPLANT
PAD ARMBOARD 7.5X6 YLW CONV (MISCELLANEOUS) ×4 IMPLANT
PATCHES PATIENT (LABEL) ×6 IMPLANT
SYR 20CC LL (SYRINGE) IMPLANT
SYR 20ML ECCENTRIC (SYRINGE) ×2 IMPLANT
SYR 3ML LL SCALE MARK (SYRINGE) ×2 IMPLANT
TOWEL GREEN STERILE (TOWEL DISPOSABLE) ×2 IMPLANT
TOWEL GREEN STERILE FF (TOWEL DISPOSABLE) ×2 IMPLANT
TRAP SPECIMEN MUCOUS 40CC (MISCELLANEOUS) ×2 IMPLANT
TUBE CONNECTING 20X1/4 (TUBING) ×2 IMPLANT
UNDERPAD 30X30 (UNDERPADS AND DIAPERS) ×2 IMPLANT
VALVE DISPOSABLE (MISCELLANEOUS) ×4 IMPLANT
WATER STERILE IRR 1000ML POUR (IV SOLUTION) ×2 IMPLANT

## 2018-06-09 NOTE — Transfer of Care (Signed)
Immediate Anesthesia Transfer of Care Note  Patient: Travis [name]  Procedure(s) Performed: VIDEO BRONCHOSCOPY (N/A ) VIDEO BRONCHOSCOPY WITH ENDOBRONCHIAL NAVIGATION (N/A ) possible, PLACEMENT OF FUDUCIAL (N/A )  Patient Location: PACU  Anesthesia Type:General  Level of Consciousness: awake, oriented and patient cooperative  Airway & Oxygen Therapy: Patient Spontanous Breathing and Patient connected to face mask oxygen  Post-op Assessment: Report given to RN and Post -op Vital signs reviewed and stable  Post vital signs: Reviewed and stable  Last Vitals:  Vitals Value Taken Time  BP 171/70 06/09/2018 10:34 AM  Temp    Pulse 57 06/09/2018 10:35 AM  Resp 16 06/09/2018 10:35 AM  SpO2 100 % 06/09/2018 10:35 AM  Vitals shown include unvalidated device data.  Last Pain:  Vitals:   06/09/18 0611  TempSrc: Oral         Complications: No apparent anesthesia complications

## 2018-06-09 NOTE — Brief Op Note (Signed)
      [address]       [place] [address]             [phone]      06/09/2018  10:34 AM  PATIENT:  Travis [name]  67 y.o. male  PRE-OPERATIVE DIAGNOSIS:  Pulmonary Nodules bilaterial   POST-OPERATIVE DIAGNOSIS:  Pulmonary Nodules, squamous cell ca right upper lobe   PROCEDURE:  Procedure(s): VIDEO BRONCHOSCOPY (N/A) VIDEO BRONCHOSCOPY WITH ENDOBRONCHIAL NAVIGATION (N/A) possible, PLACEMENT OF FUDUCIAL (N/A)  SURGEON:  Surgeon(s) and Role:    * Grace Isaac, MD - Primary   ASSISTANTS: none   ANESTHESIA:   general  EBL:  None    BLOOD ADMINISTERED:none  DRAINS: none   LOCAL MEDICATIONS USED:  NONE  SPECIMEN:  Source of Specimen:  right and left lower lobe and right upper lobe   DISPOSITION OF SPECIMEN:  PATHOLOGY  COUNTS:  YES  DICTATION: .Dragon Dictation  PLAN OF CARE: Discharge to home after PACU  PATIENT DISPOSITION:  PACU - hemodynamically stable.   Delay start of Pharmacological VTE agent (>24hrs) due to surgical blood loss or risk of bleeding: yes

## 2018-06-09 NOTE — Anesthesia Postprocedure Evaluation (Signed)
Anesthesia Post Note  Patient: Travis [name]  Procedure(s) Performed: VIDEO BRONCHOSCOPY (N/A ) VIDEO BRONCHOSCOPY WITH ENDOBRONCHIAL NAVIGATION (N/A ) possible, PLACEMENT OF FUDUCIAL (N/A )     Patient location during evaluation: PACU Anesthesia Type: General Level of consciousness: awake and alert Pain management: pain level controlled Vital Signs Assessment: post-procedure vital signs reviewed and stable Respiratory status: spontaneous breathing, nonlabored ventilation, respiratory function stable and patient connected to nasal cannula oxygen Cardiovascular status: blood pressure returned to baseline and stable Postop Assessment: no apparent nausea or vomiting Anesthetic complications: no    Last Vitals:  Vitals:   06/09/18 1213 06/09/18 1225  BP:    Pulse:    Resp:    Temp:  (!) 36.3 C  SpO2: 94%     Last Pain:  Vitals:   06/09/18 1225  TempSrc:   PainSc: 0-No pain                 Baylei Siebels P [place] Cordts

## 2018-06-09 NOTE — Progress Notes (Signed)
Pt having expiratory wheezing, O2 sats in high 80's. Called Dr. Roanna Banning, ordered Duoneb and non rebreather mask until O2 sats increase.

## 2018-06-09 NOTE — Discharge Instructions (Signed)
Flexible Bronchoscopy, Care After °This sheet gives you information about how to care for yourself after your procedure. Your health care provider may also give you more specific instructions. If you have problems or questions, contact your health care provider. °What can I expect after the procedure? °After the procedure, it is common to have the following symptoms for 24-48 hours: °· A cough that is worse than it was before the procedure. °· A low-grade fever. °· A sore throat or hoarse voice. °· Small streaks of blood in the mucus from your lungs (sputum), if tissue samples were removed (biopsy). ° °Follow these instructions at home: °Eating and drinking °· Do not eat or drink anything (including water) for 2 hours after your procedure, or until your numbing medicine (local anesthetic) has worn off. Having a numb throat increases your risk of burning yourself or choking. °· After your numbness is gone and your cough and gag reflexes have returned, you may start eating only soft foods and slowly drinking liquids. °· The day after the procedure, return to your normal diet. °Driving °· Do not drive for 24 hours if you were given a medicine to help you relax (sedative). °· Do not drive or use heavy machinery while taking prescription pain medicine. °General instructions °· Take over-the-counter and prescription medicines only as told by your health care provider. °· Return to your normal activities as told by your health care provider. Ask your health care provider what activities are safe for you. °· Do not use any products that contain nicotine or tobacco, such as cigarettes and e-cigarettes. If you need help quitting, ask your health care provider. °· Keep all follow-up visits as told by your health care provider. This is important, especially if you had a biopsy taken. °Get help right away if: °· You have shortness of breath that gets worse. °· You become light-headed or feel like you might faint. °· You have  chest pain. °· You cough up more than a small amount of blood. °· The amount of blood you cough up increases. °Summary °· Common symptoms in the 24-48 hours following a flexible bronchoscopy include cough, low-grade fever, sore throat or hoarse voice, and blood-streaked mucus from the lungs (if you had a biopsy). °· Do not eat or drink anything (including water) for 2 hours after your procedure, or until your local anesthetic has worn off. You can return to your normal diet the day after the procedure. °· Get help right away if you develop worsening shortness of breath, have chest pain, become light-headed, or cough up more than a small amount of blood. °This information is not intended to replace advice given to you by your health care provider. Make sure you discuss any questions you have with your health care provider. °Document Released: 05/17/2005 Document Revised: 11/15/2016 Document Reviewed: 11/15/2016 °Elsevier Interactive Patient Education © 2017 [employer] Inc. ° °

## 2018-06-09 NOTE — H&P (Signed)
[address]       [place] [address]             [phone]                    Travis [name] Medical Record #[id number] Date of Birth: 01-06-1951  Referring: Travis Lathe, PA-C Primary Care: [name], MD  Chief Complaint:    Bilateral Lung Lesions   History of Present Illness:    Travis [name] 67 y.o. male has been followed in the office  for follow-up CT of the chest. The  patient notes that several years ago he began having lung cancer screening CTs  is done through the [place]. A follow-upscan was performed and he was told of lesion on the scan "looked mushy".  PET scan was done through the [place] system and the patient was referred to thoracic surgery.  The patient gives a history of smoking for many years since age 31, up to a pack a day over that time.  Most recently he quit -05/09/2015.   Notes his past history is significant for multiple cardiac stents in 2000. ,  Last cath was approximately 8 years ago.  He has a history of diabetes mellitus and chronic renal insufficiency.  Patient returns today with a follow-up CT scan today to compare to scan done in February 2019    Current Activity/ Functional Status:  Patient is independent with mobility/ambulation, transfers, ADL's, IADL's.   Zubrod Score: At the time of surgery this patient's most appropriate activity status/level should be described as: []     0    Normal activity, no symptoms [x]     1    Restricted in physical strenuous activity but ambulatory, able to do out light work []     2    Ambulatory and capable of self care, unable to do work activities, up and about               >50 % of waking hours                              []     3    Only limited self care, in bed greater than 50% of waking hours []     4    Completely disabled, no self care, confined to bed or chair []     5    Moribund Patient notes that he is limited in ambulation because "his legs lock up".  Past Medical History:    Diagnosis Date  . CAD (coronary artery disease)   . Cancer ([place])    skin cancer face  . COPD (chronic obstructive pulmonary disease) ([place])   . Coronary artery disease    PCA 1994l; PCI august 2003 of an obtust marginal. Catheterization at the time reveal: left main normal; LAD 50-75% between first and second diagonals, 50% after lesion; left circumflex 95% in obtuse marg. RCA 100% proximal.  . Diabetes mellitus    type 2  . Hyperlipemia   . Hypertension   . Kidney disease    chronic; stage 2 followed By Dr Deterding  . Myocardial infarction ([place])   . Osteoarthritis   . Peripheral vascular disease ([place])   . Tobacco abuse     Past Surgical History:  Procedure Laterality Date  . APPENDECTOMY    . CHOLECYSTECTOMY    . CORONARY ANGIOPLASTY  stent times 2, last cath 3 years ago  . NERVE EXPLORATION  07/29/2012   Procedure: NERVE EXPLORATION;  Surgeon: Schuyler Amor, MD;  Location: [facility];  Service: Orthopedics;  Laterality: Left;  Exploration /repair nerve and tendon left thumb  . PERIPHERAL VASCULAR CATHETERIZATION N/A 04/26/2015   Procedure: Abdominal Aortogram;  Surgeon: Rosetta Posner, MD;  Location: [facility];  Service: Cardiovascular;  Laterality: N/A;    Family History  Problem Relation Age of Onset  . Coronary artery disease Mother   . Heart disease Mother   . Cancer Father   . Stroke Other   . Heart failure Other   . Pulmonary embolism Other   . Diabetes Brother   . Hypertension Brother   . Heart attack Brother   . Peripheral vascular disease Brother   . Heart disease Brother   . Hypertension Sister       Social History   Tobacco Use  Smoking Status Former Smoker  . Packs/day: 0.50  . Years: 50.00  . Pack years: 25.00  . Types: Cigarettes  . Last attempt to quit: 05/09/2015  . Years since quitting: 3.0  Smokeless Tobacco Former Systems developer  . Types: Chew  . Quit date: 2016    Social History   Substance and Sexual Activity   Alcohol Use No  . Alcohol/week: 0.0 oz     Allergies  Allergen Reactions  . Penicillins Other (See Comments)    UNSPECIFIED CHILDHOOD REACTION Has patient had PCN reaction causing immediate rash, facial/tongue/throat swelling, SOB or lightheadedness with hypotension: Unknown Has patient had a PCN reaction causing severe rash involving mucus membranes or skin necrosis: Unknown Has patient had a PCN reaction that required hospitalization: No Has patient had a PCN reaction occurring within the last 10 years: No If all of the above answers are "NO", then may proceed with Cephalosporin use.     Current Facility-Administered Medications  Medication Dose Route Frequency Provider Last Rate Last Dose  . 0.9 % irrigation (POUR BTL)    PRN Grace Isaac, MD   1,000 mL at 06/09/18 0717  . EPINEPHrine (ADRENALIN)    PRN Grace Isaac, MD   1 mg at 06/09/18 3810   Facility-Administered Medications Ordered in Other Encounters  Medication Dose Route Frequency Provider Last Rate Last Dose  . lactated ringers infusion   Intravenous Continuous PRN Orlie Dakin, CRNA          Review of Systems:  Review of Systems  Constitutional: Negative.   HENT: Negative.   Eyes: Negative.   Respiratory: Positive for cough. Negative for hemoptysis, sputum production, shortness of breath and wheezing.   Cardiovascular: Negative.   Gastrointestinal: Negative.   Genitourinary: Negative.   Musculoskeletal: Negative.   Skin: Negative.   Neurological: Negative.   Endo/Heme/Allergies: Negative.   Psychiatric/Behavioral: Negative.    Physical Exam: BP (!) 165/65   Pulse (!) 57   Temp 98.4 F (36.9 C) (Oral)   Resp 18   Ht 6' (1.829 m)   Wt 97.5 kg (215 lb)   SpO2 99%   BMI 29.16 kg/m   PHYSICAL EXAMINATION: General appearance: alert, cooperative and no distress Head: Normocephalic, without obvious abnormality, atraumatic Neck: no adenopathy, no carotid bruit, no JVD, supple, symmetrical,  trachea midline and thyroid not enlarged, symmetric, no tenderness/mass/nodules Lymph nodes: Cervical, supraclavicular, and axillary nodes normal. Resp: clear to auscultation bilaterally Back: symmetric, no curvature. ROM normal. No CVA tenderness. Cardio: regular rate and  rhythm, S1, S2 normal, no murmur, click, rub or gallop GI: soft, non-tender; bowel sounds normal; no masses,  no organomegaly Extremities: extremities normal, atraumatic, no cyanosis or edema and Homans sign is negative, no sign of DVT Neurologic: Grossly normal  Diagnostic Studies & Laboratory data:     Recent Radiology Findings:  Ct Super D Chest Wo Contrast  Result Date: 06/04/2018 CLINICAL DATA:  Pulmonary nodule EXAM: CT CHEST WITHOUT CONTRAST TECHNIQUE: Multidetector CT imaging of the chest was performed using thin slice collimation for electromagnetic bronchoscopy planning purposes, without intravenous contrast. COMPARISON:  01/08/2018 FINDINGS: Cardiovascular: The heart size is normal. No substantial pericardial effusion. Coronary artery calcification is evident. Atherosclerotic calcification is noted in the wall of the thoracic aorta. Mediastinum/Nodes: No mediastinal lymphadenopathy. No evidence for gross hilar lymphadenopathy although assessment is limited by the lack of intravenous contrast on today's study. The esophagus has normal imaging features. There is no axillary lymphadenopathy. Lungs/Pleura: Interval progression of posterior right lower lobe bilobed nodule now measuring 16 x 11 mm compared to 10 x 6 mm on the prior study. Multi lobular lesion in the inferior left lower lobe measures 20 x 12 mm today compared to 15 x 10 mm previously. Similar appearance of other scattered very tiny bilateral pulmonary nodules. No focal airspace consolidation. No pulmonary edema or pleural effusion. Upper Abdomen: Unremarkable Musculoskeletal: Bilateral symmetric gynecomastia. No worrisome lytic or sclerotic osseous abnormality.  IMPRESSION: 1. Interval progression of left lower lobe and right lower lobe pulmonary nodules. 2. Scattered tiny bilateral nodules measuring less than 4 mm are stable in the interval. 3. No lymphadenopathy in the thorax. 4.  Aortic Atherosclerois (ICD10-170.0) Electronically Signed   By: Misty Stanley M.D.   On: 06/04/2018 12:57   I have independently reviewed the above radiology studies  and reviewed the findings with the patient.  Ct Super D Chest Wo Contrast  Result Date: 01/08/2018 CLINICAL DATA:  Left lower lobe pulmonary nodule. Preoperative planning. EXAM: CT CHEST WITHOUT CONTRAST TECHNIQUE: Multidetector CT imaging of the chest was performed using thin slice collimation for electromagnetic bronchoscopy planning purposes, without intravenous contrast. COMPARISON:  Outside CT scans and PET-CT. The PET-CT is dated 08/15/2017 and is from [facility] FINDINGS: Cardiovascular: The heart is within normal limits in size. No pericardial effusion. Extensive atherosclerotic calcifications involving the thoracic aorta and branch vessels including dense three-vessel coronary artery calcifications. No focal aneurysm. Mediastinum/Nodes: Small scattered sub 8 mm mediastinal and hilar lymph nodes. No mass or adenopathy. The esophagus is grossly normal. Lungs/Pleura: 15 x 10 mm left lower lobe pulmonary lesion noted on image number 102. Several small sub 4 mm nodules are noted in both lungs. These appears stable. 9.5 x 5.5 mm bilobed nodular density in the right lower lobe on image number 90 associated with a slightly dilated bronchus just proximally. This is new since the prior CT scan and could represent mucoid impaction or endobronchial lesion. Stable underlying emphysematous changes. Upper Abdomen: No significant upper abdominal findings. There is a stable left adrenal gland adenoma. No findings suspicious for hepatic metastatic disease. Advanced atherosclerotic calcifications involving the upper abdominal aorta and  visualized branch vessels. Musculoskeletal: No chest wall mass, supraclavicular or axillary lymphadenopathy. The thyroid gland is grossly normal. Mild bilateral symmetric gynecomastia. No significant bony findings. IMPRESSION: 1. 15 x 10 mm left lower lobe pulmonary lesion appears relatively stable in size when compared to the prior outside PET-CT. 2. New bilobed lesion in the right lower lobe just distal to a slightly dilated bronchus.  This could be mucoid impaction or small endobronchial lesion. Recommend continued surveillance. 3. Several small, sub 4 mm, pulmonary nodules in both lungs. 4. No mediastinal or hilar mass or adenopathy. 5. Stable emphysematous changes and pulmonary scarring. 6. Significant atherosclerotic calcifications involving the thoracic and abdominal aorta and branch vessels including dense and extensive three-vessel coronary artery calcifications. Aortic Atherosclerosis (ICD10-I70.0) and Emphysema (ICD10-J43.9). Electronically Signed   By: Marijo Sanes M.D.   On: 01/08/2018 15:25  I have independently reviewed the above radiology studies  and reviewed the findings with the patient.   Description:  CT OF THE CHEST, LUNG CANCER SCREENING   HISTORY: lung cancer screening   COMPARISON: 08/05/2016   TECHNIQUE: CT images of the chest were obtained without intravenous  contrast utilizing low dose technique.   FINDINGS:   Lung nodules, measured on series 2: Left lower lobe nodular opacity is  similar in measured size but increased in soft tissue bulk, measuring 17 x  21 mm image 171, and there is a new nodular component contacting the  inferior pleura measuring 9 x 11 mm image 173, also seen on coronal series  603 image 84. New 7 mm (mean diameter) cavitary nodule right lower lobe  series 2 image 141. Otherwise similar innumerable nodules including 4 mm  left upper lobe image 58, 4 mm left upper lobe image 78, 5 mm subpleural  superior left upper lobe image 69.   Airways:  Diffuse bronchial wall thickening suggesting chronic bronchitis.   COPD: Mild upper lobe predominant emphysema.   Additional lung findings: Interval development of partial collapse  anterior segment right upper lobe.   Pleura:No pleural effusion.   Heart/Coronary Arteries: Heart size within normal limits. Heavy  atherosclerotic coronary artery calcifications. Coronary stents present.   Mediastinum/Hila/Axilla: No lymphadenopathy.   Other findings: Bilateral gynecomastia. Cholecystectomy   ______________________     Impression:    1. Enlarging left lower lobe nodular opacity with new adjacent 10 mm  (mean diameter) nodule contacting the inferior pleura. 2. New 7 mm mean  diameter cavitary nodule right lower lobe. 3. Otherwise stable nodules  as above.   LUNG-RADS CATEGORY:   4 - Suspicious.   RECOMMENDATION: Thoracic surgery referral.    Electronically Signed By: Minette Brine Electronically Signed On:  08/07/2017 2:45 PM     Diagnosis:  1214 LUNGRADS 4A: SUSPICIOUS NODULE      Description:  Procedure: Following at least four-hour fasting, the patient's blood  glucose was 121 mg/dL. One hour following the injection of 12.315 mCi of  F-18-FDG, low dose CT images were obtained from the orbital meatal line  through the mid thigh. Then, PET images were obtained through the same  region. Attenuation corrected images were constructed using the CT scan.  Fused images of PET and CT were reviewed. The standard uptake values (SUV)  reported below are maximum values within a region of interest, expressed  in gm/ml.   Comparison: Multiple prior CTs   Findings: Evaluation of the brain demonstrates no abnormality in the  radiotracer distribution of the cerebrum or cerebellum. Atherosclerotic  calcifications are seen in the vertebrobasilar and internal carotid  arteries. Mucosal thickening is seen in the right ethmoid air cells.   Evaluation of the neck demonstrates  physiologic uptake within the salivary  glands and remaining cervical structures. No foci of hypermetabolic  activity are identified within cervical lymph nodes. Atherosclerotic  calcifications are seen within the carotid arterial tree.   Evaluation of the thorax demonstrates physiologic myocardial  activity.  Atherosclerotic calcifications are seen in the thoracic aorta and branch  vessels to include severe coronary artery calcifications. Focal  collapse/consolidation is again seen in the anterior right upper lobe as  was noted on the most recent prior chest CT examination. Mild  hypermetabolic activity is seen in the branching nodular opacity in the  posteromedial left lower lobe (SUV max 2.3), nonspecific. A small  peribronchovascular nodule in the posterolateral right lower lobe which  was new on the most recent prior study demonstrates minimal nonspecific  hypermetabolic activity. Additional small nodules are present which are  below size resolution for the PET portion of this examination. There are  no hypermetabolic mediastinal or hilar lymph nodes.   Examination of the abdomen and pelvis shows physiologic uptake within the  adrenal glands, liver, spleen, kidneys, ureters and bladder. The pancreas  is unremarkable. Status post cholecystectomy. There are no hypermetabolic  lymph nodes. Atherosclerotic calcifications are seen throughout the  abdominal aorta and branch vessels. No AAA. A few colonic diverticula are  noted. There is no evidence of bowel obstruction. Diffuse hypermetabolic  activity is seen within the stomach which is greater than expected for  physiologic activity. A small lipoma is seen in the right lateral thigh  musculature.   No worrisome hypermetabolic osseous lesions are noted. Degenerative  changes are noted with mild expected hypermetabolic activity.     Impression:  1. Focal collapse/consolidation is again seen in the anterior right upper  lobe as was  noted on the recent prior chest CT examination. Small  peribronchovascular nodule in the posterolateral right lower lobe which  was new on the most recent prior study demonstrates minimal nonspecific  activity. Additional small nodules are present which are below size  resolution for the PET portion of this examination. Clinical correlation  for signs/symptoms of infection and appropriate treatment are recommended.   2. Mild nonspecific hypermetabolic activity is seen in the branching  nodular opacity in the posteromedial left lower lobe. Continued attention  on follow-up low dose CT is recommended.   3. Diffuse hypermetabolic activity within the stomach which is greater  than expected for physiologic activity. Clinical correlation and  correlation with endoscopic findings is recommended.   Electronically Signed By: Ishmael Holter Electronically Signed  On: 08/15/2017 2:37 PM      I have independently reviewed the above radiologic studies.  Recent Lab Findings: Lab Results  Component Value Date   WBC 9.9 03/05/2013   HGB 15.3 04/26/2015   HCT 45.0 04/26/2015   PLT 170 03/05/2013   GLUCOSE 103 (H) 04/26/2015   CHOL  10/28/2009    80        ATP III CLASSIFICATION:  <200     mg/dL   Desirable  200-239  mg/dL   Borderline High  >=240    mg/dL   High          TRIG 131 10/28/2009   HDL 29 (L) 10/28/2009   LDLCALC  10/28/2009    25        Total Cholesterol/HDL:CHD Risk Coronary Heart Disease Risk Table                     Men   Women  1/2 Average Risk   3.4   3.3  Average Risk       5.0   4.4  2 X Average Risk   9.6   7.1  3 X Average Risk  23.4   11.0  Use the calculated Patient Ratio above and the CHD Risk Table to determine the patient's CHD Risk.        ATP III CLASSIFICATION (LDL):  <100     mg/dL   Optimal  100-129  mg/dL   Near or Above                    Optimal  130-159  mg/dL   Borderline  160-189  mg/dL   High  >190     mg/dL   Very High     NA 139 04/26/2015   K 4.7 04/26/2015   CL 107 04/26/2015   CREATININE 2.30 (H) 04/26/2015   BUN 62 (H) 04/26/2015   CO2 26 03/05/2013   TSH 1.330 Test methodology is 3rd generation TSH 10/26/2009   INR 1.01 10/26/2009   HGBA1C (H) 10/26/2009    6.7 (NOTE) The ADA recommends the following therapeutic goal for glycemic control related to Hgb A1c measurement: Goal of therapy: <6.5 Hgb A1c  Reference: American Diabetes Association: Clinical Practice Recommendations 2010, Diabetes Care, 2010, 33: (Suppl  1).   Chronic Kidney Disease   Stage I     GFR >90  Stage II    GFR 60-89  Stage IIIA GFR 45-59  Stage IIIB GFR 30-44  Stage IV   GFR 15-29  Stage V    GFR  <15  Lab Results  Component Value Date   CREATININE 2.30 (H) 04/26/2015   CrCl cannot be calculated (Patient's most recent lab result is older than the maximum 21 days allowed.).  FEV1 1.83   51% 11% improvement with bronchdilators  DLCO 18.5  51% Pulmonary Function Diagnosis: Severe Obstructive Airways Disease Insignificant response to bronchodilator Moderate Restriction -Parenchymal Moderately severe Diffusion Defect  Assessment / Plan:   1/Branching nodular opacity in the posteromedial left lower lobe-nonspecific finding on PET scan,  2/Interval progression of left lower lobe and right lower lobe pulmonary nodules.  3/Chronic renal insufficiency most recent creatinine in the system 2  years ago 4/long-term smoking history and symptoms suggestive of significant COPD-formal pulmonary function studies will be obtained 5/known significant coronary occlusive disease with previous stents placed currently on Plavix-checked with cardiology and is okay to temporarily hold his Plavix.  Because of easy bruising he had stopped his aspirin previously.  With CT documented interval progression of both left lower lobe and right lower lobe pulmonary nodules I recommend to the patient that we next proceeded with navigation bronchoscopy  and attempt to biopsy both the right and left lower lobe lesions.  I discussed with he and his wife the risk of false negative results, but with bilateral disease obtaining a tissue diagnosis of both lesions is necessary.  Risks and options of the procedure discussed with the patient in detail and he is willing to proceed.  We will plan for navigational bronchoscopy with biopsy of right and left lung lesions and placement of fiducial markers. The goals risks and alternatives of the planned surgical procedure Procedure(s): VIDEO BRONCHOSCOPY (N/A) VIDEO BRONCHOSCOPY WITH ENDOBRONCHIAL NAVIGATION (N/A) possible, PLACEMENT OF FUDUCIAL (N/A)  have been discussed with the patient in detail. The risks of the procedure including death, infection, stroke, myocardial infarction, bleeding, blood transfusion , pneumothorax have all been discussed specifically.  I have quoted Odis Luster a 1 % of perioperative mortality and a complication rate as high as 10%. The patient's questions have been answered.JOSEDE CICERO is willing  to proceed with the planned procedure.  Grace Isaac MD      [address] [place] [address] Office [phone]   Beeper [phone]  06/09/2018 7:19 AM

## 2018-06-09 NOTE — Anesthesia Procedure Notes (Signed)
Procedure Name: Intubation Date/Time: 06/09/2018 7:44 AM Performed by: Orlie Dakin, CRNA Pre-anesthesia Checklist: Patient identified, Emergency Drugs available, Suction available, Patient being monitored and Timeout performed Patient Re-evaluated:Patient Re-evaluated prior to induction Oxygen Delivery Method: Circle system utilized Preoxygenation: Pre-oxygenation with 100% oxygen Induction Type: IV induction Ventilation: Mask ventilation without difficulty Laryngoscope Size: Glidescope and 4 Grade View: Grade I Tube type: Oral Tube size: 9.0 mm Number of attempts: 2 Airway Equipment and Method: Stylet and Video-laryngoscopy Placement Confirmation: ETT inserted through vocal cords under direct vision,  positive ETCO2 and breath sounds checked- equal and bilateral Secured at: 24 cm Tube secured with: Tape Dental Injury: Teeth and Oropharynx as per pre-operative assessment  Comments: Noted recessed chin, short neck in Short-Stay.  1st DL miller 3, grade 4, only epiglottis seen.  2nd DL, Glidescope 4 used, grade 1. 4x4s bite block used for end of case.

## 2018-06-09 NOTE — Anesthesia Preprocedure Evaluation (Addendum)
Anesthesia Evaluation  Patient identified by MRN, date of birth, ID band Patient awake    Reviewed: Allergy & Precautions, NPO status , Patient's Chart, lab work & pertinent test results, reviewed documented beta blocker date and time   Airway Mallampati: III  TM Distance: >3 FB Neck ROM: Full    Dental no notable dental hx. (+) Dental Advisory Given, Caps, Teeth Intact   Pulmonary COPD, former smoker,    Pulmonary exam normal breath sounds clear to auscultation       Cardiovascular hypertension, Pt. on medications and Pt. on home beta blockers + CAD, + Past MI, + Cardiac Stents (x 2 ) and + Peripheral Vascular Disease  Normal cardiovascular exam Rhythm:Regular Rate:Normal  ECG: SB, rate 59  Sees cardiologist Glennie Hawk)   Neuro/Psych negative neurological ROS  negative psych ROS   GI/Hepatic negative GI ROS, Neg liver ROS,   Endo/Other  diabetes, Insulin Dependent  Renal/GU Renal InsufficiencyRenal disease     Musculoskeletal negative musculoskeletal ROS (+)   Abdominal   Peds  Hematology HLD   Anesthesia Other Findings Pulmonary Nodules  Reproductive/Obstetrics                           Anesthesia Physical Anesthesia Plan  ASA: III  Anesthesia Plan: General   Post-op Pain Management:    Induction: Intravenous  PONV Risk Score and Plan: 2 and Midazolam, Dexamethasone, Ondansetron and Treatment may vary due to age or medical condition  Airway Management Planned: Oral ETT  Additional Equipment:   Intra-op Plan:   Post-operative Plan: Extubation in OR  Informed Consent: I have reviewed the patients History and Physical, chart, labs and discussed the procedure including the risks, benefits and alternatives for the proposed anesthesia with the patient or authorized representative who has indicated his/her understanding and acceptance.   Dental advisory given  Plan Discussed with:  CRNA  Anesthesia Plan Comments:         Anesthesia Quick Evaluation

## 2018-06-10 ENCOUNTER — Encounter (HOSPITAL_COMMUNITY): Payer: Self-pay | Admitting: Cardiothoracic Surgery

## 2018-06-10 NOTE — Op Note (Signed)
NAME: [name], [name] MEDICAL RECORD OF:[id number] ACCOUNT [id number] DATE OF BIRTH:February 26, 1951 FACILITY: [facility] LOCATION: [place], [place]  OPERATIVE REPORT  DATE OF PROCEDURE:  06/09/2018  PREOPERATIVE DIAGNOSIS:  Right and left lower lobe lung nodules.  POSTOPERATIVE DIAGNOSIS:  Right and left lower lobe lung nodules.  Positive biopsy for squamous cell carcinoma of the lung, right upper lobe bronchus.  PROCEDURE PERFORMED:  Bronchoscopy with navigation bronchoscopy to the right lower lobe lesion and left lower lobe lesion with bilateral lung biopsies and placement of fiducial markers, right lower lobe and left lower lobe.  SURGEON:  Lanelle Bal, MD  BRIEF HISTORY:  The patient is a 67 year old male with long-term history of smoking.  He has been followed in a lung cancer screening program at the [place].  The patient had been seen 6 months previously with question of small bilateral pulmonary nodules.  A  followup CT scan showed both the right and left lower lobe lung nodules had increased slightly in size.  Both of the nodules had been present on previous scans but did enlarge with the enlargement on the 2 scans.  We recommended to the patient that we  proceed with attempted biopsy of the lesions bilaterally.  The patient has limited pulmonary reserve on PFTs.  We also discussed placement of fiducial markers in case the lesions were amenable to and needed radiation therapy.  The risks and options were  discussed with the patient in detail, and he was agreeable and signed informed consent.  DESCRIPTION OF PROCEDURE:  The patient underwent general endotracheal anesthesia without incident.  Appropriate timeout was performed, and we proceeded with video bronchoscopy to the subsegmental level, both the right and left tracheobronchial tree.   With examination of the right upper lobe takeoff, there was some area of mucosal irregularities at this level.  There were no definite  endobronchial lesions.  Because of the abnormal-appearing mucosa brushings of the right upper lobe takeoff, biopsies  were done and sent to pathologist for frozen section and quick smear evaluation.  As we were waiting for these results, we proceeded with initially navigation bronchoscopy to the left lower lobe.  Preoperatively, a plan based on the patient's most recent  CT scan had been developed.  With appropriate Super D sensors, a calibration and registration of the plan was done marking each of the lobes of the lung.  With this carried out, we then used our preoperatively developed plan to navigate to the mass in  the left lower lobe.  As we approached the mass with the working channel and probed, we then switched to further calibrating our plan with fluoroscopy through the Super D system.  With this, we were able to obtain good approximation of the lesion both  radiographically and with the Super D sensors.  With the area well calibrated, we then proceeded with multiple biopsies of the area, first with a needle brush, with a triple brush, needle aspiration, and biopsy forceps were done in a series, confirming  the location of our working channel frequently.  Fluoroscopy was also used.  Based on the suggested placement of fiducial markers by the Super D system, 3 small gold seeds were placed in the left lower lobe for preop pre-radiation treatment should it be  necessary.  We then proceeded to the right lower lobe, and in a similar fashion navigated to the right lower lobe lesion.  This lesion was slightly more well circumscribed.  We were easily able to navigate to less  than 1 cm.  A second registration with  fluoroscopy was performed through the system, and similar to the left lower lobe, we obtained multiple biopsies, brushings, and needle aspirations of the lesion.  Specimens were sent to pathology for examination.  Somewhat to the left side, the Super D  system was used to suggest placement of 3  fiducial markers in the right lower lobe.  This was carried out with fluoroscopic control.  At the completion, the lungs were screened with fluoroscopy to ensure there was no evidence of pneumothorax.  The  tracheobronchial tree was suctioned of all secretions.  The scopes were removed.  The patient was awakened and extubated in the operating room, having tolerated the procedure without obvious complication.  The initial smear results of the biopsies  confirmed squamous cell carcinoma involving the takeoff of the right upper lobe but without any endobronchial lesion.  The left lower and the right lower lesions were benign lung tissue  on examination preliminarily.  The patient was transferred to the  recovery room for further postoperative care.  He tolerated the procedure without obvious complication.  LN/NUANCE  D:06/10/2018 T:06/10/2018 JOB:[id number]

## 2018-06-11 ENCOUNTER — Ambulatory Visit: Payer: Medicare Other | Admitting: Cardiothoracic Surgery

## 2018-06-11 ENCOUNTER — Other Ambulatory Visit: Payer: Self-pay

## 2018-06-11 ENCOUNTER — Ambulatory Visit: Payer: Medicare Other | Attending: Cardiothoracic Surgery | Admitting: Rehabilitation

## 2018-06-11 ENCOUNTER — Ambulatory Visit
Admission: RE | Admit: 2018-06-11 | Discharge: 2018-06-11 | Disposition: A | Discharge status: Home / Self Care | Payer: Medicare Other | Source: Ambulatory Visit | Attending: Radiation Oncology | Admitting: Radiation Oncology

## 2018-06-11 ENCOUNTER — Ambulatory Visit (INDEPENDENT_AMBULATORY_CARE_PROVIDER_SITE_OTHER): Payer: Medicare Other | Admitting: Cardiothoracic Surgery

## 2018-06-11 ENCOUNTER — Other Ambulatory Visit: Payer: Self-pay | Admitting: *Deleted

## 2018-06-11 ENCOUNTER — Encounter: Payer: Self-pay | Admitting: Rehabilitation

## 2018-06-11 VITALS — BP 167/81 | HR 61 | Temp 97.6°F | Resp 18 | Wt 219.5 lb

## 2018-06-11 DIAGNOSIS — Z0000 Encounter for general adult medical examination without abnormal findings: Secondary | ICD-10-CM | POA: Diagnosis not present

## 2018-06-11 DIAGNOSIS — R293 Abnormal posture: Secondary | ICD-10-CM | POA: Diagnosis not present

## 2018-06-11 DIAGNOSIS — Z87891 Personal history of nicotine dependence: Secondary | ICD-10-CM | POA: Diagnosis not present

## 2018-06-11 DIAGNOSIS — C3490 Malignant neoplasm of unspecified part of unspecified bronchus or lung: Secondary | ICD-10-CM | POA: Insufficient documentation

## 2018-06-11 DIAGNOSIS — C3481 Malignant neoplasm of overlapping sites of right bronchus and lung: Secondary | ICD-10-CM

## 2018-06-11 DIAGNOSIS — R918 Other nonspecific abnormal finding of lung field: Secondary | ICD-10-CM

## 2018-06-11 DIAGNOSIS — C3411 Malignant neoplasm of upper lobe, right bronchus or lung: Secondary | ICD-10-CM | POA: Diagnosis not present

## 2018-06-11 DIAGNOSIS — C3491 Malignant neoplasm of unspecified part of right bronchus or lung: Secondary | ICD-10-CM

## 2018-06-11 DIAGNOSIS — C3492 Malignant neoplasm of unspecified part of left bronchus or lung: Principal | ICD-10-CM

## 2018-06-11 NOTE — Patient Instructions (Signed)
Given the following handouts: Tips for energy conservation, walking program, staying active during cancer treatments, breathing and postural exercise handouts with instruction, and "why exercise"  How to get in touch with cancer Rehab in the future if needed

## 2018-06-11 NOTE — Therapy (Signed)
[name], [place], [address] Phone: [phone]   Fax:  [phone]  Physical Therapy Evaluation  Patient Details  Name: Travis [name] MRN: [id number] Date of Birth: 1951-06-13 Referring Provider: Dr. Servando [name]   Encounter Date: 06/11/2018  PT End of Session - 06/11/18 1630    Visit Number  1    Number of Visits  1    PT Start Time  1600    PT Stop Time  1623    PT Time Calculation (min)  23 min    Activity Tolerance  Patient tolerated treatment well    Behavior During Therapy  [name] for tasks assessed/performed       Past Medical History:  Diagnosis Date  . CAD (coronary artery disease)   . Cancer ([name])    skin cancer face  . COPD (chronic obstructive pulmonary disease) ([name])   . Coronary artery disease    PCA 1994l; PCI august 2003 of an obtust marginal. Catheterization at the time reveal: left main normal; LAD 50-75% between first and second diagonals, 50% after lesion; left circumflex 95% in obtuse marg. RCA 100% proximal.  . Diabetes mellitus    type 2  . Hyperlipemia   . Hypertension   . Kidney disease    chronic; stage 2 followed By [name]  . Myocardial infarction ([name])   . Osteoarthritis   . Peripheral vascular disease ([name])   . Tobacco abuse     Past Surgical History:  Procedure Laterality Date  . APPENDECTOMY    . CHOLECYSTECTOMY    . CORONARY ANGIOPLASTY     stent times 2, last cath 3 years ago  . FUDUCIAL PLACEMENT N/A 06/09/2018   Procedure: possible, PLACEMENT OF FUDUCIAL;  Surgeon: [facility] Isaac, MD;  Location: [name];  Service: Thoracic;  Laterality: N/A;  . NERVE EXPLORATION  07/29/2012   Procedure: NERVE EXPLORATION;  Surgeon: Schuyler Amor, MD;  Location: [name];  Service: Orthopedics;  Laterality: Left;  Exploration /repair nerve and tendon left thumb  . PERIPHERAL VASCULAR CATHETERIZATION N/A 04/26/2015   Procedure: Abdominal Aortogram;   Surgeon: Rosetta Posner, MD;  Location: [name];  Service: Cardiovascular;  Laterality: N/A;  . VIDEO BRONCHOSCOPY N/A 06/09/2018   Procedure: VIDEO BRONCHOSCOPY;  Surgeon: [facility] Isaac, MD;  Location: [facility];  Service: Thoracic;  Laterality: N/A;  . VIDEO BRONCHOSCOPY WITH ENDOBRONCHIAL NAVIGATION N/A 06/09/2018   Procedure: VIDEO BRONCHOSCOPY WITH ENDOBRONCHIAL NAVIGATION;  Surgeon: [facility] Isaac, MD;  Location: [facility];  Service: Thoracic;  Laterality: N/A;    There were no vitals filed for this visit.   Subjective Assessment - 06/11/18 1623    Subjective  Pt is very active    Patient is accompained by:  Family member    Patient Stated Goals  learn from all [place] providers    Currently in Pain?  No/denies         [name] PT Assessment - 06/11/18 0001      Assessment   Medical Diagnosis  Bil squamous cell carcinoma lungs    Referring Provider  [facility]rav[facility]. Servando [name]    Prior Therapy  no      Precautions   Precautions  None      Restrictions   Weight Bearing Restrictions  No      Balance Screen   Has the patient fallen in the past 6 months  No    Has the patient had a decrease  in activity level because of a fear of falling?   No    Is the patient reluctant to leave their home because of a fear of falling?   No      Home Social worker  Private residence    Living Arrangements  Spouse/significant other    Available Help at Discharge  Family    Additional Comments  reports no home mobility difficulties      Prior Function   Level of Independence  Independent      Cognition   Overall Cognitive Status  Within Functional Limits for tasks assessed      Coordination   Gross Motor Movements are Fluid and Coordinated  Yes      Functional Tests   Functional tests  Sit to Stand      Sit to Stand   Comments  sit to stand x 30 seconds without hands x 15      Posture/Postural Control   Posture/Postural Control  Postural limitations    Postural  Limitations  Rounded Shoulders;Forward head    Posture Comments  accessory breathing during breath test      ROM / Strength   AROM / PROM / Strength  AROM;Strength      AROM   Overall AROM Comments  trunk ROM WNL      Strength   Overall Strength  Within functional limits for tasks performed                Objective measurements completed on examination: See above findings.              PT Education - 06/11/18 1629    Education Details  importance of continuing exercise and staying active, breathing education    Person(s) Educated  Patient    Methods  Explanation    Comprehension  Verbalized understanding;Returned demonstration            Lung Clinic Goals - 06/11/18 1634      Patient will be able to verbalize understanding of the benefit of exercise to decrease fatigue.   Time  1    Period  Weeks    Status  Achieved      Patient will be able to verbalize the importance of posture.   Time  1    Period  Weeks    Status  Achieved      Patient will be able to demonstrate diaphragmatic breathing for improved lung function.   Time  1    Period  Weeks    Status  Achieved      Patient will be able to verbalize understanding of the role of physical therapy to prevent functional decline and who to contact if physical therapy is needed.   Time  1    Period  Weeks    Status  Achieved           Plan - 06/11/18 1631    Clinical Impression Statement  [name] is a pleasant gentleman who presents at [name] with a new cancer diagnosis.  He is currently active at the senior center walking 22min 3x per week and bowling 3x per week with his wife.  He has no SOB and no mobility limitations currently     Clinical Presentation  Evolving    Clinical Presentation due to:  new diagnosis    Clinical Decision Making  Low    PT Frequency  One time visit    PT Treatment/Interventions  Therapeutic activities  PT Next Visit Plan  follow up as needed    Consulted  and Agree with Plan of Care  Patient       Patient will benefit from skilled therapeutic intervention in order to improve the following deficits and impairments:     Visit Diagnosis: Squamous cell carcinoma of lung, unspecified laterality ([name])  Abnormal posture     Problem List Patient Active Problem List   Diagnosis Date Noted  . Flexor tendon laceration, finger, open wound 07/29/2012  . PVD 12/08/2009  . DIAB W/RENAL MANIFESTS TYPE I [JUV] NOT UNCNTRL 09/08/2009  . HYPERCHOLESTEROLEMIA 09/08/2009  . TOBACCO ABUSE 09/08/2009  . ESSENTIAL HYPERTENSION, BENIGN 09/08/2009  . CORONARY ATHEROSCLEROSIS NATIVE CORONARY ARTERY 09/08/2009  . CHRONIC KIDNEY DISEASE STAGE II (MILD) 09/08/2009    Shan Levans, PT 06/11/2018, 4:35 PM  [place], [place], [address] Phone: [phone]   Fax:  [phone]  Name: [name] MRN: [id number] Date of Birth: [birth year]

## 2018-06-11 NOTE — Progress Notes (Signed)
Radiation Oncology         [phone] ________________________________ Multidisciplinary Thoracic Oncology Clinic [facility]) Initial Outpatient Consultation  Name: Travis [name] MRN: [id number]  Date of Service: 06/11/2018 DOB: 01-Oct-1951  IW:OEHOZY, Elta Guadeloupe, MD  Grace Isaac, MD   REFERRING PHYSICIAN: Grace Isaac, MD  DIAGNOSIS: The encounter diagnosis was Squamous cell carcinoma of lungs, bilateral ([place]).    ICD-10-CM   1. Squamous cell carcinoma of lungs, bilateral (HCC) C34.91    C34.92     HISTORY OF PRESENT ILLNESS: Travis [name] is a 67 y.o. male seen at the request of Dr. Servando Snare. Patient has previously been followed at the [facility] for lung cancer screening with nown bilateral pulmonary nodules. Recently, he was reffered to Dr. Servando Snare for further evaluation of progressively enlarging pulmonary nodules on PET scan.  On 06/04/18, a follow up Chest CT showed interval progression of a posterior right lower lobe bilobed nodule, now measuring 16 x 11 mm compared to 10 x 6 mm on the prior study. Additionally, there was a multi lobular lesion in the inferior left lower lobe measuring 20 x 12 mm as compared to 15 x 10 mm previously and scattered, tiny pulmonary nodules. No lymphadenopathy was appreciated.   He proceeded with navigational bronchoscopy and placement of fiducial markers with Dr. Servando Snare on 06/09/18.  He had bipsy of the RLL, LLL and a RUL/endobrinchial lesion at time of procedure.  The RUL endobronchial lesion was positive for Squamous Cell Carcinoma while the RLL and LLL were nondiagnostic.  The patient was referred today for presentation in the multidisciplinary thoracic oncology conference. Radiology studies and pathology slides were presented there for review and discussion of treatment options. A consensus was discussed regarding potential next steps. Patient expressed that he does bowling with his wife three times a week.   PREVIOUS RADIATION THERAPY: No  PAST MEDICAL  HISTORY:  Past Medical History:  Diagnosis Date  . CAD (coronary artery disease)   . Cancer ([place])    skin cancer face  . COPD (chronic obstructive pulmonary disease) ([place])   . Coronary artery disease    PCA 1994l; PCI august 2003 of an obtust marginal. Catheterization at the time reveal: left main normal; LAD 50-75% between first and second diagonals, 50% after lesion; left circumflex 95% in obtuse marg. RCA 100% proximal.  . Diabetes mellitus    type 2  . Hyperlipemia   . Hypertension   . Kidney disease    chronic; stage 2 followed By Dr Deterding  . Myocardial infarction ([place])   . Osteoarthritis   . Peripheral vascular disease ([place])   . Tobacco abuse       PAST SURGICAL HISTORY: Past Surgical History:  Procedure Laterality Date  . APPENDECTOMY    . CHOLECYSTECTOMY    . CORONARY ANGIOPLASTY     stent times 2, last cath 3 years ago  . FUDUCIAL PLACEMENT N/A 06/09/2018   Procedure: possible, PLACEMENT OF FUDUCIAL;  Surgeon: Grace Isaac, MD;  Location: [place];  Service: Thoracic;  Laterality: N/A;  . NERVE EXPLORATION  07/29/2012   Procedure: NERVE EXPLORATION;  Surgeon: Schuyler Amor, MD;  Location: [place];  Service: Orthopedics;  Laterality: Left;  Exploration /repair nerve and tendon left thumb  . PERIPHERAL VASCULAR CATHETERIZATION N/A 04/26/2015   Procedure: Abdominal Aortogram;  Surgeon: Rosetta Posner, MD;  Location: [place] [facility];  Service: Cardiovascular;  Laterality: N/A;  . VIDEO BRONCHOSCOPY N/A 06/09/2018   Procedure:  VIDEO BRONCHOSCOPY;  Surgeon: Grace Isaac, MD;  Location: [facility] OR;  Service: Thoracic;  Laterality: N/A;  . VIDEO BRONCHOSCOPY WITH ENDOBRONCHIAL NAVIGATION N/A 06/09/2018   Procedure: VIDEO BRONCHOSCOPY WITH ENDOBRONCHIAL NAVIGATION;  Surgeon: Grace Isaac, MD;  Location: [facility] OR;  Service: Thoracic;  Laterality: N/A;    FAMILY HISTORY:  Family History  Problem Relation Age of Onset  . Coronary artery disease  Mother   . Heart disease Mother   . Cancer Father   . Stroke Other   . Heart failure Other   . Pulmonary embolism Other   . Diabetes Brother   . Hypertension Brother   . Heart attack Brother   . Peripheral vascular disease Brother   . Heart disease Brother   . Hypertension Sister     SOCIAL HISTORY:  Social History   Socioeconomic History  . Marital status: Married    Spouse name: Not on file  . Number of children: 3  . Years of education: Not on file  . Highest education level: Not on file  Occupational History  . Not on file  Social Needs  . Financial resource strain: Not on file  . Food insecurity:    Worry: Not on file    Inability: Not on file  . Transportation needs:    Medical: Not on file    Non-medical: Not on file  Tobacco Use  . Smoking status: Former Smoker    Packs/day: 0.50    Years: 50.00    Pack years: 25.00    Types: Cigarettes    Last attempt to quit: 05/09/2015    Years since quitting: 3.1  . Smokeless tobacco: Former Systems developer    Types: [place] date: 2016  Substance and Sexual Activity  . Alcohol use: No    Alcohol/week: 0.0 standard drinks  . Drug use: No  . Sexual activity: Not on file  Lifestyle  . Physical activity:    Days per week: Not on file    Minutes per session: Not on file  . Stress: Not on file  Relationships  . Social connections:    Talks on phone: Not on file    Gets together: Not on file    Attends religious service: Not on file    Active member of club or organization: Not on file    Attends meetings of clubs or organizations: Not on file    Relationship status: Not on file  . Intimate partner violence:    Fear of current or ex partner: Not on file    Emotionally abused: Not on file    Physically abused: Not on file    Forced sexual activity: Not on file  Other Topics Concern  . Not on file  Social History Narrative   Has a 40-pack-year smoking history and currently smokes 1/2 pack per day.    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    ALLERGIES: Penicillins  MEDICATIONS:  Current Outpatient Medications  Medication Sig Dispense Refill  . acetaminophen (TYLENOL) 500 MG tablet Take 1,000 mg by mouth daily as needed for pain.    [name] amLODipine (NORVASC) 10 MG tablet Take 10 mg by mouth daily.      . benazepril (LOTENSIN) 40 MG tablet Take 40 mg by mouth 2 (two) times daily.     . calcitRIOL (ROCALTROL) 0.25 MCG capsule Take by mouth daily.  6  . Cinnamon 500 MG capsule Take 500 mg by mouth daily.    [name]  clopidogrel (PLAVIX) 75 MG tablet Take 1 tablet (75 mg total) by mouth daily. 90 tablet 3  . diclofenac sodium (VOLTAREN) 1 % GEL Apply 2 g topically 4 (four) times daily. Rub into affected area of foot 2 to 4 times daily 100 g 2  . furosemide (LASIX) 80 MG tablet Take 40 mg by mouth 2 (two) times daily.     . insulin lispro (HUMALOG) 100 UNIT/ML injection Inject 11 Units into the skin 2 (two) times daily before lunch and supper.     . losartan (COZAAR) 100 MG tablet Take 100 mg by mouth 2 (two) times daily.     . metoprolol (LOPRESSOR) 100 MG tablet Take 100 mg by mouth 2 (two) times daily.      . mirtazapine (REMERON) 15 MG tablet Take 15 mg by mouth at bedtime.    Salley Scarlet FORMULARY [facility]  Scar Cream -  Verapamil 10%, Pentoxifylline 5% Apply 1-2 grams to affected area 3-4 times daily Qty. 120 gm 3 refills    . rosuvastatin (CRESTOR) 10 MG tablet Take 5 mg by mouth daily.    Nelva Nay SOLOSTAR 300 UNIT/ML SOPN Inject 28 Units into the skin daily before breakfast.     . TURMERIC PO Take 400 mg by mouth 2 (two) times daily.     No current facility-administered medications for this encounter.     REVIEW OF SYSTEMS:  On review of systems, the patient reports that he is doing well overall. He denies any chest pain, shortness of breath, cough, fevers, chills, night sweats, unintended weight changes. He denies any bowel or bladder disturbances, and denies abdominal pain,  nausea or vomiting. He denies any new musculoskeletal or joint aches or pains. A complete review of systems is obtained and is otherwise negative.  PHYSICAL EXAM:  Wt Readings from Last 3 Encounters:  06/11/18 219 lb 8 oz (99.6 kg)  06/11/18 219 lb 8 oz (99.6 kg)  06/09/18 215 lb (97.5 kg)   Temp Readings from Last 3 Encounters:  06/11/18 97.6 F (36.4 C)  06/11/18 97.6 F (36.4 C)  06/09/18 (!) 97.3 F (36.3 C)   BP Readings from Last 3 Encounters:  06/11/18 (!) 167/81  06/11/18 (!) 167/81  06/09/18 (!) 144/58   Pulse Readings from Last 3 Encounters:  06/11/18 61  06/11/18 61  06/09/18 (!) 57    /10  In general this is a well appearing caucasian gentleman in no acute distress. He is alert and oriented x4 and appropriate throughout the examination. HEENT reveals that the patient is normocephalic, atraumatic. EOMs are intact. PERRLA. Skin is intact without any evidence of gross lesions. Cardiovascular exam reveals a regular rate and rhythm, no clicks rubs or murmurs are auscultated. Chest is clear to auscultation bilaterally. Lymphatic assessment was performed and does not reveal any adenopathy in the cervical, supraclavicular, axillary, or inguinal chains. Abdomen has active bowel sounds in all quadrants and is intact. The abdomen is soft, non tender, non distended. Lower extremities are negative for pretibial pitting edema, deep calf tenderness, cyanosis or clubbing.  KPS = 90  100 - Normal; no complaints; no evidence of disease. 90   - Able to carry on normal activity; minor signs or symptoms of disease. 80   - Normal activity with effort; some signs or symptoms of disease. 27   - Cares for self; unable to carry on normal activity or to do active work. 60   - Requires occasional assistance, but is able to  care for most of his personal needs. 50   - Requires considerable assistance and frequent medical care. 85   - Disabled; requires special care and assistance. 70   -  Severely disabled; hospital admission is indicated although death not imminent. 39   - Very sick; hospital admission necessary; active supportive treatment necessary. 10   - Moribund; fatal processes progressing rapidly. 0     - Dead  Karnofsky DA, Abelmann Morris, Craver LS and [place] JH [phone]) The use of the nitrogen mustards in the palliative treatment of carcinoma: with particular reference to bronchogenic carcinoma Cancer 1 634-56  LABORATORY DATA:  Lab Results  Component Value Date   WBC 7.9 06/09/2018   HGB 14.9 06/09/2018   HCT 47.4 06/09/2018   MCV 88.9 06/09/2018   PLT 188 06/09/2018   Lab Results  Component Value Date   NA 142 06/09/2018   K 4.6 06/09/2018   CL 107 06/09/2018   CO2 25 06/09/2018   Lab Results  Component Value Date   ALT 16 06/09/2018   AST 17 06/09/2018   ALKPHOS 60 06/09/2018   BILITOT 0.4 06/09/2018     RADIOGRAPHY: Dg Chest 2 View  Result Date: 06/09/2018 CLINICAL DATA:  Preoperative evaluation.  Lung nodules. EXAM: CHEST - 2 VIEW COMPARISON:  CT chest June 04, 2018 FINDINGS: Cardiomediastinal silhouette is normal. Calcified aortic arch. No pleural effusions or focal consolidations. Trachea projects midline and there is no pneumothorax. Soft tissue planes and included osseous structures are non-suspicious. Surgical clips in the included right abdomen compatible with cholecystectomy. Mild degenerative change of the thoracic spine. IMPRESSION: 1. No acute cardiopulmonary process. 2.  Aortic Atherosclerosis (ICD10-I70.0). Electronically Signed   By: Elon Alas M.D.   On: 06/09/2018 06:26   Dg Chest Port 1 View  Result Date: 06/09/2018 CLINICAL DATA:  67 year old male with a history prior video bronchoscopy EXAM: PORTABLE CHEST 1 VIEW COMPARISON:  06/09/2018 FINDINGS: Cardiomediastinal silhouette unchanged in size and contour. No evidence of central vascular congestion. No confluent airspace disease. No pleural effusion. No pneumothorax. Interval  placement of bilateral fiducials. No acute displaced fracture. IMPRESSION: No complicating features status post bronchoscopy. Interval placement of bilateral fiducials. Electronically Signed   By: Corrie Mckusick D.O.   On: 06/09/2018 11:16   Ct Super D Chest Wo Contrast  Result Date: 06/04/2018 CLINICAL DATA:  Pulmonary nodule EXAM: CT CHEST WITHOUT CONTRAST TECHNIQUE: Multidetector CT imaging of the chest was performed using thin slice collimation for electromagnetic bronchoscopy planning purposes, without intravenous contrast. COMPARISON:  01/08/2018 FINDINGS: Cardiovascular: The heart size is normal. No substantial pericardial effusion. Coronary artery calcification is evident. Atherosclerotic calcification is noted in the wall of the thoracic aorta. Mediastinum/Nodes: No mediastinal lymphadenopathy. No evidence for gross hilar lymphadenopathy although assessment is limited by the lack of intravenous contrast on today's study. The esophagus has normal imaging features. There is no axillary lymphadenopathy. Lungs/Pleura: Interval progression of posterior right lower lobe bilobed nodule now measuring 16 x 11 mm compared to 10 x 6 mm on the prior study. Multi lobular lesion in the inferior left lower lobe measures 20 x 12 mm today compared to 15 x 10 mm previously. Similar appearance of other scattered very tiny bilateral pulmonary nodules. No focal airspace consolidation. No pulmonary edema or pleural effusion. Upper Abdomen: Unremarkable Musculoskeletal: Bilateral symmetric gynecomastia. No worrisome lytic or sclerotic osseous abnormality. IMPRESSION: 1. Interval progression of left lower lobe and right lower lobe pulmonary nodules. 2. Scattered tiny bilateral nodules measuring less  than 4 mm are stable in the interval. 3. No lymphadenopathy in the thorax. 4.  Aortic Atherosclerois (ICD10-170.0) Electronically Signed   By: Misty Stanley M.D.   On: 06/04/2018 12:57   Dg C-arm Bronchoscopy  Result Date:  06/09/2018 C-ARM BRONCHOSCOPY: Fluoroscopy was utilized by the requesting physician.  No radiographic interpretation.      IMPRESSION/PLAN: 1. 67 y.o. gentleman with synchronous Stage 1A NSCLC, squamous cell carcinoma in the RUL (endobronchial), RLL and LLL.  Today, we talked to the patient and his wife about the findings and workup thus far. We discussed the natural history of lung carcinoma and general treatment, highlighting the role of radiotherapy in the management. We discussed the available radiation techniques, and focused on the details of logistics and delivery. The recommendation is to proceed with gated stereotactic radiation therapy to the RUL, RLL and LLL nodules.  We anticipate 5 fractions of SBRT to the RLL and LLL nodules and 10 treatments to the RUL.  We reviewed the anticipated acute and late sequelae associated with radiation in this setting.  The patient was encouraged to ask questions that were answered to the best of our ability and to his satisfaction.   At the conclusion of our discussion, the patient elects to proceed with SBRT to the RUL, RLL and LLL nodules as recommended.  He is scheduled for CT simulation next Friday, August 9th at 1pm in anticipation of beginning radiotherapy in the near future.  He appears to have a good understanding of his disease and our recommendations and is in agreement with the stated plan.  He knows to call with any questions or concerns in the interim.  ----------------------------------------------------------------------------  Nicholos Johns, PA-C    Tyler Pita, MD  [facility] Direct Dial: [phone]  Fax: [phone] .com  Skype  [email or web address]  This document serves as a record of services personally performed by Tyler Pita, MD and Ashlyn Bruning PA-C. It was created on their behalf by Delton Coombes, a trained medical scribe. The creation of this record is based on the scribe's personal  observations and the provider's statements to them.

## 2018-06-12 NOTE — Progress Notes (Signed)
[address]       [place] [address]             [phone]                    Travis [name] Medical Record #[id number] Date of Birth: 09/24/1951  Referring: [name], PA-C Primary Care: [name], MD  Chief Complaint:    No chief complaint on file.   History of Present Illness:    Travis [name] 67 y.o. male is seen in the office  today for follow-up CT of the chest. The  patient notes that several years ago he began having lung cancer screening CTs  is done through the [place]. a follow-upscan was performed and he was told of lesion on the scan "looked mushy".  PET scan was done through the [place] system and the patient was referred to thoracic surgery.  The patient gives a history of smoking for many years since age 46, up to a pack a day over that time.  Most recently he quit -05/09/2015.   Notes his past history is significant for multiple cardiac stents in 2000. ,  Last cath was approximately 8 years ago.  He has a history of diabetes mellitus and chronic renal insufficiency.  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    Current Activity/ Functional Status:  Patient is independent with mobility/ambulation, transfers, ADL's, IADL's.   Zubrod Score: At the time of surgery this patient's most appropriate activity status/level should be described as: []     0    Normal activity, no symptoms [x]     1    Restricted in physical strenuous activity but ambulatory, able to do out light work []     2    Ambulatory and capable of self care, unable to do work activities, up and about               >50 % of waking hours                               []     3    Only limited self care, in bed greater than 50% of waking hours []     4    Completely disabled, no self care, confined to bed or chair []     5    Moribund Patient notes that he is limited in ambulation because "his legs lock up".  Past Medical History:  Diagnosis Date  . CAD (coronary artery disease)   . Cancer ([place])    skin cancer face  . COPD (chronic obstructive pulmonary disease) ([place])   . Coronary artery disease    PCA 1994l; PCI august 2003 of an obtust marginal. Catheterization at the time reveal: left main normal; LAD 50-75% between first and second diagonals, 50% after lesion; left circumflex 95% in obtuse marg. RCA 100% proximal.  . Diabetes mellitus    type 2  . Hyperlipemia   . Hypertension   . Kidney disease    chronic; stage 2 followed By Dr Deterding  . Myocardial infarction (  [place])   . Osteoarthritis   . Peripheral vascular disease ([place])   . Tobacco abuse     Past Surgical History:  Procedure Laterality Date  . APPENDECTOMY    . CHOLECYSTECTOMY    . CORONARY ANGIOPLASTY     stent times 2, last cath 3 years ago  . FUDUCIAL PLACEMENT N/A 06/09/2018   Procedure: possible, PLACEMENT OF FUDUCIAL;  Surgeon: Grace Isaac, MD;  Location: [facility];  Service: Thoracic;  Laterality: N/A;  . NERVE EXPLORATION  07/29/2012   Procedure: NERVE EXPLORATION;  Surgeon: Schuyler Amor, MD;  Location: [facility];  Service: Orthopedics;  Laterality: Left;  Exploration /repair nerve and tendon left thumb  . PERIPHERAL VASCULAR CATHETERIZATION N/A 04/26/2015   Procedure: Abdominal Aortogram;  Surgeon: Rosetta Posner, MD;  Location: [facility];  Service: Cardiovascular;  Laterality: N/A;  . VIDEO BRONCHOSCOPY N/A 06/09/2018   Procedure: VIDEO BRONCHOSCOPY;  Surgeon: Grace Isaac, MD;  Location: [facility];  Service: Thoracic;  Laterality: N/A;  . VIDEO BRONCHOSCOPY WITH ENDOBRONCHIAL NAVIGATION N/A 06/09/2018   Procedure: VIDEO BRONCHOSCOPY WITH  ENDOBRONCHIAL NAVIGATION;  Surgeon: Grace Isaac, MD;  Location: [facility];  Service: Thoracic;  Laterality: N/A;    Family History  Problem Relation Age of Onset  . Coronary artery disease Mother   . Heart disease Mother   . Cancer Father   . Stroke Other   . Heart failure Other   . Pulmonary embolism Other   . Diabetes Brother   . Hypertension Brother   . Heart attack Brother   . Peripheral vascular disease Brother   . Heart disease Brother   . Hypertension Sister       Social History   Tobacco Use  Smoking Status Former Smoker  . Packs/day: 0.50  . Years: 50.00  . Pack years: 25.00  . Types: Cigarettes  . Last attempt to quit: 05/09/2015  . Years since quitting: 3.0  Smokeless Tobacco Former Systems developer  . Types: Chew  . Quit date: 2016    Social History   Substance and Sexual Activity  Alcohol Use No  . Alcohol/week: 0.0 oz     Allergies  Allergen Reactions  . Penicillins Other (See Comments)    UNSPECIFIED CHILDHOOD REACTION Has patient had PCN reaction causing immediate rash, facial/tongue/throat swelling, SOB or lightheadedness with hypotension: Unknown Has patient had a PCN reaction causing severe rash involving mucus membranes or skin necrosis: Unknown Has patient had a PCN reaction that required hospitalization: No Has patient had a PCN reaction occurring within the last 10 years: No If all of the above answers are "NO", then may proceed with Cephalosporin use.     Current Outpatient Medications  Medication Sig Dispense Refill  . acetaminophen (TYLENOL) 500 MG tablet Take 1,000 mg by mouth daily as needed for pain.    [name] amLODipine (NORVASC) 10 MG tablet Take 10 mg by mouth daily.      . benazepril (LOTENSIN) 40 MG tablet Take 40 mg by mouth 2 (two) times daily.     . calcitRIOL (ROCALTROL) 0.25 MCG capsule Take by mouth daily.  6  . Cinnamon 500 MG capsule Take 500 mg by mouth daily.    . clopidogrel (PLAVIX) 75 MG tablet Take 1 tablet (75 mg total)  by mouth daily. 90 tablet 3  . diclofenac sodium (VOLTAREN) 1 % GEL Apply 2 g topically 4 (four) times daily. Rub into affected area of foot 2 to 4 times  daily 100 g 2  . furosemide (LASIX) 80 MG tablet Take 40 mg by mouth 2 (two) times daily.     . insulin lispro (HUMALOG) 100 UNIT/ML injection Inject 11 Units into the skin 2 (two) times daily before lunch and supper.     . losartan (COZAAR) 100 MG tablet Take 100 mg by mouth 2 (two) times daily.     . metoprolol (LOPRESSOR) 100 MG tablet Take 100 mg by mouth 2 (two) times daily.      . mirtazapine (REMERON) 15 MG tablet Take 15 mg by mouth at bedtime.    Salley Scarlet FORMULARY [facility]  Scar Cream -  Verapamil 10%, Pentoxifylline 5% Apply 1-2 grams to affected area 3-4 times daily Qty. 120 gm 3 refills    . rosuvastatin (CRESTOR) 10 MG tablet Take 5 mg by mouth daily.    Nelva Nay SOLOSTAR 300 UNIT/ML SOPN Inject 28 Units into the skin daily before breakfast.     . TURMERIC PO Take 400 mg by mouth 2 (two) times daily.     No current facility-administered medications for this visit.       Review of Systems: No change in the review of systems since last seen with exception of a small amount of hemoptysis the day following his bronchoscopy but none since Review of Systems  Constitutional: Negative.   HENT: Negative.   Eyes: Negative.   Respiratory: Positive for cough. Negative for hemoptysis, sputum production, shortness of breath and wheezing.   Cardiovascular: Negative.   Gastrointestinal: Negative.   Genitourinary: Negative.   Musculoskeletal: Negative.   Skin: Negative.   Neurological: Negative.   Endo/Heme/Allergies: Negative.   Psychiatric/Behavioral: Negative.    Physical Exam: BP (!) 167/81   Pulse 61   Temp 97.6 F (36.4 C)   Resp 18   Wt 219 lb 8 oz (99.6 kg)   SpO2 98%   BMI 29.77 kg/m   PHYSICAL EXAMINATION: General appearance: alert, cooperative and no distress Head: Normocephalic, without obvious  abnormality, atraumatic Neck: no adenopathy, no carotid bruit, no JVD, supple, symmetrical, trachea midline and thyroid not enlarged, symmetric, no tenderness/mass/nodules Lymph nodes: Cervical, supraclavicular, and axillary nodes normal. Resp: clear to auscultation bilaterally Back: symmetric, no curvature. ROM normal. No CVA tenderness. Cardio: regular rate and rhythm, S1, S2 normal, no murmur, click, rub or gallop GI: soft, non-tender; bowel sounds normal; no masses,  no organomegaly Extremities: extremities normal, atraumatic, no cyanosis or edema and Homans sign is negative, no sign of DVT Neurologic: Grossly normal  Diagnostic Studies & Laboratory data:     Recent Radiology Findings:  Ct Super D Chest Wo Contrast  Result Date: 06/04/2018 CLINICAL DATA:  Pulmonary nodule EXAM: CT CHEST WITHOUT CONTRAST TECHNIQUE: Multidetector CT imaging of the chest was performed using thin slice collimation for electromagnetic bronchoscopy planning purposes, without intravenous contrast. COMPARISON:  01/08/2018 FINDINGS: Cardiovascular: The heart size is normal. No substantial pericardial effusion. Coronary artery calcification is evident. Atherosclerotic calcification is noted in the wall of the thoracic aorta. Mediastinum/Nodes: No mediastinal lymphadenopathy. No evidence for gross hilar lymphadenopathy although assessment is limited by the lack of intravenous contrast on today's study. The esophagus has normal imaging features. There is no axillary lymphadenopathy. Lungs/Pleura: Interval progression of posterior right lower lobe bilobed nodule now measuring 16 x 11 mm compared to 10 x 6 mm on the prior study. Multi lobular lesion in the inferior left lower lobe measures 20 x 12 mm today compared to 15 x 10 mm  previously. Similar appearance of other scattered very tiny bilateral pulmonary nodules. No focal airspace consolidation. No pulmonary edema or pleural effusion. Upper Abdomen: Unremarkable  Musculoskeletal: Bilateral symmetric gynecomastia. No worrisome lytic or sclerotic osseous abnormality. IMPRESSION: 1. Interval progression of left lower lobe and right lower lobe pulmonary nodules. 2. Scattered tiny bilateral nodules measuring less than 4 mm are stable in the interval. 3. No lymphadenopathy in the thorax. 4.  Aortic Atherosclerois (ICD10-170.0) Electronically Signed   By: Misty Stanley M.D.   On: 06/04/2018 12:57   I have independently reviewed the above radiology studies  and reviewed the findings with the patient.  Ct Super D Chest Wo Contrast  Result Date: 01/08/2018 CLINICAL DATA:  Left lower lobe pulmonary nodule. Preoperative planning. EXAM: CT CHEST WITHOUT CONTRAST TECHNIQUE: Multidetector CT imaging of the chest was performed using thin slice collimation for electromagnetic bronchoscopy planning purposes, without intravenous contrast. COMPARISON:  Outside CT scans and PET-CT. The PET-CT is dated 08/15/2017 and is from [facility] FINDINGS: Cardiovascular: The heart is within normal limits in size. No pericardial effusion. Extensive atherosclerotic calcifications involving the thoracic aorta and branch vessels including dense three-vessel coronary artery calcifications. No focal aneurysm. Mediastinum/Nodes: Small scattered sub 8 mm mediastinal and hilar lymph nodes. No mass or adenopathy. The esophagus is grossly normal. Lungs/Pleura: 15 x 10 mm left lower lobe pulmonary lesion noted on image number 102. Several small sub 4 mm nodules are noted in both lungs. These appears stable. 9.5 x 5.5 mm bilobed nodular density in the right lower lobe on image number 90 associated with a slightly dilated bronchus just proximally. This is new since the prior CT scan and could represent mucoid impaction or endobronchial lesion. Stable underlying emphysematous changes. Upper Abdomen: No significant upper abdominal findings. There is a stable left adrenal gland adenoma. No findings suspicious for hepatic  metastatic disease. Advanced atherosclerotic calcifications involving the upper abdominal aorta and visualized branch vessels. Musculoskeletal: No chest wall mass, supraclavicular or axillary lymphadenopathy. The thyroid gland is grossly normal. Mild bilateral symmetric gynecomastia. No significant bony findings. IMPRESSION: 1. 15 x 10 mm left lower lobe pulmonary lesion appears relatively stable in size when compared to the prior outside PET-CT. 2. New bilobed lesion in the right lower lobe just distal to a slightly dilated bronchus. This could be mucoid impaction or small endobronchial lesion. Recommend continued surveillance. 3. Several small, sub 4 mm, pulmonary nodules in both lungs. 4. No mediastinal or hilar mass or adenopathy. 5. Stable emphysematous changes and pulmonary scarring. 6. Significant atherosclerotic calcifications involving the thoracic and abdominal aorta and branch vessels including dense and extensive three-vessel coronary artery calcifications. Aortic Atherosclerosis (ICD10-I70.0) and Emphysema (ICD10-J43.9). Electronically Signed   By: Marijo Sanes M.D.   On: 01/08/2018 15:25  I have independently reviewed the above radiology studies  and reviewed the findings with the patient.   Description:  CT OF THE CHEST, LUNG CANCER SCREENING   HISTORY: lung cancer screening   COMPARISON: 08/05/2016   TECHNIQUE: CT images of the chest were obtained without intravenous  contrast utilizing low dose technique.   FINDINGS:   Lung nodules, measured on series 2: Left lower lobe nodular opacity is  similar in measured size but increased in soft tissue bulk, measuring 17 x  21 mm image 171, and there is a new nodular component contacting the  inferior pleura measuring 9 x 11 mm image 173, also seen on coronal series  603 image 84. New 7 mm (mean diameter) cavitary nodule right  lower lobe  series 2 image 141. Otherwise similar innumerable nodules including 4 mm  left upper lobe image 58,  4 mm left upper lobe image 78, 5 mm subpleural  superior left upper lobe image 69.   Airways: Diffuse bronchial wall thickening suggesting chronic bronchitis.   COPD: Mild upper lobe predominant emphysema.   Additional lung findings: Interval development of partial collapse  anterior segment right upper lobe.   Pleura:No pleural effusion.   Heart/Coronary Arteries: Heart size within normal limits. Heavy  atherosclerotic coronary artery calcifications. Coronary stents present.   Mediastinum/Hila/Axilla: No lymphadenopathy.   Other findings: Bilateral gynecomastia. Cholecystectomy   ______________________     Impression:    1. Enlarging left lower lobe nodular opacity with new adjacent 10 mm  (mean diameter) nodule contacting the inferior pleura. 2. New 7 mm mean  diameter cavitary nodule right lower lobe. 3. Otherwise stable nodules  as above.   LUNG-RADS CATEGORY:   4 - Suspicious.   RECOMMENDATION: Thoracic surgery referral.    Electronically Signed By: Minette Brine Electronically Signed On:  08/07/2017 2:45 PM     Diagnosis:  1214 LUNGRADS 4A: SUSPICIOUS NODULE      Description:  Procedure: Following at least four-hour fasting, the patient's blood  glucose was 121 mg/dL. One hour following the injection of 12.315 mCi of  F-[id number], low dose CT images were obtained from the orbital meatal line  through the mid thigh. Then, PET images were obtained through the same  region. Attenuation corrected images were constructed using the CT scan.  Fused images of PET and CT were reviewed. The standard uptake values (SUV)  reported below are maximum values within a region of interest, expressed  in gm/ml.   Comparison: Multiple prior CTs   Findings: Evaluation of the brain demonstrates no abnormality in the  radiotracer distribution of the cerebrum or cerebellum. Atherosclerotic  calcifications are seen in the vertebrobasilar and internal carotid  arteries.  Mucosal thickening is seen in the right ethmoid air cells.   Evaluation of the neck demonstrates physiologic uptake within the salivary  glands and remaining cervical structures. No foci of hypermetabolic  activity are identified within cervical lymph nodes. Atherosclerotic  calcifications are seen within the carotid arterial tree.   Evaluation of the thorax demonstrates physiologic myocardial activity.  Atherosclerotic calcifications are seen in the thoracic aorta and branch  vessels to include severe coronary artery calcifications. Focal  collapse/consolidation is again seen in the anterior right upper lobe as  was noted on the most recent prior chest CT examination. Mild  hypermetabolic activity is seen in the branching nodular opacity in the  posteromedial left lower lobe (SUV max 2.3), nonspecific. A small  peribronchovascular nodule in the posterolateral right lower lobe which  was new on the most recent prior study demonstrates minimal nonspecific  hypermetabolic activity. Additional small nodules are present which are  below size resolution for the PET portion of this examination. There are  no hypermetabolic mediastinal or hilar lymph nodes.   Examination of the abdomen and pelvis shows physiologic uptake within the  adrenal glands, liver, spleen, kidneys, ureters and bladder. The pancreas  is unremarkable. Status post cholecystectomy. There are no hypermetabolic  lymph nodes. Atherosclerotic calcifications are seen throughout the  abdominal aorta and branch vessels. No AAA. A few colonic diverticula are  noted. There is no evidence of bowel obstruction. Diffuse hypermetabolic  activity is seen within the stomach which is greater than expected for  physiologic activity. A small lipoma  is seen in the right lateral thigh  musculature.   No worrisome hypermetabolic osseous lesions are noted. Degenerative  changes are noted with mild expected hypermetabolic activity.      Impression:  1. Focal collapse/consolidation is again seen in the anterior right upper  lobe as was noted on the recent prior chest CT examination. Small  peribronchovascular nodule in the posterolateral right lower lobe which  was new on the most recent prior study demonstrates minimal nonspecific  activity. Additional small nodules are present which are below size  resolution for the PET portion of this examination. Clinical correlation  for signs/symptoms of infection and appropriate treatment are recommended.   2. Mild nonspecific hypermetabolic activity is seen in the branching  nodular opacity in the posteromedial left lower lobe. Continued attention  on follow-up low dose CT is recommended.   3. Diffuse hypermetabolic activity within the stomach which is greater  than expected for physiologic activity. Clinical correlation and  correlation with endoscopic findings is recommended.   Electronically Signed By: Ishmael Holter Electronically Signed  On: 08/15/2017 2:37 PM      I have independently reviewed the above radiologic studies.  Recent Lab Findings: Lab Results  Component Value Date   WBC 9.9 03/05/2013   HGB 15.3 04/26/2015   HCT 45.0 04/26/2015   PLT 170 03/05/2013   GLUCOSE 103 (H) 04/26/2015   CHOL  10/28/2009    80        ATP III CLASSIFICATION:  <200     mg/dL   Desirable  200-239  mg/dL   Borderline High  >=240    mg/dL   High          TRIG 131 10/28/2009   HDL 29 (L) 10/28/2009   LDLCALC  10/28/2009    25        Total Cholesterol/HDL:CHD Risk Coronary Heart Disease Risk Table                     Men   Women  1/2 Average Risk   3.4   3.3  Average Risk       5.0   4.4  2 X Average Risk   9.6   7.1  3 X Average Risk  23.4   11.0        Use the calculated Patient Ratio above and the CHD Risk Table to determine the patient's CHD Risk.        ATP III CLASSIFICATION (LDL):  <100     mg/dL   Optimal  100-129  mg/dL   Near or Above                     Optimal  130-159  mg/dL   Borderline  160-189  mg/dL   High  >190     mg/dL   Very High   NA 139 04/26/2015   K 4.7 04/26/2015   CL 107 04/26/2015   CREATININE 2.30 (H) 04/26/2015   BUN 62 (H) 04/26/2015   CO2 26 03/05/2013   TSH 1.330 Test methodology is 3rd generation TSH 10/26/2009   INR 1.01 10/26/2009   HGBA1C (H) 10/26/2009    6.7 (NOTE) The ADA recommends the following therapeutic goal for glycemic control related to Hgb A1c measurement: Goal of therapy: <6.5 Hgb A1c  Reference: American Diabetes Association: Clinical Practice Recommendations 2010, Diabetes Care, 2010, 33: (Suppl  1).   Chronic Kidney Disease   Stage I     GFR >  90  Stage II    GFR 60-89  Stage IIIA GFR 45-59  Stage IIIB GFR 30-44  Stage IV   GFR 15-29  Stage V    GFR  <15  Lab Results  Component Value Date   CREATININE 2.27 (H) 06/09/2018   Estimated Creatinine Clearance: 37.5 mL/min (A) (by C-G formula based on SCr of 2.27 mg/dL (H)).  FEV1 1.83   51% 11% improvement with bronchdilators  DLCO 18.5  51% Pulmonary Function Diagnosis: Severe Obstructive Airways Disease Insignificant response to bronchodilator Moderate Restriction -Parenchymal Moderately severe Diffusion Defect  Assessment / Plan:   1/Branching nodular opacity in the posteromedial left lower lobe-nonspecific finding on PET scan,  2/Interval progression of left lower lobe and right lower lobe pulmonary nodules.  3/Chronic renal insufficiency most recent creatinine in the system 2  years ago 4/long-term smoking history and symptoms suggestive of significant COPD-formal pulmonary function studies will be obtained 5/known significant coronary occlusive disease with previous stents placed currently on Plavix-checked with cardiology and is okay to temporarily hold his Plavix.  Because of easy bruising he had stopped his aspirin previously.  With CT documented interval progression of both left lower lobe and right lower  lobe pulmonary nodules and now biopsy-proven squamous cell carcinoma involving the right upper lobe patient's treatment options have been discussed with the [facility] thoracic oncology conference films and pathology were reviewed.  Patient has seen radiation oncology who is agreeable to proceed with stereotactic radiotherapy both to the lower lobe lesions and the right upper lobe.  Patient is agreeable with this approach  Grace Isaac MD      [address] [place] [address] Office [phone]   [place]  06/12/2018 4:03 PM

## 2018-06-15 ENCOUNTER — Encounter: Payer: Self-pay | Admitting: *Deleted

## 2018-06-15 NOTE — Progress Notes (Signed)
Oncology Nurse Navigator Documentation  Oncology Nurse Navigator Flowsheets 06/15/2018  Navigator Location [facility]  Navigator Encounter Type Clinic/MDC/spoke with patient and wife at thoracic clinic.  Travis [name] has suspected lung cancer and is scheduled for SBRT this week.    Abnormal Finding Date 07/14/2017  Multidisiplinary Clinic Date 06/11/2018  Treatment Initiated Date 06/19/2018  Patient Visit Type MedOnc  Treatment Phase Abnormal Scans  Barriers/Navigation Needs Education  Education Other  Interventions Education  Education Method Verbal  Acuity Level 2  Time Spent with Patient 30

## 2018-06-18 NOTE — Progress Notes (Signed)
  Radiation Oncology         [phone] ________________________________  Name: Travis [name] MRN: [id number]  Date: 06/19/2018  DOB: 14-Aug-1951  STEREOTACTIC BODY RADIOTHERAPY SIMULATION AND TREATMENT PLANNING NOTE    ICD-10-CM   1. Squamous cell carcinoma of lungs, bilateral (HCC) C34.91    C34.92     DIAGNOSIS:  67 y.o. gentleman with synchronous stage 1A NSCLC, squamous cell carcinoma in the RUL (endobronchial), RLL and LLL.  NARRATIVE:  The patient was brought to the [facility].  Identity was confirmed.  All relevant records and images related to the planned course of therapy were reviewed.  The patient freely provided informed written consent to proceed with treatment after reviewing the details related to the planned course of therapy. The consent form was witnessed and verified by the simulation staff.  Then, the patient was set-up in a stable reproducible  supine position for radiation therapy.  A BodyFix immobilization pillow was fabricated for reproducible positioning.  Then I personally applied the abdominal compression paddle to limit respiratory excursion.  4D respiratoy motion management CT images were obtained.  Surface markings were placed.  The CT images were loaded into the planning software.  Then, using Cine, MIP, and standard views, the internal target volume (ITV) and planning target volumes (PTV) were delinieated, and avoidance structures were contoured.  Treatment planning then occurred.  The radiation prescription was entered and confirmed.  A total of two complex treatment devices were fabricated in the form of the BodyFix immobilization pillow and a neck accuform cushion.  I have requested : 3D Simulation  I have requested a DVH of the following structures: Heart, Lungs, Esophagus, Chest Wall, Brachial Plexus, Major Blood Vessels, and targets.  SPECIAL TREATMENT PROCEDURE:  The planned course of therapy using radiation constitutes a special treatment  procedure. Special care is required in the management of this patient for the following reasons. This treatment constitutes a Special Treatment Procedure for the following reason: [ High dose per fraction requiring special monitoring for increased toxicities of treatment including daily imaging..  The special nature of the planned course of radiotherapy will require increased physician supervision and oversight to ensure patient's safety with optimal treatment outcomes.  RESPIRATORY MOTION MANAGEMENT SIMULATION:  In order to account for effect of respiratory motion on target structures and other organs in the planning and delivery of radiotherapy, this patient underwent respiratory motion management simulation.  To accomplish this, when the patient was brought to the CT simulation planning suite, 4D respiratoy motion management CT images were obtained.  The CT images were loaded into the planning software.  Then, using a variety of tools including Cine, MIP, and standard views, the target volume and planning target volumes (PTV) were delineated.  Avoidance structures were contoured.  Treatment planning then occurred.  Dose volume histograms were generated and reviewed for each of the requested structure.  The resulting plan was carefully reviewed and approved today.  PLAN:  The patient will receive 54 Gy in 3 fractions to the RLL and LLL while the RUL endobronchial lesion will get 50 Gy in 10 fractions.  ________________________________  Sheral Apley Tammi Klippel, M.D.  This document serves as a record of services personally performed by Tyler Pita MD. It was created on his behalf by Delton Coombes, a trained medical scribe. The creation of this record is based on the scribe's personal observations and the provider's statements to them.

## 2018-06-19 ENCOUNTER — Ambulatory Visit: Payer: Medicare Other | Admitting: Radiation Oncology

## 2018-06-19 ENCOUNTER — Ambulatory Visit
Admission: RE | Admit: 2018-06-19 | Discharge: 2018-06-19 | Disposition: A | Discharge status: Home / Self Care | Payer: Medicare Other | Source: Ambulatory Visit | Attending: Radiation Oncology | Admitting: Radiation Oncology

## 2018-06-19 DIAGNOSIS — Z510 Encounter for antineoplastic radiation therapy: Secondary | ICD-10-CM | POA: Insufficient documentation

## 2018-06-19 DIAGNOSIS — C3411 Malignant neoplasm of upper lobe, right bronchus or lung: Secondary | ICD-10-CM | POA: Diagnosis not present

## 2018-06-19 DIAGNOSIS — C3492 Malignant neoplasm of unspecified part of left bronchus or lung: Secondary | ICD-10-CM

## 2018-06-19 DIAGNOSIS — C3431 Malignant neoplasm of lower lobe, right bronchus or lung: Secondary | ICD-10-CM | POA: Diagnosis not present

## 2018-06-19 DIAGNOSIS — C3491 Malignant neoplasm of unspecified part of right bronchus or lung: Secondary | ICD-10-CM

## 2018-06-28 DIAGNOSIS — C3411 Malignant neoplasm of upper lobe, right bronchus or lung: Secondary | ICD-10-CM | POA: Diagnosis not present

## 2018-06-28 DIAGNOSIS — C3431 Malignant neoplasm of lower lobe, right bronchus or lung: Secondary | ICD-10-CM | POA: Diagnosis not present

## 2018-06-28 DIAGNOSIS — Z510 Encounter for antineoplastic radiation therapy: Secondary | ICD-10-CM | POA: Diagnosis not present

## 2018-06-30 DIAGNOSIS — Z510 Encounter for antineoplastic radiation therapy: Secondary | ICD-10-CM | POA: Diagnosis not present

## 2018-06-30 DIAGNOSIS — C3411 Malignant neoplasm of upper lobe, right bronchus or lung: Secondary | ICD-10-CM | POA: Diagnosis not present

## 2018-06-30 DIAGNOSIS — N183 Chronic kidney disease, stage 3 (moderate): Secondary | ICD-10-CM | POA: Diagnosis not present

## 2018-06-30 DIAGNOSIS — N2581 Secondary hyperparathyroidism of renal origin: Secondary | ICD-10-CM | POA: Diagnosis not present

## 2018-06-30 DIAGNOSIS — C3431 Malignant neoplasm of lower lobe, right bronchus or lung: Secondary | ICD-10-CM | POA: Diagnosis not present

## 2018-07-01 ENCOUNTER — Ambulatory Visit
Admission: RE | Admit: 2018-07-01 | Discharge: 2018-07-01 | Disposition: A | Discharge status: Home / Self Care | Payer: Medicare Other | Source: Ambulatory Visit | Attending: Radiation Oncology | Admitting: Radiation Oncology

## 2018-07-01 DIAGNOSIS — Z510 Encounter for antineoplastic radiation therapy: Secondary | ICD-10-CM | POA: Diagnosis not present

## 2018-07-01 DIAGNOSIS — C3431 Malignant neoplasm of lower lobe, right bronchus or lung: Secondary | ICD-10-CM | POA: Diagnosis not present

## 2018-07-01 DIAGNOSIS — C3411 Malignant neoplasm of upper lobe, right bronchus or lung: Secondary | ICD-10-CM | POA: Diagnosis not present

## 2018-07-02 ENCOUNTER — Ambulatory Visit
Admission: RE | Admit: 2018-07-02 | Discharge: 2018-07-02 | Disposition: A | Discharge status: Home / Self Care | Payer: Medicare Other | Source: Ambulatory Visit | Attending: Radiation Oncology | Admitting: Radiation Oncology

## 2018-07-02 DIAGNOSIS — Z510 Encounter for antineoplastic radiation therapy: Secondary | ICD-10-CM | POA: Diagnosis not present

## 2018-07-02 DIAGNOSIS — C3431 Malignant neoplasm of lower lobe, right bronchus or lung: Secondary | ICD-10-CM | POA: Diagnosis not present

## 2018-07-02 DIAGNOSIS — C3411 Malignant neoplasm of upper lobe, right bronchus or lung: Secondary | ICD-10-CM | POA: Diagnosis not present

## 2018-07-03 ENCOUNTER — Ambulatory Visit: Payer: Medicare Other

## 2018-07-03 ENCOUNTER — Ambulatory Visit
Admission: RE | Admit: 2018-07-03 | Discharge: 2018-07-03 | Disposition: A | Discharge status: Home / Self Care | Payer: Medicare Other | Source: Ambulatory Visit | Attending: Radiation Oncology | Admitting: Radiation Oncology

## 2018-07-03 DIAGNOSIS — C3431 Malignant neoplasm of lower lobe, right bronchus or lung: Secondary | ICD-10-CM | POA: Diagnosis not present

## 2018-07-03 DIAGNOSIS — C3411 Malignant neoplasm of upper lobe, right bronchus or lung: Secondary | ICD-10-CM | POA: Diagnosis not present

## 2018-07-03 DIAGNOSIS — Z510 Encounter for antineoplastic radiation therapy: Secondary | ICD-10-CM | POA: Diagnosis not present

## 2018-07-03 NOTE — Progress Notes (Signed)
Pt here for patient teaching.  Pt given Radiation and You booklet and skin care instructions.  Reviewed areas of pertinence such as fatigue, hair loss, skin changes, throat changes, cough and shortness of breath . Pt able to give teach back of to pat skin and use unscented/gentle soap,Pt demonstrated understanding, needs reinforcement, no evidence of learning, refused teaching and  of information given and will contact nursing with any questions or concerns.     [email or web address]

## 2018-07-06 ENCOUNTER — Ambulatory Visit
Admission: RE | Admit: 2018-07-06 | Discharge: 2018-07-06 | Disposition: A | Discharge status: Home / Self Care | Payer: Medicare Other | Source: Ambulatory Visit | Attending: Radiation Oncology | Admitting: Radiation Oncology

## 2018-07-06 DIAGNOSIS — C3411 Malignant neoplasm of upper lobe, right bronchus or lung: Secondary | ICD-10-CM | POA: Diagnosis not present

## 2018-07-06 DIAGNOSIS — C3431 Malignant neoplasm of lower lobe, right bronchus or lung: Secondary | ICD-10-CM | POA: Diagnosis not present

## 2018-07-06 DIAGNOSIS — Z510 Encounter for antineoplastic radiation therapy: Secondary | ICD-10-CM | POA: Diagnosis not present

## 2018-07-07 ENCOUNTER — Ambulatory Visit
Admission: RE | Admit: 2018-07-07 | Discharge: 2018-07-07 | Disposition: A | Discharge status: Home / Self Care | Payer: Medicare Other | Source: Ambulatory Visit | Attending: Radiation Oncology | Admitting: Radiation Oncology

## 2018-07-07 DIAGNOSIS — C3431 Malignant neoplasm of lower lobe, right bronchus or lung: Secondary | ICD-10-CM | POA: Diagnosis not present

## 2018-07-07 DIAGNOSIS — C3411 Malignant neoplasm of upper lobe, right bronchus or lung: Secondary | ICD-10-CM | POA: Diagnosis not present

## 2018-07-07 DIAGNOSIS — Z510 Encounter for antineoplastic radiation therapy: Secondary | ICD-10-CM | POA: Diagnosis not present

## 2018-07-08 ENCOUNTER — Ambulatory Visit: Payer: Medicare Other

## 2018-07-08 ENCOUNTER — Ambulatory Visit
Admission: RE | Admit: 2018-07-08 | Discharge: 2018-07-08 | Disposition: A | Discharge status: Home / Self Care | Payer: Medicare Other | Source: Ambulatory Visit | Attending: Radiation Oncology | Admitting: Radiation Oncology

## 2018-07-08 DIAGNOSIS — C3431 Malignant neoplasm of lower lobe, right bronchus or lung: Secondary | ICD-10-CM | POA: Diagnosis not present

## 2018-07-08 DIAGNOSIS — Z510 Encounter for antineoplastic radiation therapy: Secondary | ICD-10-CM | POA: Diagnosis not present

## 2018-07-08 DIAGNOSIS — C3411 Malignant neoplasm of upper lobe, right bronchus or lung: Secondary | ICD-10-CM | POA: Diagnosis not present

## 2018-07-09 ENCOUNTER — Ambulatory Visit
Admission: RE | Admit: 2018-07-09 | Discharge: 2018-07-09 | Disposition: A | Discharge status: Home / Self Care | Payer: Medicare Other | Source: Ambulatory Visit | Attending: Radiation Oncology | Admitting: Radiation Oncology

## 2018-07-09 DIAGNOSIS — C3431 Malignant neoplasm of lower lobe, right bronchus or lung: Secondary | ICD-10-CM | POA: Diagnosis not present

## 2018-07-09 DIAGNOSIS — Z510 Encounter for antineoplastic radiation therapy: Secondary | ICD-10-CM | POA: Diagnosis not present

## 2018-07-09 DIAGNOSIS — C3411 Malignant neoplasm of upper lobe, right bronchus or lung: Secondary | ICD-10-CM | POA: Diagnosis not present

## 2018-07-10 ENCOUNTER — Ambulatory Visit
Admission: RE | Admit: 2018-07-10 | Discharge: 2018-07-10 | Disposition: A | Discharge status: Home / Self Care | Payer: Medicare Other | Source: Ambulatory Visit | Attending: Radiation Oncology | Admitting: Radiation Oncology

## 2018-07-10 ENCOUNTER — Ambulatory Visit: Payer: Medicare Other

## 2018-07-10 DIAGNOSIS — C3411 Malignant neoplasm of upper lobe, right bronchus or lung: Secondary | ICD-10-CM | POA: Diagnosis not present

## 2018-07-10 DIAGNOSIS — Z510 Encounter for antineoplastic radiation therapy: Secondary | ICD-10-CM | POA: Diagnosis not present

## 2018-07-10 DIAGNOSIS — C3431 Malignant neoplasm of lower lobe, right bronchus or lung: Secondary | ICD-10-CM | POA: Diagnosis not present

## 2018-07-14 ENCOUNTER — Ambulatory Visit
Admission: RE | Admit: 2018-07-14 | Discharge: 2018-07-14 | Disposition: A | Discharge status: Home / Self Care | Payer: Medicare Other | Source: Ambulatory Visit | Attending: Radiation Oncology | Admitting: Radiation Oncology

## 2018-07-14 DIAGNOSIS — C3431 Malignant neoplasm of lower lobe, right bronchus or lung: Secondary | ICD-10-CM | POA: Diagnosis not present

## 2018-07-14 DIAGNOSIS — Z510 Encounter for antineoplastic radiation therapy: Secondary | ICD-10-CM | POA: Insufficient documentation

## 2018-07-14 DIAGNOSIS — C3411 Malignant neoplasm of upper lobe, right bronchus or lung: Secondary | ICD-10-CM | POA: Insufficient documentation

## 2018-07-15 ENCOUNTER — Ambulatory Visit
Admission: RE | Admit: 2018-07-15 | Discharge: 2018-07-15 | Disposition: A | Discharge status: Home / Self Care | Payer: Medicare Other | Source: Ambulatory Visit | Attending: Radiation Oncology | Admitting: Radiation Oncology

## 2018-07-15 ENCOUNTER — Ambulatory Visit: Payer: Medicare Other

## 2018-07-15 DIAGNOSIS — C3411 Malignant neoplasm of upper lobe, right bronchus or lung: Secondary | ICD-10-CM | POA: Diagnosis not present

## 2018-07-15 DIAGNOSIS — Z510 Encounter for antineoplastic radiation therapy: Secondary | ICD-10-CM | POA: Diagnosis not present

## 2018-07-15 DIAGNOSIS — C3431 Malignant neoplasm of lower lobe, right bronchus or lung: Secondary | ICD-10-CM | POA: Diagnosis not present

## 2018-07-17 ENCOUNTER — Ambulatory Visit: Payer: Medicare Other

## 2018-07-17 ENCOUNTER — Encounter: Payer: Self-pay | Admitting: *Deleted

## 2018-07-22 ENCOUNTER — Ambulatory Visit: Payer: Medicare Other

## 2018-07-24 ENCOUNTER — Ambulatory Visit: Payer: Medicare Other

## 2018-07-29 ENCOUNTER — Ambulatory Visit: Payer: Medicare Other

## 2018-07-31 ENCOUNTER — Ambulatory Visit: Payer: Medicare Other

## 2018-07-31 ENCOUNTER — Encounter: Payer: Self-pay | Admitting: Radiation Oncology

## 2018-07-31 NOTE — Progress Notes (Signed)
°  Radiation Oncology         [phone] ________________________________  Name: Travis [name] MRN: [id number]  Date: 07/31/2018  DOB: 1951-05-23  End of Treatment Note  Diagnosis:   67 y.o. gentleman with synchronous stage 1A NSCLC, squamous cell carcinoma in the RUL (endobronchial), RLL and LLL.     Indication for treatment:  Curative       Radiation treatment dates:   07/01/18 - 07/15/18  Site/dose:   The bilateral lung targets were treated as follows: 1. Left Lung was treated to a total of 60 Gy delivered in 5 fractions of 12 Gy 2. Right Lung was treated to a total of 50 Gy delivered in 5 fractions of 10 Gy 3. Right Upper Lung was treated to a total of 50 Gy delivered in 10 fractions of 5 Gy  Beams/energy:   The patient was treated using stereotactic body radiotherapy according to a 3D conformal radiotherapy plan.  Volumetric arc fields were employed to deliver 6 MV X-rays.  Image guidance was performed with per fraction cone beam CT prior to treatment under personal MD supervision.  Immobilization was achieved using BodyFix Pillow.  Narrative: The patient tolerated radiation treatment relatively well. He denied pain, cough, hemoptysis, painful or difficulty swallowing, appetite issues, and any skin changes. He reported increased shortness of breath that resolved on its own and difficulties getting good sleep at night.    Plan: The patient has completed radiation treatment. The patient will return to radiation oncology clinic for routine followup in one month. I advised him to call or return sooner if he has any questions or concerns related to his recovery or treatment. ________________________________  Sheral Apley. Tammi Klippel, M.D.  This document serves as a record of services personally performed by Tyler Pita, MD. It was created on his behalf by Wilburn Mylar, a trained medical scribe. The creation of this record is based on the scribe's personal observations and the provider's  statements to them. This document has been checked and approved by the attending provider.

## 2018-08-10 DIAGNOSIS — E1122 Type 2 diabetes mellitus with diabetic chronic kidney disease: Secondary | ICD-10-CM | POA: Diagnosis not present

## 2018-08-10 DIAGNOSIS — E669 Obesity, unspecified: Secondary | ICD-10-CM | POA: Diagnosis not present

## 2018-08-10 DIAGNOSIS — N183 Chronic kidney disease, stage 3 (moderate): Secondary | ICD-10-CM | POA: Diagnosis not present

## 2018-08-10 DIAGNOSIS — D631 Anemia in chronic kidney disease: Secondary | ICD-10-CM | POA: Diagnosis not present

## 2018-08-10 DIAGNOSIS — I129 Hypertensive chronic kidney disease with stage 1 through stage 4 chronic kidney disease, or unspecified chronic kidney disease: Secondary | ICD-10-CM | POA: Diagnosis not present

## 2018-08-10 DIAGNOSIS — J449 Chronic obstructive pulmonary disease, unspecified: Secondary | ICD-10-CM | POA: Diagnosis not present

## 2018-08-10 DIAGNOSIS — I2510 Atherosclerotic heart disease of native coronary artery without angina pectoris: Secondary | ICD-10-CM | POA: Diagnosis not present

## 2018-08-10 DIAGNOSIS — I739 Peripheral vascular disease, unspecified: Secondary | ICD-10-CM | POA: Diagnosis not present

## 2018-08-10 DIAGNOSIS — I10 Essential (primary) hypertension: Secondary | ICD-10-CM | POA: Diagnosis not present

## 2018-08-10 DIAGNOSIS — C3490 Malignant neoplasm of unspecified part of unspecified bronchus or lung: Secondary | ICD-10-CM | POA: Diagnosis not present

## 2018-08-10 DIAGNOSIS — E7849 Other hyperlipidemia: Secondary | ICD-10-CM | POA: Diagnosis not present

## 2018-08-10 DIAGNOSIS — Z6829 Body mass index (BMI) 29.0-29.9, adult: Secondary | ICD-10-CM | POA: Diagnosis not present

## 2018-08-10 DIAGNOSIS — Z0000 Encounter for general adult medical examination without abnormal findings: Secondary | ICD-10-CM | POA: Diagnosis not present

## 2018-08-10 DIAGNOSIS — N189 Chronic kidney disease, unspecified: Secondary | ICD-10-CM | POA: Diagnosis not present

## 2018-08-10 DIAGNOSIS — M109 Gout, unspecified: Secondary | ICD-10-CM | POA: Diagnosis not present

## 2018-08-10 DIAGNOSIS — N2581 Secondary hyperparathyroidism of renal origin: Secondary | ICD-10-CM | POA: Diagnosis not present

## 2018-08-10 DIAGNOSIS — M545 Low back pain: Secondary | ICD-10-CM | POA: Diagnosis not present

## 2018-08-10 DIAGNOSIS — E782 Mixed hyperlipidemia: Secondary | ICD-10-CM | POA: Diagnosis not present

## 2018-08-10 DIAGNOSIS — I7389 Other specified peripheral vascular diseases: Secondary | ICD-10-CM | POA: Diagnosis not present

## 2018-08-19 ENCOUNTER — Ambulatory Visit
Admission: RE | Admit: 2018-08-19 | Discharge: 2018-08-19 | Disposition: A | Discharge status: Home / Self Care | Payer: Medicare Other | Source: Ambulatory Visit | Attending: Urology | Admitting: Urology

## 2018-08-19 ENCOUNTER — Encounter: Payer: Self-pay | Admitting: Urology

## 2018-08-19 ENCOUNTER — Other Ambulatory Visit: Payer: Self-pay

## 2018-08-19 VITALS — BP 161/65 | HR 57 | Temp 97.8°F | Resp 20 | Ht 72.0 in | Wt 214.4 lb

## 2018-08-19 DIAGNOSIS — Z880 Allergy status to penicillin: Secondary | ICD-10-CM | POA: Insufficient documentation

## 2018-08-19 DIAGNOSIS — Z794 Long term (current) use of insulin: Secondary | ICD-10-CM | POA: Insufficient documentation

## 2018-08-19 DIAGNOSIS — C3411 Malignant neoplasm of upper lobe, right bronchus or lung: Secondary | ICD-10-CM | POA: Insufficient documentation

## 2018-08-19 DIAGNOSIS — Z79899 Other long term (current) drug therapy: Secondary | ICD-10-CM | POA: Diagnosis not present

## 2018-08-19 DIAGNOSIS — C3432 Malignant neoplasm of lower lobe, left bronchus or lung: Secondary | ICD-10-CM | POA: Diagnosis not present

## 2018-08-19 DIAGNOSIS — C3431 Malignant neoplasm of lower lobe, right bronchus or lung: Secondary | ICD-10-CM | POA: Insufficient documentation

## 2018-08-19 DIAGNOSIS — C3492 Malignant neoplasm of unspecified part of left bronchus or lung: Secondary | ICD-10-CM

## 2018-08-19 DIAGNOSIS — C3491 Malignant neoplasm of unspecified part of right bronchus or lung: Secondary | ICD-10-CM

## 2018-08-19 DIAGNOSIS — N189 Chronic kidney disease, unspecified: Secondary | ICD-10-CM | POA: Insufficient documentation

## 2018-08-19 NOTE — Progress Notes (Addendum)
Radiation Oncology         [phone] ________________________________  Name: REZA CRYMES MRN: [id number]  Date: 08/19/2018  DOB: 09/16/1951  Post Treatment Note  CC: [name], MD  Grace Isaac, MD  Diagnosis:   67 y.o.gentleman with synchronous stage 1A NSCLC, squamous cell carcinoma in the RUL (endobronchial), RLL and LLL.    Interval Since Last Radiation:  5 weeks  07/01/18 - 07/15/18:    The bilateral lung targets were treated as follows: 1. Left Lower Lung was treated to a total of 60 Gy delivered in 5 fractions of 12 Gy 2. Right Lower Lung was treated to a total of 50 Gy delivered in 5 fractions of 10 Gy 3. Right Upper Lung was treated to a total of 50 Gy delivered in 10 fractions of 5 Gy   Narrative:  The patient returns today for routine follow-up.  He tolerated radiation treatment relatively well. He denied pain, cough, hemoptysis, painful or difficulty swallowing, appetite issues, and any skin changes. He reported increased shortness of breath that resolved on its own and difficulties getting good sleep at night.                                On review of systems, the patient states that he is doing well overall.  He specifically denies any recent fever, chills, night sweats, productive cough, hemoptysis, chest pain, shortness of breath or dysphagia.  He reports a healthy appetite and is maintaining his weight.  He continues with modest fatigue but feels that this is gradually improving.  He had a recent follow-up at the [facility] with a PET CT scan performed on 08/17/2018.  He has brought a disc with those images with him today.  ALLERGIES:  is allergic to penicillins.  Meds: Current Outpatient Medications  Medication Sig Dispense Refill  . acetaminophen (TYLENOL) 500 MG tablet Take 1,000 mg by mouth daily as needed for pain.    [name] allopurinol (ZYLOPRIM) 100 MG tablet Take 100 mg by mouth daily. Taking 100 mg tablets daily for one week starting 08-15-18 then take two 100 mg  tablets daily    . amLODipine (NORVASC) 10 MG tablet Take 10 mg by mouth daily.      . benazepril (LOTENSIN) 40 MG tablet Take 40 mg by mouth 2 (two) times daily.     . calcitRIOL (ROCALTROL) 0.25 MCG capsule Take by mouth daily.  6  . Cinnamon 500 MG capsule Take 500 mg by mouth daily.    . clopidogrel (PLAVIX) 75 MG tablet Take 1 tablet (75 mg total) by mouth daily. 90 tablet 3  . diclofenac sodium (VOLTAREN) 1 % GEL Apply 2 g topically 4 (four) times daily. Rub into affected area of foot 2 to 4 times daily 100 g 2  . furosemide (LASIX) 80 MG tablet Take 40 mg by mouth 2 (two) times daily.     . insulin lispro (HUMALOG) 100 UNIT/ML injection Inject 11 Units into the skin 2 (two) times daily before lunch and supper.     . losartan (COZAAR) 100 MG tablet Take 100 mg by mouth 2 (two) times daily.     . metoprolol (LOPRESSOR) 100 MG tablet Take 100 mg by mouth 2 (two) times daily.      . mirtazapine (REMERON) 15 MG tablet Take 15 mg by mouth at bedtime.    [facility]  Scar Cream -  Verapamil 10%, Pentoxifylline 5% Apply 1-2 grams to affected area 3-4 times daily Qty. 120 gm 3 refills    . rosuvastatin (CRESTOR) 10 MG tablet Take 5 mg by mouth daily.    Nelva Nay SOLOSTAR 300 UNIT/ML SOPN Inject 28 Units into the skin daily before breakfast.     . TURMERIC PO Take 400 mg by mouth 2 (two) times daily.     No current facility-administered medications for this encounter.     Physical Findings:  height is 6' (1.829 m) and weight is 214 lb 6.4 oz (97.3 kg). His oral temperature is 97.8 F (36.6 C). His blood pressure is 161/65 (abnormal) and his pulse is 57 (abnormal). His respiration is 20 and oxygen saturation is 100%.  Pain Assessment Pain Score: 0-No pain(Has been having low back pain intermitten  5  no pain at this time)/10 In general this is a well appearing Caucasian male in no acute distress.  He's alert and oriented x4 and appropriate throughout the examination.  Cardiopulmonary assessment is negative for acute distress and he exhibits normal effort.   Lab Findings: Lab Results  Component Value Date   WBC 7.9 06/09/2018   HGB 14.9 06/09/2018   HCT 47.4 06/09/2018   MCV 88.9 06/09/2018   PLT 188 06/09/2018     Radiographic Findings: No results found.  Impression/Plan: 1. 67 y.o.gentleman with synchronous stage 1A NSCLC, squamous cell carcinoma in the RUL (endobronchial), RLL and LLL.   The patient appears to be doing well following radiotherapy.  He had a recent PET CT scan performed through the [facility] on 08/17/2018.  Those images were reviewed today and show a stable appearance of the recently treated lesions without evidence of disease progression. This was discussed with the patient today.  We will plan to repeat a CT chest scan in 3 months time for further evaluation and if this scan remains stable, at that point, we will move forward with serial imaging at six-month intervals until 5 years when, at that point in time, he will have an annual low dose CT scan for surveillance. He will have non-contrast CT scans due to chronic renal insufficiency.  He will return in 3 months to review the findings from the scan to be ordered prior to that visit but knows to call with any questions or concerns in the interim.  He appears to have a good understanding of his disease and our recommendations and is in agreement with this plan.    Nicholos Johns, PA-C

## 2018-08-20 ENCOUNTER — Other Ambulatory Visit: Payer: Self-pay | Admitting: Radiation Oncology

## 2018-08-20 ENCOUNTER — Ambulatory Visit
Admission: RE | Admit: 2018-08-20 | Discharge: 2018-08-20 | Disposition: A | Discharge status: Home / Self Care | Payer: Self-pay | Source: Ambulatory Visit | Attending: Radiation Oncology | Admitting: Radiation Oncology

## 2018-08-20 DIAGNOSIS — C3490 Malignant neoplasm of unspecified part of unspecified bronchus or lung: Secondary | ICD-10-CM

## 2018-09-02 ENCOUNTER — Other Ambulatory Visit: Payer: Self-pay | Admitting: Internal Medicine

## 2018-09-02 DIAGNOSIS — M545 Low back pain, unspecified: Secondary | ICD-10-CM

## 2018-09-06 ENCOUNTER — Ambulatory Visit
Admission: RE | Admit: 2018-09-06 | Discharge: 2018-09-06 | Disposition: A | Discharge status: Home / Self Care | Payer: Medicare Other | Source: Ambulatory Visit | Attending: Internal Medicine | Admitting: Internal Medicine

## 2018-09-06 DIAGNOSIS — M48061 Spinal stenosis, lumbar region without neurogenic claudication: Secondary | ICD-10-CM | POA: Diagnosis not present

## 2018-09-06 DIAGNOSIS — M545 Low back pain, unspecified: Secondary | ICD-10-CM

## 2018-09-10 DIAGNOSIS — Z6829 Body mass index (BMI) 29.0-29.9, adult: Secondary | ICD-10-CM | POA: Diagnosis not present

## 2018-09-10 DIAGNOSIS — M5440 Lumbago with sciatica, unspecified side: Secondary | ICD-10-CM | POA: Diagnosis not present

## 2018-09-10 DIAGNOSIS — I10 Essential (primary) hypertension: Secondary | ICD-10-CM | POA: Diagnosis not present

## 2018-09-15 DIAGNOSIS — M5440 Lumbago with sciatica, unspecified side: Secondary | ICD-10-CM | POA: Diagnosis not present

## 2018-09-22 DIAGNOSIS — M5440 Lumbago with sciatica, unspecified side: Secondary | ICD-10-CM | POA: Diagnosis not present

## 2018-09-23 DIAGNOSIS — Z6829 Body mass index (BMI) 29.0-29.9, adult: Secondary | ICD-10-CM | POA: Diagnosis not present

## 2018-09-23 DIAGNOSIS — N183 Chronic kidney disease, stage 3 (moderate): Secondary | ICD-10-CM | POA: Diagnosis not present

## 2018-09-23 DIAGNOSIS — I10 Essential (primary) hypertension: Secondary | ICD-10-CM | POA: Diagnosis not present

## 2018-09-23 DIAGNOSIS — Z794 Long term (current) use of insulin: Secondary | ICD-10-CM | POA: Diagnosis not present

## 2018-09-23 DIAGNOSIS — E1129 Type 2 diabetes mellitus with other diabetic kidney complication: Secondary | ICD-10-CM | POA: Diagnosis not present

## 2018-09-24 DIAGNOSIS — M5440 Lumbago with sciatica, unspecified side: Secondary | ICD-10-CM | POA: Diagnosis not present

## 2018-09-28 DIAGNOSIS — M109 Gout, unspecified: Secondary | ICD-10-CM | POA: Diagnosis not present

## 2018-09-29 DIAGNOSIS — M5440 Lumbago with sciatica, unspecified side: Secondary | ICD-10-CM | POA: Diagnosis not present

## 2018-10-01 DIAGNOSIS — M5440 Lumbago with sciatica, unspecified side: Secondary | ICD-10-CM | POA: Diagnosis not present

## 2018-10-05 DIAGNOSIS — M5440 Lumbago with sciatica, unspecified side: Secondary | ICD-10-CM | POA: Diagnosis not present

## 2018-10-07 DIAGNOSIS — M5440 Lumbago with sciatica, unspecified side: Secondary | ICD-10-CM | POA: Diagnosis not present

## 2018-10-12 DIAGNOSIS — M5440 Lumbago with sciatica, unspecified side: Secondary | ICD-10-CM | POA: Diagnosis not present

## 2018-10-14 DIAGNOSIS — N183 Chronic kidney disease, stage 3 (moderate): Secondary | ICD-10-CM | POA: Diagnosis not present

## 2018-10-14 DIAGNOSIS — M5440 Lumbago with sciatica, unspecified side: Secondary | ICD-10-CM | POA: Diagnosis not present

## 2018-10-19 DIAGNOSIS — M5440 Lumbago with sciatica, unspecified side: Secondary | ICD-10-CM | POA: Diagnosis not present

## 2018-10-20 DIAGNOSIS — C3490 Malignant neoplasm of unspecified part of unspecified bronchus or lung: Secondary | ICD-10-CM | POA: Diagnosis not present

## 2018-10-20 DIAGNOSIS — J189 Pneumonia, unspecified organism: Secondary | ICD-10-CM | POA: Diagnosis not present

## 2018-10-20 DIAGNOSIS — I2510 Atherosclerotic heart disease of native coronary artery without angina pectoris: Secondary | ICD-10-CM | POA: Diagnosis not present

## 2018-10-20 DIAGNOSIS — Z6829 Body mass index (BMI) 29.0-29.9, adult: Secondary | ICD-10-CM | POA: Diagnosis not present

## 2018-10-20 DIAGNOSIS — I7389 Other specified peripheral vascular diseases: Secondary | ICD-10-CM | POA: Diagnosis not present

## 2018-10-20 DIAGNOSIS — R0901 Asphyxia: Secondary | ICD-10-CM | POA: Diagnosis not present

## 2018-10-20 DIAGNOSIS — N183 Chronic kidney disease, stage 3 (moderate): Secondary | ICD-10-CM | POA: Diagnosis not present

## 2018-10-20 DIAGNOSIS — J449 Chronic obstructive pulmonary disease, unspecified: Secondary | ICD-10-CM | POA: Diagnosis not present

## 2018-10-20 DIAGNOSIS — E1159 Type 2 diabetes mellitus with other circulatory complications: Secondary | ICD-10-CM | POA: Diagnosis not present

## 2018-10-20 DIAGNOSIS — D6489 Other specified anemias: Secondary | ICD-10-CM | POA: Diagnosis not present

## 2018-10-20 DIAGNOSIS — I10 Essential (primary) hypertension: Secondary | ICD-10-CM | POA: Diagnosis not present

## 2018-10-22 ENCOUNTER — Other Ambulatory Visit: Payer: Self-pay | Admitting: Internal Medicine

## 2018-10-22 ENCOUNTER — Other Ambulatory Visit (HOSPITAL_COMMUNITY): Payer: Self-pay | Admitting: Internal Medicine

## 2018-10-22 DIAGNOSIS — J449 Chronic obstructive pulmonary disease, unspecified: Secondary | ICD-10-CM | POA: Diagnosis not present

## 2018-10-22 DIAGNOSIS — J189 Pneumonia, unspecified organism: Secondary | ICD-10-CM | POA: Diagnosis not present

## 2018-10-22 DIAGNOSIS — R0902 Hypoxemia: Secondary | ICD-10-CM

## 2018-10-22 DIAGNOSIS — E1129 Type 2 diabetes mellitus with other diabetic kidney complication: Secondary | ICD-10-CM | POA: Diagnosis not present

## 2018-10-22 DIAGNOSIS — Z6829 Body mass index (BMI) 29.0-29.9, adult: Secondary | ICD-10-CM | POA: Diagnosis not present

## 2018-10-22 DIAGNOSIS — C3490 Malignant neoplasm of unspecified part of unspecified bronchus or lung: Secondary | ICD-10-CM | POA: Diagnosis not present

## 2018-10-22 DIAGNOSIS — N183 Chronic kidney disease, stage 3 (moderate): Secondary | ICD-10-CM | POA: Diagnosis not present

## 2018-10-22 DIAGNOSIS — I10 Essential (primary) hypertension: Secondary | ICD-10-CM | POA: Diagnosis not present

## 2018-10-22 DIAGNOSIS — R0901 Asphyxia: Secondary | ICD-10-CM | POA: Diagnosis not present

## 2018-10-23 ENCOUNTER — Encounter (HOSPITAL_COMMUNITY)
Admission: RE | Admit: 2018-10-23 | Discharge: 2018-10-23 | Disposition: A | Discharge status: Home / Self Care | Payer: Medicare Other | Source: Ambulatory Visit | Attending: Internal Medicine | Admitting: Internal Medicine

## 2018-10-23 ENCOUNTER — Ambulatory Visit (HOSPITAL_COMMUNITY)
Admission: RE | Admit: 2018-10-23 | Discharge: 2018-10-23 | Disposition: A | Discharge status: Home / Self Care | Payer: Medicare Other | Source: Ambulatory Visit | Attending: Internal Medicine | Admitting: Internal Medicine

## 2018-10-23 DIAGNOSIS — R0902 Hypoxemia: Secondary | ICD-10-CM | POA: Diagnosis not present

## 2018-10-23 DIAGNOSIS — R0602 Shortness of breath: Secondary | ICD-10-CM | POA: Diagnosis not present

## 2018-10-23 MED ORDER — TECHNETIUM TO 99M ALBUMIN AGGREGATED
4.8000 | Freq: Once | INTRAVENOUS | Status: DC | PRN
Start: 2018-10-23 — End: 2018-10-29

## 2018-10-23 MED ORDER — TECHNETIUM TC 99M DIETHYLENETRIAME-PENTAACETIC ACID
32.8000 | Freq: Once | INTRAVENOUS | Status: AC | PRN
  Administered 2018-10-23: 32.8 via INTRAVENOUS

## 2018-10-27 ENCOUNTER — Ambulatory Visit (HOSPITAL_COMMUNITY)
Admission: RE | Admit: 2018-10-27 | Discharge: 2018-10-27 | Disposition: A | Discharge status: Home / Self Care | Payer: Medicare Other | Source: Ambulatory Visit | Attending: Family | Admitting: Family

## 2018-10-27 ENCOUNTER — Other Ambulatory Visit (HOSPITAL_COMMUNITY): Payer: Self-pay | Admitting: Family Medicine

## 2018-10-27 DIAGNOSIS — M5136 Other intervertebral disc degeneration, lumbar region: Secondary | ICD-10-CM | POA: Diagnosis not present

## 2018-10-27 DIAGNOSIS — Z6829 Body mass index (BMI) 29.0-29.9, adult: Secondary | ICD-10-CM | POA: Diagnosis not present

## 2018-10-27 DIAGNOSIS — I10 Essential (primary) hypertension: Secondary | ICD-10-CM | POA: Diagnosis not present

## 2018-10-27 DIAGNOSIS — M79609 Pain in unspecified limb: Secondary | ICD-10-CM | POA: Diagnosis not present

## 2018-10-27 DIAGNOSIS — M79604 Pain in right leg: Secondary | ICD-10-CM

## 2018-10-27 DIAGNOSIS — J449 Chronic obstructive pulmonary disease, unspecified: Secondary | ICD-10-CM | POA: Diagnosis not present

## 2018-10-27 DIAGNOSIS — R0901 Asphyxia: Secondary | ICD-10-CM | POA: Diagnosis not present

## 2018-10-27 DIAGNOSIS — Z7901 Long term (current) use of anticoagulants: Secondary | ICD-10-CM | POA: Diagnosis not present

## 2018-10-27 DIAGNOSIS — I2699 Other pulmonary embolism without acute cor pulmonale: Secondary | ICD-10-CM | POA: Diagnosis not present

## 2018-11-02 ENCOUNTER — Other Ambulatory Visit: Payer: Self-pay | Admitting: Cardiovascular Disease

## 2018-11-12 DIAGNOSIS — M79671 Pain in right foot: Secondary | ICD-10-CM | POA: Diagnosis not present

## 2018-11-12 DIAGNOSIS — R0901 Asphyxia: Secondary | ICD-10-CM | POA: Diagnosis not present

## 2018-11-12 DIAGNOSIS — Z6829 Body mass index (BMI) 29.0-29.9, adult: Secondary | ICD-10-CM | POA: Diagnosis not present

## 2018-11-12 DIAGNOSIS — Z7901 Long term (current) use of anticoagulants: Secondary | ICD-10-CM | POA: Diagnosis not present

## 2018-11-12 DIAGNOSIS — I10 Essential (primary) hypertension: Secondary | ICD-10-CM | POA: Diagnosis not present

## 2018-11-12 DIAGNOSIS — I2699 Other pulmonary embolism without acute cor pulmonale: Secondary | ICD-10-CM | POA: Diagnosis not present

## 2018-11-18 ENCOUNTER — Telehealth: Payer: Self-pay | Admitting: *Deleted

## 2018-11-18 NOTE — Telephone Encounter (Signed)
Patient wishes to cancel follow up appointment until he has his MRI @ the [facility] next week. He is recovering from having blood clots in lung.

## 2018-11-20 ENCOUNTER — Ambulatory Visit: Payer: Medicare Other | Admitting: Urology

## 2018-12-02 ENCOUNTER — Other Ambulatory Visit: Payer: Self-pay | Admitting: Cardiovascular Disease

## 2018-12-03 DIAGNOSIS — M5440 Lumbago with sciatica, unspecified side: Secondary | ICD-10-CM | POA: Diagnosis not present

## 2018-12-08 ENCOUNTER — Ambulatory Visit (HOSPITAL_COMMUNITY): Payer: Medicare Other

## 2018-12-09 ENCOUNTER — Ambulatory Visit (HOSPITAL_COMMUNITY): Payer: Medicare Other

## 2018-12-09 DIAGNOSIS — I2510 Atherosclerotic heart disease of native coronary artery without angina pectoris: Secondary | ICD-10-CM | POA: Diagnosis not present

## 2018-12-09 DIAGNOSIS — I739 Peripheral vascular disease, unspecified: Secondary | ICD-10-CM | POA: Diagnosis not present

## 2018-12-09 DIAGNOSIS — C3490 Malignant neoplasm of unspecified part of unspecified bronchus or lung: Secondary | ICD-10-CM | POA: Diagnosis not present

## 2018-12-09 DIAGNOSIS — N183 Chronic kidney disease, stage 3 (moderate): Secondary | ICD-10-CM | POA: Diagnosis not present

## 2018-12-09 DIAGNOSIS — Z0000 Encounter for general adult medical examination without abnormal findings: Secondary | ICD-10-CM | POA: Diagnosis not present

## 2018-12-09 DIAGNOSIS — E1122 Type 2 diabetes mellitus with diabetic chronic kidney disease: Secondary | ICD-10-CM | POA: Diagnosis not present

## 2018-12-09 DIAGNOSIS — E669 Obesity, unspecified: Secondary | ICD-10-CM | POA: Diagnosis not present

## 2018-12-09 DIAGNOSIS — M109 Gout, unspecified: Secondary | ICD-10-CM | POA: Diagnosis not present

## 2018-12-09 DIAGNOSIS — E782 Mixed hyperlipidemia: Secondary | ICD-10-CM | POA: Diagnosis not present

## 2018-12-09 DIAGNOSIS — I129 Hypertensive chronic kidney disease with stage 1 through stage 4 chronic kidney disease, or unspecified chronic kidney disease: Secondary | ICD-10-CM | POA: Diagnosis not present

## 2018-12-09 DIAGNOSIS — N2581 Secondary hyperparathyroidism of renal origin: Secondary | ICD-10-CM | POA: Diagnosis not present

## 2018-12-09 DIAGNOSIS — D631 Anemia in chronic kidney disease: Secondary | ICD-10-CM | POA: Diagnosis not present

## 2018-12-10 ENCOUNTER — Ambulatory Visit: Payer: Medicare Other | Admitting: Urology

## 2018-12-10 ENCOUNTER — Telehealth: Payer: Self-pay | Admitting: *Deleted

## 2018-12-10 ENCOUNTER — Encounter: Payer: Self-pay | Admitting: Cardiovascular Disease

## 2018-12-10 ENCOUNTER — Ambulatory Visit (INDEPENDENT_AMBULATORY_CARE_PROVIDER_SITE_OTHER): Payer: Medicare Other | Admitting: Cardiovascular Disease

## 2018-12-10 ENCOUNTER — Encounter (INDEPENDENT_AMBULATORY_CARE_PROVIDER_SITE_OTHER): Payer: Self-pay

## 2018-12-10 VITALS — BP 126/65 | HR 58 | Ht 72.0 in | Wt 216.0 lb

## 2018-12-10 DIAGNOSIS — I6523 Occlusion and stenosis of bilateral carotid arteries: Secondary | ICD-10-CM | POA: Diagnosis not present

## 2018-12-10 DIAGNOSIS — I255 Ischemic cardiomyopathy: Secondary | ICD-10-CM | POA: Diagnosis not present

## 2018-12-10 DIAGNOSIS — I739 Peripheral vascular disease, unspecified: Secondary | ICD-10-CM

## 2018-12-10 DIAGNOSIS — I10 Essential (primary) hypertension: Secondary | ICD-10-CM | POA: Diagnosis not present

## 2018-12-10 DIAGNOSIS — I2510 Atherosclerotic heart disease of native coronary artery without angina pectoris: Secondary | ICD-10-CM

## 2018-12-10 DIAGNOSIS — F17201 Nicotine dependence, unspecified, in remission: Secondary | ICD-10-CM | POA: Diagnosis not present

## 2018-12-10 MED ORDER — ASPIRIN EC 81 MG PO TBEC: 81.0000 mg | DELAYED_RELEASE_TABLET | Freq: Every day | ORAL | 3 refills | Status: AC

## 2018-12-10 NOTE — Progress Notes (Signed)
Chief Complaint  Patient presents with  . Follow-up    CAD   History of Present Illness: 68 yo male with history of CAD, DM, HTN, HLD, PAD, tobacco abuse here today for cardiac follow up. He has been followed in the past by Dr. Lia Foyer. He had an angioplasty of the LAD in the early 1990s with directional atherectomy of the LAD in 1994. Cypher DES placed in the OM in 2003. Cypher DES placed in the LAD in 2004. Last cath December 2010 with patent LAD and OM stents, totally occluded RCA. The LAD had a mid 60% stenosis. The diagonal had a 50% stenosis. He has done well with medical therapy since then. His PAD is followed in [facility] office. Carotid dopplers May 2016 with less than 49% bilateral ICA stenosis. ABI January 2017 0.79 right, 0.53 left. Echo 11/08/13 with LVEF=55%, severe LVH, moderate LAE. I arranged a stress myoview in December 2015 and he cancelled. He has been diagnosed with squamous cell lung cancer in 2019. This was being followed at the [facility]. Dr. Servando Snare did a biopsy. He has been treated with radiation therapy and is now done with this. He was dyspneic in December 2019 and hypoxic. V/Q scan with possible left PE. He was started on Eliquis.   He is here today for follow up. The patient denies any chest pain, palpitations, lower extremity edema, orthopnea, PND, dizziness, near syncope or syncope. No dyspnea since he has been treated for his PE. He feels well overall.    Primary Care Physician: [name], MD   Past Medical History:  Diagnosis Date  . CAD (coronary artery disease)   . Cancer ([facility])    skin cancer face  . COPD (chronic obstructive pulmonary disease) ([place])   . Coronary artery disease    PCA 1994l; PCI august 2003 of an obtust marginal. Catheterization at the time reveal: left main normal; LAD 50-75% between first and second diagonals, 50% after lesion; left circumflex 95% in obtuse marg. RCA 100% proximal.  . Diabetes mellitus    type 2  . Hyperlipemia   . Hypertension    . Kidney disease    chronic; stage 2 followed By Dr Deterding  . Myocardial infarction ([facility])   . Osteoarthritis   . Peripheral vascular disease ([facility])   . Tobacco abuse     Past Surgical History:  Procedure Laterality Date  . APPENDECTOMY    . CHOLECYSTECTOMY    . CORONARY ANGIOPLASTY     stent times 2, last cath 3 years ago  . FUDUCIAL PLACEMENT N/A 06/09/2018   Procedure: possible, PLACEMENT OF FUDUCIAL;  Surgeon: Grace Isaac, MD;  Location: [facility];  Service: Thoracic;  Laterality: N/A;  . NERVE EXPLORATION  07/29/2012   Procedure: NERVE EXPLORATION;  Surgeon: Schuyler Amor, MD;  Location: [facility];  Service: Orthopedics;  Laterality: Left;  Exploration /repair nerve and tendon left thumb  . PERIPHERAL VASCULAR CATHETERIZATION N/A 04/26/2015   Procedure: Abdominal Aortogram;  Surgeon: Rosetta Posner, MD;  Location: [facility];  Service: Cardiovascular;  Laterality: N/A;  . VIDEO BRONCHOSCOPY N/A 06/09/2018   Procedure: VIDEO BRONCHOSCOPY;  Surgeon: Grace Isaac, MD;  Location: [facility];  Service: Thoracic;  Laterality: N/A;  . VIDEO BRONCHOSCOPY WITH ENDOBRONCHIAL NAVIGATION N/A 06/09/2018   Procedure: VIDEO BRONCHOSCOPY WITH ENDOBRONCHIAL NAVIGATION;  Surgeon: Grace Isaac, MD;  Location: [facility];  Service: Thoracic;  Laterality: N/A;    Current Outpatient Medications  Medication Sig  Dispense Refill  . acetaminophen (TYLENOL) 500 MG tablet Take 1,000 mg by mouth daily as needed for pain.    [name] allopurinol (ZYLOPRIM) 100 MG tablet Take 100 mg by mouth daily. Taking 100 mg tablets daily for one week starting 08-15-18 then take two 100 mg tablets daily    . amLODipine (NORVASC) 10 MG tablet Take 10 mg by mouth daily.      . benazepril (LOTENSIN) 40 MG tablet Take 40 mg by mouth 2 (two) times daily.     . calcitRIOL (ROCALTROL) 0.25 MCG capsule Take by mouth daily.  6  . Cinnamon 500 MG capsule Take 500 mg by mouth daily.    . diclofenac sodium  (VOLTAREN) 1 % GEL Apply 2 g topically 4 (four) times daily. Rub into affected area of foot 2 to 4 times daily 100 g 2  . ELIQUIS 5 MG TABS tablet Take 1 tablet by mouth 2 (two) times daily.    . furosemide (LASIX) 80 MG tablet Take 40 mg by mouth 2 (two) times daily.     . insulin lispro (HUMALOG) 100 UNIT/ML injection Inject 11 Units into the skin 2 (two) times daily before lunch and supper.     . losartan (COZAAR) 100 MG tablet Take 100 mg by mouth 2 (two) times daily.     . metoprolol (LOPRESSOR) 100 MG tablet Take 100 mg by mouth 2 (two) times daily.      . mirtazapine (REMERON) 15 MG tablet Take 15 mg by mouth at bedtime.    [facility]  Scar Cream -  Verapamil 10%, Pentoxifylline 5% Apply 1-2 grams to affected area 3-4 times daily Qty. 120 gm 3 refills    . TOUJEO SOLOSTAR 300 UNIT/ML SOPN Inject 28 Units into the skin daily before breakfast.     . TURMERIC PO Take 400 mg by mouth 2 (two) times daily.    [name] aspirin EC 81 MG tablet Take 1 tablet (81 mg total) by mouth daily. 90 tablet 3   No current facility-administered medications for this visit.     Allergies  Allergen Reactions  . Penicillins Other (See Comments)    UNSPECIFIED CHILDHOOD REACTION Has patient had PCN reaction causing immediate rash, facial/tongue/throat swelling, SOB or lightheadedness with hypotension: Unknown Has patient had a PCN reaction causing severe rash involving mucus membranes or skin necrosis: Unknown Has patient had a PCN reaction that required hospitalization: No Has patient had a PCN reaction occurring within the last 10 years: No If all of the above answers are "NO", then may proceed with Cephalosporin use.     Social History   Socioeconomic History  . Marital status: Married    Spouse name: Not on file  . Number of children: 3  . Years of education: Not on file  . Highest education level: Not on file  Occupational History  . Not on file  Social Needs  .  Financial resource strain: Not on file  . Food insecurity:    Worry: Not on file    Inability: Not on file  . Transportation needs:    Medical: Not on file    Non-medical: Not on file  Tobacco Use  . Smoking status: Former Smoker    Packs/day: 0.50    Years: 50.00    Pack years: 25.00    Types: Cigarettes    Last attempt to quit: 05/09/2015    Years since quitting: 3.5  . Smokeless tobacco: Former Systems developer  Types: Sarina Ser    Quit date: 2016  Substance and Sexual Activity  . Alcohol use: No    Alcohol/week: 0.0 standard drinks  . Drug use: No  . Sexual activity: Not on file  Lifestyle  . Physical activity:    Days per week: Not on file    Minutes per session: Not on file  . Stress: Not on file  Relationships  . Social connections:    Talks on phone: Not on file    Gets together: Not on file    Attends religious service: Not on file    Active member of club or organization: Not on file    Attends meetings of clubs or organizations: Not on file    Relationship status: Not on file  . Intimate partner violence:    Fear of current or ex partner: Not on file    Emotionally abused: Not on file    Physically abused: Not on file    Forced sexual activity: Not on file  Other Topics Concern  . Not on file  Social History Narrative   Has a 40-pack-year smoking history and currently smokes 1/2 pack per day.    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    Family History  Problem Relation Age of Onset  . Coronary artery disease Mother   . Heart disease Mother   . Cancer Father   . Stroke Other   . Heart failure Other   . Pulmonary embolism Other   . Diabetes Brother   . Hypertension Brother   . Heart attack Brother   . Peripheral vascular disease Brother   . Heart disease Brother   . Hypertension Sister     Review of Systems:  As stated in the HPI and otherwise negative.   BP 126/65   Pulse (!) 58   Ht 6' (1.829 m)   Wt 216 lb (98 kg)   SpO2 96%   BMI  29.29 kg/m   Physical Examination: General: Well developed, well nourished, NAD  HEENT: OP clear, mucus membranes moist  SKIN: warm, dry. No rashes. Neuro: No focal deficits  Musculoskeletal: Muscle strength 5/5 all ext  Psychiatric: Mood and affect normal  Neck: No JVD, no carotid bruits, no thyromegaly, no lymphadenopathy.  Lungs:Clear bilaterally, no wheezes, rhonci, crackles Cardiovascular: Regular rate and rhythm. No murmurs, gallops or rubs. Abdomen:Soft. Bowel sounds present. Non-tender.  Extremities: No lower extremity edema. Pulses are 2 + in the bilateral DP/PT.  Echo 11/08/13: Left ventricle: Inferobasal hypokinesis The cavity size was normal. Wall thickness was increased in a pattern of severe LVH. The estimated ejection fraction was 55%. - Aortic valve: There was mild stenosis. - Left atrium: The atrium was moderately dilated. - Atrial septum: No defect or patent foramen ovale was Identified.  EKG:  EKG is ordered today. The ekg ordered today demonstrates sinus bradycardia with rate 58 bpm. IVCD  Recent Labs: 06/09/2018: ALT 16; BUN 50; Creatinine, Ser 2.27; Hemoglobin 14.9; Platelets 188; Potassium 4.6; Sodium 142   Lipid Panel   Wt Readings from Last 3 Encounters:  12/10/18 216 lb (98 kg)  08/19/18 214 lb 6.4 oz (97.3 kg)  06/11/18 219 lb 8 oz (99.6 kg)     Other studies Reviewed: Additional studies/ records that were reviewed today include: . Review of the above records demonstrates:    Assessment and Plan:   1. CAD without angina: He is known to have moderate CAD with prior coronary stent placements. No chest pain. Since  he is now on Eliquis for PE, will stop Plavix and restart ASA 81 mg daily. Continue statin and beta blocker.  2. PAD: Followed in [facility] by Dr. Donnetta Hutching.   3. Ischemic cardiomyopathy: Last LVEF=55% December 2014. Will continue beta blocker and ARB. He is he also on benazepril per Nephrology.   4. HTN: Managed in primary care and  Nephrology. BP well controlled.   5. Carotid artery disease: Followed in [facility] but no carotid u/s since 2016. Right carotid bruit on exam. Will arrange carotid artery dopplers in our office and then send report to Dr. Donnetta Hutching.   6. Tobacco abuse, in remission: He has stopped smoking.   Current medicines are reviewed at length with the patient today.  The patient does not have concerns regarding medicines.  The following changes have been made:  no change  Labs/ tests ordered today include:   Orders Placed This Encounter  Procedures  . EKG 12-Lead     Disposition:   FU with me in 12  months   Signed, Lauree Chandler, MD 12/10/2018 12:28 PM    [facility] [place], [place], [place]  [address] Phone: [phone]; Fax: [phone]

## 2018-12-10 NOTE — Patient Instructions (Signed)
Medication Instructions:  1) DISCONTINUE Plavix 2) START Aspirin 81mg  once daily  If you need a refill on your cardiac medications before your next appointment, please call your pharmacy.   Lab work: None If you have labs (blood work) drawn today and your tests are completely normal, you will receive your results only by: [name] MyChart Message (if you have MyChart) OR . A paper copy in the mail If you have any lab test that is abnormal or we need to change your treatment, we will call you to review the results.  Testing/Procedures: Your physician has requested that you have a carotid duplex. This test is an ultrasound of the carotid arteries in your neck. It looks at blood flow through these arteries that supply the brain with blood. Allow one hour for this exam. There are no restrictions or special instructions.   Follow-Up: At [facility], you and your health needs are our priority.  As part of our continuing mission to provide you with exceptional heart care, we have created designated Provider Care Teams.  These Care Teams include your primary Cardiologist (physician) and Advanced Practice Providers (APPs -  Physician Assistants and Nurse Practitioners) who all work together to provide you with the care you need, when you need it. You will need a follow up appointment in 12 months.  Please call our office 2 months in advance to schedule this appointment.  You may see Dr.McAlhany or one of the following Advanced Practice Providers on your designated Care Team:   Ensign, PA-C Melina Copa, PA-C . Ermalinda Barrios, PA-C  Any Other Special Instructions Will Be Listed Below (If Applicable).

## 2018-12-10 NOTE — Telephone Encounter (Signed)
Called patient to inquire about CT, patient's wife- Otila Kluver states that they had Ct - 2 wks. ago @ the [place], informed Dr. Johny Shears PA- [facility] Cal[facility]ron

## 2018-12-17 DIAGNOSIS — M6281 Muscle weakness (generalized): Secondary | ICD-10-CM | POA: Diagnosis not present

## 2018-12-17 DIAGNOSIS — M79605 Pain in left leg: Secondary | ICD-10-CM | POA: Diagnosis not present

## 2018-12-17 DIAGNOSIS — M545 Low back pain: Secondary | ICD-10-CM | POA: Diagnosis not present

## 2018-12-17 DIAGNOSIS — M5440 Lumbago with sciatica, unspecified side: Secondary | ICD-10-CM | POA: Diagnosis not present

## 2018-12-22 DIAGNOSIS — I10 Essential (primary) hypertension: Secondary | ICD-10-CM | POA: Diagnosis not present

## 2018-12-22 DIAGNOSIS — E1129 Type 2 diabetes mellitus with other diabetic kidney complication: Secondary | ICD-10-CM | POA: Diagnosis not present

## 2018-12-22 DIAGNOSIS — N183 Chronic kidney disease, stage 3 (moderate): Secondary | ICD-10-CM | POA: Diagnosis not present

## 2018-12-22 DIAGNOSIS — Z794 Long term (current) use of insulin: Secondary | ICD-10-CM | POA: Diagnosis not present

## 2018-12-30 DIAGNOSIS — M5440 Lumbago with sciatica, unspecified side: Secondary | ICD-10-CM | POA: Diagnosis not present

## 2018-12-30 DIAGNOSIS — M545 Low back pain: Secondary | ICD-10-CM | POA: Diagnosis not present

## 2018-12-30 DIAGNOSIS — M79605 Pain in left leg: Secondary | ICD-10-CM | POA: Diagnosis not present

## 2018-12-30 DIAGNOSIS — M6281 Muscle weakness (generalized): Secondary | ICD-10-CM | POA: Diagnosis not present

## 2018-12-31 DIAGNOSIS — R82998 Other abnormal findings in urine: Secondary | ICD-10-CM | POA: Diagnosis not present

## 2018-12-31 DIAGNOSIS — E1129 Type 2 diabetes mellitus with other diabetic kidney complication: Secondary | ICD-10-CM | POA: Diagnosis not present

## 2018-12-31 DIAGNOSIS — E7849 Other hyperlipidemia: Secondary | ICD-10-CM | POA: Diagnosis not present

## 2018-12-31 DIAGNOSIS — E538 Deficiency of other specified B group vitamins: Secondary | ICD-10-CM | POA: Diagnosis not present

## 2018-12-31 DIAGNOSIS — Z125 Encounter for screening for malignant neoplasm of prostate: Secondary | ICD-10-CM | POA: Diagnosis not present

## 2019-01-06 DIAGNOSIS — M6281 Muscle weakness (generalized): Secondary | ICD-10-CM | POA: Diagnosis not present

## 2019-01-06 DIAGNOSIS — M5440 Lumbago with sciatica, unspecified side: Secondary | ICD-10-CM | POA: Diagnosis not present

## 2019-01-06 DIAGNOSIS — M545 Low back pain: Secondary | ICD-10-CM | POA: Diagnosis not present

## 2019-01-06 DIAGNOSIS — M79605 Pain in left leg: Secondary | ICD-10-CM | POA: Diagnosis not present

## 2019-01-07 DIAGNOSIS — I2699 Other pulmonary embolism without acute cor pulmonale: Secondary | ICD-10-CM | POA: Diagnosis not present

## 2019-01-07 DIAGNOSIS — N184 Chronic kidney disease, stage 4 (severe): Secondary | ICD-10-CM | POA: Diagnosis not present

## 2019-01-07 DIAGNOSIS — Z1331 Encounter for screening for depression: Secondary | ICD-10-CM | POA: Diagnosis not present

## 2019-01-07 DIAGNOSIS — C3490 Malignant neoplasm of unspecified part of unspecified bronchus or lung: Secondary | ICD-10-CM | POA: Diagnosis not present

## 2019-01-07 DIAGNOSIS — M5136 Other intervertebral disc degeneration, lumbar region: Secondary | ICD-10-CM | POA: Diagnosis not present

## 2019-01-07 DIAGNOSIS — E1129 Type 2 diabetes mellitus with other diabetic kidney complication: Secondary | ICD-10-CM | POA: Diagnosis not present

## 2019-01-07 DIAGNOSIS — Z794 Long term (current) use of insulin: Secondary | ICD-10-CM | POA: Diagnosis not present

## 2019-01-07 DIAGNOSIS — M79671 Pain in right foot: Secondary | ICD-10-CM | POA: Diagnosis not present

## 2019-01-07 DIAGNOSIS — E1151 Type 2 diabetes mellitus with diabetic peripheral angiopathy without gangrene: Secondary | ICD-10-CM | POA: Diagnosis not present

## 2019-01-07 DIAGNOSIS — Z0000 Encounter for general adult medical examination without abnormal findings: Secondary | ICD-10-CM | POA: Diagnosis not present

## 2019-01-07 DIAGNOSIS — J189 Pneumonia, unspecified organism: Secondary | ICD-10-CM | POA: Diagnosis not present

## 2019-01-07 DIAGNOSIS — Z7901 Long term (current) use of anticoagulants: Secondary | ICD-10-CM | POA: Diagnosis not present

## 2019-01-20 ENCOUNTER — Ambulatory Visit (HOSPITAL_COMMUNITY)
Admission: RE | Admit: 2019-01-20 | Payer: Medicare Other | Source: Ambulatory Visit | Attending: Cardiovascular Disease | Admitting: Cardiovascular Disease

## 2019-01-21 ENCOUNTER — Other Ambulatory Visit: Payer: Self-pay

## 2019-01-21 ENCOUNTER — Ambulatory Visit (HOSPITAL_COMMUNITY)
Admission: RE | Admit: 2019-01-21 | Discharge: 2019-01-21 | Disposition: A | Discharge status: Home / Self Care | Payer: Medicare Other | Source: Ambulatory Visit | Attending: Cardiovascular Disease | Admitting: Cardiovascular Disease

## 2019-01-21 DIAGNOSIS — I6523 Occlusion and stenosis of bilateral carotid arteries: Secondary | ICD-10-CM | POA: Diagnosis not present

## 2019-02-02 DIAGNOSIS — R05 Cough: Secondary | ICD-10-CM | POA: Diagnosis not present

## 2019-02-02 DIAGNOSIS — N184 Chronic kidney disease, stage 4 (severe): Secondary | ICD-10-CM | POA: Diagnosis not present

## 2019-02-02 DIAGNOSIS — J189 Pneumonia, unspecified organism: Secondary | ICD-10-CM | POA: Diagnosis not present

## 2019-02-02 DIAGNOSIS — I129 Hypertensive chronic kidney disease with stage 1 through stage 4 chronic kidney disease, or unspecified chronic kidney disease: Secondary | ICD-10-CM | POA: Diagnosis not present

## 2019-02-02 DIAGNOSIS — C3490 Malignant neoplasm of unspecified part of unspecified bronchus or lung: Secondary | ICD-10-CM | POA: Diagnosis not present

## 2019-02-22 ENCOUNTER — Other Ambulatory Visit: Payer: Self-pay | Admitting: Podiatry

## 2019-02-24 DIAGNOSIS — I129 Hypertensive chronic kidney disease with stage 1 through stage 4 chronic kidney disease, or unspecified chronic kidney disease: Secondary | ICD-10-CM | POA: Diagnosis not present

## 2019-02-24 DIAGNOSIS — R05 Cough: Secondary | ICD-10-CM | POA: Diagnosis not present

## 2019-02-24 DIAGNOSIS — N184 Chronic kidney disease, stage 4 (severe): Secondary | ICD-10-CM | POA: Diagnosis not present

## 2019-02-24 DIAGNOSIS — J189 Pneumonia, unspecified organism: Secondary | ICD-10-CM | POA: Diagnosis not present

## 2019-02-24 DIAGNOSIS — Z20818 Contact with and (suspected) exposure to other bacterial communicable diseases: Secondary | ICD-10-CM | POA: Diagnosis not present

## 2019-03-01 ENCOUNTER — Encounter: Payer: Self-pay | Admitting: Primary Care

## 2019-03-10 DIAGNOSIS — N184 Chronic kidney disease, stage 4 (severe): Secondary | ICD-10-CM | POA: Diagnosis not present

## 2019-03-10 DIAGNOSIS — J181 Lobar pneumonia, unspecified organism: Secondary | ICD-10-CM | POA: Diagnosis not present

## 2019-03-10 DIAGNOSIS — J449 Chronic obstructive pulmonary disease, unspecified: Secondary | ICD-10-CM | POA: Diagnosis not present

## 2019-03-10 DIAGNOSIS — R509 Fever, unspecified: Secondary | ICD-10-CM | POA: Diagnosis not present

## 2019-03-10 DIAGNOSIS — Z20818 Contact with and (suspected) exposure to other bacterial communicable diseases: Secondary | ICD-10-CM | POA: Diagnosis not present

## 2019-03-10 DIAGNOSIS — R05 Cough: Secondary | ICD-10-CM | POA: Diagnosis not present

## 2019-03-12 DIAGNOSIS — I639 Cerebral infarction, unspecified: Secondary | ICD-10-CM

## 2019-03-12 DIAGNOSIS — D682 Hereditary deficiency of other clotting factors: Secondary | ICD-10-CM

## 2019-03-12 HISTORY — DX: Cerebral infarction, unspecified: I63.9

## 2019-03-12 HISTORY — DX: Hereditary deficiency of other clotting factors: D68.2

## 2019-03-15 ENCOUNTER — Emergency Department (HOSPITAL_COMMUNITY): Payer: Medicare Other

## 2019-03-15 ENCOUNTER — Other Ambulatory Visit: Payer: Self-pay

## 2019-03-15 ENCOUNTER — Inpatient Hospital Stay (HOSPITAL_COMMUNITY)
Admission: EM | Admit: 2019-03-15 | Discharge: 2019-03-19 | DRG: 064 | Disposition: A | Discharge status: Rehabilitation Facility | Payer: Medicare Other | Attending: Internal Medicine | Admitting: Internal Medicine

## 2019-03-15 ENCOUNTER — Encounter (HOSPITAL_COMMUNITY): Payer: Self-pay | Admitting: Emergency Medicine

## 2019-03-15 ENCOUNTER — Inpatient Hospital Stay (HOSPITAL_COMMUNITY): Payer: Medicare Other

## 2019-03-15 DIAGNOSIS — Z7982 Long term (current) use of aspirin: Secondary | ICD-10-CM

## 2019-03-15 DIAGNOSIS — Z8249 Family history of ischemic heart disease and other diseases of the circulatory system: Secondary | ICD-10-CM | POA: Diagnosis not present

## 2019-03-15 DIAGNOSIS — R471 Dysarthria and anarthria: Secondary | ICD-10-CM | POA: Diagnosis present

## 2019-03-15 DIAGNOSIS — Z823 Family history of stroke: Secondary | ICD-10-CM | POA: Diagnosis not present

## 2019-03-15 DIAGNOSIS — R0902 Hypoxemia: Secondary | ICD-10-CM | POA: Diagnosis not present

## 2019-03-15 DIAGNOSIS — E1122 Type 2 diabetes mellitus with diabetic chronic kidney disease: Secondary | ICD-10-CM | POA: Diagnosis present

## 2019-03-15 DIAGNOSIS — R05 Cough: Secondary | ICD-10-CM | POA: Diagnosis not present

## 2019-03-15 DIAGNOSIS — Z955 Presence of coronary angioplasty implant and graft: Secondary | ICD-10-CM

## 2019-03-15 DIAGNOSIS — Z7901 Long term (current) use of anticoagulants: Secondary | ICD-10-CM

## 2019-03-15 DIAGNOSIS — I69328 Other speech and language deficits following cerebral infarction: Secondary | ICD-10-CM | POA: Diagnosis not present

## 2019-03-15 DIAGNOSIS — R112 Nausea with vomiting, unspecified: Secondary | ICD-10-CM | POA: Diagnosis not present

## 2019-03-15 DIAGNOSIS — Z79899 Other long term (current) drug therapy: Secondary | ICD-10-CM | POA: Diagnosis not present

## 2019-03-15 DIAGNOSIS — I63441 Cerebral infarction due to embolism of right cerebellar artery: Secondary | ICD-10-CM | POA: Diagnosis not present

## 2019-03-15 DIAGNOSIS — D6869 Other thrombophilia: Secondary | ICD-10-CM | POA: Diagnosis present

## 2019-03-15 DIAGNOSIS — C3490 Malignant neoplasm of unspecified part of unspecified bronchus or lung: Secondary | ICD-10-CM | POA: Diagnosis not present

## 2019-03-15 DIAGNOSIS — Z1159 Encounter for screening for other viral diseases: Secondary | ICD-10-CM

## 2019-03-15 DIAGNOSIS — M1990 Unspecified osteoarthritis, unspecified site: Secondary | ICD-10-CM | POA: Diagnosis present

## 2019-03-15 DIAGNOSIS — K920 Hematemesis: Secondary | ICD-10-CM | POA: Diagnosis not present

## 2019-03-15 DIAGNOSIS — E785 Hyperlipidemia, unspecified: Secondary | ICD-10-CM | POA: Diagnosis present

## 2019-03-15 DIAGNOSIS — J189 Pneumonia, unspecified organism: Secondary | ICD-10-CM | POA: Diagnosis not present

## 2019-03-15 DIAGNOSIS — E7800 Pure hypercholesterolemia, unspecified: Secondary | ICD-10-CM | POA: Diagnosis not present

## 2019-03-15 DIAGNOSIS — Z86718 Personal history of other venous thrombosis and embolism: Secondary | ICD-10-CM | POA: Diagnosis not present

## 2019-03-15 DIAGNOSIS — Z833 Family history of diabetes mellitus: Secondary | ICD-10-CM

## 2019-03-15 DIAGNOSIS — C3492 Malignant neoplasm of unspecified part of left bronchus or lung: Secondary | ICD-10-CM | POA: Diagnosis present

## 2019-03-15 DIAGNOSIS — F17200 Nicotine dependence, unspecified, uncomplicated: Secondary | ICD-10-CM | POA: Diagnosis present

## 2019-03-15 DIAGNOSIS — I739 Peripheral vascular disease, unspecified: Secondary | ICD-10-CM | POA: Diagnosis present

## 2019-03-15 DIAGNOSIS — Z880 Allergy status to penicillin: Secondary | ICD-10-CM | POA: Diagnosis not present

## 2019-03-15 DIAGNOSIS — R918 Other nonspecific abnormal finding of lung field: Secondary | ICD-10-CM | POA: Diagnosis not present

## 2019-03-15 DIAGNOSIS — I252 Old myocardial infarction: Secondary | ICD-10-CM | POA: Diagnosis not present

## 2019-03-15 DIAGNOSIS — I639 Cerebral infarction, unspecified: Secondary | ICD-10-CM | POA: Diagnosis present

## 2019-03-15 DIAGNOSIS — R270 Ataxia, unspecified: Secondary | ICD-10-CM | POA: Diagnosis present

## 2019-03-15 DIAGNOSIS — R531 Weakness: Secondary | ICD-10-CM | POA: Diagnosis not present

## 2019-03-15 DIAGNOSIS — Z86711 Personal history of pulmonary embolism: Secondary | ICD-10-CM

## 2019-03-15 DIAGNOSIS — R42 Dizziness and giddiness: Secondary | ICD-10-CM | POA: Diagnosis not present

## 2019-03-15 DIAGNOSIS — Z87891 Personal history of nicotine dependence: Secondary | ICD-10-CM | POA: Diagnosis not present

## 2019-03-15 DIAGNOSIS — R29701 NIHSS score 1: Secondary | ICD-10-CM | POA: Diagnosis present

## 2019-03-15 DIAGNOSIS — I998 Other disorder of circulatory system: Secondary | ICD-10-CM | POA: Diagnosis present

## 2019-03-15 DIAGNOSIS — I82442 Acute embolism and thrombosis of left tibial vein: Secondary | ICD-10-CM | POA: Diagnosis not present

## 2019-03-15 DIAGNOSIS — E1151 Type 2 diabetes mellitus with diabetic peripheral angiopathy without gangrene: Secondary | ICD-10-CM | POA: Diagnosis present

## 2019-03-15 DIAGNOSIS — Z794 Long term (current) use of insulin: Secondary | ICD-10-CM | POA: Diagnosis not present

## 2019-03-15 DIAGNOSIS — I69393 Ataxia following cerebral infarction: Secondary | ICD-10-CM | POA: Diagnosis present

## 2019-03-15 DIAGNOSIS — Z01810 Encounter for preprocedural cardiovascular examination: Secondary | ICD-10-CM | POA: Diagnosis not present

## 2019-03-15 DIAGNOSIS — I10 Essential (primary) hypertension: Secondary | ICD-10-CM | POA: Diagnosis not present

## 2019-03-15 DIAGNOSIS — C3491 Malignant neoplasm of unspecified part of right bronchus or lung: Secondary | ICD-10-CM | POA: Diagnosis present

## 2019-03-15 DIAGNOSIS — R9082 White matter disease, unspecified: Secondary | ICD-10-CM | POA: Diagnosis present

## 2019-03-15 DIAGNOSIS — N179 Acute kidney failure, unspecified: Secondary | ICD-10-CM | POA: Diagnosis present

## 2019-03-15 DIAGNOSIS — R042 Hemoptysis: Secondary | ICD-10-CM | POA: Diagnosis not present

## 2019-03-15 DIAGNOSIS — I7774 Dissection of vertebral artery: Secondary | ICD-10-CM | POA: Diagnosis present

## 2019-03-15 DIAGNOSIS — Y95 Nosocomial condition: Secondary | ICD-10-CM | POA: Diagnosis not present

## 2019-03-15 DIAGNOSIS — Y842 Radiological procedure and radiotherapy as the cause of abnormal reaction of the patient, or of later complication, without mention of misadventure at the time of the procedure: Secondary | ICD-10-CM | POA: Diagnosis present

## 2019-03-15 DIAGNOSIS — Z923 Personal history of irradiation: Secondary | ICD-10-CM

## 2019-03-15 DIAGNOSIS — Z85828 Personal history of other malignant neoplasm of skin: Secondary | ICD-10-CM

## 2019-03-15 DIAGNOSIS — J9811 Atelectasis: Secondary | ICD-10-CM | POA: Diagnosis not present

## 2019-03-15 DIAGNOSIS — E1165 Type 2 diabetes mellitus with hyperglycemia: Secondary | ICD-10-CM | POA: Diagnosis not present

## 2019-03-15 DIAGNOSIS — J449 Chronic obstructive pulmonary disease, unspecified: Secondary | ICD-10-CM | POA: Diagnosis present

## 2019-03-15 DIAGNOSIS — I69392 Facial weakness following cerebral infarction: Secondary | ICD-10-CM | POA: Diagnosis not present

## 2019-03-15 DIAGNOSIS — E278 Other specified disorders of adrenal gland: Secondary | ICD-10-CM | POA: Diagnosis not present

## 2019-03-15 DIAGNOSIS — I129 Hypertensive chronic kidney disease with stage 1 through stage 4 chronic kidney disease, or unspecified chronic kidney disease: Secondary | ICD-10-CM | POA: Diagnosis present

## 2019-03-15 DIAGNOSIS — J984 Other disorders of lung: Secondary | ICD-10-CM | POA: Diagnosis present

## 2019-03-15 DIAGNOSIS — J90 Pleural effusion, not elsewhere classified: Secondary | ICD-10-CM | POA: Diagnosis not present

## 2019-03-15 DIAGNOSIS — I69322 Dysarthria following cerebral infarction: Secondary | ICD-10-CM | POA: Diagnosis not present

## 2019-03-15 DIAGNOSIS — J9819 Other pulmonary collapse: Secondary | ICD-10-CM | POA: Diagnosis present

## 2019-03-15 DIAGNOSIS — G464 Cerebellar stroke syndrome: Secondary | ICD-10-CM | POA: Diagnosis not present

## 2019-03-15 DIAGNOSIS — R0603 Acute respiratory distress: Secondary | ICD-10-CM | POA: Diagnosis present

## 2019-03-15 DIAGNOSIS — Z9049 Acquired absence of other specified parts of digestive tract: Secondary | ICD-10-CM | POA: Diagnosis not present

## 2019-03-15 DIAGNOSIS — E119 Type 2 diabetes mellitus without complications: Secondary | ICD-10-CM | POA: Diagnosis not present

## 2019-03-15 DIAGNOSIS — Z85118 Personal history of other malignant neoplasm of bronchus and lung: Secondary | ICD-10-CM | POA: Diagnosis not present

## 2019-03-15 DIAGNOSIS — R5381 Other malaise: Secondary | ICD-10-CM | POA: Diagnosis present

## 2019-03-15 DIAGNOSIS — N183 Chronic kidney disease, stage 3 unspecified: Secondary | ICD-10-CM | POA: Diagnosis present

## 2019-03-15 DIAGNOSIS — I2510 Atherosclerotic heart disease of native coronary artery without angina pectoris: Secondary | ICD-10-CM | POA: Diagnosis present

## 2019-03-15 DIAGNOSIS — I69351 Hemiplegia and hemiparesis following cerebral infarction affecting right dominant side: Secondary | ICD-10-CM | POA: Diagnosis not present

## 2019-03-15 DIAGNOSIS — J181 Lobar pneumonia, unspecified organism: Secondary | ICD-10-CM

## 2019-03-15 DIAGNOSIS — R911 Solitary pulmonary nodule: Secondary | ICD-10-CM | POA: Diagnosis not present

## 2019-03-15 LAB — COMPREHENSIVE METABOLIC PANEL
ALT: 24 U/L (ref 0–44)
AST: 25 U/L (ref 15–41)
Albumin: 2.7 g/dL — ABNORMAL LOW (ref 3.5–5.0)
Alkaline Phosphatase: 57 U/L (ref 38–126)
Anion gap: 13 (ref 5–15)
BUN: 53 mg/dL — ABNORMAL HIGH (ref 8–23)
CO2: 20 mmol/L — ABNORMAL LOW (ref 22–32)
Calcium: 9 mg/dL (ref 8.9–10.3)
Chloride: 106 mmol/L (ref 98–111)
Creatinine, Ser: 2.05 mg/dL — ABNORMAL HIGH (ref 0.61–1.24)
GFR calc Af Amer: 37 mL/min — ABNORMAL LOW (ref >60)
GFR calc non Af Amer: 32 mL/min — ABNORMAL LOW (ref >60)
Glucose, Bld: 318 mg/dL — ABNORMAL HIGH (ref 70–99)
Potassium: 4.9 mmol/L (ref 3.5–5.1)
Sodium: 139 mmol/L (ref 135–145)
Total Bilirubin: 0.5 mg/dL (ref 0.3–1.2)
Total Protein: 7.1 g/dL (ref 6.5–8.1)

## 2019-03-15 LAB — CBC WITH DIFFERENTIAL/PLATELET
Abs Immature Granulocytes: 0.05 10*3/uL (ref 0.00–0.07)
Basophils Absolute: 0.1 10*3/uL (ref 0.0–0.1)
Basophils Relative: 1 %
Eosinophils Absolute: 0 10*3/uL (ref 0.0–0.5)
Eosinophils Relative: 0 %
HCT: 45.6 % (ref 39.0–52.0)
Hemoglobin: 14.1 g/dL (ref 13.0–17.0)
Immature Granulocytes: 1 %
Lymphocytes Relative: 7 %
Lymphs Abs: 0.6 10*3/uL — ABNORMAL LOW (ref 0.7–4.0)
MCH: 26.8 pg (ref 26.0–34.0)
MCHC: 30.9 g/dL (ref 30.0–36.0)
MCV: 86.5 fL (ref 80.0–100.0)
Monocytes Absolute: 0.3 10*3/uL (ref 0.1–1.0)
Monocytes Relative: 3 %
Neutro Abs: 8.6 10*3/uL — ABNORMAL HIGH (ref 1.7–7.7)
Neutrophils Relative %: 88 %
Platelets: 277 10*3/uL (ref 150–400)
RBC: 5.27 MIL/uL (ref 4.22–5.81)
RDW: 14.2 % (ref 11.5–15.5)
WBC: 9.6 10*3/uL (ref 4.0–10.5)
nRBC: 0 % (ref 0.0–0.2)

## 2019-03-15 LAB — POC OCCULT BLOOD, ED: Fecal Occult Bld: NEGATIVE

## 2019-03-15 LAB — PROTIME-INR
INR: 1.5 — ABNORMAL HIGH (ref 0.8–1.2)
Prothrombin Time: 17.8 seconds — ABNORMAL HIGH (ref 11.4–15.2)

## 2019-03-15 LAB — TROPONIN I: Troponin I: <0.03 ng/mL (ref <0.03)

## 2019-03-15 LAB — SARS CORONAVIRUS 2 BY RT PCR (HOSPITAL ORDER, PERFORMED IN ~~LOC~~ HOSPITAL LAB): SARS Coronavirus 2: NEGATIVE

## 2019-03-15 LAB — LIPASE, BLOOD: Lipase: 30 U/L (ref 11–51)

## 2019-03-15 MED ORDER — ONDANSETRON HCL 4 MG/2ML IJ SOLN
4.0000 mg | Freq: Once | INTRAMUSCULAR | Status: AC
  Administered 2019-03-15: 4 mg via INTRAVENOUS
  Filled 2019-03-15: qty 2

## 2019-03-15 MED ORDER — ALLOPURINOL 100 MG PO TABS
100.0000 mg | ORAL_TABLET | Freq: Every day | ORAL | Status: DC
  Administered 2019-03-15 – 2019-03-18 (×4): 100 mg via ORAL
  Filled 2019-03-15 (×5): qty 1

## 2019-03-15 MED ORDER — IPRATROPIUM-ALBUTEROL 0.5-2.5 (3) MG/3ML IN SOLN
3.0000 mL | Freq: Three times a day (TID) | RESPIRATORY_TRACT | Status: DC
  Administered 2019-03-16 – 2019-03-19 (×11): 3 mL via RESPIRATORY_TRACT
  Filled 2019-03-15 (×11): qty 3

## 2019-03-15 MED ORDER — FUROSEMIDE 40 MG PO TABS
40.0000 mg | ORAL_TABLET | Freq: Every day | ORAL | Status: DC
  Administered 2019-03-16 – 2019-03-17 (×2): 40 mg via ORAL
  Filled 2019-03-15 (×2): qty 1

## 2019-03-15 MED ORDER — LEVOFLOXACIN 250 MG PO TABS
250.0000 mg | ORAL_TABLET | Freq: Every day | ORAL | Status: AC
  Administered 2019-03-15 – 2019-03-16 (×2): 250 mg via ORAL
  Filled 2019-03-15 (×2): qty 1

## 2019-03-15 MED ORDER — SENNOSIDES-DOCUSATE SODIUM 8.6-50 MG PO TABS: 1.0000 | ORAL_TABLET | Freq: Every evening | ORAL | Status: DC | PRN

## 2019-03-15 MED ORDER — STROKE: EARLY STAGES OF RECOVERY BOOK
Freq: Once | Status: AC
  Administered 2019-03-15: 20:00:00
  Filled 2019-03-15: qty 1

## 2019-03-15 MED ORDER — ACETAMINOPHEN 160 MG/5ML PO SOLN: 650.0000 mg | ORAL | Status: DC | PRN

## 2019-03-15 MED ORDER — IPRATROPIUM-ALBUTEROL 0.5-2.5 (3) MG/3ML IN SOLN
3.0000 mL | Freq: Four times a day (QID) | RESPIRATORY_TRACT | Status: DC
  Administered 2019-03-15: 3 mL via RESPIRATORY_TRACT
  Filled 2019-03-15: qty 3

## 2019-03-15 MED ORDER — AMLODIPINE BESYLATE 10 MG PO TABS
10.0000 mg | ORAL_TABLET | Freq: Every day | ORAL | Status: DC
  Administered 2019-03-15 – 2019-03-19 (×5): 10 mg via ORAL
  Filled 2019-03-15: qty 2
  Filled 2019-03-15 (×4): qty 1

## 2019-03-15 MED ORDER — ASPIRIN EC 81 MG PO TBEC
81.0000 mg | DELAYED_RELEASE_TABLET | Freq: Every day | ORAL | Status: DC
  Administered 2019-03-16 – 2019-03-19 (×4): 81 mg via ORAL
  Filled 2019-03-15 (×4): qty 1

## 2019-03-15 MED ORDER — METOPROLOL TARTRATE 50 MG PO TABS
100.0000 mg | ORAL_TABLET | Freq: Two times a day (BID) | ORAL | Status: DC
  Administered 2019-03-15 – 2019-03-19 (×8): 100 mg via ORAL
  Filled 2019-03-15 (×9): qty 2

## 2019-03-15 MED ORDER — APIXABAN 5 MG PO TABS
5.0000 mg | ORAL_TABLET | Freq: Two times a day (BID) | ORAL | Status: DC
  Administered 2019-03-15 – 2019-03-18 (×6): 5 mg via ORAL
  Filled 2019-03-15 (×6): qty 1

## 2019-03-15 MED ORDER — BENZONATATE 100 MG PO CAPS
100.0000 mg | ORAL_CAPSULE | Freq: Three times a day (TID) | ORAL | Status: DC | PRN
  Administered 2019-03-18: 100 mg via ORAL
  Filled 2019-03-15: qty 1

## 2019-03-15 MED ORDER — ACETAMINOPHEN 325 MG PO TABS: 650.0000 mg | ORAL_TABLET | ORAL | Status: DC | PRN

## 2019-03-15 MED ORDER — SODIUM CHLORIDE 0.9 % IV SOLN
Freq: Once | INTRAVENOUS | Status: AC
  Administered 2019-03-15: 20:00:00 via INTRAVENOUS

## 2019-03-15 MED ORDER — SODIUM CHLORIDE 0.9 % IV BOLUS
500.0000 mL | Freq: Once | INTRAVENOUS | Status: AC
  Administered 2019-03-15: 500 mL via INTRAVENOUS

## 2019-03-15 MED ORDER — ACETAMINOPHEN 650 MG RE SUPP: 650.0000 mg | RECTAL | Status: DC | PRN

## 2019-03-15 MED ORDER — MIRTAZAPINE 15 MG PO TABS
15.0000 mg | ORAL_TABLET | Freq: Every day | ORAL | Status: DC
  Administered 2019-03-15 – 2019-03-18 (×4): 15 mg via ORAL
  Filled 2019-03-15 (×5): qty 1

## 2019-03-15 MED ORDER — ALBUTEROL SULFATE HFA 108 (90 BASE) MCG/ACT IN AERS
2.0000 | INHALATION_SPRAY | Freq: Once | RESPIRATORY_TRACT | Status: AC
  Administered 2019-03-15: 2 via RESPIRATORY_TRACT
  Filled 2019-03-15: qty 6.7

## 2019-03-15 MED ORDER — FUROSEMIDE 40 MG PO TABS
40.0000 mg | ORAL_TABLET | Freq: Two times a day (BID) | ORAL | Status: DC
  Administered 2019-03-15: 40 mg via ORAL
  Filled 2019-03-15: qty 2

## 2019-03-15 MED ORDER — BENAZEPRIL HCL 20 MG PO TABS
40.0000 mg | ORAL_TABLET | Freq: Every day | ORAL | Status: DC
  Administered 2019-03-15 – 2019-03-18 (×4): 40 mg via ORAL
  Filled 2019-03-15 (×5): qty 2

## 2019-03-15 NOTE — H&P (Signed)
.  History and Physical    Travis [name] GUR:[id number] DOB: 09-Oct-1951 DOA: 03/15/2019  PCP: [name], MD  Patient coming from: Home   Chief Complaint: Nausea, vomiting, dizziness  HPI: Travis [name] is a 68 y.o. male with medical history significant of DM2, HLD, COPD, CAD; presents with nausea, vomiting, and dizziness. He notes that at about 0100hrs on 03/15/2019 he felt acutely nauseous. As he tried to get up, he became dizzy and began to vomit. He had several episodes of emesis that were accompanied by uncontrolled coughing. The vomitus was dark, but no blood noted. He felt off balance and his right arm became weak. He also notes that he had problems with expressing his words that started around 2200hrs 03/14/2019. He denies a syncopal episode and also notes that no one in the house was awake to witness this event. He became concerned about his symptoms can came to [facility] ED.      ED Course: Pt evaled by ED staff. Noted to have acute CVA on imaging. Neurology and Hospital IM were called for admission.   Review of Systems: Denies CP, SOB, HA, palpitations, LOC, visual changes, fever, D, myalgias, seizure-like activity. Remainder of 10 point review of systems is otherwise negative for all not mentioned in HPI.    Past Medical History:  Diagnosis Date  . CAD (coronary artery disease)   . Cancer ([place])    skin cancer face  . COPD (chronic obstructive pulmonary disease) ([place])   . Coronary artery disease    PCA 1994l; PCI august 2003 of an obtust marginal. Catheterization at the time reveal: left main normal; LAD 50-75% between first and second diagonals, 50% after lesion; left circumflex 95% in obtuse marg. RCA 100% proximal.  . Diabetes mellitus    type 2  . Hyperlipemia   . Hypertension   . Kidney disease    chronic; stage 2 followed By Dr Deterding  . Myocardial infarction ([place])   . Osteoarthritis   . Peripheral vascular disease ([place])   . Tobacco abuse     Past Surgical History:   Procedure Laterality Date  . APPENDECTOMY    . CHOLECYSTECTOMY    . CORONARY ANGIOPLASTY     stent times 2, last cath 3 years ago  . FUDUCIAL PLACEMENT N/A 06/09/2018   Procedure: possible, PLACEMENT OF FUDUCIAL;  Surgeon: Grace Isaac, MD;  Location: [place];  Service: Thoracic;  Laterality: N/A;  . NERVE EXPLORATION  07/29/2012   Procedure: NERVE EXPLORATION;  Surgeon: Schuyler Amor, MD;  Location: [place];  Service: Orthopedics;  Laterality: Left;  Exploration /repair nerve and tendon left thumb  . PERIPHERAL VASCULAR CATHETERIZATION N/A 04/26/2015   Procedure: Abdominal Aortogram;  Surgeon: Rosetta Posner, MD;  Location: [facility];  Service: Cardiovascular;  Laterality: N/A;  . VIDEO BRONCHOSCOPY N/A 06/09/2018   Procedure: VIDEO BRONCHOSCOPY;  Surgeon: Grace Isaac, MD;  Location: [place];  Service: Thoracic;  Laterality: N/A;  . VIDEO BRONCHOSCOPY WITH ENDOBRONCHIAL NAVIGATION N/A 06/09/2018   Procedure: VIDEO BRONCHOSCOPY WITH ENDOBRONCHIAL NAVIGATION;  Surgeon: Grace Isaac, MD;  Location: [place];  Service: Thoracic;  Laterality: N/A;     reports that he quit smoking about 3 years ago. His smoking use included cigarettes. He has a 25.00 pack-year smoking history. He quit smokeless tobacco use about 4 years ago.  His smokeless tobacco use included chew. He reports that he does not drink alcohol or use drugs.  Allergies  Allergen Reactions  . Penicillins Other (See Comments)    UNSPECIFIED CHILDHOOD REACTION Has patient had PCN reaction causing immediate rash, facial/tongue/throat swelling, SOB or lightheadedness with hypotension: Unknown Has patient had a PCN reaction causing severe rash involving mucus membranes or skin necrosis: Unknown Has patient had a PCN reaction that required hospitalization: No Has patient had a PCN reaction occurring within the last 10 years: No If all of the above answers are "NO", then may proceed with Cephalosporin  use.     Family History  Problem Relation Age of Onset  . Coronary artery disease Mother   . Heart disease Mother   . Cancer Father   . Stroke Other   . Heart failure Other   . Pulmonary embolism Other   . Diabetes Brother   . Hypertension Brother   . Heart attack Brother   . Peripheral vascular disease Brother   . Heart disease Brother   . Hypertension Sister     Prior to Admission medications   Medication Sig Start Date End Date Taking? Authorizing Provider  acetaminophen (TYLENOL) 500 MG tablet Take 1,000 mg by mouth daily as needed for pain.    [provider]  allopurinol (ZYLOPRIM) 100 MG tablet Take 100 mg by mouth daily. Taking 100 mg tablets daily for one week starting 08-15-18 then take two 100 mg tablets daily    [provider]  amLODipine (NORVASC) 10 MG tablet Take 10 mg by mouth daily.      [provider]  aspirin EC 81 MG tablet Take 1 tablet (81 mg total) by mouth daily. 12/10/18   Burnell Blanks, MD  benazepril (LOTENSIN) 40 MG tablet Take 40 mg by mouth 2 (two) times daily.     [provider]  calcitRIOL (ROCALTROL) 0.25 MCG capsule Take by mouth daily. 05/28/18   [provider]  Cinnamon 500 MG capsule Take 500 mg by mouth daily.    [provider]  diclofenac sodium (VOLTAREN) 1 % GEL APPLY 2 GRAMS TOPICALLY TO THE AFFECTED AREA(S) ON FOOT FOUR TIMES DAILY 02/23/19   Trula Slade, DPM  ELIQUIS 5 MG TABS tablet Take 1 tablet by mouth 2 (two) times daily. 11/24/18   [provider]  furosemide (LASIX) 80 MG tablet Take 40 mg by mouth 2 (two) times daily.     [provider]  insulin lispro (HUMALOG) 100 UNIT/ML injection Inject 11 Units into the skin 2 (two) times daily before lunch and supper.     [provider]  losartan (COZAAR) 100 MG tablet Take 100 mg by mouth 2 (two) times daily.     [provider]  metoprolol (LOPRESSOR) 100 MG tablet Take 100 mg by  mouth 2 (two) times daily.      [provider]  mirtazapine (REMERON) 15 MG tablet Take 15 mg by mouth at bedtime. 07/30/16   [provider]  [place]  Scar Cream -  Verapamil 10%, Pentoxifylline 5% Apply 1-2 grams to affected area 3-4 times daily Qty. 120 gm 3 refills    [provider]  TOUJEO SOLOSTAR 300 UNIT/ML SOPN Inject 28 Units into the skin daily before breakfast.  08/09/16   [provider]  TURMERIC PO Take 400 mg by mouth 2 (two) times daily.    [provider]    Physical Exam: Vitals:   03/15/19 1046 03/15/19 1130 03/15/19 1145 03/15/19 1200  BP: 138/64 124/61 130/64 (!) 143/65  Pulse: 81  71 69 68  Resp: 19 18 16 16   Temp:      TempSrc:      SpO2: 90% 97% 98% 97%  Weight:      Height:        Constitutional: 68 y.o. male NAD, calm, comfortable Vitals:   03/15/19 1046 03/15/19 1130 03/15/19 1145 03/15/19 1200  BP: 138/64 124/61 130/64 (!) 143/65  Pulse: 81 71 69 68  Resp: 19 18 16 16   Temp:      TempSrc:      SpO2: 90% 97% 98% 97%  Weight:      Height:       Eyes: PERRL, lids and conjunctivae normal ENMT: Mucous membranes are moist. Posterior pharynx clear of any exudate or lesions.Normal dentition.  Neck: normal, supple, no masses, no thyromegaly Respiratory: slight expiratory wheeze. Normal respiratory effort. No accessory muscle use.  Cardiovascular: Regular rate and rhythm, no murmurs / rubs / gallops. No extremity edema. 2+ pedal pulses. No carotid bruits.  Abdomen: no tenderness, no masses palpated. No hepatosplenomegaly. Bowel sounds positive.  Musculoskeletal: no clubbing / cyanosis. No joint deformity upper and lower extremities. Good ROM, no contractures. Normal muscle tone.  Skin: no rashes, lesions, ulcers. No induration Neurologic: CN 2-12 grossly intact. Sensation intact, DTR normal. Strength 5/5 in all 4.  Psychiatric: Normal judgment and insight. Alert and oriented x 3.  Normal mood.    Labs on Admission: I have personally reviewed following labs and imaging studies  CBC: Recent Labs  Lab 03/15/19 1036  WBC 9.6  NEUTROABS 8.6*  HGB 14.1  HCT 45.6  MCV 86.5  PLT 224   Basic Metabolic Panel: Recent Labs  Lab 03/15/19 1036  NA 139  K 4.9  CL 106  CO2 20*  GLUCOSE 318*  BUN 53*  CREATININE 2.05*  CALCIUM 9.0   GFR: Estimated Creatinine Clearance: 40.7 mL/min (A) (by C-G formula based on SCr of 2.05 mg/dL (H)). Liver Function Tests: Recent Labs  Lab 03/15/19 1036  AST 25  ALT 24  ALKPHOS 57  BILITOT 0.5  PROT 7.1  ALBUMIN 2.7*   Recent Labs  Lab 03/15/19 1036  LIPASE 30   No results for input(s): AMMONIA in the last 168 hours. Coagulation Profile: Recent Labs  Lab 03/15/19 1036  INR 1.5*   Cardiac Enzymes: Recent Labs  Lab 03/15/19 1036  TROPONINI <0.03   BNP (last 3 results) No results for input(s): PROBNP in the last 8760 hours. HbA1C: No results for input(s): HGBA1C in the last 72 hours. CBG: No results for input(s): GLUCAP in the last 168 hours. Lipid Profile: No results for input(s): CHOL, HDL, LDLCALC, TRIG, CHOLHDL, LDLDIRECT in the last 72 hours. Thyroid Function Tests: No results for input(s): TSH, T4TOTAL, FREET4, T3FREE, THYROIDAB in the last 72 hours. Anemia Panel: No results for input(s): VITAMINB12, FOLATE, FERRITIN, TIBC, IRON, RETICCTPCT in the last 72 hours. Urine analysis: No results found for: COLORURINE, APPEARANCEUR, LABSPEC, [place], GLUCOSEU, HGBUR, BILIRUBINUR, KETONESUR, PROTEINUR, UROBILINOGEN, NITRITE, LEUKOCYTESUR  Radiological Exams on Admission: Ct Head Wo Contrast  Result Date: 03/15/2019 CLINICAL DATA:  68 year old male with acute dizziness and ataxia. EXAM: CT HEAD WITHOUT CONTRAST TECHNIQUE: Contiguous axial images were obtained from the base of the skull through the vertex without intravenous contrast. COMPARISON:  None. FINDINGS: Brain: An infarct within the SUPERIOR RIGHT  cerebellum appears acute or subacute. There is no evidence of hemorrhage. Mild atrophy and chronic small-vessel white matter ischemic changes are noted. No mass lesion, midline shift,  hydrocephalus or extra-axial collection noted. Vascular: Carotid and vertebral atherosclerotic calcifications noted. Skull: Normal. Negative for fracture or focal lesion. Sinuses/Orbits: No acute finding. Other: None. IMPRESSION: 1. Acute to subacute RIGHT cerebellar infarct. No evidence of hemorrhage. 2. Atrophy and chronic small-vessel white matter ischemic changes. Electronically Signed   By: Margarette Canada M.D.   On: 03/15/2019 12:49   Dg Chest Port 1 View  Result Date: 03/15/2019 CLINICAL DATA:  Cough.  History of lung cancer. EXAM: PORTABLE CHEST 1 VIEW COMPARISON:  10/23/2018 and CT 06/04/2018 FINDINGS: Lungs are hypoinflated with worsening masslike opacification over the right suprahilar/paramediastinal region with mild mixed interstitial airspace density over the more peripheral right upper lobe/apex. No evidence of effusion. Cardiomediastinal silhouette and remainder of the exam is unchanged. IMPRESSION: Mild interval worsening masslike opacity over the right suprahilar/paramediastinal region likely patient's known right lung cancer. Minimal mixed interstitial airspace density more peripherally over the right upper lobe/apex. Electronically Signed   By: Marin Olp M.D.   On: 03/15/2019 11:22    EKG: Independently reviewed.  Assessment/Plan Principal Problem:   CVA (cerebral vascular accident) ([place]) Active Problems:   HYPERCHOLESTEROLEMIA   Essential hypertension, benign   CORONARY ATHEROSCLEROSIS NATIVE CORONARY ARTERY   PVD   CKD (chronic kidney disease) stage 3, GFR 30-59 ml/min (HCC)   CVA     - admit to inpt med-tele     - neurology onboard     - CTH:  Acute to subacute RIGHT cerebellar infarct. No evidence of hemorrhage.     - MRI/MRA brain, Echo, Carotids ordered     - A1c, lipid panel ordered      - Consult PT/OT/SLP; NPO until swallow screen completed   HTN     - resume home meds  HLD     - high dose statin; lipitor 80mg   CAD     -  ASA, statin, BB     - can resume eliquis  PVD     - see above  CKD stage 3     - watch nephrotoxins; monitor     - looks like he is at his baseline Scr  N/V     - related to CVA     - antiemetics PRN, fluids  DM2     - takes toujeo at home and humalog     - start basal and SSI   DVT prophylaxis: SCDs  Code Status: FULL  Family Communication: Spoke with wife Otila Kluver [phone] @ 1430hrs   Disposition Plan: To be determined  Consults called: Neurology  Admission status: Inpt status d/t stroke and need for w/u that will place him in hospital greater than 2 MNs.     Jonnie Finner DO [facility] Pager [phone]  If 7PM-7AM, please contact night-coverage [email or web address] Password [email or web address]  03/15/2019, 2:04 PM

## 2019-03-15 NOTE — ED Provider Notes (Signed)
[facility] DEPART[facility] Provider Note   CSN: [id number] Arrival date & time: 03/15/19  6644   History   Chief Complaint Chief Complaint  Patient presents with   Emesis   Dizziness    HPI Travis [name] is a 68 y.o. male with a hx of CAD, COPD, squamous cell cancer of the lungs bilaterally, HTN, CKD stage II, hyperlipidemia, PVD, and prior appendectomy & cholecystectomy who presents to the ED for N/V & dizziness that began @ 0100 this AM. Patient informs me that he went to bed feeling fairly normal last night around 22:00 and woke up at 0100 w/ heavy coughing and subsequent nauea & vomiting. Notes approximately 10 episodes of emesis which were dark, no obvious blood or coffee ground appearance per the patient. He states with the vomiting he had began to feel generally weak and has started to have dizziness/lightheadedness with activity, especially attempts to stand. States he feels off balance, the room is spinning, and he feels he may pass out at that time. NO syncope has occurred. He also states that his speech seems abnormal to him- he states he feels he cannot get the words out he is trying to say and that it takes longer to get the words out. He informs me that this started at 0100, he tells the RN this started last night prior to bed. Has been coughing for several weeks, no acute change. Denies fever, chills, melena, hematochezia, abdominal pain, diarrhea, constipation, chest pain, or dyspnea.     HPI  Past Medical History:  Diagnosis Date   CAD (coronary artery disease)    Cancer ([place])    skin cancer face   COPD (chronic obstructive pulmonary disease) ([place])    Coronary artery disease    PCA 1994l; PCI august 2003 of an obtust marginal. Catheterization at the time reveal: left main normal; LAD 50-75% between first and second diagonals, 50% after lesion; left circumflex 95% in obtuse marg. RCA 100% proximal.   Diabetes mellitus    type 2    Hyperlipemia    Hypertension    Kidney disease    chronic; stage 2 followed By Dr Deterding   Myocardial infarction [facility])    Osteoarthritis    Peripheral vascular disease ([place])    Tobacco abuse     Patient Active Problem List   Diagnosis Date Noted   Squamous cell carcinoma of lungs, bilateral ([place]) 06/11/2018   Flexor tendon laceration, finger, open wound 07/29/2012   PVD 12/08/2009   DIAB W/RENAL MANIFESTS TYPE I [JUV] NOT UNCNTRL 09/08/2009   HYPERCHOLESTEROLEMIA 09/08/2009   TOBACCO ABUSE 09/08/2009   ESSENTIAL HYPERTENSION, BENIGN 09/08/2009   CORONARY ATHEROSCLEROSIS NATIVE CORONARY ARTERY 09/08/2009   CHRONIC KIDNEY DISEASE STAGE II (M[facility]) 09/08/2009    Past Surgical History:  Procedure Laterality Date   APPENDECTOMY     CHOLECYSTECTOMY     CORONARY ANGIOPLASTY     stent times 2, last cath 3 years ago   [place] N/A 06/09/2018   Procedure: possible, PLACEMENT OF FUDUCIAL;  Surgeon: Grace Isaac, MD;  Location: [place];  Service: Thoracic;  Laterality: N/A;   NERVE EXPLORATION  07/29/2012   Procedure: NERVE EXPLORATION;  Surgeon: Schuyler Amor, MD;  Location: [place];  Service: Orthopedics;  Laterality: Left;  Exploration /repair nerve and tendon left thumb   PERIPHERAL VASCULAR CATHETERIZATION N/A 04/26/2015   Procedure: Abdominal Aortogram;  Surgeon: Rosetta Posner, MD;  Location: [place] CV LAB;  Service: Cardiovascular;  Laterality: N/A;   VIDEO BRONCHOSCOPY N/A 06/09/2018   Procedure: VIDEO BRONCHOSCOPY;  Surgeon: Grace Isaac, MD;  Location: [place];  Service: Thoracic;  Laterality: N/A;   VIDEO BRONCHOSCOPY WITH ENDOBRONCHIAL NAVIGATION N/A 06/09/2018   Procedure: VIDEO BRONCHOSCOPY WITH ENDOBRONCHIAL NAVIGATION;  Surgeon: Grace Isaac, MD;  Location: [facility];  Service: Thoracic;  Laterality: N/A;        Home Medications    Prior to Admission medications   Medication Sig Start Date End Date  Taking? Authorizing Provider  acetaminophen (TYLENOL) 500 MG tablet Take 1,000 mg by mouth daily as needed for pain.    [provider]  allopurinol (ZYLOPRIM) 100 MG tablet Take 100 mg by mouth daily. Taking 100 mg tablets daily for one week starting 08-15-18 then take two 100 mg tablets daily    [provider]  amLODipine (NORVASC) 10 MG tablet Take 10 mg by mouth daily.      [provider]  aspirin EC 81 MG tablet Take 1 tablet (81 mg total) by mouth daily. 12/10/18   Burnell Blanks, MD  benazepril (LOTENSIN) 40 MG tablet Take 40 mg by mouth 2 (two) times daily.     [provider]  calcitRIOL (ROCALTROL) 0.25 MCG capsule Take by mouth daily. 05/28/18   [provider]  Cinnamon 500 MG capsule Take 500 mg by mouth daily.    [provider]  diclofenac sodium (VOLTAREN) 1 % GEL APPLY 2 GRAMS TOPICALLY TO THE AFFECTED AREA(S) ON FOOT FOUR TIMES DAILY 02/23/19   Trula Slade, DPM  ELIQUIS 5 MG TABS tablet Take 1 tablet by mouth 2 (two) times daily. 11/24/18   [provider]  furosemide (LASIX) 80 MG tablet Take 40 mg by mouth 2 (two) times daily.     [provider]  insulin lispro (HUMALOG) 100 UNIT/ML injection Inject 11 Units into the skin 2 (two) times daily before lunch and supper.     [provider]  losartan (COZAAR) 100 MG tablet Take 100 mg by mouth 2 (two) times daily.     [provider]  metoprolol (LOPRESSOR) 100 MG tablet Take 100 mg by mouth 2 (two) times daily.      [provider]  mirtazapine (REMERON) 15 MG tablet Take 15 mg by mouth at bedtime. 07/30/16   [provider]  [place]  Scar Cream -  Verapamil 10%, Pentoxifylline 5% Apply 1-2 grams to affected area 3-4 times daily Qty. 120 gm 3 refills    [provider]  TOUJEO SOLOSTAR 300 UNIT/ML SOPN Inject 28 Units into the skin daily before breakfast.  08/09/16   [provider]  TURMERIC PO Take 400 mg by mouth 2 (two) times daily.    [provider]    Family History Family History  Problem Relation Age of Onset   Coronary artery disease Mother    Heart disease Mother    Cancer Father    Stroke Other    Heart failure Other    Pulmonary embolism Other    Diabetes Brother    Hypertension Brother    Heart attack Brother    Peripheral vascular disease Brother    Heart disease Brother    Hypertension Sister     Social History Social History   Tobacco Use   Smoking status: Former Smoker    Packs/day: 0.50    Years: 50.00    Pack years: 25.00  Types: Cigarettes    Last attempt to quit: 05/09/2015    Years since quitting: 3.8   Smokeless tobacco: Former Systems developer    Types: Chew    Quit date: 2016  Substance Use Topics   Alcohol use: No    Alcohol/week: 0.0 standard drinks   Drug use: No     Allergies   Penicillins   Review of Systems Review of Systems  Constitutional: Negative for chills and fever.  Eyes: Negative for visual disturbance.  Respiratory: Positive for cough. Negative for shortness of breath.   Cardiovascular: Negative for chest pain.  Gastrointestinal: Positive for nausea and vomiting. Negative for abdominal pain, blood in stool, constipation and diarrhea.  Genitourinary: Negative for dysuria.  Neurological: Positive for dizziness, speech difficulty, weakness (general) and light-headedness. Negative for facial asymmetry, numbness and headaches.  All other systems reviewed and are negative.  Physical Exam Updated Vital Signs BP 138/64    Pulse 81    Temp 97.9 F (36.6 C) (Oral)    Resp 19    Ht 6' (1.829 m)    Wt 95.3 kg    SpO2 90%    BMI 28.48 kg/m   Physical Exam Vitals signs and nursing note reviewed.  Constitutional:      General: He is not in acute distress.    Appearance: He is well-developed. He is not toxic-appearing.  HENT:     Head: Normocephalic and atraumatic.  Eyes:       General:        Right eye: No discharge.        Left eye: No discharge.     Extraocular Movements: Extraocular movements intact.     Conjunctiva/sclera: Conjunctivae normal.     Pupils: Pupils are equal, round, and reactive to light.     Comments: NO vertical or rotational nystagmus noted.   Neck:     Musculoskeletal: Neck supple.  Cardiovascular:     Rate and Rhythm: Normal rate and regular rhythm.  Pulmonary:     Effort: Pulmonary effort is normal. No respiratory distress.     Breath sounds: Wheezing (mild diffuse) present. No rhonchi or rales.  Abdominal:     General: There is no distension.     Palpations: Abdomen is soft.     Tenderness: There is no abdominal tenderness. There is no guarding or rebound.  Skin:    General: Skin is warm and dry.     Findings: No rash.  Neurological:     Mental Status: He is alert.     Comments: Alert. Slow to respond, some stuttering. No facial droop. CNIII-XII grossly intact. Bilateral upper and lower extremities' sensation grossly intact. 5/5 symmetric strength with grip strength and with plantar and dorsi flexion bilaterally. Some difficulty w/ coordination with finger to nose R>L.  Negative pronator drift. Unsteady Romberg and with standing.   Psychiatric:        Behavior: Behavior normal.    ED Treatments / Results  Labs (all labs ordered are listed, but only abnormal results are displayed) Labs Reviewed  CBC WITH DIFFERENTIAL/PLATELET - Abnormal; Notable for the following components:      Result Value   Neutro Abs 8.6 (*)    Lymphs Abs 0.6 (*)    All other components within normal limits  COMPREHENSIVE METABOLIC PANEL - Abnormal; Notable for the following components:   CO2 20 (*)    Glucose, Bld 318 (*)    BUN 53 (*)    Creatinine, Ser 2.05 (*)  Albumin 2.7 (*)    GFR calc non Af Amer 32 (*)    GFR calc Af Amer 37 (*)    All other components within normal limits  PROTIME-INR - Abnormal; Notable for the following  components:   Prothrombin Time 17.8 (*)    INR 1.5 (*)    All other components within normal limits  SARS CORONAVIRUS 2 (HOSPITAL ORDER, PERFORMED IN [facility])  LIPASE, BLOOD  TROPONIN I  POC OCCULT BLOOD, ED    EKG EKG Interpretation  Date/Time:  Monday Mar 15 2019 09:54:15 EDT Ventricular Rate:  89 PR Interval:    QRS Duration: 129 QT Interval:  401 QTC Calculation: 488 R Axis:   58 Text Interpretation:  Sinus rhythm Ventricular premature complex Nonspecific intraventricular conduction delay When compared to prior, new PVC.  No STEMI Confirmed by Antony Blackbird [phone]) on 03/15/2019 10:18:56 AM  Radiology Ct Head Wo Contrast  Result Date: 03/15/2019 CLINICAL DATA:  68 year old male with acute dizziness and ataxia. EXAM: CT HEAD WITHOUT CONTRAST TECHNIQUE: Contiguous axial images were obtained from the base of the skull through the vertex without intravenous contrast. COMPARISON:  None. FINDINGS: Brain: An infarct within the SUPERIOR RIGHT cerebellum appears acute or subacute. There is no evidence of hemorrhage. Mild atrophy and chronic small-vessel white matter ischemic changes are noted. No mass lesion, midline shift, hydrocephalus or extra-axial collection noted. Vascular: Carotid and vertebral atherosclerotic calcifications noted. Skull: Normal. Negative for fracture or focal lesion. Sinuses/Orbits: No acute finding. Other: None. IMPRESSION: 1. Acute to subacute RIGHT cerebellar infarct. No evidence of hemorrhage. 2. Atrophy and chronic small-vessel white matter ischemic changes. Electronically Signed   By: Margarette Canada M.D.   On: 03/15/2019 12:49   Dg Chest Port 1 View  Result Date: 03/15/2019 CLINICAL DATA:  Cough.  History of lung cancer. EXAM: PORTABLE CHEST 1 VIEW COMPARISON:  10/23/2018 and CT 06/04/2018 FINDINGS: Lungs are hypoinflated with worsening masslike opacification over the right suprahilar/paramediastinal region with mild mixed interstitial airspace  density over the more peripheral right upper lobe/apex. No evidence of effusion. Cardiomediastinal silhouette and remainder of the exam is unchanged. IMPRESSION: Mild interval worsening masslike opacity over the right suprahilar/paramediastinal region likely patient's known right lung cancer. Minimal mixed interstitial airspace density more peripherally over the right upper lobe/apex. Electronically Signed   By: Marin Olp M.D.   On: 03/15/2019 11:22    Procedures Procedures (including critical care time)  Medications Ordered in ED Medications  albuterol (VENTOLIN HFA) 108 (90 Base) MCG/ACT inhaler 2 puff (has no administration in time range)  ondansetron (ZOFRAN) injection 4 mg (4 mg Intravenous Given 03/15/19 1043)  sodium chloride 0.9 % bolus 500 mL (0 mLs Intravenous Stopped 03/15/19 1212)   Initial Impression / Assessment and Plan / ED Course  I have reviewed the triage vital signs and the nursing notes.  Pertinent labs & imaging results that were available during my care of the patient were reviewed by me and considered in my medical decision making (see chart for details).   Patient presents to the ED w/ speech difficulty, dizziness/lightheadedness, and N/V. Nontoxic appearing, initial vitals WNL with the exception of mildly elevated BP. On exam no focal strength/sensation deficits, some difficulty w/ speech, trouble w/ coordination, very unsteady w/ being upright & w/ romberg- discussed w/ Dr. Sherry Ruffing not a code stroke as LVO negative and > 4.5 hours, however there is concern for stroke w/ his presentation. Some wheezing noted, not in respiratory distress- hx of  COPD- give albuterol.  Abdomen nontender without peritoneal signs. Plan for labs, CXR, & CT head wo. Will also obtain fecal occult w/ hx of dark emesis.   Work-up reviewed:  CBC: No leukocytosis/anemia.  CMP: CKD- no acute change, somewhat improved from prior. Hyperglycemia.  Lipase: WNL Fecal occult: Negative.   Nontender  abdomen, no peritoneal signs- doubt acute surgical abdomen as underlying cause of emesis. Fecal occult negative- doubt GI bleed.  CXR: Mild interval worsening masslike opacity over the right suprahilar/paramediastinal region likely patient's known right lung cancer. Minimal mixed interstitial airspace density more peripherally over the right upper lobe/apex.  At one point during stay patient desaturated to upper 80s per RN, this was prior to my ordering of albuterol, taken off oxygen by me- maintaining saturations, to receive albuterol, w/ wheezing & COPD hx suspect mild COPD exacerbation. COVID negative.   CT head: IMPRESSION: 1. Acute to subacute RIGHT cerebellar infarct. No evidence of hemorrhage. 2. Atrophy and chronic small-vessel white matter ischemic changes  13:09: CONSULT: Discussed w/ neurologist Dr. Cheral Marker- recommends MRI/MRA which he will place orders for.   13:24: CONSULT: Discussed w/ hospitalist Dr. Marylyn Ishihara- accepts admission.   Patient & his son (via telephone) updated on results & plan of care, provided opportunity for questions, confirmed understanding & are in agreement.   This is a shared visit with supervising physician Dr. Sherry Ruffing who has independently evaluated patient & provided guidance in evaluation/management/disposition, in agreement with care   Final Clinical Impressions(s) / ED Diagnoses   Final diagnoses:  Cerebellar infarct [facility])    ED Discharge Orders    None       Amaryllis Dyke, PA-C 03/15/19 1414    Tegeler, Gwenyth Allegra, MD 03/15/19 1649

## 2019-03-15 NOTE — ED Notes (Signed)
Patient transported to MRI 

## 2019-03-15 NOTE — Consult Note (Signed)
Referring Physician: Dr. Sherry Ruffing    Chief Complaint: Incoordination and dizziness  HPI: Travis [name] is an 68 y.o. male who presents to the ED today with symptoms of dizziness with N/V since early this AM at 0100. He awoke from sleep with a cough and then started to vomit, with the vomiting persisting throughout the night. He was not completely normal prior to this, however, having noticed that his speech had changed yesterday night at about 2200, with "trouble saying words". Of note, O2 saturations in the ED were 89-92%, so he was started on 2L [place].   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   Home medications include ASA and Eliquis. No items listed in his PMHx to explain the indication in this patient for Eliquis.   CT head revealed a subacute right cerebellar hemisphere ischemic infarction.   LSN: [id number] on 5/3 tPA Given: No: Out of time window  Past Medical History:  Diagnosis Date  . CAD (coronary artery disease)   . Cancer ([place])    skin cancer face  . COPD (chronic obstructive pulmonary disease) ([place])   . Coronary artery disease    PCA 1994l; PCI august 2003 of an obtust marginal. Catheterization at the time reveal: left main normal; LAD 50-75% between first and second diagonals, 50% after lesion; left circumflex 95% in obtuse marg. RCA 100% proximal.  . Diabetes mellitus    type 2  . Hyperlipemia   . Hypertension   . Kidney disease    chronic; stage 2 followed By Dr Deterding  . Myocardial infarction ([place])   . Osteoarthritis   . Peripheral vascular disease ([place])   . Tobacco abuse     Past Surgical History:  Procedure Laterality Date  . APPENDECTOMY    . CHOLECYSTECTOMY    . CORONARY ANGIOPLASTY     stent times 2, last cath 3 years ago  . FUDUCIAL PLACEMENT N/A 06/09/2018   Procedure: possible, PLACEMENT OF FUDUCIAL;  Surgeon: Grace Isaac, MD;  Location: [facility];  Service:  Thoracic;  Laterality: N/A;  . NERVE EXPLORATION  07/29/2012   Procedure: NERVE EXPLORATION;  Surgeon: Schuyler Amor, MD;  Location: [facility];  Service: Orthopedics;  Laterality: Left;  Exploration /repair nerve and tendon left thumb  . PERIPHERAL VASCULAR CATHETERIZATION N/A 04/26/2015   Procedure: Abdominal Aortogram;  Surgeon: Rosetta Posner, MD;  Location: [facility];  Service: Cardiovascular;  Laterality: N/A;  . VIDEO BRONCHOSCOPY N/A 06/09/2018   Procedure: VIDEO BRONCHOSCOPY;  Surgeon: Grace Isaac, MD;  Location: [facility];  Service: Thoracic;  Laterality: N/A;  . VIDEO BRONCHOSCOPY WITH ENDOBRONCHIAL NAVIGATION N/A 06/09/2018   Procedure: VIDEO BRONCHOSCOPY WITH ENDOBRONCHIAL NAVIGATION;  Surgeon: Grace Isaac, MD;  Location: [facility];  Service: Thoracic;  Laterality: N/A;    Family History  Problem Relation Age of Onset  . Coronary artery disease Mother   . Heart disease Mother   . Cancer Father   . Stroke Other   . Heart failure Other   . Pulmonary embolism Other   . Diabetes Brother   . Hypertension Brother   . Heart attack Brother   . Peripheral vascular disease Brother   . Heart disease Brother   . Hypertension Sister    Social History:  reports that he quit smoking about 3 years ago. His smoking use included cigarettes. He has a 25.00 pack-year  smoking history. He quit smokeless tobacco use about 4 years ago.  His smokeless tobacco use included chew. He reports that he does not drink alcohol or use drugs.  Allergies:  Allergies  Allergen Reactions  . Penicillins Other (See Comments)    UNSPECIFIED CHILDHOOD REACTION Has patient had PCN reaction causing immediate rash, facial/tongue/throat swelling, SOB or lightheadedness with hypotension: Unknown Has patient had a PCN reaction causing severe rash involving mucus membranes or skin necrosis: Unknown Has patient had a PCN reaction that required hospitalization: No Has patient had a PCN  reaction occurring within the last 10 years: No If all of the above answers are "NO", then may proceed with Cephalosporin use.     Home Medications:    ROS: Positive for dizziness/lightheadedness with activity and presyncope, but no syncope. Positive for vertigo. Positive for cough and generalized weakness. No chills, fever, vision changes, SOB, CP, abdominal pain, dysuria or headaches. Other ROS as per HPI with all other systems negative.   Physical Examination: Blood pressure (!) 143/65, pulse 68, temperature 97.9 F (36.6 C), temperature source Oral, resp. rate 16, height 6' (1.829 m), weight 95.3 kg, SpO2 97 %.  HEENT: [place]/AT Lungs: Respirations unlabored Ext: No edema  Neurologic Examination: Mental Status: Alert, fully oriented, thought content appropriate.  Speech fluent without evidence of aphasia.  Able to follow all commands without difficulty. Ataxic/dysarthric speech pattern is noted.  Cranial Nerves: II:  Visual fields intact with no extinction to DSS. PERRL III,IV, VI: No ptosis. EOMI with saccadic pursuits noted.  V,VII: Temp sensation equal bilaterally. No facial droop.  VIII: hearing intact to voice IX,X: Palate rises symmetrically XI: Symmetric XII: Midline tongue extension  Motor: 5/5 x 4  Sensory: Temp and light touch intact throughout, bilaterally. No extinction.  Deep Tendon Reflexes:  Normoactive x 4 Cerebellar: Ataxia noted with FNF and H-S on the right. Normal on the left.  Gait: Deferred   Results for orders placed or performed during the hospital encounter of 03/15/19 (from the past 48 hour(s))  CBC with Differential     Status: Abnormal   Collection Time: 03/15/19 10:36 AM  Result Value Ref Range   WBC 9.6 4.0 - 10.5 K/uL   RBC 5.27 4.22 - 5.81 MIL/uL   Hemoglobin 14.1 13.0 - 17.0 g/dL   HCT 45.6 39.0 - 52.0 %   MCV 86.5 80.0 - 100.0 fL   MCH 26.8 26.0 - 34.0 pg   MCHC 30.9 30.0 - 36.0 g/dL   RDW 14.2 11.5 - 15.5 %   Platelets 277 150 - 400  K/uL   nRBC 0.0 0.0 - 0.2 %   Neutrophils Relative % 88 %   Neutro Abs 8.6 (H) 1.7 - 7.7 K/uL   Lymphocytes Relative 7 %   Lymphs Abs 0.6 (L) 0.7 - 4.0 K/uL   Monocytes Relative 3 %   Monocytes Absolute 0.3 0.1 - 1.0 K/uL   Eosinophils Relative 0 %   Eosinophils Absolute 0.0 0.0 - 0.5 K/uL   Basophils Relative 1 %   Basophils Absolute 0.1 0.0 - 0.1 K/uL   Immature Granulocytes 1 %   Abs Immature Granulocytes 0.05 0.00 - 0.07 K/uL    Comment: Performed at [facility], [address] [place], [place] [address]  Comprehensive metabolic panel     Status: Abnormal   Collection Time: 03/15/19 10:36 AM  Result Value Ref Range   Sodium 139 135 - 145 mmol/L   Potassium 4.9 3.5 - 5.1 mmol/L  Chloride 106 98 - 111 mmol/L   CO2 20 (L) 22 - 32 mmol/L   Glucose, Bld 318 (H) 70 - 99 mg/dL   BUN 53 (H) 8 - 23 mg/dL   Creatinine, Ser 2.05 (H) 0.61 - 1.24 mg/dL   Calcium 9.0 8.9 - 10.3 mg/dL   Total Protein 7.1 6.5 - 8.1 g/dL   Albumin 2.7 (L) 3.5 - 5.0 g/dL   AST 25 15 - 41 U/L   ALT 24 0 - 44 U/L   Alkaline Phosphatase 57 38 - 126 U/L   Total Bilirubin 0.5 0.3 - 1.2 mg/dL   GFR calc non Af Amer 32 (L) >60 mL/min   GFR calc Af Amer 37 (L) >60 mL/min   Anion gap 13 5 - 15    Comment: Performed at [facility] [address] [place], [place] [address]  Lipase, blood     Status: None   Collection Time: 03/15/19 10:36 AM  Result Value Ref Range   Lipase 30 11 - 51 U/L    Comment: Performed at [facility], [place] [address], [place], [place] [address]  Protime-INR     Status: Abnormal   Collection Time: 03/15/19 10:36 AM  Result Value Ref Range   Prothrombin Time 17.8 (H) 11.4 - 15.2 seconds   INR 1.5 (H) 0.8 - 1.2    Comment: (NOTE) INR goal varies based on device and disease states. Performed at [facility], [place] [address] [place], [place] [address]   Troponin I - ONCE - STAT     Status: None   Collection Time: 03/15/19 10:36 AM  Result Value Ref Range    Troponin I <0.03 <0.03 ng/mL    Comment: Performed at [facility] [address] [place], [place] [address]  SARS Coronavirus 2 (CEPHEID- Performed in [facility] lab), Hosp Order     Status: None   Collection Time: 03/15/19 10:46 AM  Result Value Ref Range   SARS Coronavirus 2 NEGATIVE NEGATIVE    Comment: (NOTE) If result is NEGATIVE SARS-CoV-2 target nucleic acids are NOT DETECTED. The SARS-CoV-2 RNA is generally detectable in upper and lower  respiratory specimens during the acute phase of infection. The lowest  concentration of SARS-CoV-2 viral copies this assay can detect is 250  copies / mL. A negative result does not preclude SARS-CoV-2 infection  and should not be used as the sole basis for treatment or other  patient management decisions.  A negative result may occur with  improper specimen collection / handling, submission of specimen other  than nasopharyngeal swab, presence of viral mutation(s) within the  areas targeted by this assay, and inadequate number of viral copies  (<250 copies / mL). A negative result must be combined with clinical  observations, patient history, and epidemiological information. If result is POSITIVE SARS-CoV-2 target nucleic acids are DETECTED. The SARS-CoV-2 RNA is generally detectable in upper and lower  respiratory specimens dur ing the acute phase of infection.  Positive  results are indicative of active infection with SARS-CoV-2.  Clinical  correlation with patient history and other diagnostic information is  necessary to determine patient infection status.  Positive results do  not rule out bacterial infection or co-infection with other viruses. If result is PRESUMPTIVE POSTIVE SARS-CoV-2 nucleic acids MAY BE PRESENT.   A presumptive positive result was obtained on the submitted specimen  and confirmed on repeat testing.  While 2019 novel coronavirus  (SARS-CoV-2) nucleic  acids may be present in the submitted sample   additional confirmatory testing may be necessary for epidemiological  and / or clinical management purposes  to differentiate between  SARS-CoV-2 and other Sarbecovirus currently known to infect humans.  If clinically indicated additional testing with an alternate test  methodology [phone]) is advised. The SARS-CoV-2 RNA is generally  detectable in upper and lower respiratory sp ecimens during the acute  phase of infection. The expected result is Negative. Fact Sheet for Patients:  StrictlyIdeas.no Fact Sheet for Healthcare Providers: BankingDealers.co.za This test is not yet approved or cleared by the [place] FDA and has been authorized for detection and/or diagnosis of SARS-CoV-2 by FDA under an Emergency Use Authorization (EUA).  This EUA will remain in effect (meaning this test can be used) for the duration of the COVID-19 declaration under Section 564(b)(1) of the Act, 21 U.S.C. section 360bbb-3(b)(1), unless the authorization is terminated or revoked sooner. Performed at [facility], [place] [address], [place], [place] [address]   POC occult blood, ED     Status: None   Collection Time: 03/15/19 10:55 AM  Result Value Ref Range   Fecal Occult Bld NEGATIVE NEGATIVE   Ct Head Wo Contrast  Result Date: 03/15/2019 CLINICAL DATA:  68 year old male with acute dizziness and ataxia. EXAM: CT HEAD WITHOUT CONTRAST TECHNIQUE: Contiguous axial images were obtained from the base of the skull through the vertex without intravenous contrast. COMPARISON:  None. FINDINGS: Brain: An infarct within the SUPERIOR RIGHT cerebellum appears acute or subacute. There is no evidence of hemorrhage. Mild atrophy and chronic small-vessel white matter ischemic changes are noted. No mass lesion, midline shift, hydrocephalus or extra-axial collection noted. Vascular: Carotid and vertebral atherosclerotic calcifications noted. Skull: Normal. Negative for  fracture or focal lesion. Sinuses/Orbits: No acute finding. Other: None. IMPRESSION: 1. Acute to subacute RIGHT cerebellar infarct. No evidence of hemorrhage. 2. Atrophy and chronic small-vessel white matter ischemic changes. Electronically Signed   By: Margarette Canada M.D.   On: 03/15/2019 12:49   Dg Chest Port 1 View  Result Date: 03/15/2019 CLINICAL DATA:  Cough.  History of lung cancer. EXAM: PORTABLE CHEST 1 VIEW COMPARISON:  10/23/2018 and CT 06/04/2018 FINDINGS: Lungs are hypoinflated with worsening masslike opacification over the right suprahilar/paramediastinal region with mild mixed interstitial airspace density over the more peripheral right upper lobe/apex. No evidence of effusion. Cardiomediastinal silhouette and remainder of the exam is unchanged. IMPRESSION: Mild interval worsening masslike opacity over the right suprahilar/paramediastinal region likely patient's known right lung cancer. Minimal mixed interstitial airspace density more peripherally over the right upper lobe/apex. Electronically Signed   By: Marin Olp M.D.   On: 03/15/2019 11:22    Assessment: 68 y.o. male with subacute right cerebellar ischemic infarction 1. Exam reveals right hemiataxia.  2. CT head reveals a subacute RIGHT cerebellar infarct with no evidence of hemorrhage. Atrophy and chronic small-vessel white matter ischemic changes are also noted. 3. Stroke Risk Factors - CAD s/p PCA, MI, DM2, HLD, HTN, PVD and tobacco abuse.  4. CKD stage 2 5. Home medications include ASA and Eliquis. The patient states that he had a PE about 2 months ago, which is the reason for the anticoagulation.  Plan: 1. HgbA1c, fasting lipid panel 2. MRI, MRA  of the brain without contrast 3. PT consult, OT consult, Speech consult 4. Echocardiogram 5. Carotid dopplers 6. Prophylactic therapy - Continue ASA. Cannot escalate to DAPT due to patient being on anticoagulation, which presents a significant  increased bleeding risk.   7. Risk  factor modification 8. Telemetry monitoring 9. Frequent neuro checks 10. Permissive HTN risks most likely outweigh potential benefits given increased risk for hemorrhagic conversion of the completed right cerebellar stroke in the setting of concomitant ASA and anticoagulant therapy.    @Electronically  signed: Dr. Kerney Elbe 03/15/2019, 1:14 PM

## 2019-03-15 NOTE — ED Notes (Addendum)
ED TO INPATIENT HANDOFF REPORT  ED Nurse Name and Phone #: [phone] ginnie   S Name/Age/Gender Travis [name] 68 y.o. male Room/Bed: 036C/036C  Code Status   Code Status: Prior  Home/SNF/Other Home Patient oriented to: self, place, time and situation Is this baseline? Yes   Triage Complete: Triage complete  Chief Complaint Dizziness; N/V  Triage Note Pt arrives to ED from home with complaints of nausea, vomiting, and dizziness starting last night at 0100 (5/4) where patient was woken up from sleep by cough then starting vomiting the rest of the night. Pt reports his vomit was black and he had over 10 episodes but denies any pain. Also pt has stated that he is having trouble saying words, says his speech changed sometime last night (5/3) around 2200.    Allergies Allergies  Allergen Reactions  . Penicillins Other (See Comments)    UNSPECIFIED CHILDHOOD REACTION Has patient had PCN reaction causing immediate rash, facial/tongue/throat swelling, SOB or lightheadedness with hypotension: Unknown Has patient had a PCN reaction causing severe rash involving mucus membranes or skin necrosis: Unknown Has patient had a PCN reaction that required hospitalization: No Has patient had a PCN reaction occurring within the last 10 years: No If all of the above answers are "NO", then may proceed with Cephalosporin use.     Level of Care/Admitting Diagnosis ED Disposition    ED Disposition Condition Comment   Admit  Hospital Area: [facility] [[id number]]  Level of Care: Telemetry Medical [104]  Covid Evaluation: N/A  Diagnosis: CVA (cerebral vascular accident) [facility]) [[id number]]  Admitting Physician: Jonnie Finner [[id number]]  Attending Physician: Jonnie Finner [[id number]]  Estimated length of stay: 3 - 4 days  Certification:: I certify this patient will need inpatient services for at least 2 midnights  PT Class (Do Not Modify): Inpatient [101]  PT Acc Code (Do Not Modify): Private  [1]       B Medical/Surgery History Past Medical History:  Diagnosis Date  . CAD (coronary artery disease)   . Cancer ([place])    skin cancer face  . COPD (chronic obstructive pulmonary disease) ([place])   . Coronary artery disease    PCA 1994l; PCI august 2003 of an obtust marginal. Catheterization at the time reveal: left main normal; LAD 50-75% between first and second diagonals, 50% after lesion; left circumflex 95% in obtuse marg. RCA 100% proximal.  . Diabetes mellitus    type 2  . Hyperlipemia   . Hypertension   . Kidney disease    chronic; stage 2 followed By Dr Deterding  . Myocardial infarction ([place])   . Osteoarthritis   . Peripheral vascular disease ([place])   . Tobacco abuse    Past Surgical History:  Procedure Laterality Date  . APPENDECTOMY    . CHOLECYSTECTOMY    . CORONARY ANGIOPLASTY     stent times 2, last cath 3 years ago  . FUDUCIAL PLACEMENT N/A 06/09/2018   Procedure: possible, PLACEMENT OF FUDUCIAL;  Surgeon: Grace Isaac, MD;  Location: [facility];  Service: Thoracic;  Laterality: N/A;  . NERVE EXPLORATION  07/29/2012   Procedure: NERVE EXPLORATION;  Surgeon: Schuyler Amor, MD;  Location: [facility];  Service: Orthopedics;  Laterality: Left;  Exploration /repair nerve and tendon left thumb  . PERIPHERAL VASCULAR CATHETERIZATION N/A 04/26/2015   Procedure: Abdominal Aortogram;  Surgeon: Rosetta Posner, MD;  Location: [facility] [place] [facility];  Service: Cardiovascular;  Laterality: N/A;  . VIDEO  BRONCHOSCOPY N/A 06/09/2018   Procedure: VIDEO BRONCHOSCOPY;  Surgeon: Grace Isaac, MD;  Location: [facility];  Service: Thoracic;  Laterality: N/A;  . VIDEO BRONCHOSCOPY WITH ENDOBRONCHIAL NAVIGATION N/A 06/09/2018   Procedure: VIDEO BRONCHOSCOPY WITH ENDOBRONCHIAL NAVIGATION;  Surgeon: Grace Isaac, MD;  Location: [facility];  Service: Thoracic;  Laterality: N/A;     A IV Location/Drains/Wounds Patient Lines/Drains/Airways Status   Active  Line/Drains/Airways    Name:   Placement date:   Placement time:   Site:   Days:   Peripheral IV 03/15/19 Right;Upper Forearm   03/15/19    1008    Forearm   less than 1          Intake/Output Last 24 hours  Intake/Output Summary (Last 24 hours) at 03/15/2019 1755 Last data filed at 03/15/2019 1212 Gross per 24 hour  Intake 500 ml  Output -  Net 500 ml    Labs/Imaging Results for orders placed or performed during the hospital encounter of 03/15/19 (from the past 48 hour(s))  CBC with Differential     Status: Abnormal   Collection Time: 03/15/19 10:36 AM  Result Value Ref Range   WBC 9.6 4.0 - 10.5 K/uL   RBC 5.27 4.22 - 5.81 MIL/uL   Hemoglobin 14.1 13.0 - 17.0 g/dL   HCT 45.6 39.0 - 52.0 %   MCV 86.5 80.0 - 100.0 fL   MCH 26.8 26.0 - 34.0 pg   MCHC 30.9 30.0 - 36.0 g/dL   RDW 14.2 11.5 - 15.5 %   Platelets 277 150 - 400 K/uL   nRBC 0.0 0.0 - 0.2 %   Neutrophils Relative % 88 %   Neutro Abs 8.6 (H) 1.7 - 7.7 K/uL   Lymphocytes Relative 7 %   Lymphs Abs 0.6 (L) 0.7 - 4.0 K/uL   Monocytes Relative 3 %   Monocytes Absolute 0.3 0.1 - 1.0 K/uL   Eosinophils Relative 0 %   Eosinophils Absolute 0.0 0.0 - 0.5 K/uL   Basophils Relative 1 %   Basophils Absolute 0.1 0.0 - 0.1 K/uL   Immature Granulocytes 1 %   Abs Immature Granulocytes 0.05 0.00 - 0.07 K/uL    Comment: Performed at [facility], [address] [place], [place] [address]  Comprehensive metabolic panel     Status: Abnormal   Collection Time: 03/15/19 10:36 AM  Result Value Ref Range   Sodium 139 135 - 145 mmol/L   Potassium 4.9 3.5 - 5.1 mmol/L   Chloride 106 98 - 111 mmol/L   CO2 20 (L) 22 - 32 mmol/L   Glucose, Bld 318 (H) 70 - 99 mg/dL   BUN 53 (H) 8 - 23 mg/dL   Creatinine, Ser 2.05 (H) 0.61 - 1.24 mg/dL   Calcium 9.0 8.9 - 10.3 mg/dL   Total Protein 7.1 6.5 - 8.1 g/dL   Albumin 2.7 (L) 3.5 - 5.0 g/dL   AST 25 15 - 41 U/L   ALT 24 0 - 44 U/L   Alkaline Phosphatase 57 38 - 126 U/L   Total  Bilirubin 0.5 0.3 - 1.2 mg/dL   GFR calc non Af Amer 32 (L) >60 mL/min   GFR calc Af Amer 37 (L) >60 mL/min   Anion gap 13 5 - 15    Comment: Performed at [facility] [address] [place], [place] [address]  Lipase, blood     Status: None   Collection Time: 03/15/19 10:36 AM  Result Value  Ref Range   Lipase 30 11 - 51 U/L    Comment: Performed at [facility] [address] [place], [place] [address]  Protime-INR     Status: Abnormal   Collection Time: 03/15/19 10:36 AM  Result Value Ref Range   Prothrombin Time 17.8 (H) 11.4 - 15.2 seconds   INR 1.5 (H) 0.8 - 1.2    Comment: (NOTE) INR goal varies based on device and disease states. Performed at [facility], [place] [address], [place], [place] [address]   Troponin I - ONCE - STAT     Status: None   Collection Time: 03/15/19 10:36 AM  Result Value Ref Range   Troponin I <0.03 <0.03 ng/mL    Comment: Performed at [facility] [address], [place], [place] [address]  SARS Coronavirus 2 (CEPHEID- Performed in [facility] hospital lab), Hosp Order     Status: None   Collection Time: 03/15/19 10:46 AM  Result Value Ref Range   SARS Coronavirus 2 NEGATIVE NEGATIVE    Comment: (NOTE) If result is NEGATIVE SARS-CoV-2 target nucleic acids are NOT DETECTED. The SARS-CoV-2 RNA is generally detectable in upper and lower  respiratory specimens during the acute phase of infection. The lowest  concentration of SARS-CoV-2 viral copies this assay can detect is 250  copies / mL. A negative result does not preclude SARS-CoV-2 infection  and should not be used as the sole basis for treatment or other  patient management decisions.  A negative result may occur with  improper specimen collection / handling, submission of specimen other  than nasopharyngeal swab, presence of viral mutation(s) within the  areas targeted by this assay, and inadequate number of viral copies  (<250 copies / mL). A negative result must  be combined with clinical  observations, patient history, and epidemiological information. If result is POSITIVE SARS-CoV-2 target nucleic acids are DETECTED. The SARS-CoV-2 RNA is generally detectable in upper and lower  respiratory specimens dur ing the acute phase of infection.  Positive  results are indicative of active infection with SARS-CoV-2.  Clinical  correlation with patient history and other diagnostic information is  necessary to determine patient infection status.  Positive results do  not rule out bacterial infection or co-infection with other viruses. If result is PRESUMPTIVE POSTIVE SARS-CoV-2 nucleic acids MAY BE PRESENT.   A presumptive positive result was obtained on the submitted specimen  and confirmed on repeat testing.  While 2019 novel coronavirus  (SARS-CoV-2) nucleic acids may be present in the submitted sample  additional confirmatory testing may be necessary for epidemiological  and / or clinical management purposes  to differentiate between  SARS-CoV-2 and other Sarbecovirus currently known to infect humans.  If clinically indicated additional testing with an alternate test  methodology [phone]) is advised. The SARS-CoV-2 RNA is generally  detectable in upper and lower respiratory sp ecimens during the acute  phase of infection. The expected result is Negative. Fact Sheet for Patients:  StrictlyIdeas.no Fact Sheet for Healthcare Providers: BankingDealers.co.za This test is not yet approved or cleared by the [place] FDA and has been authorized for detection and/or diagnosis of SARS-CoV-2 by FDA under an Emergency Use Authorization (EUA).  This EUA will remain in effect (meaning this test can be used) for the duration of the COVID-19 declaration under Section 564(b)(1) of the Act, 21 U.S.C. section 360bbb-3(b)(1), unless the authorization is terminated or revoked sooner. Performed at [facility], [place]  [address] [place], [place] [address]   POC occult blood, ED     Status: None   Collection Time: 03/15/19 10:55 AM  Result Value Ref Range   Fecal Occult Bld NEGATIVE NEGATIVE   Ct Head Wo Contrast  Result Date: 03/15/2019 CLINICAL DATA:  68 year old male with acute dizziness and ataxia. EXAM: CT HEAD WITHOUT CONTRAST TECHNIQUE: Contiguous axial images were obtained from the base of the skull through the vertex without intravenous contrast. COMPARISON:  None. FINDINGS: Brain: An infarct within the SUPERIOR RIGHT cerebellum appears acute or subacute. There is no evidence of hemorrhage. Mild atrophy and chronic small-vessel white matter ischemic changes are noted. No mass lesion, midline shift, hydrocephalus or extra-axial collection noted. Vascular: Carotid and vertebral atherosclerotic calcifications noted. Skull: Normal. Negative for fracture or focal lesion. Sinuses/Orbits: No acute finding. Other: None. IMPRESSION: 1. Acute to subacute RIGHT cerebellar infarct. No evidence of hemorrhage. 2. Atrophy and chronic small-vessel white matter ischemic changes. Electronically Signed   By: Margarette Canada M.D.   On: 03/15/2019 12:49   Mr Jodene Nam Head Wo Contrast  Result Date: 03/15/2019 CLINICAL DATA:  Dizziness.  Abnormal cord in a shin. EXAM: MRI HEAD WITHOUT CONTRAST MRA HEAD WITHOUT CONTRAST TECHNIQUE: Multiplanar, multiecho pulse sequences of the brain and surrounding structures were obtained without intravenous contrast. Angiographic images of the head were obtained using MRA technique without contrast. COMPARISON:  Head CT same day FINDINGS: MRI HEAD FINDINGS Brain: Diffusion imaging confirms the presence of a 3 cm region of acute infarction in the right superior and lateral cerebellum. Mild swelling but no hemorrhage or mass effect. No other acute infarction. Elsewhere, there chronic small-vessel ischemic changes of the pons. There are moderate chronic small-vessel ischemic changes of the  cerebral hemispheric white matter. No large vessel territory infarction. No mass lesion, hemorrhage, hydrocephalus or extra-axial collection. Vascular: Major vessels at the base of the brain show flow. Skull and upper cervical spine: Negative Sinuses/Orbits: Clear/normal Other: None MRA HEAD FINDINGS Both internal carotid arteries are widely patent through the skull base and siphon regions. The anterior and middle cerebral vessels are patent without proximal stenosis, aneurysm or vascular malformation. Both vertebral arteries are patent at the foramen magnum and to the basilar. No basilar stenosis. Posterior inferior cerebellar arteries show flow on each side. Right anterior inferior cerebellar artery is seen. Both superior cerebellar arteries show flow presently. Both posterior cerebral arteries show flow. IMPRESSION: 3 cm region of acute infarction in the right superolateral cerebellum. No hemorrhage or mass effect. Moderate chronic small-vessel ischemic changes of the pons and cerebral hemispheric white matter. Intracranial MR angiography negative for large or medium vessel occlusion. Specifically, no proximal posterior circulation occlusion seen to explain the infarction. Electronically Signed   By: Nelson Chimes M.D.   On: 03/15/2019 14:56   Mr Brain Wo Contrast  Result Date: 03/15/2019 CLINICAL DATA:  Dizziness.  Abnormal cord in a shin. EXAM: MRI HEAD WITHOUT CONTRAST MRA HEAD WITHOUT CONTRAST TECHNIQUE: Multiplanar, multiecho pulse sequences of the brain and surrounding structures were obtained without intravenous contrast. Angiographic images of the head were obtained using MRA technique without contrast. COMPARISON:  Head CT same day FINDINGS: MRI HEAD FINDINGS Brain: Diffusion imaging confirms the presence of a 3 cm region of acute infarction in the right superior and lateral cerebellum. Mild swelling but no hemorrhage or mass effect. No other acute infarction. Elsewhere, there chronic small-vessel  ischemic changes of the pons. There are moderate chronic small-vessel ischemic changes of the cerebral hemispheric white  matter. No large vessel territory infarction. No mass lesion, hemorrhage, hydrocephalus or extra-axial collection. Vascular: Major vessels at the base of the brain show flow. Skull and upper cervical spine: Negative Sinuses/Orbits: Clear/normal Other: None MRA HEAD FINDINGS Both internal carotid arteries are widely patent through the skull base and siphon regions. The anterior and middle cerebral vessels are patent without proximal stenosis, aneurysm or vascular malformation. Both vertebral arteries are patent at the foramen magnum and to the basilar. No basilar stenosis. Posterior inferior cerebellar arteries show flow on each side. Right anterior inferior cerebellar artery is seen. Both superior cerebellar arteries show flow presently. Both posterior cerebral arteries show flow. IMPRESSION: 3 cm region of acute infarction in the right superolateral cerebellum. No hemorrhage or mass effect. Moderate chronic small-vessel ischemic changes of the pons and cerebral hemispheric white matter. Intracranial MR angiography negative for large or medium vessel occlusion. Specifically, no proximal posterior circulation occlusion seen to explain the infarction. Electronically Signed   By: Nelson Chimes M.D.   On: 03/15/2019 14:56   Dg Chest Port 1 View  Result Date: 03/15/2019 CLINICAL DATA:  Cough.  History of lung cancer. EXAM: PORTABLE CHEST 1 VIEW COMPARISON:  10/23/2018 and CT 06/04/2018 FINDINGS: Lungs are hypoinflated with worsening masslike opacification over the right suprahilar/paramediastinal region with mild mixed interstitial airspace density over the more peripheral right upper lobe/apex. No evidence of effusion. Cardiomediastinal silhouette and remainder of the exam is unchanged. IMPRESSION: Mild interval worsening masslike opacity over the right suprahilar/paramediastinal region likely  patient's known right lung cancer. Minimal mixed interstitial airspace density more peripherally over the right upper lobe/apex. Electronically Signed   By: Marin Olp M.D.   On: 03/15/2019 11:22    Pending Labs [employer] (From admission, onward)    Start     Ordered   Signed and Held  HIV antibody (Routine Testing)  Once,   R     Signed and Held   Signed and Held  Hemoglobin A1c  Tomorrow morning,   R     Signed and Held   Signed and Held  Lipid panel  Tomorrow morning,   R    Comments:  Fasting    Signed and Held   Signed and Held  Comprehensive metabolic panel  Tomorrow morning,   R     Signed and Held   Signed and Held  CBC  Tomorrow morning,   R     Signed and Held          Vitals/Pain Today's Vitals   03/15/19 1130 03/15/19 1145 03/15/19 1200 03/15/19 1621  BP: 124/61 130/64 (!) 143/65 (!) 151/120  Pulse: 71 69 68 82  Resp: 18 16 16  (!) 5  Temp:      TempSrc:      SpO2: 97% 98% 97% 96%  Weight:      Height:      PainSc:        Isolation Precautions Droplet and Contact precautions  Medications Medications  amLODipine (NORVASC) tablet 10 mg (has no administration in time range)  furosemide (LASIX) tablet 40 mg (has no administration in time range)  metoprolol tartrate (LOPRESSOR) tablet 100 mg (has no administration in time range)  ipratropium-albuterol (DUONEB) 0.5-2.5 (3) MG/3ML nebulizer solution 3 mL (has no administration in time range)  benzonatate (TESSALON) capsule 100 mg (has no administration in time range)  ondansetron (ZOFRAN) injection 4 mg (4 mg Intravenous Given 03/15/19 1043)  sodium chloride 0.9 % bolus 500 mL (0 mLs Intravenous  Stopped 03/15/19 1212)  albuterol (VENTOLIN HFA) 108 (90 Base) MCG/ACT inhaler 2 puff (2 puffs Inhalation Given 03/15/19 1624)    Mobility walks Moderate fall risk   Focused Assessments Neuro Assessment Handoff:  Swallow screen pass? Yes    NIH Stroke Scale ( + Modified Stroke Scale Criteria)  Interval:  Shift assessment Level of Consciousness (1a.)   : Alert, keenly responsive LOC Questions (1b. )   +: Answers both questions correctly LOC Commands (1c. )   + : Performs both tasks correctly Best Gaze (2. )  +: Normal Visual (3. )  +: No visual loss Facial Palsy (4. )    : Normal symmetrical movements Motor Arm, Left (5a. )   +: No drift Motor Arm, Right (5b. )   +: No drift Motor Leg, Left (6a. )   +: No drift Motor Leg, Right (6b. )   +: No drift Limb Ataxia (7. ): Absent Sensory (8. )   +: Normal, no sensory loss Best Language (9. )   +: No aphasia Dysarthria (10. ): Mild-to-moderate dysarthria, patient slurs at least some words and, at worst, can be understood with some difficulty Extinction/Inattention (11.)   +: No Abnormality Modified SS Total  +: 0 Complete NIHSS TOTAL: 1 Last date known well: 03/14/19 Last time known well: 2200 Neuro Assessment: Exceptions to WDL(Dizziness) Neuro Checks:   Initial (03/15/19 1005)  Last Documented NIHSS Modified Score: 0 (03/15/19 1005) Has TPA been given? No If patient is a Neuro Trauma and patient is going to OR before floor call report to [place] nurse: [phone] or [phone]     R Recommendations: See Admitting Provider Note  Report given to: Aaron Edelman  Additional Notes:

## 2019-03-15 NOTE — ED Notes (Signed)
Pt placed on 2L [place] for O2 saturations consistently at 89%-92%.

## 2019-03-15 NOTE — ED Triage Notes (Addendum)
Pt arrives to ED from home with complaints of nausea, vomiting, and dizziness starting last night at 0100 (5/4) where patient was woken up from sleep by cough then starting vomiting the rest of the night. Pt reports his vomit was black and he had over 10 episodes but denies any pain. Also pt has stated that he is having trouble saying words, says his speech changed sometime last night (5/3) around 2200.

## 2019-03-15 NOTE — ED Provider Notes (Signed)
  Physical Exam  BP (!) 143/65   Pulse 68   Temp 97.9 F (36.6 C) (Oral)   Resp 16   Ht 6' (1.829 m)   Wt 95.3 kg   SpO2 97%   BMI 28.48 kg/m   Physical Exam  ED Course/Procedures     Procedures  MDM  Patient here with strokelike symptoms since 1 AM last night.  He is currently stable.  Initial CT showing acute or subacute right-sided infarct. Neurology was consulted. hospitalist accepted for admission  Waiting on MRI and admission. No LVO. Hx of COPD, patient oxygen sats in high 80s and low 90s. Currently stable.      Alveria Apley, PA-C 03/15/19 1417    Tegeler, Gwenyth Allegra, MD 03/15/19 [phone]

## 2019-03-15 NOTE — Progress Notes (Signed)
Patient received to unit at 1830

## 2019-03-15 NOTE — ED Notes (Signed)
Notified Dr Marylyn Ishihara, patient decreased oxygenation to 88 %, placed on 2L [place] to increase oxygenation, expiratory wheezing with minimal movement in Bed.  No other changes.

## 2019-03-15 NOTE — ED Notes (Signed)
Otila Kluver (wife), [phone] would like to receive updates

## 2019-03-16 ENCOUNTER — Inpatient Hospital Stay (HOSPITAL_COMMUNITY): Payer: Medicare Other

## 2019-03-16 DIAGNOSIS — I639 Cerebral infarction, unspecified: Secondary | ICD-10-CM

## 2019-03-16 DIAGNOSIS — I63441 Cerebral infarction due to embolism of right cerebellar artery: Principal | ICD-10-CM

## 2019-03-16 LAB — COMPREHENSIVE METABOLIC PANEL
ALT: 17 U/L (ref 0–44)
AST: 17 U/L (ref 15–41)
Albumin: 2.2 g/dL — ABNORMAL LOW (ref 3.5–5.0)
Alkaline Phosphatase: 48 U/L (ref 38–126)
Anion gap: 9 (ref 5–15)
BUN: 42 mg/dL — ABNORMAL HIGH (ref 8–23)
CO2: 24 mmol/L (ref 22–32)
Calcium: 8.6 mg/dL — ABNORMAL LOW (ref 8.9–10.3)
Chloride: 111 mmol/L (ref 98–111)
Creatinine, Ser: 1.6 mg/dL — ABNORMAL HIGH (ref 0.61–1.24)
GFR calc Af Amer: 50 mL/min — ABNORMAL LOW (ref >60)
GFR calc non Af Amer: 43 mL/min — ABNORMAL LOW (ref >60)
Glucose, Bld: 164 mg/dL — ABNORMAL HIGH (ref 70–99)
Potassium: 4.9 mmol/L (ref 3.5–5.1)
Sodium: 144 mmol/L (ref 135–145)
Total Bilirubin: 0.5 mg/dL (ref 0.3–1.2)
Total Protein: 5.6 g/dL — ABNORMAL LOW (ref 6.5–8.1)

## 2019-03-16 LAB — ECHOCARDIOGRAM LIMITED
Height: 72 in
Weight: 3360 oz

## 2019-03-16 LAB — CBC
HCT: 40.4 % (ref 39.0–52.0)
Hemoglobin: 12.4 g/dL — ABNORMAL LOW (ref 13.0–17.0)
MCH: 26.6 pg (ref 26.0–34.0)
MCHC: 30.7 g/dL (ref 30.0–36.0)
MCV: 86.7 fL (ref 80.0–100.0)
Platelets: 261 10*3/uL (ref 150–400)
RBC: 4.66 MIL/uL (ref 4.22–5.81)
RDW: 14.2 % (ref 11.5–15.5)
WBC: 10.2 10*3/uL (ref 4.0–10.5)
nRBC: 0 % (ref 0.0–0.2)

## 2019-03-16 LAB — LIPID PANEL
Cholesterol: 90 mg/dL (ref 0–200)
HDL: 25 mg/dL — ABNORMAL LOW (ref >40)
LDL Cholesterol: 46 mg/dL (ref 0–99)
Total CHOL/HDL Ratio: 3.6 RATIO
Triglycerides: 95 mg/dL (ref <150)
VLDL: 19 mg/dL (ref 0–40)

## 2019-03-16 LAB — HIV ANTIBODY (ROUTINE TESTING W REFLEX): HIV Screen 4th Generation wRfx: NONREACTIVE

## 2019-03-16 LAB — GLUCOSE, CAPILLARY: Glucose-Capillary: 189 mg/dL — ABNORMAL HIGH (ref 70–99)

## 2019-03-16 LAB — HEMOGLOBIN A1C
Hgb A1c MFr Bld: 8.1 % — ABNORMAL HIGH (ref 4.8–5.6)
Mean Plasma Glucose: 185.77 mg/dL

## 2019-03-16 MED ORDER — ATORVASTATIN CALCIUM 40 MG PO TABS
40.0000 mg | ORAL_TABLET | Freq: Every day | ORAL | Status: DC
  Filled 2019-03-16: qty 1

## 2019-03-16 MED ORDER — PROMETHAZINE-CODEINE 6.25-10 MG/5ML PO SYRP: 5.0000 mL | ORAL_SOLUTION | Freq: Four times a day (QID) | ORAL | Status: DC | PRN

## 2019-03-16 MED ORDER — INSULIN ASPART 100 UNIT/ML ~~LOC~~ SOLN
0.0000 [IU] | Freq: Three times a day (TID) | SUBCUTANEOUS | Status: DC
  Administered 2019-03-16 – 2019-03-17 (×4): 3 [IU] via SUBCUTANEOUS
  Administered 2019-03-18: 2 [IU] via SUBCUTANEOUS
  Administered 2019-03-18: 5 [IU] via SUBCUTANEOUS
  Administered 2019-03-19: 3 [IU] via SUBCUTANEOUS
  Administered 2019-03-19: 2 [IU] via SUBCUTANEOUS

## 2019-03-16 MED ORDER — PERFLUTREN LIPID MICROSPHERE
1.0000 mL | INTRAVENOUS | Status: AC | PRN
  Administered 2019-03-16: 5 mL via INTRAVENOUS
  Filled 2019-03-16: qty 10

## 2019-03-16 MED ORDER — INSULIN ASPART 100 UNIT/ML ~~LOC~~ SOLN
0.0000 [IU] | Freq: Every day | SUBCUTANEOUS | Status: DC
  Administered 2019-03-18: 2 [IU] via SUBCUTANEOUS

## 2019-03-16 MED FILL — Perflutren Lipid Microsphere IV Susp 1.1 MG/ML: INTRAVENOUS | Qty: 10 | Status: AC

## 2019-03-16 NOTE — Progress Notes (Signed)
SLP Cancellation Note  Patient Details Name: Travis [name] MRN: [id number] DOB: [birth year]   Cancelled treatment:       Reason Eval/Treat Not Completed: Patient at procedure or test/unavailable(Pt having echo at this time per RN. SLP will follow up. )  Jestine Bicknell I. Hardin Negus, [place], [place] Office number [phone] Pager [place] 03/16/2019, 8:51 AM

## 2019-03-16 NOTE — Progress Notes (Signed)
Rehab Admissions Coordinator Note:  Patient was screened by Cleatrice Burke for appropriateness for an Inpatient Acute Rehab Consult per PT recs..  At this time, we are recommending Inpatient Rehab consult.  Cleatrice Burke RN MSN 03/16/2019, 5:04 PM  I can be reached at [phone].

## 2019-03-16 NOTE — Evaluation (Signed)
Physical Therapy Evaluation Patient Details Name: Travis [name] MRN: [id number] DOB: [birth year] Today's Date: 03/16/2019   History of Present Illness  Travis [name] is an 68 y.o. male who presents to the ED today with symptoms of dizziness with N/V since early this AM at 0100.  Of note, O2 saturations in the ED were 89-92%, so he was started on 2L [place]. 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 CT head revealed a subacute right cerebellar hemisphere ischemic infarction  Clinical Impression  Pt admitted secondary to problem above with deficits below. Pt with ataxic gait (RLE worse than LLE) and required mod A +2 for steadying for short distance ambulation. Also presenting with decreased coordination in [place]. Pt was previously independent and lived at home with spouse. Feel pt is excellent candidate for CIR as pt very motivated to regain independence. Will continue to follow acutely to maximize functional mobility independence and safety.     Follow Up Recommendations CIR    Equipment Recommendations  None recommended by PT    Recommendations for Other Services Rehab consult     Precautions / Restrictions Precautions Precautions: Fall Restrictions Weight Bearing Restrictions: No      Mobility  Bed Mobility Overal bed mobility: Needs Assistance Bed Mobility: Supine to Sit;Sit to Supine     Supine to sit: Supervision;HOB elevated Sit to supine: Supervision;HOB elevated   General bed mobility comments: HOB elevated as pt reports he becomes dizzy (and has had N/V in past 24 hours) when moving supine to sit.  Pt needs close supervision.    Transfers Overall transfer level: Needs assistance Equipment used: 2 person hand held assist Transfers: Sit to/from Stand Sit to Stand: Mod assist;+2 physical assistance         General transfer comment: Mod A +2 for lift assist and steadying to stand. Heavy   reliance on external and UE support.   Ambulation/Gait Ambulation/Gait assistance: Mod assist;+2 physical assistance Gait Distance (Feet): 25 Feet Assistive device: 2 person hand held assist Gait Pattern/deviations: Step-through pattern;Decreased stride length;Ataxic;Drifts right/left;Staggering right;Staggering left Gait velocity: Decreased    General Gait Details: Very ataxic gait. mod a x2 for functional ambulation, 2 person hand held assist. BLE ataxia, R greater than L, poor perception of midline, poor abilty to determine direction of LOB however is aware he is falling. Multiple LOB noted. Some evidence of attempted step strategy with LOB however ineffective  Stairs            Wheelchair Mobility    Modified Rankin (Stroke Patients Only) Modified Rankin (Stroke Patients Only) Pre-Morbid Rankin Score: No symptoms Modified Rankin: Moderately severe disability     Balance Overall balance assessment: Needs assistance Sitting-balance support: Feet unsupported;No upper extremity supported Sitting balance-Leahy Scale: Fair     Standing balance support: Bilateral upper extremity supported Standing balance-Leahy Scale: Poor Standing balance comment: Reliant on external and UE support                              Pertinent Vitals/Pain Pain Assessment: No/denies pain    Home Living Family/patient expects to be discharged to:: Private residence Living Arrangements: Spouse/significant other Available Help at Discharge: Available 24 hours/day Type of Home: House Home Access: Stairs to enter Entrance Stairs-Rails: Right;Left;Can reach both Entrance Stairs-Number of Steps: 3 in the front, 1 in the back.  railing on  both sides in back Home Layout: One level Home Equipment: None      Prior Function Level of Independence: Independent               Hand Dominance   Dominant Hand: Right    Extremity/Trunk Assessment   Upper Extremity Assessment Upper  Extremity Assessment: Defer to OT evaluation    Lower Extremity Assessment Lower Extremity Assessment: RLE deficits/detail;LLE deficits/detail RLE Deficits / Details: Ataxia noted during gait (RLE>LLE). Strength WFL  RLE Coordination: decreased gross motor;decreased fine motor LLE Deficits / Details: Ataxia noted during gait (RLE>LLE). Strength WFL  LLE Coordination: decreased fine motor;decreased gross motor    Cervical / Trunk Assessment Cervical / Trunk Assessment: Normal  Communication   Communication: Expressive difficulties  Cognition Arousal/Alertness: Awake/alert Behavior During Therapy: WFL for tasks assessed/performed Overall Cognitive Status: Within Functional Limits for tasks assessed                                 General Comments: Pt demonstrates awareness and safety stating I can't get up by myself I need at least one if not two people to help      General Comments      Exercises     Assessment/Plan    PT Assessment Patient needs continued PT services  PT Problem List Decreased balance;Decreased mobility;Decreased coordination;Decreased knowledge of use of DME       PT Treatment Interventions DME instruction;Gait training;Functional mobility training;Therapeutic exercise;Balance training;Therapeutic activities;Neuromuscular re-education;Wheelchair mobility training;Stair training    PT Goals (Current goals can be found in the Care Plan section)  Acute Rehab PT Goals Patient Stated Goal: I want to be able to walk again and go home when I can PT Goal Formulation: With patient Time For Goal Achievement: 03/30/19 Potential to Achieve Goals: Good    Frequency Min 4X/week   Barriers to discharge        Co-evaluation PT/OT/SLP Co-Evaluation/Treatment: Yes Reason for Co-Treatment: To address functional/ADL transfers;Complexity of the patient's impairments (multi-system involvement);For patient/therapist safety PT goals addressed during  session: Mobility/safety with mobility;Balance         AM-PAC PT "6 Clicks" Mobility  Outcome Measure Help needed turning from your back to your side while in a flat bed without using bedrails?: A Little Help needed moving from lying on your back to sitting on the side of a flat bed without using bedrails?: A Little Help needed moving to and from a bed to a chair (including a wheelchair)?: A Lot Help needed standing up from a chair using your arms (e.g., wheelchair or bedside chair)?: A Lot Help needed to walk in hospital room?: A Lot Help needed climbing 3-5 steps with a railing? : Total 6 Click Score: 13    End of Session Equipment Utilized During Treatment: Gait belt Activity Tolerance: Patient tolerated treatment well Patient left: in bed;with call bell/phone within reach;with bed alarm set Nurse Communication: Mobility status PT Visit Diagnosis: Unsteadiness on feet (R26.81);Other abnormalities of gait and mobility (R26.89);Difficulty in walking, not elsewhere classified (R26.2);Ataxic gait (R26.0)    Time: 1053-1110 PT Time Calculation (min) (ACUTE ONLY): 17 min   Charges:   PT Evaluation $PT Eval Moderate Complexity: [place], PT, DPT  Acute Rehabilitation Services  Pager: [phone] Office: [phone]   [name] 03/16/2019, 4:41 PM

## 2019-03-16 NOTE — TOC Initial Note (Signed)
Transition of Care [facility]) - Initial/Assessment Note    Patient Details  Name: Travis [name] MRN: [id number] Date of Birth: 07/24/1951  [facility] CM/SW Contact:    Pollie Friar, RN Phone Number: 03/16/2019, 2:20 PM  Clinical Narrative:                 Pt denies issues with obtaining or taking home meds.  Pt denies issues with transportation.  Expected Discharge Plan: IP Rehab Facility Barriers to Discharge: Continued Medical Work up   Patient Goals and CMS Choice        Expected Discharge Plan and Services Expected Discharge Plan: [place]       Living arrangements for the past 2 months: Single Family Home(one level)                                      Prior Living Arrangements/Services Living arrangements for the past 2 months: Single Family Home(one level) Lives with:: Spouse Patient language and need for interpreter reviewed:: Yes(no needs) Do you feel safe going back to the place where you live?: Yes      Need for Family Participation in Patient Care: Yes (Comment) Care giver support system in place?: Yes (comment)(pt states wife is able to provide 24 hour assistance and supervision)   Criminal Activity/Legal Involvement Pertinent to Current Situation/Hospitalization: No - Comment as needed  Activities of Daily Living Home Assistive Devices/Equipment: None ADL Screening (condition at time of admission) Patient's cognitive ability adequate to safely complete daily activities?: Yes Is the patient deaf or have difficulty hearing?: No Does the patient have difficulty seeing, even when wearing glasses/contacts?: No Does the patient have difficulty concentrating, remembering, or making decisions?: No Patient able to express need for assistance with ADLs?: Yes Does the patient have difficulty dressing or bathing?: Yes Independently performs ADLs?: No Communication: Independent Dressing (OT): Needs assistance Is this a change from  baseline?: Change from baseline, expected to last <3days Grooming: Needs assistance Is this a change from baseline?: Change from baseline, expected to last <3 days Feeding: Independent Bathing: Needs assistance Is this a change from baseline?: Change from baseline, expected to last <3 days Toileting: Needs assistance Is this a change from baseline?: Change from baseline, expected to last <3 days In/Out Bed: Needs assistance Is this a change from baseline?: Change from baseline, expected to last <3 days Walks in Home: Independent Does the patient have difficulty walking or climbing stairs?: No Weakness of Legs: Both Weakness of Arms/Hands: None  Permission Sought/Granted                  Emotional Assessment Appearance:: Appears stated age Attitude/Demeanor/Rapport: Engaged Affect (typically observed): Accepting, Appropriate, Pleasant Orientation: : Oriented to Self, Oriented to Place, Oriented to  Time, Oriented to Situation   Psych Involvement: No (comment)  Admission diagnosis:  Cerebellar infarct ([place]) [I63.9] CVA (cerebral vascular accident) [facility]) [I63.9] Patient Active Problem List   Diagnosis Date Noted  . CVA (cerebral vascular accident) ([place]) 03/15/2019  . Nausea & vomiting 03/15/2019  . Squamous cell carcinoma of lungs, bilateral ([place]) 06/11/2018  . Flexor tendon laceration, finger, open wound 07/29/2012  . PVD 12/08/2009  . DIAB W/RENAL MANIFESTS TYPE I [JUV] NOT UNCNTRL 09/08/2009  . HYPERCHOLESTEROLEMIA 09/08/2009  . TOBACCO ABUSE 09/08/2009  . Essential hypertension, benign 09/08/2009  . CORONARY ATHEROSCLEROSIS NATIVE CORONARY ARTERY 09/08/2009  . CKD (  chronic kidney disease) stage 3, GFR 30-59 ml/min (HCC) 09/08/2009   PCP:  [name], MD Pharmacy:   [facility] [place], [place] [address] Phone: [phone] Fax:  [phone]  [place], [place] - [address] [place] [address] Phone: [phone] Fax: [phone]     Social Determinants of Health (SDOH) Interventions    Readmission Risk Interventions No flowsheet data found.

## 2019-03-16 NOTE — Evaluation (Signed)
Speech Language Pathology Evaluation Patient Details Name: Travis [name] MRN: [id number] DOB: 07-21-1951 Today's Date: 03/16/2019 Time: 0350-0938 SLP Time Calculation (min) (ACUTE ONLY): 28 min  Problem List:  Patient Active Problem List   Diagnosis Date Noted  . CVA (cerebral vascular accident) ([place]) 03/15/2019  . Nausea & vomiting 03/15/2019  . Squamous cell carcinoma of lungs, bilateral ([place]) 06/11/2018  . Flexor tendon laceration, finger, open wound 07/29/2012  . PVD 12/08/2009  . DIAB W/RENAL MANIFESTS TYPE I [JUV] NOT UNCNTRL 09/08/2009  . HYPERCHOLESTEROLEMIA 09/08/2009  . TOBACCO ABUSE 09/08/2009  . Essential hypertension, benign 09/08/2009  . CORONARY ATHEROSCLEROSIS NATIVE CORONARY ARTERY 09/08/2009  . CKD (chronic kidney disease) stage 3, GFR 30-59 ml/min ([place]) 09/08/2009   Past Medical History:  Past Medical History:  Diagnosis Date  . CAD (coronary artery disease)   . Cancer ([place])    skin cancer face  . COPD (chronic obstructive pulmonary disease) ([place])   . Coronary artery disease    PCA 1994l; PCI august 2003 of an obtust marginal. Catheterization at the time reveal: left main normal; LAD 50-75% between first and second diagonals, 50% after lesion; left circumflex 95% in obtuse marg. RCA 100% proximal.  . Diabetes mellitus    type 2  . Hyperlipemia   . Hypertension   . Kidney disease    chronic; stage 2 followed By Dr Deterding  . Myocardial infarction ([place])   . Osteoarthritis   . Peripheral vascular disease ([place])   . Tobacco abuse    Past Surgical History:  Past Surgical History:  Procedure Laterality Date  . APPENDECTOMY    . CHOLECYSTECTOMY    . CORONARY ANGIOPLASTY     stent times 2, last cath 3 years ago  . FUDUCIAL PLACEMENT N/A 06/09/2018   Procedure: possible, PLACEMENT OF FUDUCIAL;  Surgeon: Grace Isaac, MD;  Location: [facility];  Service: Thoracic;  Laterality: N/A;  . NERVE EXPLORATION  07/29/2012   Procedure: NERVE EXPLORATION;  Surgeon:  Schuyler Amor, MD;  Location: [facility];  Service: Orthopedics;  Laterality: Left;  Exploration /repair nerve and tendon left thumb  . PERIPHERAL VASCULAR CATHETERIZATION N/A 04/26/2015   Procedure: Abdominal Aortogram;  Surgeon: Rosetta Posner, MD;  Location: [facility];  Service: Cardiovascular;  Laterality: N/A;  . VIDEO BRONCHOSCOPY N/A 06/09/2018   Procedure: VIDEO BRONCHOSCOPY;  Surgeon: Grace Isaac, MD;  Location: [facility];  Service: Thoracic;  Laterality: N/A;  . VIDEO BRONCHOSCOPY WITH ENDOBRONCHIAL NAVIGATION N/A 06/09/2018   Procedure: VIDEO BRONCHOSCOPY WITH ENDOBRONCHIAL NAVIGATION;  Surgeon: Grace Isaac, MD;  Location: [facility];  Service: Thoracic;  Laterality: N/A;   HPI:  Pt  is a 68 y.o. male with medical history significant of DM2, HLD, COPD, CAD who presented with nausea, vomiting, difficulty expressing his words, and dizziness. MRI of the brain revealed 3 cm region of acute infarction in the right superolateral cerebellum.   Assessment / Plan / Recommendation Clinical Impression  Pt is currently retired and he denied any baseline deficits in speech, language, or cognition. He described his speech difficulty and "trouble getting speech out" and stated that his speech is currently 30% back to his baseline. The [facility] Cognitive Assessment 8.1 was completed to evaluate the pt's cognitive-linguistic skills. He achieved a score of 27/30 which is within the normal limits of 26 or more out of 30  and his language and cognitive skills appeared within normal limits. He did demonstrate moderate dysarthria characterized by reduced  articulatory precision which negatively impacted speech intelligibility at the sentence and conversational levels. Skilled SLP services are clinically indicated at this time to improve dysarthria. Pt and nursing were educated regarding results and recommendations; both parties verbalized understanding as well as agreement with plan of  care.    SLP Assessment  SLP Recommendation/Assessment: Patient needs continued Speech Lanaguage Pathology Services SLP Visit Diagnosis: Dysarthria and anarthria (R47.1)    Follow Up Recommendations  Other (comment)(Continued SLP services at level of care recommended by PT/OT)    Frequency and Duration min 2x/week  2 weeks      SLP Evaluation Cognition  Overall Cognitive Status: Within Functional Limits for tasks assessed Arousal/Alertness: Awake/alert Orientation Level: Oriented X4 Attention: Focused;Sustained Focused Attention: Appears intact(Vigilance WNL: 1/1) Sustained Attention: Appears intact(Serial 7s: 3/3) Memory: Impaired Memory Impairment: Retrieval deficit;Decreased recall of new information(Immediate: 4/5; Delayed: 2/5; with cues: 3/3 ) Awareness: Appears intact Problem Solving: Appears intact(5/5) Executive Function: Reasoning;Sequencing;Organizing Reasoning: Appears intact(2/2) Sequencing: Appears intact(Clock drawing: 3/3) Organizing: Appears intact(Backward digit span: 1/1)       Comprehension  Auditory Comprehension Overall Auditory Comprehension: Appears within functional limits for tasks assessed Yes/No Questions: Within Functional Limits Commands: Within Functional Limits(Complex commands- trail completion: 1/1) Conversation: Complex Reading Comprehension Reading Status: Not tested    Expression Expression Primary Mode of Expression: Verbal Verbal Expression Overall Verbal Expression: Appears within functional limits for tasks assessed Initiation: No impairment Level of Generative/Spontaneous Verbalization: Sentence;Conversation Repetition: No impairment(Sentences: 2/2) Naming: No impairment Responsive: Not tested Confrontation: Within functional limits(3/3) Convergent: Not tested Divergent: (1/1) Written Expression Dominant Hand: Right Written Expression: (Difficulty copying cube: 0/1)   Oral / Motor  Oral Motor/Sensory Function Overall  Oral Motor/Sensory Function: Mild impairment Facial ROM: Within Functional Limits Facial Symmetry: Abnormal symmetry right;Suspected CN VII (facial) dysfunction Facial Strength: Within Functional Limits Facial Sensation: Within Functional Limits Lingual ROM: Within Functional Limits Lingual Symmetry: Within Functional Limits Lingual Strength: Reduced;Suspected CN XII (hypoglossal) dysfunction Lingual Sensation: Within Functional Limits Velum: Within Functional Limits Motor Speech Overall Motor Speech: Impaired Respiration: Within functional limits Phonation: Normal Resonance: Within functional limits Articulation: Impaired Level of Impairment: Sentence Intelligibility: Intelligibility reduced Word: 75-100% accurate Phrase: 75-100% accurate Sentence: 75-100% accurate Conversation: 50-74% accurate Motor Planning: Witnin functional limits Motor Speech Errors: Aware   Nishita Isaacks I. Hardin Negus, [place], [place] Office number [phone] Pager [place] 03/16/2019, 10:31 AM

## 2019-03-16 NOTE — Progress Notes (Addendum)
[name]  PROGRESS NOTE    Travis [name]  ELF:[id number] DOB: 1951/06/25 DOA: 03/15/2019 PCP: [name], MD   Brief Narrative:   Travis [name] is a 68 y.o. male with medical history significant of DM2, HLD, COPD, CAD; presents with nausea, vomiting, and dizziness. He notes that at about 0100hrs on 03/15/2019 he felt acutely nauseous. As he tried to get up, he became dizzy and began to vomit. He had several episodes of emesis that were accompanied by uncontrolled coughing. The vomitus was dark, but no blood noted. He felt off balance and his right arm became weak. He also notes that he had problems with expressing his words that started around 2200hrs 03/14/2019. He denies a syncopal episode and also notes that no one in the house was awake to witness this event. He became concerned about his symptoms can came to [facility] ED. Pt evaled by ED staff. Noted to have acute CVA on imaging.     Assessment & Plan:   Principal Problem:   CVA (cerebral vascular accident) ([place]) Active Problems:   HYPERCHOLESTEROLEMIA   Essential hypertension, benign   CORONARY ATHEROSCLEROSIS NATIVE CORONARY ARTERY   PVD   CKD (chronic kidney disease) stage 3, GFR 30-59 ml/min (HCC)   Nausea & vomiting   CVA     - neurology onboard     - CTH:  Acute to subacute RIGHT cerebellar infarct. No evidence of hemorrhage.     - MRI/MRA brain: IMPRESSION: 3 cm region of acute infarction in the right superolateral cerebellum. No hemorrhage or mass effect. Moderate chronic small-vessel ischemic changes of the pons and cerebral hemispheric white matter. Intracranial MR angiography negative for large or medium vessel occlusion. Specifically, no proximal posterior circulation occlusion seen to explain the infarction.     - Echo: 1. The left ventricle has normal systolic function, with an ejection fraction of 60-65%. The cavity size was normal. There is moderately increased left ventricular wall thickness. Left ventricular diastolic Doppler  parameters are consistent with  impaired relaxation. 2. The right ventricle has normal systolc function. The cavity was normal. 3. The aortic valve is tricuspid Mild calcification of the aortic valve. No stenosis of the aortic valve. 4. The inferior vena cava was dilated in size with >50% respiratory variability. 5. Technically difficult; definity used; normal LV systolic function; moderate LVH; probable mild diastolic dysfunction; calcified aortic valve with reduced excursion of left coronary cusp but no AS by doppler.     - Carotid dopplers pending     - A1c 8.1     - Tchol: 90, HDL: 25, LDL: 46      - Consult PT/OT/SLP; NPO until swallow screen completed      - ASA 81mg   HTN     - amlodipine 10mg  qday, benazepril 40mg  qday, lasix  HLD     - lipitor 40mg   CAD     - ASA, statin, BB     - eliquis  PVD     - see above  CKD stage 3     - watch nephrotoxins; monitor     - Scr 1.6 this AM  N/V     - related to CVA     - antiemetics PRN, fluids     - resolved  DM2     - takes toujeo at home and humalog     - SSI     - A1c 8.1   DVT prophylaxis: SCDs, eliquis Code Status: FULL Family Communication: Update to  wife at 1515 hrs. Disposition Plan: To CIR?   Consultants:   Neurology  Procedures:   None  Antimicrobials:   None    Subjective: "I'm feeling fine."  Objective: Vitals:   03/15/19 2200 03/16/19 0000 03/16/19 0307 03/16/19 0731  BP: (!) 128/51 (!) 116/97 (!) 141/74   Pulse: 75 71 73   Resp:  18 18   Temp: 98.2 F (36.8 C) 98 F (36.7 C) 97.6 F (36.4 C)   TempSrc: Oral Oral Oral   SpO2: 95% 96% 93% 90%  Weight:      Height:        Intake/Output Summary (Last 24 hours) at 03/16/2019 0805 Last data filed at 03/16/2019 0600 Gross per 24 hour  Intake 500 ml  Output 1350 ml  Net -850 ml   Filed Weights   03/15/19 0955  Weight: 95.3 kg    Examination:  General: 68 y.o. male resting in bed in NAD Cardiovascular: RRR, +S1, S2, no  m/g/r, equal pulses throughout Respiratory: CTABL, no w/r/r, normal WOB GI: BS+, NDNT, no masses noted, no organomegaly noted MSK: No e/c/c Skin: No rashes, bruises, ulcerations noted Neuro: A&O x 3, no focal deficits Psyc: Appropriate interaction and affect, calm/cooperative    Data Reviewed: I have personally reviewed following labs and imaging studies.  CBC: Recent Labs  Lab 03/15/19 1036 03/16/19 0617  WBC 9.6 10.2  NEUTROABS 8.6*  --   HGB 14.1 12.4*  HCT 45.6 40.4  MCV 86.5 86.7  PLT 277 053   Basic Metabolic Panel: Recent Labs  Lab 03/15/19 1036 03/16/19 0617  NA 139 144  K 4.9 4.9  CL 106 111  CO2 20* 24  GLUCOSE 318* 164*  BUN 53* 42*  CREATININE 2.05* 1.60*  CALCIUM 9.0 8.6*   GFR: Estimated Creatinine Clearance: 52.2 mL/min (A) (by C-G formula based on SCr of 1.6 mg/dL (H)). Liver Function Tests: Recent Labs  Lab 03/15/19 1036 03/16/19 0617  AST 25 17  ALT 24 17  ALKPHOS 57 48  BILITOT 0.5 0.5  PROT 7.1 5.6*  ALBUMIN 2.7* 2.2*   Recent Labs  Lab 03/15/19 1036  LIPASE 30   No results for input(s): AMMONIA in the last 168 hours. Coagulation Profile: Recent Labs  Lab 03/15/19 1036  INR 1.5*   Cardiac Enzymes: Recent Labs  Lab 03/15/19 1036  TROPONINI <0.03   BNP (last 3 results) No results for input(s): PROBNP in the last 8760 hours. HbA1C: No results for input(s): HGBA1C in the last 72 hours. CBG: No results for input(s): GLUCAP in the last 168 hours. Lipid Profile: Recent Labs    03/16/19 0617  CHOL 90  HDL 25*  LDLCALC 46  TRIG 95  CHOLHDL 3.6   Thyroid Function Tests: No results for input(s): TSH, T4TOTAL, FREET4, T3FREE, THYROIDAB in the last 72 hours. Anemia Panel: No results for input(s): VITAMINB12, FOLATE, FERRITIN, TIBC, IRON, RETICCTPCT in the last 72 hours. Sepsis Labs: No results for input(s): PROCALCITON, LATICACIDVEN in the last 168 hours.  Recent Results (from the past 240 hour(s))  SARS Coronavirus  2 (CEPHEID- Performed in [place] hospital lab), Hosp Order     Status: None   Collection Time: 03/15/19 10:46 AM  Result Value Ref Range Status   SARS Coronavirus 2 NEGATIVE NEGATIVE Final    Comment: (NOTE) If result is NEGATIVE SARS-CoV-2 target nucleic acids are NOT DETECTED. The SARS-CoV-2 RNA is generally detectable in upper and lower  respiratory specimens during the acute phase of  infection. The lowest  concentration of SARS-CoV-2 viral copies this assay can detect is 250  copies / mL. A negative result does not preclude SARS-CoV-2 infection  and should not be used as the sole basis for treatment or other  patient management decisions.  A negative result may occur with  improper specimen collection / handling, submission of specimen other  than nasopharyngeal swab, presence of viral mutation(s) within the  areas targeted by this assay, and inadequate number of viral copies  (<250 copies / mL). A negative result must be combined with clinical  observations, patient history, and epidemiological information. If result is POSITIVE SARS-CoV-2 target nucleic acids are DETECTED. The SARS-CoV-2 RNA is generally detectable in upper and lower  respiratory specimens dur ing the acute phase of infection.  Positive  results are indicative of active infection with SARS-CoV-2.  Clinical  correlation with patient history and other diagnostic information is  necessary to determine patient infection status.  Positive results do  not rule out bacterial infection or co-infection with other viruses. If result is PRESUMPTIVE POSTIVE SARS-CoV-2 nucleic acids MAY BE PRESENT.   A presumptive positive result was obtained on the submitted specimen  and confirmed on repeat testing.  While 2019 novel coronavirus  (SARS-CoV-2) nucleic acids may be present in the submitted sample  additional confirmatory testing may be necessary for epidemiological  and / or clinical management purposes  to  differentiate between  SARS-CoV-2 and other Sarbecovirus currently known to infect humans.  If clinically indicated additional testing with an alternate test  methodology [phone]) is advised. The SARS-CoV-2 RNA is generally  detectable in upper and lower respiratory sp ecimens during the acute  phase of infection. The expected result is Negative. Fact Sheet for Patients:  StrictlyIdeas.no Fact Sheet for Healthcare Providers: BankingDealers.co.za This test is not yet approved or cleared by the [place] FDA and has been authorized for detection and/or diagnosis of SARS-CoV-2 by FDA under an Emergency Use Authorization (EUA).  This EUA will remain in effect (meaning this test can be used) for the duration of the COVID-19 declaration under Section 564(b)(1) of the Act, 21 U.S.C. section 360bbb-3(b)(1), unless the authorization is terminated or revoked sooner. Performed at [facility], [place] [address], [place], [place] [address]          Radiology Studies: Ct Head Wo Contrast  Result Date: 03/15/2019 CLINICAL DATA:  68 year old male with acute dizziness and ataxia. EXAM: CT HEAD WITHOUT CONTRAST TECHNIQUE: Contiguous axial images were obtained from the base of the skull through the vertex without intravenous contrast. COMPARISON:  None. FINDINGS: Brain: An infarct within the SUPERIOR RIGHT cerebellum appears acute or subacute. There is no evidence of hemorrhage. Mild atrophy and chronic small-vessel white matter ischemic changes are noted. No mass lesion, midline shift, hydrocephalus or extra-axial collection noted. Vascular: Carotid and vertebral atherosclerotic calcifications noted. Skull: Normal. Negative for fracture or focal lesion. Sinuses/Orbits: No acute finding. Other: None. IMPRESSION: 1. Acute to subacute RIGHT cerebellar infarct. No evidence of hemorrhage. 2. Atrophy and chronic small-vessel white matter ischemic changes.  Electronically Signed   By: Margarette Canada M.D.   On: 03/15/2019 12:49   Mr Jodene Nam Head Wo Contrast  Result Date: 03/15/2019 CLINICAL DATA:  Dizziness.  Abnormal cord in a shin. EXAM: MRI HEAD WITHOUT CONTRAST MRA HEAD WITHOUT CONTRAST TECHNIQUE: Multiplanar, multiecho pulse sequences of the brain and surrounding structures were obtained without intravenous contrast. Angiographic images of the head were obtained using MRA technique without contrast.  COMPARISON:  Head CT same day FINDINGS: MRI HEAD FINDINGS Brain: Diffusion imaging confirms the presence of a 3 cm region of acute infarction in the right superior and lateral cerebellum. Mild swelling but no hemorrhage or mass effect. No other acute infarction. Elsewhere, there chronic small-vessel ischemic changes of the pons. There are moderate chronic small-vessel ischemic changes of the cerebral hemispheric white matter. No large vessel territory infarction. No mass lesion, hemorrhage, hydrocephalus or extra-axial collection. Vascular: Major vessels at the base of the brain show flow. Skull and upper cervical spine: Negative Sinuses/Orbits: Clear/normal Other: None MRA HEAD FINDINGS Both internal carotid arteries are widely patent through the skull base and siphon regions. The anterior and middle cerebral vessels are patent without proximal stenosis, aneurysm or vascular malformation. Both vertebral arteries are patent at the foramen magnum and to the basilar. No basilar stenosis. Posterior inferior cerebellar arteries show flow on each side. Right anterior inferior cerebellar artery is seen. Both superior cerebellar arteries show flow presently. Both posterior cerebral arteries show flow. IMPRESSION: 3 cm region of acute infarction in the right superolateral cerebellum. No hemorrhage or mass effect. Moderate chronic small-vessel ischemic changes of the pons and cerebral hemispheric white matter. Intracranial MR angiography negative for large or medium vessel  occlusion. Specifically, no proximal posterior circulation occlusion seen to explain the infarction. Electronically Signed   By: Nelson Chimes M.D.   On: 03/15/2019 14:56   Mr Brain Wo Contrast  Result Date: 03/15/2019 CLINICAL DATA:  Dizziness.  Abnormal cord in a shin. EXAM: MRI HEAD WITHOUT CONTRAST MRA HEAD WITHOUT CONTRAST TECHNIQUE: Multiplanar, multiecho pulse sequences of the brain and surrounding structures were obtained without intravenous contrast. Angiographic images of the head were obtained using MRA technique without contrast. COMPARISON:  Head CT same day FINDINGS: MRI HEAD FINDINGS Brain: Diffusion imaging confirms the presence of a 3 cm region of acute infarction in the right superior and lateral cerebellum. Mild swelling but no hemorrhage or mass effect. No other acute infarction. Elsewhere, there chronic small-vessel ischemic changes of the pons. There are moderate chronic small-vessel ischemic changes of the cerebral hemispheric white matter. No large vessel territory infarction. No mass lesion, hemorrhage, hydrocephalus or extra-axial collection. Vascular: Major vessels at the base of the brain show flow. Skull and upper cervical spine: Negative Sinuses/Orbits: Clear/normal Other: None MRA HEAD FINDINGS Both internal carotid arteries are widely patent through the skull base and siphon regions. The anterior and middle cerebral vessels are patent without proximal stenosis, aneurysm or vascular malformation. Both vertebral arteries are patent at the foramen magnum and to the basilar. No basilar stenosis. Posterior inferior cerebellar arteries show flow on each side. Right anterior inferior cerebellar artery is seen. Both superior cerebellar arteries show flow presently. Both posterior cerebral arteries show flow. IMPRESSION: 3 cm region of acute infarction in the right superolateral cerebellum. No hemorrhage or mass effect. Moderate chronic small-vessel ischemic changes of the pons and cerebral  hemispheric white matter. Intracranial MR angiography negative for large or medium vessel occlusion. Specifically, no proximal posterior circulation occlusion seen to explain the infarction. Electronically Signed   By: Nelson Chimes M.D.   On: 03/15/2019 14:56   Dg Chest Port 1 View  Result Date: 03/15/2019 CLINICAL DATA:  Cough.  History of lung cancer. EXAM: PORTABLE CHEST 1 VIEW COMPARISON:  10/23/2018 and CT 06/04/2018 FINDINGS: Lungs are hypoinflated with worsening masslike opacification over the right suprahilar/paramediastinal region with mild mixed interstitial airspace density over the more peripheral right upper lobe/apex. No evidence  of effusion. Cardiomediastinal silhouette and remainder of the exam is unchanged. IMPRESSION: Mild interval worsening masslike opacity over the right suprahilar/paramediastinal region likely patient's known right lung cancer. Minimal mixed interstitial airspace density more peripherally over the right upper lobe/apex. Electronically Signed   By: Marin Olp M.D.   On: 03/15/2019 11:22        Scheduled Meds:  allopurinol  100 mg Oral QHS   amLODipine  10 mg Oral Daily   apixaban  5 mg Oral BID   aspirin EC  81 mg Oral Daily   benazepril  40 mg Oral Q2200   furosemide  40 mg Oral Daily   ipratropium-albuterol  3 mL Nebulization TID   levofloxacin  250 mg Oral QHS   metoprolol tartrate  100 mg Oral BID   mirtazapine  15 mg Oral QHS   Continuous Infusions:   LOS: 1 day    Time spent: 25 minutes spent in the coordination of care today.    Jonnie Finner, DO [facility] Pager [phone]  If 7PM-7AM, please contact night-coverage [email or web address] Password [email or web address] 03/16/2019, 8:05 AM

## 2019-03-16 NOTE — Progress Notes (Signed)
STROKE TEAM PROGRESS NOTE   INTERVAL HISTORY Patient lying in bed, still has right side ataxia.  On nasal cannula which was changed to [facility] by respirate therapist.  He stated that he takes aspirin 81 daily and Eliquis 5 mg twice daily at home, compliant with medication.  He also stated that he started have stroke symptoms right after he had severe coughing.  Denies heart palpitation or racing heart, neck pain or neck trauma.  [facility], had 3 biopsies which were all negative for cancer.  However, he had radiation for his lung nodules anyway.    Vitals:   03/16/19 0911 03/16/19 1207 03/16/19 1338 03/16/19 1612  BP: (!) 143/62 126/62  125/62  Pulse: 79 72  76  Resp: 16 16  18   Temp: (!) 97.5 F (36.4 C) 97.8 F (36.6 C)  98.2 F (36.8 C)  TempSrc: Oral Oral  Oral  SpO2: 94%  98%   Weight:      Height:        CBC:  Recent Labs  Lab 03/15/19 1036 03/16/19 0617  WBC 9.6 10.2  NEUTROABS 8.6*  --   HGB 14.1 12.4*  HCT 45.6 40.4  MCV 86.5 86.7  PLT 277 789    Basic Metabolic Panel:  Recent Labs  Lab 03/15/19 1036 03/16/19 0617  NA 139 144  K 4.9 4.9  CL 106 111  CO2 20* 24  GLUCOSE 318* 164*  BUN 53* 42*  CREATININE 2.05* 1.60*  CALCIUM 9.0 8.6*   Lipid Panel:     Component Value Date/Time   CHOL 90 03/16/2019 0617   TRIG 95 03/16/2019 0617   HDL 25 (L) 03/16/2019 0617   CHOLHDL 3.6 03/16/2019 0617   VLDL 19 03/16/2019 0617   LDLCALC 46 03/16/2019 0617   HgbA1c:  Lab Results  Component Value Date   HGBA1C 8.1 (H) 03/16/2019   Urine Drug Screen: No results found for: LABOPIA, COCAINSCRNUR, LABBENZ, AMPHETMU, THCU, LABBARB  Alcohol Level No results found for: ETH  IMAGING Ct Head Wo Contrast  Result Date: 03/15/2019 CLINICAL DATA:  68 year old male with acute dizziness and ataxia. EXAM: CT HEAD WITHOUT CONTRAST TECHNIQUE: Contiguous axial images were obtained from the base of the skull through the vertex without  intravenous contrast. COMPARISON:  None. FINDINGS: Brain: An infarct within the SUPERIOR RIGHT cerebellum appears acute or subacute. There is no evidence of hemorrhage. Mild atrophy and chronic small-vessel white matter ischemic changes are noted. No mass lesion, midline shift, hydrocephalus or extra-axial collection noted. Vascular: Carotid and vertebral atherosclerotic calcifications noted. Skull: Normal. Negative for fracture or focal lesion. Sinuses/Orbits: No acute finding. Other: None. IMPRESSION: 1. Acute to subacute RIGHT cerebellar infarct. No evidence of hemorrhage. 2. Atrophy and chronic small-vessel white matter ischemic changes. Electronically Signed   By: Margarette Canada M.D.   On: 03/15/2019 12:49   Mr Jodene Nam Head Wo Contrast  Result Date: 03/15/2019 CLINICAL DATA:  Dizziness.  Abnormal cord in a shin. EXAM: MRI HEAD WITHOUT CONTRAST MRA HEAD WITHOUT CONTRAST TECHNIQUE: Multiplanar, multiecho pulse sequences of the brain and surrounding structures were obtained without intravenous contrast. Angiographic images of the head were obtained using MRA technique without contrast. COMPARISON:  Head CT same day FINDINGS: MRI HEAD FINDINGS Brain: Diffusion imaging confirms the presence of a 3 cm region of acute infarction in the right superior and lateral cerebellum. Mild swelling but no hemorrhage or mass effect. No other acute infarction. Elsewhere, there chronic  small-vessel ischemic changes of the pons. There are moderate chronic small-vessel ischemic changes of the cerebral hemispheric white matter. No large vessel territory infarction. No mass lesion, hemorrhage, hydrocephalus or extra-axial collection. Vascular: Major vessels at the base of the brain show flow. Skull and upper cervical spine: Negative Sinuses/Orbits: Clear/normal Other: None MRA HEAD FINDINGS Both internal carotid arteries are widely patent through the skull base and siphon regions. The anterior and middle cerebral vessels are patent without  proximal stenosis, aneurysm or vascular malformation. Both vertebral arteries are patent at the foramen magnum and to the basilar. No basilar stenosis. Posterior inferior cerebellar arteries show flow on each side. Right anterior inferior cerebellar artery is seen. Both superior cerebellar arteries show flow presently. Both posterior cerebral arteries show flow. IMPRESSION: 3 cm region of acute infarction in the right superolateral cerebellum. No hemorrhage or mass effect. Moderate chronic small-vessel ischemic changes of the pons and cerebral hemispheric white matter. Intracranial MR angiography negative for large or medium vessel occlusion. Specifically, no proximal posterior circulation occlusion seen to explain the infarction. Electronically Signed   By: Nelson Chimes M.D.   On: 03/15/2019 14:56   Mr Brain Wo Contrast  Result Date: 03/15/2019 CLINICAL DATA:  Dizziness.  Abnormal cord in a shin. EXAM: MRI HEAD WITHOUT CONTRAST MRA HEAD WITHOUT CONTRAST TECHNIQUE: Multiplanar, multiecho pulse sequences of the brain and surrounding structures were obtained without intravenous contrast. Angiographic images of the head were obtained using MRA technique without contrast. COMPARISON:  Head CT same day FINDINGS: MRI HEAD FINDINGS Brain: Diffusion imaging confirms the presence of a 3 cm region of acute infarction in the right superior and lateral cerebellum. Mild swelling but no hemorrhage or mass effect. No other acute infarction. Elsewhere, there chronic small-vessel ischemic changes of the pons. There are moderate chronic small-vessel ischemic changes of the cerebral hemispheric white matter. No large vessel territory infarction. No mass lesion, hemorrhage, hydrocephalus or extra-axial collection. Vascular: Major vessels at the base of the brain show flow. Skull and upper cervical spine: Negative Sinuses/Orbits: Clear/normal Other: None MRA HEAD FINDINGS Both internal carotid arteries are widely patent through the  skull base and siphon regions. The anterior and middle cerebral vessels are patent without proximal stenosis, aneurysm or vascular malformation. Both vertebral arteries are patent at the foramen magnum and to the basilar. No basilar stenosis. Posterior inferior cerebellar arteries show flow on each side. Right anterior inferior cerebellar artery is seen. Both superior cerebellar arteries show flow presently. Both posterior cerebral arteries show flow. IMPRESSION: 3 cm region of acute infarction in the right superolateral cerebellum. No hemorrhage or mass effect. Moderate chronic small-vessel ischemic changes of the pons and cerebral hemispheric white matter. Intracranial MR angiography negative for large or medium vessel occlusion. Specifically, no proximal posterior circulation occlusion seen to explain the infarction. Electronically Signed   By: Nelson Chimes M.D.   On: 03/15/2019 14:56   Dg Chest Port 1 View  Result Date: 03/15/2019 CLINICAL DATA:  Cough.  History of lung cancer. EXAM: PORTABLE CHEST 1 VIEW COMPARISON:  10/23/2018 and CT 06/04/2018 FINDINGS: Lungs are hypoinflated with worsening masslike opacification over the right suprahilar/paramediastinal region with mild mixed interstitial airspace density over the more peripheral right upper lobe/apex. No evidence of effusion. Cardiomediastinal silhouette and remainder of the exam is unchanged. IMPRESSION: Mild interval worsening masslike opacity over the right suprahilar/paramediastinal region likely patient's known right lung cancer. Minimal mixed interstitial airspace density more peripherally over the right upper lobe/apex. Electronically Signed   By: Quillian Quince  Derrel Nip M.D.   On: 03/15/2019 11:22   2D Echocardiogram  1. The left ventricle has normal systolic function, with an ejection fraction of 60-65%. The cavity size was normal. There is moderately increased left ventricular wall thickness. Left ventricular diastolic Doppler parameters are  consistent with  impaired relaxation.  2. The right ventricle has normal systolc function. The cavity was normal.  3. The aortic valve is tricuspid Mild calcification of the aortic valve. No stenosis of the aortic valve.  4. The inferior vena cava was dilated in size with >50% respiratory variability.  5. Technically difficult; definity used; normal LV systolic function; moderate LVH; probable mild diastolic dysfunction; calcified aortic valve with reduced excursion of left coronary cusp but no AS by doppler.   PHYSICAL EXAM  Temp:  [97.4 F (36.3 C)-98.2 F (36.8 C)] 98.2 F (36.8 C) (05/05 1612) Pulse Rate:  [71-83] 76 (05/05 1612) Resp:  [16-20] 18 (05/05 1612) BP: (116-162)/(51-97) 125/62 (05/05 1612) SpO2:  [90 %-100 %] 98 % (05/05 1338)  General - Well nourished, well developed, in mild respiratory distress, changing from nasal cannula to nonrebreather.  Ophthalmologic - fundi not visualized due to noncooperation.  Cardiovascular - Regular rate and rhythm.  Mental Status -  Level of arousal and orientation to time, place, and person were intact. Language including expression, naming, repetition, comprehension was assessed and found intact.  Moderate dysarthria  Cranial Nerves II - XII - II - Visual field intact OU. III, IV, VI - Extraocular movements intact. V - Facial sensation intact bilaterally. VII - Facial movement intact bilaterally. VIII - Hearing & vestibular intact bilaterally. X - Palate elevates symmetrically. XI - Chin turning & shoulder shrug intact bilaterally. XII - Tongue protrusion intact.  Motor Strength - The patient's strength was normal in all extremities and pronator drift was absent.  Bulk was normal and fasciculations were absent.   Motor Tone - Muscle tone was assessed at the neck and appendages and was normal.  Reflexes - The patient's reflexes were symmetrical in all extremities and he had no pathological reflexes.  Sensory - Light touch,  temperature/pinprick were assessed and were symmetrical.    Coordination - The patient had normal movements in the left hand and foot with no ataxia or dysmetria.  However, left finger-to-nose and heel-to-shin ataxia.  Tremor was absent.  Gait and Station - deferred.   ASSESSMENT/PLAN Mr. ALVA BROXSON is a 68 y.o. male with history of PE 2 mos ago on Eliquis, B squamous cell cancer of the lungs, CAD s/p PCA, COPD, DB, HTN, HLD, CKD stage 2, PVD and tobacco abuse presenting with dizziness, nausea and vomiting, trouble saying words.   Stroke:   R SCA territory cerebellar infarct even on aspirin and Eliquis, embolic secondary to unknown source.  DDX including 1.  Hypercoagulable state due to advanced malignancy - will need to check CT C/T/P with contrast as well as MRI brain with contrast 2.  Vertebral artery dissection - patient had stroke symptoms after severe coughing, need to check CTA neck 3.  Paradoxical emboli - patient had symptoms after severe coughing, need to rule out DVT  CT head R cerebellar infarct. Small vessel disease. Atrophy.   MRI  R superolateral cerebellar infarct. Mod Small vessel disease.   MRA  Unremarkable   Consider CTA neck once cre normalizes  Carotid Doppler  B ICA 1-39% stenosis, VAs antegrade (March 2020)  2D Echo EF 60-65%. No source of embolus   LE venous Doppler pending  LDL 96  HgbA1c 8.1  HIV neg  Lovenox 30 mg sq daily for VTE prophylaxis  On diet  aspirin 81 mg daily and Eliquis (apixaban) daily prior to admission, now on aspirin 81 mg daily and Eliquis (apixaban) daily. Continue at d.c  Therapy recommendations:  CIR  Disposition:  pending   History of PE  As per chart, he was dyspneic in December 2019 and hypoxic.   V/Q scan at that time with possible left PE.   He was started on Eliquis.  ?? Squamous cell lung cancer in 2019  Follow with [place]  Patient stated that he had a 3 biopsy in 3 different locations, but all were  negative for cancer  Anyway, he had radiation therapy for his lung nodules  Given current stroke, recommend CT C/T/P with contrast once Cre normalizes  Also recommend MRI brain with contrast to rule out brain metastasis  AKI on CKD stage 2  Creatinine 2.27-2.05-1.60  Encourage p.o. intake  Gentle hydration  BMP monitoring  Once creatinine normalizes, will consider CT and MRI with contrast  Mild respiratory distress  History of smoking and COPD as always lung nodule/cancer  Nasal cannula change to nonrebreather  On Levaquin  Treatment per primary team  Hypertension  Stable  On home metoprolol, Norvasc, Lasix and Lotensin . Permissive hypertension (OK if <180/105) but gradually normalize in 2-3 days . Long-term BP goal normotensive  Diabetes type II Uncontrolled  HgbA1c 8.1, goal < 7.0  SSI  CBG monitoring  Close PCP follow-up  Tobacco abuse  Current smoker  Smoking cessation counseling provided  Pt is willing to quit  Other Stroke Risk Factors  Advanced age  Hx smokeless tobacco use  Coronary artery disease MI s/p stenting and PCI  PVD  Other Active Problems    Hospital day # 1  I spent  35 minutes in total face-to-face time with the patient, more than 50% of which was spent in counseling and coordination of care, reviewing test results, images and medication, and discussing the diagnosis of cerebellar stroke, history of PE on anticoagulation, respiratory distress, questionable lung cancer, treatment plan and potential prognosis. This patient's care requiresreview of multiple databases, neurological assessment, discussion with family, other specialists and medical decision making of high complexity. I have discussed with Dr. Marylyn Ishihara.    Rosalin Hawking, MD PhD Stroke Neurology 03/16/2019 5:28 PM    To contact Stroke Continuity provider, please refer to [email or web address] After hours, contact General Neurology

## 2019-03-16 NOTE — Progress Notes (Signed)
Inpatient Diabetes Program Recommendations  AACE/ADA: New Consensus Statement on Inpatient Glycemic Control  Target Ranges:  Prepandial:   less than 140 mg/dL      Peak postprandial:   less than 180 mg/dL (1-2 hours)      Critically ill patients:  140 - 180 mg/dL  [facility] [id number] as of 03/16/2019 12:12  Ref. Range 03/15/2019 10:36 03/16/2019 06:17  Glucose Latest Ref Range: 70 - 99 mg/dL 318 (H) 164 (H)   Results for [facility], [facility]" (MRN [id number]) as of 03/16/2019 12:12  Ref. Range 03/16/2019 06:17  Hemoglobin A1C Latest Ref Range: 4.8 - 5.6 % 8.1 (H)   Review of Glycemic Control  Diabetes history: DM2 Outpatient Diabetes medications: Toujeo 27 units QAM, Humalog 11 units BID Current orders for Inpatient glycemic control: None  Inpatient Diabetes Program Recommendations:  Insulin-Correction: Please consider ordering CBGs with Novolog 0-15 units TID with meals and Novolog 0-5 units QHS.  Thanks, Barnie Alderman, RN, MSN, CDE Diabetes Coordinator Inpatient Diabetes Program [phone] (Team Pager from 8am to 5pm)

## 2019-03-16 NOTE — Evaluation (Signed)
Occupational Therapy Evaluation Patient Details Name: Travis [name] MRN: [id number] DOB: 1950-12-29 Today's Date: 03/16/2019    History of Present Illness Travis [name] is an 68 y.o. male who presents to the ED today with symptoms of dizziness with N/V since early this AM at 0100. He awoke from sleep with a cough and then started to vomit, with the vomiting persisting throughout the night. He was not completely normal prior to this, however, having noticed that his speech had changed yesterday night at about 2200, with "trouble saying words". Of note, O2 saturations in the ED were 89-92%, so he was started on 2L [place]. 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 CT head revealed a subacute right cerebellar hemisphere ischemic infarction   Clinical Impression   Pt seen for OT evaluation today as cotx with PT.  Pt presents with decreased balance, ataxia (UE and LE, R greater than L), visual vestibular and visual perceptual deficits, decreased functional mobility, decreased RUE functional use. Pt will benefit from CIR prior to d/c home as wife can assist only at min a level.     Follow Up Recommendations  CIR;Supervision/Assistance - 24 hour    Equipment Recommendations  None recommended by OT(defer to next veunue)    Recommendations for Other Services Rehab consult     Precautions / Restrictions Precautions Precautions: Fall Precaution Comments: Pt ataxic and unable to tell which direction he loses his balance.  Restrictions Weight Bearing Restrictions: No      Mobility Bed Mobility Overal bed mobility: Needs Assistance Bed Mobility: Supine to Sit;Sit to Supine     Supine to sit: Supervision Sit to supine: Supervision;HOB elevated   General bed mobility comments: HOB elevated as pt reports he becomes dizzy (and has had N/V in past 24 hours) when moving supine to sit.  Pt needs close supervision.     Transfers Overall transfer level: Needs assistance Equipment used: 2 person hand held assist Transfers: Sit to/from Stand Sit to Stand: Mod assist         General transfer comment: mod a x2 for functional ambulation, 2 person hand held assist. BLE ataxia, R greater than L, poor perception of midline, poor abilty to determine direction of LOB however is aware he is falling. Some evidence of attempted step strategy with LOB however ineffective    Balance Overall balance assessment: Needs assistance Sitting-balance support: Feet unsupported;No upper extremity supported Sitting balance-Leahy Scale: Fair     Standing balance support: Bilateral upper extremity supported Standing balance-Leahy Scale: Poor Standing balance comment: pt with mod a x2 for sit to stand, min a  x2 for standing, then mod a x2 for functional ambulation                           ADL either performed or assessed with clinical judgement   ADL Overall ADL's : Needs assistance/impaired Eating/Feeding: Minimal assistance Eating/Feeding Details (indicate cue type and reason): to cut, open containers and with items such as soup Grooming: Wash/dry hands;Wash/dry face;Oral care;Applying deodorant;Set up   Upper Body Bathing: Minimal assistance;Sitting   Lower Body Bathing: Maximal assistance;Sit to/from stand Lower Body Bathing Details (indicate cue type and reason): pt requires signficant assistance for dynamic standing balance Upper Body Dressing : Minimal assistance   Lower Body Dressing: Maximal assistance   Toilet Transfer: Moderate assistance;+2 for physical assistance Toilet Transfer Details (  indicate cue type and reason): mod a x2 due to signficant ataxia RLE greater than LLE.  Pt with several LOB during basic mobility.  Hand held assist x2 Toileting- Clothing Manipulation and Hygiene: Moderate assistance       Functional mobility during ADLs: +2 for physical assistance;Moderate  assistance General ADL Comments: mod a x2 hand held (pt would not be able to control walker at this time)     Vision Baseline Vision/History: No visual deficits Patient Visual Report: Other (comment)(I misjudge where stuff is) Vision Assessment?: Vision impaired- to be further tested in functional context Additional Comments: pt with depth perception difficulty as well as eye hand coordination. Will need further assessment for visual vestibular impairment. Smotth pursuits appear WFLs as do fields.       Perception     Praxis      Pertinent Vitals/Pain Pain Assessment: No/denies pain     Hand Dominance Right   Extremity/Trunk Assessment Upper Extremity Assessment Upper Extremity Assessment: RUE deficits/detail;LUE deficits/detail RUE Deficits / Details: RUE with ataxic movement, greater than LUE. AROM and strength WFL's RUE Sensation: WNL RUE Coordination: decreased fine motor LUE Deficits / Details: mild ataxia with tremor, AROM and strength WFL's LUE Sensation: WNL LUE Coordination: decreased fine motor   Lower Extremity Assessment Lower Extremity Assessment: Defer to PT evaluation       Communication Communication Communication: Expressive difficulties   Cognition Arousal/Alertness: Awake/alert Behavior During Therapy: WFL for tasks assessed/performed Overall Cognitive Status: Within Functional Limits for tasks assessed                                 General Comments: Pt demonstrates awareness and safety stating I can't get up by myself I need at least one if not two people to help   General Comments       Exercises     Shoulder Instructions      Home Living Family/patient expects to be discharged to:: Private residence Living Arrangements: Spouse/significant other Available Help at Discharge: Available 24 hours/day Type of Home: House Home Access: Stairs to enter [employer] of Steps: 3 in the front, 1 in the back.  railing on  both sides in back Entrance Stairs-Rails: Right;Left;Can reach both Home Layout: One level     Bathroom Shower/Tub: Occupational psychologist: Standard Bathroom Accessibility: Yes   Home Equipment: None      Lives With: Spouse    Prior Functioning/Environment Level of Independence: Independent                 OT Problem List: Decreased activity tolerance;Impaired vision/perception;Impaired UE functional use;Impaired balance (sitting and/or standing);Decreased coordination      OT Treatment/Interventions: Self-care/ADL training;Therapeutic exercise;Neuromuscular education;DME and/or AE instruction;Therapeutic activities;Visual/perceptual remediation/compensation;Patient/family education;Balance training    OT Goals(Current goals can be found in the care plan section) Acute Rehab OT Goals Patient Stated Goal: I want to be able to walk again and go home when I can OT Goal Formulation: With patient Time For Goal Achievement: 03/30/19 Potential to Achieve Goals: Good  OT Frequency: Min 3X/week   Barriers to D/C:    pt states wife can assist however she is "only 5 feet tall"  Pt reports wife has no sigificant health issues.        Co-evaluation PT/OT/SLP Co-Evaluation/Treatment: Yes Reason for Co-Treatment: Complexity of the patient's impairments (multi-system involvement);For patient/therapist safety;To address functional/ADL transfers  AM-PAC OT "6 Clicks" Daily Activity     Outcome Measure Help from another person eating meals?: A Little Help from another person taking care of personal grooming?: A Little Help from another person toileting, which includes using toliet, bedpan, or urinal?: Total Help from another person bathing (including washing, rinsing, drying)?: Total Help from another person to put on and taking off regular upper body clothing?: Total Help from another person to put on and taking off regular lower body clothing?: Total 6 Click  Score: 10   End of Session Equipment Utilized During Treatment: Gait belt Nurse Communication: Mobility status  Activity Tolerance: Patient tolerated treatment well Patient left: in bed;with call bell/phone within reach;with bed alarm set  OT Visit Diagnosis: Unsteadiness on feet (R26.81);Ataxia, unspecified (R27.0);Other symptoms and signs involving the nervous system (R29.898);Other (comment)(visual and visual perceptual deficits)                Time: 1047-1110 OT Time Calculation (min): 23 min Charges:  OT General Charges $OT Visit: 1 Visit(cotx with PT) OT Evaluation $OT Eval Moderate Complexity: 1 Mod     Judieth Keens, Estrella Myrtle, OTR/L 03/16/2019, 12:02 PM

## 2019-03-16 NOTE — Progress Notes (Signed)
  Echocardiogram 2D Echocardiogram with Definity has been performed.  Travis [name] 03/16/2019, 9:00 AM

## 2019-03-17 ENCOUNTER — Inpatient Hospital Stay (HOSPITAL_COMMUNITY): Payer: Medicare Other

## 2019-03-17 DIAGNOSIS — I739 Peripheral vascular disease, unspecified: Secondary | ICD-10-CM

## 2019-03-17 DIAGNOSIS — I639 Cerebral infarction, unspecified: Secondary | ICD-10-CM

## 2019-03-17 LAB — CBC WITH DIFFERENTIAL/PLATELET
Abs Immature Granulocytes: 0.04 10*3/uL (ref 0.00–0.07)
Basophils Absolute: 0.1 10*3/uL (ref 0.0–0.1)
Basophils Relative: 1 %
Eosinophils Absolute: 0.2 10*3/uL (ref 0.0–0.5)
Eosinophils Relative: 3 %
HCT: 42.3 % (ref 39.0–52.0)
Hemoglobin: 13.2 g/dL (ref 13.0–17.0)
Immature Granulocytes: 1 %
Lymphocytes Relative: 8 %
Lymphs Abs: 0.7 10*3/uL (ref 0.7–4.0)
MCH: 26.8 pg (ref 26.0–34.0)
MCHC: 31.2 g/dL (ref 30.0–36.0)
MCV: 86 fL (ref 80.0–100.0)
Monocytes Absolute: 0.7 10*3/uL (ref 0.1–1.0)
Monocytes Relative: 8 %
Neutro Abs: 7.1 10*3/uL (ref 1.7–7.7)
Neutrophils Relative %: 79 %
Platelets: 235 10*3/uL (ref 150–400)
RBC: 4.92 MIL/uL (ref 4.22–5.81)
RDW: 14 % (ref 11.5–15.5)
WBC: 8.8 10*3/uL (ref 4.0–10.5)
nRBC: 0 % (ref 0.0–0.2)

## 2019-03-17 LAB — GLUCOSE, CAPILLARY: Glucose-Capillary: 158 mg/dL — ABNORMAL HIGH (ref 70–99)

## 2019-03-17 LAB — RENAL FUNCTION PANEL
Albumin: 2.3 g/dL — ABNORMAL LOW (ref 3.5–5.0)
Anion gap: 8 (ref 5–15)
BUN: 33 mg/dL — ABNORMAL HIGH (ref 8–23)
CO2: 26 mmol/L (ref 22–32)
Calcium: 8.7 mg/dL — ABNORMAL LOW (ref 8.9–10.3)
Chloride: 108 mmol/L (ref 98–111)
Creatinine, Ser: 1.39 mg/dL — ABNORMAL HIGH (ref 0.61–1.24)
GFR calc Af Amer: 60 mL/min — ABNORMAL LOW (ref >60)
GFR calc non Af Amer: 51 mL/min — ABNORMAL LOW (ref >60)
Glucose, Bld: 180 mg/dL — ABNORMAL HIGH (ref 70–99)
Phosphorus: 3 mg/dL (ref 2.5–4.6)
Potassium: 4.7 mmol/L (ref 3.5–5.1)
Sodium: 142 mmol/L (ref 135–145)

## 2019-03-17 LAB — MAGNESIUM: Magnesium: 2 mg/dL (ref 1.7–2.4)

## 2019-03-17 MED ORDER — GUAIFENESIN ER 600 MG PO TB12
600.0000 mg | ORAL_TABLET | Freq: Two times a day (BID) | ORAL | Status: DC
  Administered 2019-03-17 – 2019-03-19 (×4): 600 mg via ORAL
  Filled 2019-03-17 (×4): qty 1

## 2019-03-17 MED ORDER — IOHEXOL 300 MG/ML  SOLN
100.0000 mL | Freq: Once | INTRAMUSCULAR | Status: AC | PRN
  Administered 2019-03-17: 80 mL via INTRAVENOUS

## 2019-03-17 NOTE — Progress Notes (Signed)
[name]  PROGRESS NOTE    Travis [name]  EAV:[id number] DOB: 07-16-1951 DOA: 03/15/2019 PCP: [name], MD   Brief Narrative:   Travis [name] is a 68 y.o. male with medical history significant of DM2, HLD, COPD, CAD; presents with nausea, vomiting, and dizziness. He notes that at about 0100hrs on 03/15/2019 he felt acutely nauseous. As he tried to get up, he became dizzy and began to vomit. He had several episodes of emesis that were accompanied by uncontrolled coughing. The vomitus was dark, but no blood noted. He felt off balance and his right arm became weak. He also notes that he had problems with expressing his words that started around 2200hrs 03/14/2019. He denies a syncopal episode and also notes that no one in the house was awake to witness this event. He became concerned about his symptoms can came to [facility] ED. Pt evaled by ED staff. Noted to have acute CVA on imaging.    Assessment & Plan:   Principal Problem:   CVA (cerebral vascular accident) ([place]) Active Problems:   HYPERCHOLESTEROLEMIA   Essential hypertension, benign   CORONARY ATHEROSCLEROSIS NATIVE CORONARY ARTERY   PVD   CKD (chronic kidney disease) stage 3, GFR 30-59 ml/min (HCC)   Nausea & vomiting   CVA     - neurology onboard     - CTH:  Acute to subacute RIGHT cerebellar infarct. No evidence of hemorrhage.     - MRI/MRA brain: IMPRESSION: 3 cm region of acute infarction in the right superolateral cerebellum. No hemorrhage or mass effect. Moderate chronic small-vessel ischemic changes of the pons and cerebral hemispheric white matter. Intracranial MR angiography negative for large or medium vessel occlusion. Specifically, no proximal posterior circulation occlusion seen to explain the infarction.     - Echo: 1. The left ventricle has normal systolic function, with an ejection fraction of 60-65%. The cavity size was normal. There is moderately increased left ventricular wall thickness. Left ventricular diastolic Doppler parameters  are consistent with  impaired relaxation. 2. The right ventricle has normal systolc function. The cavity was normal. 3. The aortic valve is tricuspid Mild calcification of the aortic valve. No stenosis of the aortic valve. 4. The inferior vena cava was dilated in size with >50% respiratory variability. 5. Technically difficult; definity used; normal LV systolic function; moderate LVH; probable mild diastolic dysfunction; calcified aortic valve with reduced excursion of left coronary cusp but no AS by doppler.     - Carotid dopplers pending     - A1c 8.1     - Tchol: 90, HDL: 25, LDL: 46      - Consult PT/OT/SLP; NPO until swallow screen completed      - ASA 81mg   HTN     - amlodipine 10mg  qday, benazepril 40mg  qday, lasix  HLD     - lipitor 40mg   CAD     - ASA, statin, BB     - eliquis  PVD     - see above  CKD stage 3     - watch nephrotoxins; monitor     - Scr 1.6 this AM  N/V     - related to CVA     - antiemetics PRN, fluids     - resolved  DM2     - takes toujeo at home and humalog     - SSI     - A1c 8.1  Complete occlusion of the right upper lobe bronchus Incentive spirometry flutter.  acute deep vein thrombosis involving the left posterior tibial vein On apixaban  May need to transition to lovenox but renal function will complicate.  Will discuss further option   DVT prophylaxis: SCDs, eliquis Code Status: FULL Family Communication: Update to wife at 33 hrs. Disposition Plan: To CIR?   Consultants:   Neurology  Procedures:   None  Antimicrobials:   None    Subjective: No acute events, no chest pain has generalized pain  Objective: Vitals:   03/17/19 1127 03/17/19 1454 03/17/19 1500 03/17/19 1947  BP: (!) 141/66  (!) 143/63 (!) 130/58  Pulse: 72  77 79  Resp:    14  Temp: 97.9 F (36.6 C)  97.9 F (36.6 C) 100.1 F (37.8 C)  TempSrc: Oral  Oral Oral  SpO2: 96% 90% 95% 90%  Weight:      Height:        Intake/Output  Summary (Last 24 hours) at 03/17/2019 2034 Last data filed at 03/17/2019 1700 Gross per 24 hour  Intake --  Output 1000 ml  Net -1000 ml   Filed Weights   03/15/19 0955  Weight: 95.3 kg    Examination:  General: 68 y.o. male resting in bed in NAD Cardiovascular: RRR, +S1, S2, no m/g/r, equal pulses throughout Respiratory: CTABL, no w/r/r, normal WOB GI: BS+, NDNT, no masses noted, no organomegaly noted MSK: No e/c/c Skin: No rashes, bruises, ulcerations noted Neuro: A&O x 3, no focal deficits Psyc: Appropriate interaction and affect, calm/cooperative    Data Reviewed: I have personally reviewed following labs and imaging studies.  CBC: Recent Labs  Lab 03/15/19 1036 03/16/19 0617 03/17/19 0426  WBC 9.6 10.2 8.8  NEUTROABS 8.6*  --  7.1  HGB 14.1 12.4* 13.2  HCT 45.6 40.4 42.3  MCV 86.5 86.7 86.0  PLT 277 261 017   Basic Metabolic Panel: Recent Labs  Lab 03/15/19 1036 03/16/19 0617 03/17/19 0426  NA 139 144 142  K 4.9 4.9 4.7  CL 106 111 108  CO2 20* 24 26  GLUCOSE 318* 164* 180*  BUN 53* 42* 33*  CREATININE 2.05* 1.60* 1.39*  CALCIUM 9.0 8.6* 8.7*  MG  --   --  2.0  PHOS  --   --  3.0   GFR: Estimated Creatinine Clearance: 60.1 mL/min (A) (by C-G formula based on SCr of 1.39 mg/dL (H)). Liver Function Tests: Recent Labs  Lab 03/15/19 1036 03/16/19 0617 03/17/19 0426  AST 25 17  --   ALT 24 17  --   ALKPHOS 57 48  --   BILITOT 0.5 0.5  --   PROT 7.1 5.6*  --   ALBUMIN 2.7* 2.2* 2.3*   Recent Labs  Lab 03/15/19 1036  LIPASE 30   No results for input(s): AMMONIA in the last 168 hours. Coagulation Profile: Recent Labs  Lab 03/15/19 1036  INR 1.5*   Cardiac Enzymes: Recent Labs  Lab 03/15/19 1036  TROPONINI <0.03   BNP (last 3 results) No results for input(s): PROBNP in the last 8760 hours. HbA1C: Recent Labs    03/16/19 0617  HGBA1C 8.1*   CBG: Recent Labs  Lab 03/16/19 1444 03/17/19 1619  GLUCAP 189* 158*   Lipid  Profile: Recent Labs    03/16/19 0617  CHOL 90  HDL 25*  LDLCALC 46  TRIG 95  CHOLHDL 3.6   Thyroid Function Tests: No results for input(s): TSH, T4TOTAL, FREET4, T3FREE, THYROIDAB in the last 72 hours. Anemia Panel: No results for  input(s): VITAMINB12, FOLATE, FERRITIN, TIBC, IRON, RETICCTPCT in the last 72 hours. Sepsis Labs: No results for input(s): PROCALCITON, LATICACIDVEN in the last 168 hours.  Recent Results (from the past 240 hour(s))  SARS Coronavirus 2 (CEPHEID- Performed in [place] hospital lab), Hosp Order     Status: None   Collection Time: 03/15/19 10:46 AM  Result Value Ref Range Status   SARS Coronavirus 2 NEGATIVE NEGATIVE Final    Comment: (NOTE) If result is NEGATIVE SARS-CoV-2 target nucleic acids are NOT DETECTED. The SARS-CoV-2 RNA is generally detectable in upper and lower  respiratory specimens during the acute phase of infection. The lowest  concentration of SARS-CoV-2 viral copies this assay can detect is 250  copies / mL. A negative result does not preclude SARS-CoV-2 infection  and should not be used as the sole basis for treatment or other  patient management decisions.  A negative result may occur with  improper specimen collection / handling, submission of specimen other  than nasopharyngeal swab, presence of viral mutation(s) within the  areas targeted by this assay, and inadequate number of viral copies  (<250 copies / mL). A negative result must be combined with clinical  observations, patient history, and epidemiological information. If result is POSITIVE SARS-CoV-2 target nucleic acids are DETECTED. The SARS-CoV-2 RNA is generally detectable in upper and lower  respiratory specimens dur ing the acute phase of infection.  Positive  results are indicative of active infection with SARS-CoV-2.  Clinical  correlation with patient history and other diagnostic information is  necessary to determine patient infection status.  Positive  results do  not rule out bacterial infection or co-infection with other viruses. If result is PRESUMPTIVE POSTIVE SARS-CoV-2 nucleic acids MAY BE PRESENT.   A presumptive positive result was obtained on the submitted specimen  and confirmed on repeat testing.  While 2019 novel coronavirus  (SARS-CoV-2) nucleic acids may be present in the submitted sample  additional confirmatory testing may be necessary for epidemiological  and / or clinical management purposes  to differentiate between  SARS-CoV-2 and other Sarbecovirus currently known to infect humans.  If clinically indicated additional testing with an alternate test  methodology [phone]) is advised. The SARS-CoV-2 RNA is generally  detectable in upper and lower respiratory sp ecimens during the acute  phase of infection. The expected result is Negative. Fact Sheet for Patients:  StrictlyIdeas.no Fact Sheet for Healthcare Providers: BankingDealers.co.za This test is not yet approved or cleared by the [place] FDA and has been authorized for detection and/or diagnosis of SARS-CoV-2 by FDA under an Emergency Use Authorization (EUA).  This EUA will remain in effect (meaning this test can be used) for the duration of the COVID-19 declaration under Section 564(b)(1) of the Act, 21 U.S.C. section 360bbb-3(b)(1), unless the authorization is terminated or revoked sooner. Performed at [facility], [place] [address] [place], [place] [address]          Radiology Studies: Ct Chest W Contrast  Result Date: 03/17/2019 CLINICAL DATA:  Squamous cell carcinoma the lungs. Right cerebellar stroke. EXAM: CT CHEST, ABDOMEN, AND PELVIS WITH CONTRAST TECHNIQUE: Multidetector CT imaging of the chest, abdomen and pelvis was performed following the standard protocol during bolus administration of intravenous contrast. CONTRAST:  28mL OMNIPAQUE IOHEXOL 300 MG/ML  SOLN COMPARISON:  Chest CT from  06/04/2018 and PET-CT from 08/15/2017 FINDINGS: CT CHEST FINDINGS Cardiovascular: Coronary, aortic arch, and branch vessel atherosclerotic vascular disease. Mild cardiomegaly. Interventricular septal thickening suggesting left ventricular hypertrophy.  Mediastinum/Nodes: Right paratracheal node 1.0 cm in short axis on image 19/3, formerly 0.6 cm. Additional small right paratracheal lymph nodes are below 1 cm in diameter but mildly increased from prior exam. Subcarinal node 1.1 cm in short axis on image 30/3, formerly 0.5 cm. Bilateral gynecomastia. Lungs/Pleura: Small right pleural effusion. There is complete occlusion of the right upper lobe bronchus. There is atelectasis and possibly some mild alveolar filling airspace opacity posteriorly in the right upper lobe, with aerated anterior portion of the right upper lobe. Airway thickening along the bronchus intermedius resulting in some luminal narrowing, with airway thickening centrally in the right middle lobe and right lower lobe. Superior segment right lower lobe airspace opacity on image 67/5 could be from prior radiation therapy though infection is not excluded. Fiducials are noted in the superior segment right lower lobe. Further inferiorly in the vicinity of the prior pulmonary nodule, there is indistinct patchy opacity which could be from radiation pneumonitis or pneumonia, for example as shown on image 93/5. The original nodule is not readily apparent against this background density. There is some associated surrounding ground-glass opacity. There is some faint nodularity peripherally in the left upper lobe. On image 62/5, a triangular 0.4 by 0.3 cm nodule is new or significantly increased from prior. Indistinct ground-glass density nodule in the left upper lobe measuring 0.9 cm in diameter on image 78/5 is new. Subsegmental atelectasis is present anteriorly in left lower lobe. Small fiducials are present anteriorly in the left lower lobe and there are  bandlike opacities along the left hemidiaphragm with crowding of bronchovascular structures suggesting some degree of volume loss. The lobulated nodule along the inferior pulmonary ligament is somewhat obscured by surrounding atelectasis, for example on image 114/5, but is less sharply defined and possibly less prominent. There is some additional nodularity in this vicinity, for example posteriorly on image 112/5 where there is a 1.7 by 1.3 cm ill-defined nodular density, probably inflammatory. Musculoskeletal: Thoracic spondylosis. CT ABDOMEN PELVIS FINDINGS Hepatobiliary: Cholecystectomy. The common bile duct measures 1.1 cm in diameter, not changed from 08/15/2017, probably a physiologic response to cholecystectomy. No focal liver lesion is identified. Pancreas: Unremarkable Spleen: Unremarkable Adrenals/Urinary Tract: 1.7 by 1.8 cm left adrenal mass is unchanged in size from 08/15/2017 and was not previously hypermetabolic, hence likely benign. Small hypodense lesions of the left mid kidney right kidney upper pole probably cysts but technically too small to characterize. Considerable vascular calcifications along the renal hila bilaterally. No definite urinary tract calculi. Stomach/Bowel: Unremarkable Vascular/Lymphatic: Aortoiliac atherosclerotic vascular disease. No pathologic adenopathy identified. Reproductive: Faint calcification in the right posterior prostate gland. Other: No supplemental non-categorized findings. Musculoskeletal: Incidental lipoma in the right vastus lateralis muscle. Degenerative disc disease at L4-5. IMPRESSION: 1. Both the right lower lobe pulmonary nodule on the left lower lobe pulmonary nodule are reduced in conspicuity with some degree of surrounding airspace opacity, possibly radiation pneumonitis if the patient has had interval radiation therapy. 2. Complete occlusion of the right upper lobe bronchus. Most of the right upper lobe is collapsed potentially with a small amount of  airspace filling process; there is air trapping in portions of the right upper lobe anteriorly. 3. Airway thickening centrally in the right lung, resulting in some luminal narrowing of the bronchus intermedius. 4. There is a new 4 mm triangular-shaped left upper lobe pulmonary nodule along with a new small ground-glass density nodule in the left upper lobe which merit surveillance. 5. Mildly enlarged paratracheal and subcarinal lymph nodes likewise merits  surveillance although could be reactive. 6. Other imaging findings of potential clinical significance: Aortic Atherosclerosis (ICD10-I70.0). Coronary atherosclerosis. Mild cardiomegaly with suggestion of left ventricular hypertrophy. Small right pleural effusion. Chronically stable small left adrenal mass, not previously hypermetabolic on PET-CT and hence likely benign. Electronically Signed   By: Van Clines M.D.   On: 03/17/2019 15:11   Ct Abdomen Pelvis W Contrast  Result Date: 03/17/2019 CLINICAL DATA:  Squamous cell carcinoma the lungs. Right cerebellar stroke. EXAM: CT CHEST, ABDOMEN, AND PELVIS WITH CONTRAST TECHNIQUE: Multidetector CT imaging of the chest, abdomen and pelvis was performed following the standard protocol during bolus administration of intravenous contrast. CONTRAST:  49mL OMNIPAQUE IOHEXOL 300 MG/ML  SOLN COMPARISON:  Chest CT from 06/04/2018 and PET-CT from 08/15/2017 FINDINGS: CT CHEST FINDINGS Cardiovascular: Coronary, aortic arch, and branch vessel atherosclerotic vascular disease. Mild cardiomegaly. Interventricular septal thickening suggesting left ventricular hypertrophy. Mediastinum/Nodes: Right paratracheal node 1.0 cm in short axis on image 19/3, formerly 0.6 cm. Additional small right paratracheal lymph nodes are below 1 cm in diameter but mildly increased from prior exam. Subcarinal node 1.1 cm in short axis on image 30/3, formerly 0.5 cm. Bilateral gynecomastia. Lungs/Pleura: Small right pleural effusion. There is  complete occlusion of the right upper lobe bronchus. There is atelectasis and possibly some mild alveolar filling airspace opacity posteriorly in the right upper lobe, with aerated anterior portion of the right upper lobe. Airway thickening along the bronchus intermedius resulting in some luminal narrowing, with airway thickening centrally in the right middle lobe and right lower lobe. Superior segment right lower lobe airspace opacity on image 67/5 could be from prior radiation therapy though infection is not excluded. Fiducials are noted in the superior segment right lower lobe. Further inferiorly in the vicinity of the prior pulmonary nodule, there is indistinct patchy opacity which could be from radiation pneumonitis or pneumonia, for example as shown on image 93/5. The original nodule is not readily apparent against this background density. There is some associated surrounding ground-glass opacity. There is some faint nodularity peripherally in the left upper lobe. On image 62/5, a triangular 0.4 by 0.3 cm nodule is new or significantly increased from prior. Indistinct ground-glass density nodule in the left upper lobe measuring 0.9 cm in diameter on image 78/5 is new. Subsegmental atelectasis is present anteriorly in left lower lobe. Small fiducials are present anteriorly in the left lower lobe and there are bandlike opacities along the left hemidiaphragm with crowding of bronchovascular structures suggesting some degree of volume loss. The lobulated nodule along the inferior pulmonary ligament is somewhat obscured by surrounding atelectasis, for example on image 114/5, but is less sharply defined and possibly less prominent. There is some additional nodularity in this vicinity, for example posteriorly on image 112/5 where there is a 1.7 by 1.3 cm ill-defined nodular density, probably inflammatory. Musculoskeletal: Thoracic spondylosis. CT ABDOMEN PELVIS FINDINGS Hepatobiliary: Cholecystectomy. The common bile  duct measures 1.1 cm in diameter, not changed from 08/15/2017, probably a physiologic response to cholecystectomy. No focal liver lesion is identified. Pancreas: Unremarkable Spleen: Unremarkable Adrenals/Urinary Tract: 1.7 by 1.8 cm left adrenal mass is unchanged in size from 08/15/2017 and was not previously hypermetabolic, hence likely benign. Small hypodense lesions of the left mid kidney right kidney upper pole probably cysts but technically too small to characterize. Considerable vascular calcifications along the renal hila bilaterally. No definite urinary tract calculi. Stomach/Bowel: Unremarkable Vascular/Lymphatic: Aortoiliac atherosclerotic vascular disease. No pathologic adenopathy identified. Reproductive: Faint calcification in the right posterior  prostate gland. Other: No supplemental non-categorized findings. Musculoskeletal: Incidental lipoma in the right vastus lateralis muscle. Degenerative disc disease at L4-5. IMPRESSION: 1. Both the right lower lobe pulmonary nodule on the left lower lobe pulmonary nodule are reduced in conspicuity with some degree of surrounding airspace opacity, possibly radiation pneumonitis if the patient has had interval radiation therapy. 2. Complete occlusion of the right upper lobe bronchus. Most of the right upper lobe is collapsed potentially with a small amount of airspace filling process; there is air trapping in portions of the right upper lobe anteriorly. 3. Airway thickening centrally in the right lung, resulting in some luminal narrowing of the bronchus intermedius. 4. There is a new 4 mm triangular-shaped left upper lobe pulmonary nodule along with a new small ground-glass density nodule in the left upper lobe which merit surveillance. 5. Mildly enlarged paratracheal and subcarinal lymph nodes likewise merits surveillance although could be reactive. 6. Other imaging findings of potential clinical significance: Aortic Atherosclerosis (ICD10-I70.0). Coronary  atherosclerosis. Mild cardiomegaly with suggestion of left ventricular hypertrophy. Small right pleural effusion. Chronically stable small left adrenal mass, not previously hypermetabolic on PET-CT and hence likely benign. Electronically Signed   By: Van Clines M.D.   On: 03/17/2019 15:11   Vas [place] Lower Extremity Venous (dvt)  Result Date: 03/17/2019  Lower Venous Study Indications: Stroke.  Performing Technologist: Maudry Mayhew MHA, RDMS, RVT, RDCS  Examination Guidelines: A complete evaluation includes B-mode imaging, spectral Doppler, color Doppler, and power Doppler as needed of all accessible portions of each vessel. Bilateral testing is considered an integral part of a complete examination. Limited examinations for reoccurring indications may be performed as noted.  +---------+---------------+---------+-----------+----------+-------+  RIGHT     Compressibility Phasicity Spontaneity Properties Summary  +---------+---------------+---------+-----------+----------+-------+  CFV       Full            Yes       Yes                             +---------+---------------+---------+-----------+----------+-------+  SFJ       Full                                                      +---------+---------------+---------+-----------+----------+-------+  FV Prox   Full                                                      +---------+---------------+---------+-----------+----------+-------+  FV Mid    Full                                                      +---------+---------------+---------+-----------+----------+-------+  FV Distal Full                                                      +---------+---------------+---------+-----------+----------+-------+  PFV       Full                                                      +---------+---------------+---------+-----------+----------+-------+  POP       Full            Yes       Yes                              +---------+---------------+---------+-----------+----------+-------+  PTV       Full                                                      +---------+---------------+---------+-----------+----------+-------+  PERO      Full                                                      +---------+---------------+---------+-----------+----------+-------+   +---------+---------------+---------+-----------+----------+-------+  LEFT      Compressibility Phasicity Spontaneity Properties Summary  +---------+---------------+---------+-----------+----------+-------+  CFV       Full            Yes       Yes                             +---------+---------------+---------+-----------+----------+-------+  SFJ       Full                                                      +---------+---------------+---------+-----------+----------+-------+  FV Prox   Full                                                      +---------+---------------+---------+-----------+----------+-------+  FV Mid    Full                                                      +---------+---------------+---------+-----------+----------+-------+  FV Distal Full                                                      +---------+---------------+---------+-----------+----------+-------+  PFV       Full                                                      +---------+---------------+---------+-----------+----------+-------+  POP       Full            Yes       Yes                             +---------+---------------+---------+-----------+----------+-------+  PTV       None                      No                     Acute    +---------+---------------+---------+-----------+----------+-------+  PERO      Full                                                      +---------+---------------+---------+-----------+----------+-------+     Summary: Right: There is no evidence of deep vein thrombosis in the lower extremity. No cystic structure found in the popliteal fossa. Left: Findings  consistent with acute deep vein thrombosis involving the left posterior tibial vein. No cystic structure found in the popliteal fossa.  *See table(s) above for measurements and observations.    Preliminary         Scheduled Meds:  allopurinol  100 mg Oral QHS   amLODipine  10 mg Oral Daily   apixaban  5 mg Oral BID   aspirin EC  81 mg Oral Daily   benazepril  40 mg Oral Q2200   furosemide  40 mg Oral Daily   insulin aspart  0-15 Units Subcutaneous TID WC   insulin aspart  0-5 Units Subcutaneous QHS   ipratropium-albuterol  3 mL Nebulization TID   metoprolol tartrate  100 mg Oral BID   mirtazapine  15 mg Oral QHS   Continuous Infusions:   LOS: 2 days    Time spent: 25 minutes spent in the coordination of care today.    Author:  Berle Mull, MD [facility] 03/17/2019   To reach On-call, see care teams to locate the attending and reach out to them via [email or web address]. If 7PM-7AM, please contact night-coverage If you still have difficulty reaching the attending provider, please page the [facility] (Director on Call) for [facility] on amion for assistance.

## 2019-03-17 NOTE — Progress Notes (Signed)
SLP Cancellation Note  Patient Details Name: Travis [name] MRN: [id number] DOB: Dec 05, 1950   Cancelled treatment:       Reason Eval/Treat Not Completed: Patient at procedure or test/unavailable. Pt off unit and nursing reported that he is currently in radiology. SLP will follow up and re-attempt as able.   Aerik Polan I. Hardin Negus, [place], [place] Office number [phone] Pager [phone]  [name] 03/17/2019, 2:28 PM

## 2019-03-17 NOTE — Progress Notes (Signed)
Physical Therapy Treatment Patient Details Name: Travis [name] MRN: [id number] DOB: May 24, 1951 Today's Date: 03/17/2019    History of Present Illness Travis [name] is an 68 y.o. male who presents to the ED today with symptoms of dizziness with N/V since early this AM at 0100.  Of note, O2 saturations in the ED were 89-92%, so he was started on 2L [place]. 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 CT head revealed a subacute right cerebellar hemisphere ischemic infarction    PT Comments    Pt in bed on 2 lts AxO x 4 and eager to walk.  Assisted OOB to bathroom.  General bed mobility comments: HOB and use of rail pt able however ataxic mvts required supervision for safety. General transfer comment: 25% VC's on proper hand placement and increased assist with turns for safety.  Very unsteady.  Poor balance with over correction.  Ataxic.  Also assisted with toilet transfer.  General Gait Details: VERY unsteady gait with ataxic mvts and over corrected balance.  Poor self correction to midline.  Increased assist esp with turns and backward gait.  HIGH FALL RISK.  Pt progressing well.    Follow Up Recommendations  CIR     Equipment Recommendations  None recommended by PT    Recommendations for Other Services Rehab consult     Precautions / Restrictions Precautions Precautions: Fall Precaution Comments: subacute right cerebellar hemisphere ischemic infarction Restrictions Weight Bearing Restrictions: No    Mobility  Bed Mobility Overal bed mobility: Needs Assistance Bed Mobility: Supine to Sit     Supine to sit: Supervision     General bed mobility comments: HOB and use of rail pt able however ataxic mvts required supervision for safety.  Transfers Overall transfer level: Needs assistance Equipment used: Rolling walker (2 wheeled);None Transfers: Sit to/from [facility] Sit to Stand: Mod  assist;Min assist Stand pivot transfers: Mod assist       General transfer comment: 25% VC's on proper hand placement and increased assist with turns for safety.  Very unsteady.  Poor balance with over correction.  Ataxic.  Also assisted with toilet transfer.    Ambulation/Gait Ambulation/Gait assistance: Mod assist;Max assist Gait Distance (Feet): 55 Feet(8 feet to bathroom then another 47 feet in hallway) Assistive device: Rolling walker (2 wheeled) Gait Pattern/deviations: Step-to pattern;Step-through pattern;Ataxic;Staggering left;Staggering right     General Gait Details: VERY unsteady gait with ataxic mvts and over corrected balance.  Poor self correction to midline.  Increased assist esp with turns and backward gait.  HIGH FALL RISK.   Stairs             Wheelchair Mobility    Modified Rankin (Stroke Patients Only)       Balance                                            Cognition Arousal/Alertness: Awake/alert Behavior During Therapy: WFL for tasks assessed/performed                                   General Comments: AxOx4 motivated and eager "get better"      Exercises      General Comments  Pertinent Vitals/Pain Pain Assessment: No/denies pain    Home Living                      Prior Function            PT Goals (current goals can now be found in the care plan section) Progress towards PT goals: Progressing toward goals    Frequency    Min 4X/week      PT Plan Current plan remains appropriate    Co-evaluation              AM-PAC PT "6 Clicks" Mobility   Outcome Measure  Help needed turning from your back to your side while in a flat bed without using bedrails?: A Little Help needed moving from lying on your back to sitting on the side of a flat bed without using bedrails?: A Little Help needed moving to and from a bed to a chair (including a wheelchair)?: A Lot Help  needed standing up from a chair using your arms (e.g., wheelchair or bedside chair)?: A Lot Help needed to walk in hospital room?: A Lot Help needed climbing 3-5 steps with a railing? : Total 6 Click Score: 13    End of Session Equipment Utilized During Treatment: Gait belt Activity Tolerance: Patient tolerated treatment well Patient left: in chair;with call bell/phone within reach;with chair alarm set Nurse Communication: Mobility status PT Visit Diagnosis: Unsteadiness on feet (R26.81);Other abnormalities of gait and mobility (R26.89);Difficulty in walking, not elsewhere classified (R26.2);Ataxic gait (R26.0)     Time: 1245-8099 PT Time Calculation (min) (ACUTE ONLY): 27 min  Charges:  $Gait Training: 8-22 mins $Therapeutic Activity: 8-22 mins                     Rica Koyanagi PTA Physical Therapist Assistant [facility] Pager [phone]

## 2019-03-17 NOTE — Progress Notes (Signed)
  Speech Language Pathology Treatment: Cognitive-Linquistic(Dysarthria)  Patient Details Name: Travis [name] MRN: [id number] DOB: 1951-10-24 Today's Date: 03/17/2019 Time: 9163-8466 SLP Time Calculation (min) (ACUTE ONLY): 15 min  Assessment / Plan / Recommendation Clinical Impression  Pt was seen for dysarthria treatment and reported that his speech has improved further to 45% back to baseline. He completed lingual ROM and strengthening exercises with mod cues. He was educated regarding compensatory strategies for speech intelligibility and verbalized understanding regarding them. He was able to use these strategies at the phrase level with 80% accuracy increasing to 100% accuracy with mod cues. At the 5-7 word sentence level he demonstrated 60% accuracy increasing to 100% accuracy with mod cues. SLP will continue to follow.    HPI HPI: Pt  is a 68 y.o. male with medical history significant of DM2, HLD, COPD, CAD who presented with nausea, vomiting, difficulty expressing his words, and dizziness. MRI of the brain revealed 3 cm region of acute infarction in the right superolateral cerebellum.      SLP Plan  Continue with current plan of care       Recommendations                   Follow up Recommendations: Inpatient Rehab SLP Visit Diagnosis: Dysarthria and anarthria (R47.1) Plan: Continue with current plan of care       Travis [name] I. Hardin Negus, [place], [place] Office number [phone] Pager [phone]               Travis [name] 03/17/2019, 4:52 PM

## 2019-03-17 NOTE — Progress Notes (Signed)
Inpatient Rehabilitation Admissions Coordinator  Inpatient Rehab Consult received. I met with patient at the bedside for rehabilitation assessment. We discussed goals and expectations of an inpatient rehab admission.  Patient prefers an inpt rehab admission and is a candidate to be admitted once medical workup is complete. I will follow up tomorrow.  Danne Baxter, RN, MSN Rehab Admissions Coordinator [phone] 03/17/2019 12:51 PM

## 2019-03-17 NOTE — Progress Notes (Signed)
Lower extremity venous duplex completed. Critical results discussed with Dr. Erlinda Hong.  03/17/2019 8:35 AM Maudry Mayhew, MHA, RVT, RDCS, RDMS

## 2019-03-17 NOTE — Progress Notes (Signed)
STROKE TEAM PROGRESS NOTE   INTERVAL HISTORY Pt lying in bed, on telephone with family. Off NRB, still has mild respiratory distress, but tolerating well.  Left-sided ataxia much improved.  Creatinine also improved.  Will do CT chest abdomen pelvis with contrast.  LE venous Doppler found to have left calf DVT.  Will do TCD bubble study.   Vitals:   03/17/19 0723 03/17/19 0731 03/17/19 0813 03/17/19 1127  BP:  (!) 147/83 131/86 (!) 141/66  Pulse:  79 99 72  Resp:   (!) 22   Temp:  98.1 F (36.7 C) 97.9 F (36.6 C) 97.9 F (36.6 C)  TempSrc:  Oral Oral Oral  SpO2: 93% 96% 98% 96%  Weight:      Height:        CBC:  Recent Labs  Lab 03/15/19 1036 03/16/19 0617 03/17/19 0426  WBC 9.6 10.2 8.8  NEUTROABS 8.6*  --  7.1  HGB 14.1 12.4* 13.2  HCT 45.6 40.4 42.3  MCV 86.5 86.7 86.0  PLT 277 261 846    Basic Metabolic Panel:  Recent Labs  Lab 03/16/19 0617 03/17/19 0426  NA 144 142  K 4.9 4.7  CL 111 108  CO2 24 26  GLUCOSE 164* 180*  BUN 42* 33*  CREATININE 1.60* 1.39*  CALCIUM 8.6* 8.7*  MG  --  2.0  PHOS  --  3.0   Lipid Panel:     Component Value Date/Time   CHOL 90 03/16/2019 0617   TRIG 95 03/16/2019 0617   HDL 25 (L) 03/16/2019 0617   CHOLHDL 3.6 03/16/2019 0617   VLDL 19 03/16/2019 0617   LDLCALC 46 03/16/2019 0617   HgbA1c:  Lab Results  Component Value Date   HGBA1C 8.1 (H) 03/16/2019   Urine Drug Screen: No results found for: LABOPIA, COCAINSCRNUR, LABBENZ, AMPHETMU, THCU, LABBARB  Alcohol Level No results found for: ETH  IMAGING Ct Head Wo Contrast  Result Date: 03/15/2019 CLINICAL DATA:  68 year old male with acute dizziness and ataxia. EXAM: CT HEAD WITHOUT CONTRAST TECHNIQUE: Contiguous axial images were obtained from the base of the skull through the vertex without intravenous contrast. COMPARISON:  None. FINDINGS: Brain: An infarct within the SUPERIOR RIGHT cerebellum appears acute or subacute. There is no evidence of hemorrhage. Mild  atrophy and chronic small-vessel white matter ischemic changes are noted. No mass lesion, midline shift, hydrocephalus or extra-axial collection noted. Vascular: Carotid and vertebral atherosclerotic calcifications noted. Skull: Normal. Negative for fracture or focal lesion. Sinuses/Orbits: No acute finding. Other: None. IMPRESSION: 1. Acute to subacute RIGHT cerebellar infarct. No evidence of hemorrhage. 2. Atrophy and chronic small-vessel white matter ischemic changes. Electronically Signed   By: Margarette Canada M.D.   On: 03/15/2019 12:49   Mr Jodene Nam Head Wo Contrast  Result Date: 03/15/2019 CLINICAL DATA:  Dizziness.  Abnormal cord in a shin. EXAM: MRI HEAD WITHOUT CONTRAST MRA HEAD WITHOUT CONTRAST TECHNIQUE: Multiplanar, multiecho pulse sequences of the brain and surrounding structures were obtained without intravenous contrast. Angiographic images of the head were obtained using MRA technique without contrast. COMPARISON:  Head CT same day FINDINGS: MRI HEAD FINDINGS Brain: Diffusion imaging confirms the presence of a 3 cm region of acute infarction in the right superior and lateral cerebellum. Mild swelling but no hemorrhage or mass effect. No other acute infarction. Elsewhere, there chronic small-vessel ischemic changes of the pons. There are moderate chronic small-vessel ischemic changes of the cerebral hemispheric white matter. No large vessel territory infarction. No mass lesion,  hemorrhage, hydrocephalus or extra-axial collection. Vascular: Major vessels at the base of the brain show flow. Skull and upper cervical spine: Negative Sinuses/Orbits: Clear/normal Other: None MRA HEAD FINDINGS Both internal carotid arteries are widely patent through the skull base and siphon regions. The anterior and middle cerebral vessels are patent without proximal stenosis, aneurysm or vascular malformation. Both vertebral arteries are patent at the foramen magnum and to the basilar. No basilar stenosis. Posterior inferior  cerebellar arteries show flow on each side. Right anterior inferior cerebellar artery is seen. Both superior cerebellar arteries show flow presently. Both posterior cerebral arteries show flow. IMPRESSION: 3 cm region of acute infarction in the right superolateral cerebellum. No hemorrhage or mass effect. Moderate chronic small-vessel ischemic changes of the pons and cerebral hemispheric white matter. Intracranial MR angiography negative for large or medium vessel occlusion. Specifically, no proximal posterior circulation occlusion seen to explain the infarction. Electronically Signed   By: Nelson Chimes M.D.   On: 03/15/2019 14:56   Mr Brain Wo Contrast  Result Date: 03/15/2019 CLINICAL DATA:  Dizziness.  Abnormal cord in a shin. EXAM: MRI HEAD WITHOUT CONTRAST MRA HEAD WITHOUT CONTRAST TECHNIQUE: Multiplanar, multiecho pulse sequences of the brain and surrounding structures were obtained without intravenous contrast. Angiographic images of the head were obtained using MRA technique without contrast. COMPARISON:  Head CT same day FINDINGS: MRI HEAD FINDINGS Brain: Diffusion imaging confirms the presence of a 3 cm region of acute infarction in the right superior and lateral cerebellum. Mild swelling but no hemorrhage or mass effect. No other acute infarction. Elsewhere, there chronic small-vessel ischemic changes of the pons. There are moderate chronic small-vessel ischemic changes of the cerebral hemispheric white matter. No large vessel territory infarction. No mass lesion, hemorrhage, hydrocephalus or extra-axial collection. Vascular: Major vessels at the base of the brain show flow. Skull and upper cervical spine: Negative Sinuses/Orbits: Clear/normal Other: None MRA HEAD FINDINGS Both internal carotid arteries are widely patent through the skull base and siphon regions. The anterior and middle cerebral vessels are patent without proximal stenosis, aneurysm or vascular malformation. Both vertebral arteries are  patent at the foramen magnum and to the basilar. No basilar stenosis. Posterior inferior cerebellar arteries show flow on each side. Right anterior inferior cerebellar artery is seen. Both superior cerebellar arteries show flow presently. Both posterior cerebral arteries show flow. IMPRESSION: 3 cm region of acute infarction in the right superolateral cerebellum. No hemorrhage or mass effect. Moderate chronic small-vessel ischemic changes of the pons and cerebral hemispheric white matter. Intracranial MR angiography negative for large or medium vessel occlusion. Specifically, no proximal posterior circulation occlusion seen to explain the infarction. Electronically Signed   By: Nelson Chimes M.D.   On: 03/15/2019 14:56   Vas [place] Lower Extremity Venous (dvt)  Result Date: 03/17/2019  Lower Venous Study Indications: Stroke.  Performing Technologist: Maudry Mayhew MHA, RDMS, RVT, RDCS  Examination Guidelines: A complete evaluation includes B-mode imaging, spectral Doppler, color Doppler, and power Doppler as needed of all accessible portions of each vessel. Bilateral testing is considered an integral part of a complete examination. Limited examinations for reoccurring indications may be performed as noted.  +---------+---------------+---------+-----------+----------+-------+ RIGHT    CompressibilityPhasicitySpontaneityPropertiesSummary +---------+---------------+---------+-----------+----------+-------+ CFV      Full           Yes      Yes                          +---------+---------------+---------+-----------+----------+-------+ SFJ  Full                                                 +---------+---------------+---------+-----------+----------+-------+ FV Prox  Full                                                 +---------+---------------+---------+-----------+----------+-------+ FV Mid   Full                                                  +---------+---------------+---------+-----------+----------+-------+ FV DistalFull                                                 +---------+---------------+---------+-----------+----------+-------+ PFV      Full                                                 +---------+---------------+---------+-----------+----------+-------+ POP      Full           Yes      Yes                          +---------+---------------+---------+-----------+----------+-------+ PTV      Full                                                 +---------+---------------+---------+-----------+----------+-------+ PERO     Full                                                 +---------+---------------+---------+-----------+----------+-------+   +---------+---------------+---------+-----------+----------+-------+ LEFT     CompressibilityPhasicitySpontaneityPropertiesSummary +---------+---------------+---------+-----------+----------+-------+ CFV      Full           Yes      Yes                          +---------+---------------+---------+-----------+----------+-------+ SFJ      Full                                                 +---------+---------------+---------+-----------+----------+-------+ FV Prox  Full                                                 +---------+---------------+---------+-----------+----------+-------+ FV Mid   Full                                                 +---------+---------------+---------+-----------+----------+-------+  FV DistalFull                                                 +---------+---------------+---------+-----------+----------+-------+ PFV      Full                                                 +---------+---------------+---------+-----------+----------+-------+ POP      Full           Yes      Yes                          +---------+---------------+---------+-----------+----------+-------+ PTV      None                     No                   Acute   +---------+---------------+---------+-----------+----------+-------+ PERO     Full                                                 +---------+---------------+---------+-----------+----------+-------+     Summary: Right: There is no evidence of deep vein thrombosis in the lower extremity. No cystic structure found in the popliteal fossa. Left: Findings consistent with acute deep vein thrombosis involving the left posterior tibial vein. No cystic structure found in the popliteal fossa.  *See table(s) above for measurements and observations.    Preliminary    2D Echocardiogram  1. The left ventricle has normal systolic function, with an ejection fraction of 60-65%. The cavity size was normal. There is moderately increased left ventricular wall thickness. Left ventricular diastolic Doppler parameters are consistent with  impaired relaxation.  2. The right ventricle has normal systolc function. The cavity was normal.  3. The aortic valve is tricuspid Mild calcification of the aortic valve. No stenosis of the aortic valve.  4. The inferior vena cava was dilated in size with >50% respiratory variability.  5. Technically difficult; definity used; normal LV systolic function; moderate LVH; probable mild diastolic dysfunction; calcified aortic valve with reduced excursion of left coronary cusp but no AS by doppler.   PHYSICAL EXAM  Temp:  [97.7 F (36.5 C)-98.2 F (36.8 C)] 97.9 F (36.6 C) (05/06 1127) Pulse Rate:  [70-99] 72 (05/06 1127) Resp:  [13-22] 22 (05/06 0813) BP: (125-154)/(62-86) 141/66 (05/06 1127) SpO2:  [89 %-98 %] 96 % (05/06 1127)  General - Well nourished, well developed, in mild respiratory distress on [place]  Ophthalmologic - fundi not visualized due to noncooperation.  Cardiovascular - Regular rate and rhythm.  Mental Status -  Level of arousal and orientation to time, place, and person were intact. Language including  expression, naming, repetition, comprehension was assessed and found intact.  Mild dysarthria  Cranial Nerves II - XII - II - Visual field intact OU. III, IV, VI - Extraocular movements intact. V - Facial sensation intact bilaterally. VII - Facial movement intact bilaterally. VIII - Hearing & vestibular intact bilaterally. X - Palate elevates symmetrically. XI - Chin turning &  shoulder shrug intact bilaterally. XII - Tongue protrusion intact.  Motor Strength - The patient's strength was normal in all extremities and pronator drift was absent.  Bulk was normal and fasciculations were absent.   Motor Tone - Muscle tone was assessed at the neck and appendages and was normal.  Reflexes - The patient's reflexes were symmetrical in all extremities and he had no pathological reflexes.  Sensory - Light touch, temperature/pinprick were assessed and were symmetrical.    Coordination - The patient had normal movements in the left hand and foot with no ataxia or dysmetria.  However, left finger-to-nose and heel-to-shin slight ataxia, improved from yesterday.  Tremor was absent.  Gait and Station - deferred.   ASSESSMENT/PLAN Mr. LEMONTE AL is a 68 y.o. male with history of PE 2 mos ago on Eliquis, B squamous cell cancer of the lungs, CAD s/p PCA, COPD, DB, HTN, HLD, CKD stage 2, PVD and tobacco abuse presenting with dizziness, nausea and vomiting, trouble saying words.   Stroke:   R SCA territory cerebellar infarct even on aspirin and Eliquis, embolic secondary to unknown source.  DDX including 1.  Hypercoagulable state due to advanced malignancy - will need to check CT C/T/P with contrast as well as MRI brain with contrast 2.  Paradoxical emboli - patient had symptoms after severe coughing, and there is acute DVT at LLE 3.  Vertebral artery dissection - patient had stroke symptoms after severe coughing, need to check CTA neck  CT head R cerebellar infarct. Small vessel disease. Atrophy.   MRI   R superolateral cerebellar infarct. Mod Small vessel disease.   MRA  Unremarkable   Consider CTA neck once cre normalizes  Carotid Doppler  B ICA 1-39% stenosis, VAs antegrade (March 2020)  2D Echo EF 60-65%. No source of embolus   LE venous Doppler L posterior tibial acute DVT  TCD bubble study pending  LDL 96  HgbA1c 8.1  HIV neg  Lovenox 30 mg sq daily for VTE prophylaxis  On diet  aspirin 81 mg daily and Eliquis (apixaban) daily prior to admission, now on aspirin 81 mg daily and Eliquis (apixaban) daily. Continue at d.c  Therapy recommendations:  CIR  Disposition:  pending   DVT on Eliquis  LE venous Doppler L posterior tibial acute DVT  Will do TCD bubble study to evaluate PFO  Concerning for malignancy given DVT on eliquis  Continue aspirin 81 and eliquis 5 bid  History of PE  As per chart, he was dyspneic in December 2019 and hypoxic.   V/Q scan at that time with possible left PE.   He was started on Eliquis.  ?? Squamous cell lung cancer in 2019  Follow with [place]  Patient stated that he had a 3 biopsy in 3 different locations, but all were negative for cancer  Anyway, he had radiation therapy for his lung nodules  CT C/T/P with contrast pending   May also consider MRI brain with contrast to rule out brain metastasis  AKI on CKD stage 2, improving  Creatinine 2.27-2.05-1.60-1.39  Encourage p.o. intake  Gentle hydration  BMP monitoring  Mild respiratory distress  History of smoking and COPD as always lung nodule/cancer  Off NSR, on [place]  COVID neg  Off Levaquin  Treatment per primary team  Hypertension  Stable  On home metoprolol, Norvasc, Lasix and Lotensin . Permissive hypertension (OK if <180/105) but gradually normalize in 2-3 days . Long-term BP goal normotensive  Diabetes type II Uncontrolled  HgbA1c 8.1, goal < 7.0  SSI  CBG monitoring  Close PCP follow-up  Tobacco abuse  Current smoker  Smoking  cessation counseling provided  Pt is willing to quit  Other Stroke Risk Factors  Advanced age  Hx smokeless tobacco use  Coronary artery disease MI s/p stenting and PCI  PVD  Other Active Problems    Hospital day # 2  I spent  35 minutes in total face-to-face time with the patient, more than 50% of which was spent in counseling and coordination of care, reviewing test results, images and medication, and discussing the diagnosis of cerebellar stroke, acute DVT, history PE, questionable lung cancer with metastasis, respirate distress, treatment plan and potential prognosis. This patient's care requiresreview of multiple databases, neurological assessment, discussion with family, other specialists and medical decision making of high complexity.  Rosalin Hawking, MD PhD Stroke Neurology 03/17/2019 11:28 AM    To contact Stroke Continuity provider, please refer to [email or web address] After hours, contact General Neurology

## 2019-03-17 NOTE — Progress Notes (Signed)
Patient cbg reported from NT was 153 verbally

## 2019-03-18 ENCOUNTER — Inpatient Hospital Stay (HOSPITAL_COMMUNITY): Payer: Medicare Other

## 2019-03-18 DIAGNOSIS — R911 Solitary pulmonary nodule: Secondary | ICD-10-CM

## 2019-03-18 DIAGNOSIS — E7800 Pure hypercholesterolemia, unspecified: Secondary | ICD-10-CM

## 2019-03-18 DIAGNOSIS — I639 Cerebral infarction, unspecified: Secondary | ICD-10-CM

## 2019-03-18 LAB — BASIC METABOLIC PANEL
Anion gap: 10 (ref 5–15)
BUN: 32 mg/dL — ABNORMAL HIGH (ref 8–23)
CO2: 24 mmol/L (ref 22–32)
Calcium: 8.8 mg/dL — ABNORMAL LOW (ref 8.9–10.3)
Chloride: 106 mmol/L (ref 98–111)
Creatinine, Ser: 1.51 mg/dL — ABNORMAL HIGH (ref 0.61–1.24)
GFR calc Af Amer: 54 mL/min — ABNORMAL LOW (ref >60)
GFR calc non Af Amer: 46 mL/min — ABNORMAL LOW (ref >60)
Glucose, Bld: 191 mg/dL — ABNORMAL HIGH (ref 70–99)
Potassium: 4.8 mmol/L (ref 3.5–5.1)
Sodium: 140 mmol/L (ref 135–145)

## 2019-03-18 LAB — GLUCOSE, CAPILLARY
Glucose-Capillary: 165 mg/dL — ABNORMAL HIGH (ref 70–99)
Glucose-Capillary: 233 mg/dL — ABNORMAL HIGH (ref 70–99)
Glucose-Capillary: 168 mg/dL — ABNORMAL HIGH (ref 70–99)
Glucose-Capillary: 164 mg/dL — ABNORMAL HIGH (ref 70–99)
Glucose-Capillary: 153 mg/dL — ABNORMAL HIGH (ref 70–99)
Glucose-Capillary: 191 mg/dL — ABNORMAL HIGH (ref 70–99)
Glucose-Capillary: 167 mg/dL — ABNORMAL HIGH (ref 70–99)
Glucose-Capillary: 212 mg/dL — ABNORMAL HIGH (ref 70–99)
Glucose-Capillary: 150 mg/dL — ABNORMAL HIGH (ref 70–99)
Glucose-Capillary: 244 mg/dL — ABNORMAL HIGH (ref 70–99)

## 2019-03-18 LAB — CBC
HCT: 43.3 % (ref 39.0–52.0)
Hemoglobin: 13.5 g/dL (ref 13.0–17.0)
MCH: 26.6 pg (ref 26.0–34.0)
MCHC: 31.2 g/dL (ref 30.0–36.0)
MCV: 85.2 fL (ref 80.0–100.0)
Platelets: 228 10*3/uL (ref 150–400)
RBC: 5.08 MIL/uL (ref 4.22–5.81)
RDW: 13.9 % (ref 11.5–15.5)
WBC: 8.6 10*3/uL (ref 4.0–10.5)
nRBC: 0 % (ref 0.0–0.2)

## 2019-03-18 LAB — MAGNESIUM: Magnesium: 1.9 mg/dL (ref 1.7–2.4)

## 2019-03-18 MED ORDER — APIXABAN 5 MG PO TABS
10.0000 mg | ORAL_TABLET | Freq: Two times a day (BID) | ORAL | Status: DC
  Administered 2019-03-18 – 2019-03-19 (×2): 10 mg via ORAL
  Filled 2019-03-18 (×2): qty 2

## 2019-03-18 MED ORDER — APIXABAN 5 MG PO TABS: 5.0000 mg | ORAL_TABLET | Freq: Two times a day (BID) | ORAL | Status: DC

## 2019-03-18 MED ORDER — APIXABAN 5 MG PO TABS
5.0000 mg | ORAL_TABLET | ORAL | Status: AC
  Administered 2019-03-18: 5 mg via ORAL
  Filled 2019-03-18: qty 1

## 2019-03-18 MED ORDER — DOXYCYCLINE HYCLATE 100 MG PO TABS
100.0000 mg | ORAL_TABLET | Freq: Two times a day (BID) | ORAL | Status: DC
  Administered 2019-03-18 – 2019-03-19 (×3): 100 mg via ORAL
  Filled 2019-03-18 (×3): qty 1

## 2019-03-18 NOTE — Progress Notes (Signed)
STROKE TEAM PROGRESS NOTE   INTERVAL HISTORY Pt lying in bed, not in distress. Still on [place], no complains. CT C/A/P no malignancy. TCD bubble study at bedside showed no PFO. Continue eliquis and ASA. Cre elevated from yesterday, will monitor in am, if down-trending, will consider CTA neck to rule out dissection of VA.    Vitals:   03/18/19 0747 03/18/19 0801 03/18/19 1039 03/18/19 1151  BP:  (!) 141/76  106/78  Pulse:  (!) 56 (!) 26 72  Resp:  16  16  Temp:  98.2 F (36.8 C)  97.8 F (36.6 C)  TempSrc:  Oral  Oral  SpO2: 92% 98%  98%  Weight:      Height:        CBC:  Recent Labs  Lab 03/15/19 1036  03/17/19 0426 03/18/19 0432  WBC 9.6   < > 8.8 8.6  NEUTROABS 8.6*  --  7.1  --   HGB 14.1   < > 13.2 13.5  HCT 45.6   < > 42.3 43.3  MCV 86.5   < > 86.0 85.2  PLT 277   < > 235 228   < > = values in this interval not displayed.    Basic Metabolic Panel:  Recent Labs  Lab 03/17/19 0426 03/18/19 0432  NA 142 140  K 4.7 4.8  CL 108 106  CO2 26 24  GLUCOSE 180* 191*  BUN 33* 32*  CREATININE 1.39* 1.51*  CALCIUM 8.7* 8.8*  MG 2.0 1.9  PHOS 3.0  --    Lipid Panel:     Component Value Date/Time   CHOL 90 03/16/2019 0617   TRIG 95 03/16/2019 0617   HDL 25 (L) 03/16/2019 0617   CHOLHDL 3.6 03/16/2019 0617   VLDL 19 03/16/2019 0617   LDLCALC 46 03/16/2019 0617   HgbA1c:  Lab Results  Component Value Date   HGBA1C 8.1 (H) 03/16/2019   Urine Drug Screen: No results found for: LABOPIA, COCAINSCRNUR, LABBENZ, AMPHETMU, THCU, LABBARB  Alcohol Level No results found for: [facility]  IMAGING Ct Chest W Contrast  Result Date: 03/17/2019 CLINICAL DATA:  Squamous cell carcinoma the lungs. Right cerebellar stroke. EXAM: CT CHEST, ABDOMEN, AND PELVIS WITH CONTRAST TECHNIQUE: Multidetector CT imaging of the chest, abdomen and pelvis was performed following the standard protocol during bolus administration of intravenous contrast. CONTRAST:  79mL OMNIPAQUE IOHEXOL 300 MG/ML  SOLN  COMPARISON:  Chest CT from 06/04/2018 and PET-CT from 08/15/2017 FINDINGS: CT CHEST FINDINGS Cardiovascular: Coronary, aortic arch, and branch vessel atherosclerotic vascular disease. Mild cardiomegaly. Interventricular septal thickening suggesting left ventricular hypertrophy. Mediastinum/Nodes: Right paratracheal node 1.0 cm in short axis on image 19/3, formerly 0.6 cm. Additional small right paratracheal lymph nodes are below 1 cm in diameter but mildly increased from prior exam. Subcarinal node 1.1 cm in short axis on image 30/3, formerly 0.5 cm. Bilateral gynecomastia. Lungs/Pleura: Small right pleural effusion. There is complete occlusion of the right upper lobe bronchus. There is atelectasis and possibly some mild alveolar filling airspace opacity posteriorly in the right upper lobe, with aerated anterior portion of the right upper lobe. Airway thickening along the bronchus intermedius resulting in some luminal narrowing, with airway thickening centrally in the right middle lobe and right lower lobe. Superior segment right lower lobe airspace opacity on image 67/5 could be from prior radiation therapy though infection is not excluded. Fiducials are noted in the superior segment right lower lobe. Further inferiorly in the vicinity of the prior pulmonary  nodule, there is indistinct patchy opacity which could be from radiation pneumonitis or pneumonia, for example as shown on image 93/5. The original nodule is not readily apparent against this background density. There is some associated surrounding ground-glass opacity. There is some faint nodularity peripherally in the left upper lobe. On image 62/5, a triangular 0.4 by 0.3 cm nodule is new or significantly increased from prior. Indistinct ground-glass density nodule in the left upper lobe measuring 0.9 cm in diameter on image 78/5 is new. Subsegmental atelectasis is present anteriorly in left lower lobe. Small fiducials are present anteriorly in the left  lower lobe and there are bandlike opacities along the left hemidiaphragm with crowding of bronchovascular structures suggesting some degree of volume loss. The lobulated nodule along the inferior pulmonary ligament is somewhat obscured by surrounding atelectasis, for example on image 114/5, but is less sharply defined and possibly less prominent. There is some additional nodularity in this vicinity, for example posteriorly on image 112/5 where there is a 1.7 by 1.3 cm ill-defined nodular density, probably inflammatory. Musculoskeletal: Thoracic spondylosis. CT ABDOMEN PELVIS FINDINGS Hepatobiliary: Cholecystectomy. The common bile duct measures 1.1 cm in diameter, not changed from 08/15/2017, probably a physiologic response to cholecystectomy. No focal liver lesion is identified. Pancreas: Unremarkable Spleen: Unremarkable Adrenals/Urinary Tract: 1.7 by 1.8 cm left adrenal mass is unchanged in size from 08/15/2017 and was not previously hypermetabolic, hence likely benign. Small hypodense lesions of the left mid kidney right kidney upper pole probably cysts but technically too small to characterize. Considerable vascular calcifications along the renal hila bilaterally. No definite urinary tract calculi. Stomach/Bowel: Unremarkable Vascular/Lymphatic: Aortoiliac atherosclerotic vascular disease. No pathologic adenopathy identified. Reproductive: Faint calcification in the right posterior prostate gland. Other: No supplemental non-categorized findings. Musculoskeletal: Incidental lipoma in the right vastus lateralis muscle. Degenerative disc disease at L4-5. IMPRESSION: 1. Both the right lower lobe pulmonary nodule on the left lower lobe pulmonary nodule are reduced in conspicuity with some degree of surrounding airspace opacity, possibly radiation pneumonitis if the patient has had interval radiation therapy. 2. Complete occlusion of the right upper lobe bronchus. Most of the right upper lobe is collapsed potentially  with a small amount of airspace filling process; there is air trapping in portions of the right upper lobe anteriorly. 3. Airway thickening centrally in the right lung, resulting in some luminal narrowing of the bronchus intermedius. 4. There is a new 4 mm triangular-shaped left upper lobe pulmonary nodule along with a new small ground-glass density nodule in the left upper lobe which merit surveillance. 5. Mildly enlarged paratracheal and subcarinal lymph nodes likewise merits surveillance although could be reactive. 6. Other imaging findings of potential clinical significance: Aortic Atherosclerosis (ICD10-I70.0). Coronary atherosclerosis. Mild cardiomegaly with suggestion of left ventricular hypertrophy. Small right pleural effusion. Chronically stable small left adrenal mass, not previously hypermetabolic on PET-CT and hence likely benign. Electronically Signed   By: Van Clines M.D.   On: 03/17/2019 15:11   Ct Abdomen Pelvis W Contrast  Result Date: 03/17/2019 CLINICAL DATA:  Squamous cell carcinoma the lungs. Right cerebellar stroke. EXAM: CT CHEST, ABDOMEN, AND PELVIS WITH CONTRAST TECHNIQUE: Multidetector CT imaging of the chest, abdomen and pelvis was performed following the standard protocol during bolus administration of intravenous contrast. CONTRAST:  2mL OMNIPAQUE IOHEXOL 300 MG/ML  SOLN COMPARISON:  Chest CT from 06/04/2018 and PET-CT from 08/15/2017 FINDINGS: CT CHEST FINDINGS Cardiovascular: Coronary, aortic arch, and branch vessel atherosclerotic vascular disease. Mild cardiomegaly. Interventricular septal thickening suggesting left ventricular hypertrophy.  Mediastinum/Nodes: Right paratracheal node 1.0 cm in short axis on image 19/3, formerly 0.6 cm. Additional small right paratracheal lymph nodes are below 1 cm in diameter but mildly increased from prior exam. Subcarinal node 1.1 cm in short axis on image 30/3, formerly 0.5 cm. Bilateral gynecomastia. Lungs/Pleura: Small right pleural  effusion. There is complete occlusion of the right upper lobe bronchus. There is atelectasis and possibly some mild alveolar filling airspace opacity posteriorly in the right upper lobe, with aerated anterior portion of the right upper lobe. Airway thickening along the bronchus intermedius resulting in some luminal narrowing, with airway thickening centrally in the right middle lobe and right lower lobe. Superior segment right lower lobe airspace opacity on image 67/5 could be from prior radiation therapy though infection is not excluded. Fiducials are noted in the superior segment right lower lobe. Further inferiorly in the vicinity of the prior pulmonary nodule, there is indistinct patchy opacity which could be from radiation pneumonitis or pneumonia, for example as shown on image 93/5. The original nodule is not readily apparent against this background density. There is some associated surrounding ground-glass opacity. There is some faint nodularity peripherally in the left upper lobe. On image 62/5, a triangular 0.4 by 0.3 cm nodule is new or significantly increased from prior. Indistinct ground-glass density nodule in the left upper lobe measuring 0.9 cm in diameter on image 78/5 is new. Subsegmental atelectasis is present anteriorly in left lower lobe. Small fiducials are present anteriorly in the left lower lobe and there are bandlike opacities along the left hemidiaphragm with crowding of bronchovascular structures suggesting some degree of volume loss. The lobulated nodule along the inferior pulmonary ligament is somewhat obscured by surrounding atelectasis, for example on image 114/5, but is less sharply defined and possibly less prominent. There is some additional nodularity in this vicinity, for example posteriorly on image 112/5 where there is a 1.7 by 1.3 cm ill-defined nodular density, probably inflammatory. Musculoskeletal: Thoracic spondylosis. CT ABDOMEN PELVIS FINDINGS Hepatobiliary:  Cholecystectomy. The common bile duct measures 1.1 cm in diameter, not changed from 08/15/2017, probably a physiologic response to cholecystectomy. No focal liver lesion is identified. Pancreas: Unremarkable Spleen: Unremarkable Adrenals/Urinary Tract: 1.7 by 1.8 cm left adrenal mass is unchanged in size from 08/15/2017 and was not previously hypermetabolic, hence likely benign. Small hypodense lesions of the left mid kidney right kidney upper pole probably cysts but technically too small to characterize. Considerable vascular calcifications along the renal hila bilaterally. No definite urinary tract calculi. Stomach/Bowel: Unremarkable Vascular/Lymphatic: Aortoiliac atherosclerotic vascular disease. No pathologic adenopathy identified. Reproductive: Faint calcification in the right posterior prostate gland. Other: No supplemental non-categorized findings. Musculoskeletal: Incidental lipoma in the right vastus lateralis muscle. Degenerative disc disease at L4-5. IMPRESSION: 1. Both the right lower lobe pulmonary nodule on the left lower lobe pulmonary nodule are reduced in conspicuity with some degree of surrounding airspace opacity, possibly radiation pneumonitis if the patient has had interval radiation therapy. 2. Complete occlusion of the right upper lobe bronchus. Most of the right upper lobe is collapsed potentially with a small amount of airspace filling process; there is air trapping in portions of the right upper lobe anteriorly. 3. Airway thickening centrally in the right lung, resulting in some luminal narrowing of the bronchus intermedius. 4. There is a new 4 mm triangular-shaped left upper lobe pulmonary nodule along with a new small ground-glass density nodule in the left upper lobe which merit surveillance. 5. Mildly enlarged paratracheal and subcarinal lymph nodes likewise merits  surveillance although could be reactive. 6. Other imaging findings of potential clinical significance: Aortic  Atherosclerosis (ICD10-I70.0). Coronary atherosclerosis. Mild cardiomegaly with suggestion of left ventricular hypertrophy. Small right pleural effusion. Chronically stable small left adrenal mass, not previously hypermetabolic on PET-CT and hence likely benign. Electronically Signed   By: Van Clines M.D.   On: 03/17/2019 15:11   Vas [place] Lower Extremity Venous (dvt)  Result Date: 03/17/2019  Lower Venous Study Indications: Stroke.  Performing Technologist: Maudry Mayhew MHA, RDMS, RVT, RDCS  Examination Guidelines: A complete evaluation includes B-mode imaging, spectral Doppler, color Doppler, and power Doppler as needed of all accessible portions of each vessel. Bilateral testing is considered an integral part of a complete examination. Limited examinations for reoccurring indications may be performed as noted.  +---------+---------------+---------+-----------+----------+-------+ RIGHT    CompressibilityPhasicitySpontaneityPropertiesSummary +---------+---------------+---------+-----------+----------+-------+ CFV      Full           Yes      Yes                          +---------+---------------+---------+-----------+----------+-------+ SFJ      Full                                                 +---------+---------------+---------+-----------+----------+-------+ FV Prox  Full                                                 +---------+---------------+---------+-----------+----------+-------+ FV Mid   Full                                                 +---------+---------------+---------+-----------+----------+-------+ FV DistalFull                                                 +---------+---------------+---------+-----------+----------+-------+ PFV      Full                                                 +---------+---------------+---------+-----------+----------+-------+ POP      Full           Yes      Yes                           +---------+---------------+---------+-----------+----------+-------+ PTV      Full                                                 +---------+---------------+---------+-----------+----------+-------+ PERO     Full                                                 +---------+---------------+---------+-----------+----------+-------+   +---------+---------------+---------+-----------+----------+-------+  LEFT     CompressibilityPhasicitySpontaneityPropertiesSummary +---------+---------------+---------+-----------+----------+-------+ CFV      Full           Yes      Yes                          +---------+---------------+---------+-----------+----------+-------+ SFJ      Full                                                 +---------+---------------+---------+-----------+----------+-------+ FV Prox  Full                                                 +---------+---------------+---------+-----------+----------+-------+ FV Mid   Full                                                 +---------+---------------+---------+-----------+----------+-------+ FV DistalFull                                                 +---------+---------------+---------+-----------+----------+-------+ PFV      Full                                                 +---------+---------------+---------+-----------+----------+-------+ POP      Full           Yes      Yes                          +---------+---------------+---------+-----------+----------+-------+ PTV      None                    No                   Acute   +---------+---------------+---------+-----------+----------+-------+ PERO     Full                                                 +---------+---------------+---------+-----------+----------+-------+     Summary: Right: There is no evidence of deep vein thrombosis in the lower extremity. No cystic structure found in the popliteal fossa. Left: Findings  consistent with acute deep vein thrombosis involving the left posterior tibial vein. No cystic structure found in the popliteal fossa.  *See table(s) above for measurements and observations.    Preliminary    2D Echocardiogram  1. The left ventricle has normal systolic function, with an ejection fraction of 60-65%. The cavity size was normal. There is moderately increased left ventricular wall thickness. Left ventricular diastolic Doppler parameters are consistent with  impaired relaxation.  2. The right ventricle has normal systolc function. The cavity was normal.  3. The aortic valve is tricuspid Mild calcification of the aortic valve. No stenosis of the aortic valve.  4. The inferior vena cava was dilated in size with >50% respiratory variability.  5. Technically difficult; definity used; normal LV systolic function; moderate LVH; probable mild diastolic dysfunction; calcified aortic valve with reduced excursion of left coronary cusp but no AS by doppler.   PHYSICAL EXAM   Temp:  [97.8 F (36.6 C)-100.1 F (37.8 C)] 97.8 F (36.6 C) (05/07 1151) Pulse Rate:  [26-79] 72 (05/07 1151) Resp:  [12-16] 16 (05/07 1151) BP: (106-156)/(51-78) 106/78 (05/07 1151) SpO2:  [87 %-98 %] 98 % (05/07 1151)  General - Well nourished, well developed, in mild respiratory distress on [place]  Ophthalmologic - fundi not visualized due to noncooperation.  Cardiovascular - Regular rate and rhythm.  Mental Status -  Level of arousal and orientation to time, place, and person were intact. Language including expression, naming, repetition, comprehension was assessed and found intact.  Slight dysarthria  Cranial Nerves II - XII - II - Visual field intact OU. III, IV, VI - Extraocular movements intact. V - Facial sensation intact bilaterally. VII - Facial movement intact bilaterally. VIII - Hearing & vestibular intact bilaterally. X - Palate elevates symmetrically. XI - Chin turning & shoulder shrug intact  bilaterally. XII - Tongue protrusion intact.  Motor Strength - The patient's strength was normal in all extremities and pronator drift was absent.  Bulk was normal and fasciculations were absent.   Motor Tone - Muscle tone was assessed at the neck and appendages and was normal.  Reflexes - The patient's reflexes were symmetrical in all extremities and he had no pathological reflexes.  Sensory - Light touch, temperature/pinprick were assessed and were symmetrical.    Coordination - The patient had normal movements in the left hand and foot with no ataxia or dysmetria.  However, left subtle finger-to-nose and heel-to-shin slight ataxia.  Tremor was absent.  Gait and Station - deferred.   ASSESSMENT/PLAN Travis [name] is a 68 y.o. male with history of PE 2 mos ago on Eliquis, B squamous cell cancer of the lungs, CAD s/p PCA, COPD, DB, HTN, HLD, CKD stage 2, PVD and tobacco abuse presenting with dizziness, nausea and vomiting, trouble saying words.   Stroke:   R SCA territory cerebellar infarct even on aspirin and Eliquis, likely embolic pattern, secondary to unknown source.   CT head R cerebellar infarct. Small vessel disease. Atrophy.   MRI  R superolateral cerebellar infarct. Mod Small vessel disease.   MRA  Unremarkable   Consider CTA neck if cre down trending in am  Carotid Doppler  B ICA 1-39% stenosis, VAs antegrade (March 2020)  2D Echo EF 60-65%. No source of embolus   LE venous Doppler L posterior tibial acute DVT  TCD bubble study negative for PFO  LDL 96  HgbA1c 8.1  HIV neg  Lovenox 30 mg sq daily for VTE prophylaxis  On diet  aspirin 81 mg daily and Eliquis (apixaban) daily prior to admission, now on aspirin 81 mg daily and Eliquis (apixaban) daily. Continue at d.c  Therapy recommendations:  CIR  Disposition:  pending   DVT on Eliquis  LE venous Doppler L posterior tibial acute DVT  TCD bubble study no PFO  Continue aspirin 81 and  eliquis  Pharmacy adjusted eliquis dose for DVT treatment  History of PE  As per chart, he was dyspneic in December 2019 and hypoxic.   V/Q scan  at that time with possible left PE.   He was started on Eliquis.  Lung nodules s/p radiation in 2019  Multiple lung nodules follow with [facility]  S/p radiation   Patient stated that he had a 3 biopsy in 3 different locations, but all were negative for cancer  CT C/T/P no concern of malignancy, but with contrast B LL nodules reduced w/ surrounding airspace opacity. Complete occlusion RUL bronchus w/ most of the RUL collapsed w/ sm amt airspace filling. Airway thickening R lung w/ narrowing of the bronchus intermedius. New 56mm LUL pulm nodule with new small ground glass density nodule LUL. mildly enlarged paratracheal and subcarinal LNs.   AKI on CKD stage 2, improving  Creatinine 2.27-2.05-1.60-1.39-1.51  Encourage p.o. intake  BMP monitoring  Consider CTA neck if cre down trending in am  Mild respiratory distress  History of smoking and COPD as always lung nodule/cancer  Off NSR, on [place]  COVID neg  Off Levaquin  Treatment per primary team  Hypertension  Stable  On home metoprolol, Norvasc, Lasix and Lotensin . Permissive hypertension (OK if <180/105) but gradually normalize in 2-3 days . Long-term BP goal normotensive  Diabetes type II Uncontrolled  HgbA1c 8.1, goal < 7.0  SSI  CBG monitoring  Close PCP follow-up  Tobacco abuse  Current smoker  Smoking cessation counseling provided  Pt is willing to quit  Other Stroke Risk Factors  Advanced age  Hx smokeless tobacco use  Coronary artery disease MI s/p stenting and PCI  PVD  Other Active Problems    Hospital day # 3   Rosalin Hawking, MD PhD Stroke Neurology 03/18/2019 2:02 PM    To contact Stroke Continuity provider, please refer to [email or web address] After hours, contact General Neurology

## 2019-03-18 NOTE — Progress Notes (Signed)
Physical Therapy Treatment Patient Details Name: Travis [name] MRN: [id number] DOB: Apr 03, 1951 Today's Date: 03/18/2019    History of Present Illness Travis [name] is an 68 y.o. male who presents to the ED today with symptoms of dizziness with N/V since early this AM at 0100.  Of note, O2 saturations in the ED were 89-92%, so he was started on 2L [place]. 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 CT head revealed a subacute right cerebellar hemisphere ischemic infarction. Pt also found to have LLE DVT on 5/6; per notes, keeping anticoagulation meds the same for now.     PT Comments    Pt progressing towards goals. Continues to present with ataxia and unsteadiness, especially when turning and performing backwards walking. Required mod A for steadying assist. Worked on stepping towards targets and worked on slowed controlled steps during gait, especially with RLE. Current recommendations appropriate. Will continue to follow acutely to maximize functional mobility independence and safety.    Follow Up Recommendations  CIR     Equipment Recommendations  None recommended by PT    Recommendations for Other Services Rehab consult     Precautions / Restrictions Precautions Precautions: Fall Restrictions Weight Bearing Restrictions: No    Mobility  Bed Mobility Overal bed mobility: Needs Assistance Bed Mobility: Supine to Sit;Sit to Supine     Supine to sit: Supervision Sit to supine: Supervision   General bed mobility comments: Supervision for safety.   Transfers Overall transfer level: Needs assistance Equipment used: Rolling walker (2 wheeled) Transfers: Sit to/from Stand Sit to Stand: Min assist         General transfer comment: Min A for steadying assist to stand with RW. Heavy reliance on UEs once standing.   Ambulation/Gait Ambulation/Gait assistance: Mod assist Gait Distance (Feet): 20  Feet Assistive device: Rolling walker (2 wheeled) Gait Pattern/deviations: Step-to pattern;Step-through pattern;Ataxic;Staggering left;Staggering right Gait velocity: Decreased    General Gait Details: Worked on multiple bouts of forward and backward walking and focused on controlled steps with RLE. Cues for heel to toe gait pattern and looking for target on floor when taking steps to help with control. Pt remains VERY unsteady, especially during backwards walking.    Stairs             Wheelchair Mobility    Modified Rankin (Stroke Patients Only) Modified Rankin (Stroke Patients Only) Pre-Morbid Rankin Score: No symptoms Modified Rankin: Moderately severe disability     Balance Overall balance assessment: Needs assistance Sitting-balance support: Feet unsupported;No upper extremity supported Sitting balance-Leahy Scale: Fair     Standing balance support: Bilateral upper extremity supported Standing balance-Leahy Scale: Poor Standing balance comment: Reliant on external and UE support                             Cognition Arousal/Alertness: Awake/alert Behavior During Therapy: WFL for tasks assessed/performed Overall Cognitive Status: Within Functional Limits for tasks assessed                                        Exercises Other Exercises Other Exercises: Worked on standing and stepping towards targets on floor to work on control of ataxic movements. Performed stepping X 10 on LLE and RLE. Continue to note ataxia more in RLE.  General Comments        Pertinent Vitals/Pain Pain Assessment: No/denies pain    Home Living                      Prior Function            PT Goals (current goals can now be found in the care plan section) Acute Rehab PT Goals Patient Stated Goal: I want to be able to walk again and go home when I can PT Goal Formulation: With patient Time For Goal Achievement: 03/30/19 Potential to  Achieve Goals: Good Progress towards PT goals: Progressing toward goals    Frequency    Min 4X/week      PT Plan Current plan remains appropriate    Co-evaluation              AM-PAC PT "6 Clicks" Mobility   Outcome Measure  Help needed turning from your back to your side while in a flat bed without using bedrails?: A Little Help needed moving from lying on your back to sitting on the side of a flat bed without using bedrails?: A Little Help needed moving to and from a bed to a chair (including a wheelchair)?: A Lot Help needed standing up from a chair using your arms (e.g., wheelchair or bedside chair)?: A Little Help needed to walk in hospital room?: A Lot Help needed climbing 3-5 steps with a railing? : Total 6 Click Score: 14    End of Session Equipment Utilized During Treatment: Gait belt Activity Tolerance: Patient tolerated treatment well Patient left: in bed;with call bell/phone within reach;with bed alarm set Nurse Communication: Mobility status PT Visit Diagnosis: Unsteadiness on feet (R26.81);Other abnormalities of gait and mobility (R26.89);Difficulty in walking, not elsewhere classified (R26.2);Ataxic gait (R26.0)     Time: 1535-1550 PT Time Calculation (min) (ACUTE ONLY): 15 min  Charges:  $Gait Training: 8-22 mins                     Travis [name], PT, DPT  Acute Rehabilitation Services  Pager: [phone] Office: [phone]    [name] 03/18/2019, 4:15 PM

## 2019-03-18 NOTE — Progress Notes (Signed)
TCD has been completed.   Preliminary results in CV Proc.   Abram Sander 03/18/2019 2:21 PM

## 2019-03-18 NOTE — PMR Pre-admission (Signed)
PMR Admission Coordinator Pre-Admission Assessment  Patient: Travis [name] is an 68 y.o., male MRN: [id number] DOB: 03/19/1951 Height: 6' (182.9 cm) Weight: 95.3 kg  Insurance Information HMO:     PPO:      PCP:      IPA:     80/20:      OTHER: no HMO PRIMARY: Medicare a and b      Policy#: [id number]      Subscriber: pt Benefits:  Phone #: passport one online     Name: 5/5 Eff. Date: a 04/12/2015 b 01/10/2015     Deduct: $1408      Out of Pocket Max: none      Life Max: none CIR: 100%      SNF: 20 full days Outpatient: 80%     Co-Pay: 20% Home Health: 100%      Co-Pay: none DME: 80%     Co-Pay: 20% Providers: pt choice  SECONDARY: [employer]      Policy#: [id number]      Subscriber: pt  Medicaid Application Date:       Case Manager:  Disability Application Date:       Case Worker:   The "Data Collection Information Summary" for patients in Inpatient Rehabilitation Facilities with attached "Privacy Act [place] Records" was provided and verbally reviewed with: Patient  Emergency Contact Information Contact Information    Name Relation Home Work Mobile   Travis [name],Travis [name] Spouse [phone]  [phone]      Current Medical History  Patient Admitting Diagnosis: right cerebellar CVA  History of Present Illness:   68 year old male with history of DM2, HLD, COPD, CAD, 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 Presented 5/4 and CT revealed a sub acute right cerebellar hemisphere ischemic infarction.   Neurology consulted. CT C/A/P no malignancy. TCD bubble study at bedside no PFO. Continue eliquis and ASA. Creatinine elevated so CTA neck to rule out dissection of Vertebral artery on hold to be done as outpatient.  Carotid dopplers B ICA stenosis 1 to 39% stenosis, VAs antegrade. 2 d echo EF 60 to 65 % no source of embolus. LE venous doppler left posterior tibial acute DVT. LDL 96, Hgb A1c 8.1/ lovenox 30 mg sq daily for VTE  prophylaxis. Asa 81 mg daily and Eliquis daily pta, to continue at d/c. LE venous dopplers left posterior tibial acute DVT.   On home metoprolol, Norvasc, Lasix and Lotensin. To allow permissive HTN but gradually normalizing.    Complete NIHSS TOTAL: 1  Patient[facility] has been reviewed by the rehabilitation admission coordinator and physician.  Past Medical History  Past Medical History:  Diagnosis Date  . CAD (coronary artery disease)   . Cancer ([place])    skin cancer face  . COPD (chronic obstructive pulmonary disease) ([place])   . Coronary artery disease    PCA 1994l; PCI august 2003 of an obtust marginal. Catheterization at the time reveal: left main normal; LAD 50-75% between first and second diagonals, 50% after lesion; left circumflex 95% in obtuse marg. RCA 100% proximal.  . Diabetes mellitus    type 2  . Hyperlipemia   . Hypertension   . Kidney disease    chronic; stage 2 followed By Dr Deterding  . Myocardial infarction ([place])   . Osteoarthritis   . Peripheral vascular disease ([place])   . Tobacco abuse     Family History   family history  includes Cancer in his father; Coronary artery disease in his mother; Diabetes in his brother; Heart attack in his brother; Heart disease in his brother and mother; Heart failure in an other family member; Hypertension in his brother and sister; Peripheral vascular disease in his brother; Pulmonary embolism in an other family member; Stroke in an other family member.  Prior Rehab/Hospitalizations Has the patient had prior rehab or hospitalizations prior to admission? Yes  Has the patient had major surgery during 100 days prior to admission? No   Current Medications  Current Facility-Administered Medications:  .  acetaminophen (TYLENOL) tablet 650 mg, 650 mg, Oral, Q4H PRN **OR** acetaminophen (TYLENOL) solution 650 mg, 650 mg, Per Tube, Q4H PRN **OR** acetaminophen (TYLENOL) suppository 650 mg, 650 mg,  Rectal, Q4H PRN, Marylyn Ishihara, Tyrone A, DO .  allopurinol (ZYLOPRIM) tablet 100 mg, 100 mg, Oral, QHS, Kyle, Tyrone A, DO, 100 mg at 03/18/19 2138 .  amLODipine (NORVASC) tablet 10 mg, 10 mg, Oral, Daily, Kyle, Tyrone A, DO, 10 mg at 03/19/19 1040 .  apixaban (ELIQUIS) tablet 10 mg, 10 mg, Oral, BID, 10 mg at 03/19/19 1040 **FOLLOWED BY** [START ON 03/25/2019] apixaban (ELIQUIS) tablet 5 mg, 5 mg, Oral, BID, Lavina Hamman, MD .  aspirin EC tablet 81 mg, 81 mg, Oral, Daily, Kyle, Tyrone A, DO, 81 mg at 03/19/19 1040 .  benazepril (LOTENSIN) tablet 40 mg, 40 mg, Oral, Q2200, Kyle, Tyrone A, DO, 40 mg at 03/18/19 2138 .  benzonatate (TESSALON) capsule 100 mg, 100 mg, Oral, TID PRN, Marylyn Ishihara, Tyrone A, DO, 100 mg at 03/18/19 2138 .  doxycycline (VIBRA-TABS) tablet 100 mg, 100 mg, Oral, Q12H, Lavina Hamman, MD, 100 mg at 03/19/19 1040 .  guaiFENesin (MUCINEX) 12 hr tablet 600 mg, 600 mg, Oral, BID, Lavina Hamman, MD, 600 mg at 03/19/19 1040 .  insulin aspart (novoLOG) injection 0-15 Units, 0-15 Units, Subcutaneous, TID WC, Kyle, Tyrone A, DO, 2 Units at 03/19/19 [phone] .  insulin aspart (novoLOG) injection 0-5 Units, 0-5 Units, Subcutaneous, QHS, Kyle, Tyrone A, DO, 2 Units at 03/18/19 2139 .  ipratropium-albuterol (DUONEB) 0.5-2.5 (3) MG/3ML nebulizer solution 3 mL, 3 mL, Nebulization, TID, Kyle, Tyrone A, DO, 3 mL at 03/19/19 0830 .  metoprolol tartrate (LOPRESSOR) tablet 100 mg, 100 mg, Oral, BID, Kyle, Tyrone A, DO, 100 mg at 03/19/19 1039 .  mirtazapine (REMERON) tablet 15 mg, 15 mg, Oral, QHS, Kyle, Tyrone A, DO, 15 mg at 03/18/19 2138 .  promethazine-codeine (PHENERGAN with CODEINE) 6.25-10 MG/5ML syrup 5 mL, 5 mL, Oral, Q6H PRN, Marylyn Ishihara, Tyrone A, DO .  senna-docusate (Senokot-S) tablet 1 tablet, 1 tablet, Oral, QHS PRN, Marylyn Ishihara, Tyrone A, DO  Patients Current Diet:  Diet Order            Diet Heart Room service appropriate? Yes; Fluid consistency: Thin  Diet effective now              Precautions /  Restrictions Precautions Precautions: Fall Precaution Comments: subacute right cerebellar hemisphere ischemic infarction Restrictions Weight Bearing Restrictions: No   Has the patient had 2 or more falls or a fall with injury in the past year? No  Prior Activity Level Community (5-7x/wk): Independent and driving; retired  Prior Functional Level Self Care: Did the patient need help bathing, dressing, using the toilet or eating? Independent  Indoor Mobility: Did the patient need assistance with walking from room to room (with or without device)? Independent  Stairs: Did the patient need assistance with internal or  external stairs (with or without device)? Independent  Functional Cognition: Did the patient need help planning regular tasks such as shopping or remembering to take medications? Independent  Home Assistive Devices / Equipment Home Assistive Devices/Equipment: None Home Equipment: None  Prior Device Use: Indicate devices/aids used by the patient prior to current illness, exacerbation or injury? None of the above  Current Functional Level Cognition  Arousal/Alertness: Awake/alert Overall Cognitive Status: Within Functional Limits for tasks assessed Orientation Level: Oriented X4 General Comments: AxOx4 motivated and eager "get better" Attention: Focused, Sustained Focused Attention: Appears intact(Vigilance WNL: 1/1) Sustained Attention: Appears intact(Serial 7s: 3/3) Memory: Impaired Memory Impairment: Retrieval deficit, Decreased recall of new information(Immediate: 4/5; Delayed: 2/5; with cues: 3/3 ) Awareness: Appears intact Problem Solving: Appears intact(5/5) Executive Function: Reasoning, Sequencing, Organizing Reasoning: Appears intact(2/2) Sequencing: Appears intact(Clock drawing: 3/3) Organizing: Appears intact(Backward digit span: 1/1)    Extremity Assessment (includes Sensation/Coordination)  Upper Extremity Assessment: Defer to OT evaluation RUE  Deficits / Details: RUE with ataxic movement, greater than LUE. AROM and strength WFL's RUE Sensation: WNL RUE Coordination: decreased fine motor LUE Deficits / Details: mild ataxia with tremor, AROM and strength WFL's LUE Sensation: WNL LUE Coordination: decreased fine motor  Lower Extremity Assessment: RLE deficits/detail, LLE deficits/detail RLE Deficits / Details: Ataxia noted during gait (RLE>LLE). Strength WFL  RLE Coordination: decreased gross motor, decreased fine motor LLE Deficits / Details: Ataxia noted during gait (RLE>LLE). Strength WFL  LLE Coordination: decreased fine motor, decreased gross motor    ADLs  Overall ADL's : Needs assistance/impaired Eating/Feeding: Minimal assistance Eating/Feeding Details (indicate cue type and reason): to cut, open containers and with items such as soup Grooming: Wash/dry hands, Wash/dry face, Oral care, Applying deodorant, Set up Upper Body Bathing: Minimal assistance, Sitting Lower Body Bathing: Maximal assistance, Sit to/from stand Lower Body Bathing Details (indicate cue type and reason): pt requires signficant assistance for dynamic standing balance Upper Body Dressing : Minimal assistance Lower Body Dressing: Maximal assistance Toilet Transfer: Moderate assistance, +2 for physical assistance Toilet Transfer Details (indicate cue type and reason): mod a x2 due to signficant ataxia RLE greater than LLE.  Pt with several LOB during basic mobility.  Hand held assist x2 Toileting- Clothing Manipulation and Hygiene: Moderate assistance Functional mobility during ADLs: +2 for physical assistance, Moderate assistance General ADL Comments: mod a x2 hand held (pt would not be able to control walker at this time)    Mobility  Overal bed mobility: Needs Assistance Bed Mobility: Supine to Sit, Sit to Supine Supine to sit: Supervision Sit to supine: Supervision General bed mobility comments: Supervision for safety.     Transfers  Overall  transfer level: Needs assistance Equipment used: Rolling walker (2 wheeled) Transfers: Sit to/from Stand Sit to Stand: Min assist Stand pivot transfers: Mod assist General transfer comment: Min A for steadying assist to stand with RW. Heavy reliance on UEs once standing.     Ambulation / Gait / Stairs / Wheelchair Mobility  Ambulation/Gait Ambulation/Gait assistance: Mod assist Gait Distance (Feet): 20 Feet Assistive device: Rolling walker (2 wheeled) Gait Pattern/deviations: Step-to pattern, Step-through pattern, Ataxic, Staggering left, Staggering right General Gait Details: Worked on multiple bouts of forward and backward walking and focused on controlled steps with RLE. Cues for heel to toe gait pattern and looking for target on floor when taking steps to help with control. Pt remains VERY unsteady, especially during backwards walking.  Gait velocity: Decreased     Posture / Balance Balance Overall balance assessment:  Needs assistance Sitting-balance support: Feet unsupported, No upper extremity supported Sitting balance-Leahy Scale: Fair Standing balance support: Bilateral upper extremity supported Standing balance-Leahy Scale: Poor Standing balance comment: Reliant on external and UE support     Special needs/care consideration BiPAP/CPAP  N/a CPM  N/a Continuous Drip IV  N/a Dialysis  N/a Life Vest  N/a Oxygen   At 2 liters [place]; not on home O2 pta Special Bed  N/a Trach Size  N/a Wound Vac n/a Skin intact                  Bowel mgmt:  Continent LBM  5/6 Bladder mgmt:  continent Diabetic mgmt: Hgb A1c 8.1 Behavioral consideration  N/a Chemo/radiation yes in the past for lung nodules   Previous Home Environment  Living Arrangements: Spouse/significant other  Lives With: Spouse Available Help at Discharge: Available 24 hours/day Type of Home: House Home Layout: One level Home Access: Stairs to enter Entrance Stairs-Rails: Right, Left, Can reach both Entrance  Stairs-Number of Steps: 3 in the front, 1 in the back.  railing on both sides in back Bathroom Shower/Tub: Multimedia programmer: Standard Bathroom Accessibility: Yes Home Care Services: No  Discharge Living Setting Plans for Discharge Living Setting: Patient's home, Lives with (comment)(wife) Type of Home at Discharge: House Discharge Home Layout: One level Discharge Home Access: Stairs to enter Entrance Stairs-Rails: Right, Left, Can reach both Entrance Stairs-Number of Steps: 3 in front; one in back Discharge Bathroom Shower/Tub: Walk-in shower Discharge Bathroom Toilet: Standard Discharge Bathroom Accessibility: Yes How Accessible: Accessible via walker Does the patient have any problems obtaining your medications?: No  Social/Family/Support Systems Patient Roles: Spouse Contact Information: wife, Travis [name] Anticipated Caregiver: wife Anticipated Caregiver's Contact Information: see above Caregiver Availability: 24/7 Discharge Plan Discussed with Primary Caregiver: Yes Is Caregiver In Agreement with Plan?: Yes Does Caregiver/Family have Issues with Lodging/Transportation while Pt is in Rehab?: No  Goals/Additional Needs Patient/Family Goal for Rehab: Mod I to superivsion PT, OT, and SLP Expected length of stay: ELOS 10 to 12 days Pt/Family Agrees to Admission and willing to participate: Yes Program Orientation Provided & Reviewed with Pt/Caregiver Including Roles  & Responsibilities: Yes  Decrease burden of Care through IP rehab admission: n/a  Possible need for SNF placement upon discharge:  Not anticipated  Patient Condition: I have reviewed medical records from [facility] , spoken with CSW, and patient and spouse. I met with patient at the bedside for inpatient rehabilitation assessment.  Patient will benefit from ongoing PT, OT and SLP, can actively participate in 3 hours of therapy a day 5 days of the week, and can make measurable gains during the  admission.  Patient will also benefit from the coordinated team approach during an Inpatient Acute Rehabilitation admission.  The patient will receive intensive therapy as well as Rehabilitation physician, nursing, social worker, and care management interventions.  Due to bladder management, bowel management, safety, skin/wound care, disease management, medication administration and patient education the patient requires 24 hour a day rehabilitation nursing.  The patient is currently mod asssist with mobility and basic ADLs.  Discharge setting and therapy post discharge at home with home health is anticipated.  Patient has agreed to participate in the Acute Inpatient Rehabilitation Program and will admit today.  Preadmission Screen Completed By:  Cleatrice Burke RN MSN 03/19/2019 12:25 PM ______________________________________________________________________   Discussed status with Dr. Naaman Plummer on 03/19/2019 at  1225 and received approval for admission today.  Admission Coordinator:  Cleatrice Burke, RN MSN time 1225 Date  03/19/2019   Assessment/Plan: Diagnosis: right cerebellar infarct 1. Does the need for close, 24 hr/day Medical supervision in concert with the patient's rehab needs make it unreasonable for this patient to be served in a less intensive setting? Yes 2. Co-Morbidities requiring supervision/potential complications: dvt, htn, dmII 3. Due to bladder management, bowel management, safety, skin/wound care, disease management, medication administration, pain management and patient education, does the patient require 24 hr/day rehab nursing? Yes 4. Does the patient require coordinated care of a physician, rehab nurse, PT (1-2 hrs/day, 5 days/week), OT (1-2 hrs/day, 5 days/week) and SLP (1-2 hrs/day, 5 days/week) to address physical and functional deficits in the context of the above medical diagnosis(es)? Yes Addressing deficits in the following areas: balance, endurance, locomotion,  strength, transferring, bowel/bladder control, bathing, dressing, feeding, grooming, toileting, speech and psychosocial support 5. Can the patient actively participate in an intensive therapy program of at least 3 hrs of therapy 5 days a week? Yes 6. The potential for patient to make measurable gains while on inpatient rehab is excellent 7. Anticipated functional outcomes upon discharge from inpatients are: modified independent and supervision PT, modified independent and supervision OT, modified independent and supervision SLP 8. Estimated rehab length of stay to reach the above functional goals is: 9-12 days 9. Anticipated D/C setting: Home 10. Anticipated post D/C treatments: [place] therapy 11. Overall Rehab/Functional Prognosis: excellent  MD Signature: Meredith Staggers, MD, [facility] 03/19/2019

## 2019-03-18 NOTE — Progress Notes (Signed)
Travis [name] Kitchen  PROGRESS NOTE    Travis [name]  GYI:[id number] DOB: 1951-07-12 DOA: 03/15/2019 PCP: [name], MD   Brief Narrative:   Travis [name] is a 68 y.o. male with medical history significant of DM2, HLD, COPD, CAD; presents with nausea, vomiting, and dizziness. He notes that at about 0100hrs on 03/15/2019 he felt acutely nauseous. As he tried to get up, he became dizzy and began to vomit. He had several episodes of emesis that were accompanied by uncontrolled coughing. The vomitus was dark, but no blood noted. He felt off balance and his right arm became weak. He also notes that he had problems with expressing his words that started around 2200hrs 03/14/2019. He denies a syncopal episode and also notes that no one in the house was awake to witness this event. He became concerned about his symptoms can came to [facility] ED. Pt evaled by ED staff. Noted to have acute CVA on imaging.    Assessment & Plan:   Principal Problem:   CVA (cerebral vascular accident) ([place]) Active Problems:   HYPERCHOLESTEROLEMIA   Essential hypertension, benign   CORONARY ATHEROSCLEROSIS NATIVE CORONARY ARTERY   PVD   CKD (chronic kidney disease) stage 3, GFR 30-59 ml/min (HCC)   Nausea & vomiting   Acute right SCA territory cerebellar infarct     - neurology onboard     - CTH:  Acute to subacute RIGHT cerebellar infarct. No evidence of hemorrhage.     - MRI/MRA brain: IMPRESSION: 3 cm region of acute infarction in the right superolateral cerebellum. No hemorrhage or mass effect.      - A1c 8.1     - Tchol: 90, HDL: 25, LDL: 46      - Consult PT/OT/SLP, CIR recommended     -Prior to admission patient was already on 81 mg aspirin and Eliquis.  After discharge we will continue the same. Transcranial Doppler currently pending.  CTA neck recommended by neurology if creatinine is improving.  Will hold Lasix for the same.  HTN     - amlodipine 10mg  qday, benazepril 40mg  qday, Lasix is on hold  HLD     - lipitor 40mg   CAD    - ASA, statin, BB     - eliquis  PVD     - see above  CKD stage 3     - watch nephrotoxins; monitor     - Scr 1.6 this AM  N/V     - related to CVA     - antiemetics PRN, fluids     - resolved  DM2     - takes toujeo at home and humalog     - SSI     - A1c 8.1  Complete occlusion of the right upper lobe bronchus,?  Radiation injury Incentive spirometry flutter.  Currently not hypoxic. Recommend outpatient follow-up with pulmonary.  acute deep vein thrombosis involving the left posterior tibial vein On apixaban,?  Apixaban failure May need to transition to lovenox but patient currently refusing to transition from Eliquis. Recommend outpatient follow-up with medical oncology.  Acute kidney injury on chronic kidney disease stage II. Renal function improving. Currently holding Lasix. Neurology recommending CTA neck if the creatinine is improving.   DVT prophylaxis: SCDs, eliquis Code Status: FULL Family Communication: Update to wife at 78 hrs. Disposition Plan: To CIR?   Consultants:   Neurology  Procedures:   None  Antimicrobials:   None    Subjective: No acute complaint no nausea no  vomiting no fever no chills no chest pain.  Objective: Vitals:   03/18/19 1039 03/18/19 1151 03/18/19 1420 03/18/19 1533  BP:  106/78  (!) 124/58  Pulse: (!) 26 72  74  Resp:  16  16  Temp:  97.8 F (36.6 C)  97.9 F (36.6 C)  TempSrc:  Oral  Oral  SpO2:  98% 98% 99%  Weight:      Height:        Intake/Output Summary (Last 24 hours) at 03/18/2019 1925 Last data filed at 03/18/2019 6256 Gross per 24 hour  Intake 240 ml  Output 280 ml  Net -40 ml   Filed Weights   03/15/19 0955  Weight: 95.3 kg    Examination:  General: 68 y.o. male resting in bed in NAD Cardiovascular: RRR, +S1, S2, no m/g/r, equal pulses throughout Respiratory: CTABL, no w/r/r, normal WOB GI: BS+, NDNT, no masses noted, no organomegaly noted MSK: No e/c/c Skin: No rashes,  bruises, ulcerations noted Neuro: A&O x 3, no focal deficits Psyc: Appropriate interaction and affect, calm/cooperative    Data Reviewed: I have personally reviewed following labs and imaging studies.  CBC: Recent Labs  Lab 03/15/19 1036 03/16/19 0617 03/17/19 0426 03/18/19 0432  WBC 9.6 10.2 8.8 8.6  NEUTROABS 8.6*  --  7.1  --   HGB 14.1 12.4* 13.2 13.5  HCT 45.6 40.4 42.3 43.3  MCV 86.5 86.7 86.0 85.2  PLT 277 261 235 389   Basic Metabolic Panel: Recent Labs  Lab 03/15/19 1036 03/16/19 0617 03/17/19 0426 03/18/19 0432  NA 139 144 142 140  K 4.9 4.9 4.7 4.8  CL 106 111 108 106  CO2 20* 24 26 24   GLUCOSE 318* 164* 180* 191*  BUN 53* 42* 33* 32*  CREATININE 2.05* 1.60* 1.39* 1.51*  CALCIUM 9.0 8.6* 8.7* 8.8*  MG  --   --  2.0 1.9  PHOS  --   --  3.0  --    GFR: Estimated Creatinine Clearance: 55.3 mL/min (A) (by C-G formula based on SCr of 1.51 mg/dL (H)). Liver Function Tests: Recent Labs  Lab 03/15/19 1036 03/16/19 0617 03/17/19 0426  AST 25 17  --   ALT 24 17  --   ALKPHOS 57 48  --   BILITOT 0.5 0.5  --   PROT 7.1 5.6*  --   ALBUMIN 2.7* 2.2* 2.3*   Recent Labs  Lab 03/15/19 1036  LIPASE 30   No results for input(s): AMMONIA in the last 168 hours. Coagulation Profile: Recent Labs  Lab 03/15/19 1036  INR 1.5*   Cardiac Enzymes: Recent Labs  Lab 03/15/19 1036  TROPONINI <0.03   BNP (last 3 results) No results for input(s): PROBNP in the last 8760 hours. HbA1C: Recent Labs    03/16/19 0617  HGBA1C 8.1*   CBG: Recent Labs  Lab 03/17/19 1619 03/17/19 2057 03/18/19 0607 03/18/19 1149 03/18/19 1605  GLUCAP 158* 191* 167* 212* 150*   Lipid Profile: Recent Labs    03/16/19 0617  CHOL 90  HDL 25*  LDLCALC 46  TRIG 95  CHOLHDL 3.6   Thyroid Function Tests: No results for input(s): TSH, T4TOTAL, FREET4, T3FREE, THYROIDAB in the last 72 hours. Anemia Panel: No results for input(s): VITAMINB12, FOLATE, FERRITIN, TIBC, IRON,  RETICCTPCT in the last 72 hours. Sepsis Labs: No results for input(s): PROCALCITON, LATICACIDVEN in the last 168 hours.  Recent Results (from the past 240 hour(s))  SARS Coronavirus 2 (CEPHEID- Performed in [facility] hospital lab), Hosp Order     Status: None   Collection Time: 03/15/19 10:46 AM  Result Value Ref Range Status   SARS Coronavirus 2 NEGATIVE NEGATIVE Final    Comment: (NOTE) If result is NEGATIVE SARS-CoV-2 target nucleic acids are NOT DETECTED. The SARS-CoV-2 RNA is generally detectable in upper and lower  respiratory specimens during the acute phase of infection. The lowest  concentration of SARS-CoV-2 viral copies this assay can detect is 250  copies / mL. A negative result does not preclude SARS-CoV-2 infection  and should not be used as the sole basis for treatment or other  patient management decisions.  A negative result may occur with  improper specimen collection / handling, submission of specimen other  than nasopharyngeal swab, presence of viral mutation(s) within the  areas targeted by this assay, and inadequate number of viral copies  (<250 copies / mL). A negative result must be combined with clinical  observations, patient history, and epidemiological information. If result is POSITIVE SARS-CoV-2 target nucleic acids are DETECTED. The SARS-CoV-2 RNA is generally detectable in upper and lower  respiratory specimens dur ing the acute phase of infection.  Positive  results are indicative of active infection with SARS-CoV-2.  Clinical  correlation with patient history and other diagnostic information is  necessary to determine patient infection status.  Positive results do  not rule out bacterial infection or co-infection with other viruses. If result is PRESUMPTIVE POSTIVE SARS-CoV-2 nucleic acids MAY BE PRESENT.   A presumptive positive result was obtained on the submitted specimen  and confirmed on repeat testing.  While 2019 novel coronavirus    (SARS-CoV-2) nucleic acids may be present in the submitted sample  additional confirmatory testing may be necessary for epidemiological  and / or clinical management purposes  to differentiate between  SARS-CoV-2 and other [place]ovirus currently known to infect humans.  If clinically indicated additional testing with an alternate test  methodology [phone]) is advised. The SARS-CoV-2 RNA is generally  detectable in upper and lower respiratory sp ecimens during the acute  phase of infection. The expected result is Negative. Fact Sheet for Patients:  StrictlyIdeas.no Fact Sheet for Healthcare Providers: BankingDealers.co.za This test is not yet approved or cleared by the [place] FDA and has been authorized for detection and/or diagnosis of SARS-CoV-2 by FDA under an Emergency Use Authorization (EUA).  This EUA will remain in effect (meaning this test can be used) for the duration of the COVID-19 declaration under Section 564(b)(1) of the Act, 21 U.S.C. section 360bbb-3(b)(1), unless the authorization is terminated or revoked sooner. Performed at [facility], [place] [address] [place], [place] [address]          Radiology Studies: Ct Chest W Contrast  Result Date: 03/17/2019 CLINICAL DATA:  Squamous cell carcinoma the lungs. Right cerebellar stroke. EXAM: CT CHEST, ABDOMEN, AND PELVIS WITH CONTRAST TECHNIQUE: Multidetector CT imaging of the chest, abdomen and pelvis was performed following the standard protocol during bolus administration of intravenous contrast. CONTRAST:  36mL OMNIPAQUE IOHEXOL 300 MG/ML  SOLN COMPARISON:  Chest CT from 06/04/2018 and PET-CT from 08/15/2017 FINDINGS: CT CHEST FINDINGS Cardiovascular: Coronary, aortic arch, and branch vessel atherosclerotic vascular disease. Mild cardiomegaly. Interventricular septal thickening suggesting left ventricular hypertrophy. Mediastinum/Nodes: Right paratracheal node  1.0 cm in short axis on image 19/3, formerly 0.6 cm. Additional small right paratracheal lymph nodes are below 1 cm in diameter but mildly increased from prior exam. Subcarinal node 1.1 cm in short axis  on image 30/3, formerly 0.5 cm. Bilateral gynecomastia. Lungs/Pleura: Small right pleural effusion. There is complete occlusion of the right upper lobe bronchus. There is atelectasis and possibly some mild alveolar filling airspace opacity posteriorly in the right upper lobe, with aerated anterior portion of the right upper lobe. Airway thickening along the bronchus intermedius resulting in some luminal narrowing, with airway thickening centrally in the right middle lobe and right lower lobe. Superior segment right lower lobe airspace opacity on image 67/5 could be from prior radiation therapy though infection is not excluded. Fiducials are noted in the superior segment right lower lobe. Further inferiorly in the vicinity of the prior pulmonary nodule, there is indistinct patchy opacity which could be from radiation pneumonitis or pneumonia, for example as shown on image 93/5. The original nodule is not readily apparent against this background density. There is some associated surrounding ground-glass opacity. There is some faint nodularity peripherally in the left upper lobe. On image 62/5, a triangular 0.4 by 0.3 cm nodule is new or significantly increased from prior. Indistinct ground-glass density nodule in the left upper lobe measuring 0.9 cm in diameter on image 78/5 is new. Subsegmental atelectasis is present anteriorly in left lower lobe. Small fiducials are present anteriorly in the left lower lobe and there are bandlike opacities along the left hemidiaphragm with crowding of bronchovascular structures suggesting some degree of volume loss. The lobulated nodule along the inferior pulmonary ligament is somewhat obscured by surrounding atelectasis, for example on image 114/5, but is less sharply defined and  possibly less prominent. There is some additional nodularity in this vicinity, for example posteriorly on image 112/5 where there is a 1.7 by 1.3 cm ill-defined nodular density, probably inflammatory. Musculoskeletal: Thoracic spondylosis. CT ABDOMEN PELVIS FINDINGS Hepatobiliary: Cholecystectomy. The common bile duct measures 1.1 cm in diameter, not changed from 08/15/2017, probably a physiologic response to cholecystectomy. No focal liver lesion is identified. Pancreas: Unremarkable Spleen: Unremarkable Adrenals/Urinary Tract: 1.7 by 1.8 cm left adrenal mass is unchanged in size from 08/15/2017 and was not previously hypermetabolic, hence likely benign. Small hypodense lesions of the left mid kidney right kidney upper pole probably cysts but technically too small to characterize. Considerable vascular calcifications along the renal hila bilaterally. No definite urinary tract calculi. Stomach/Bowel: Unremarkable Vascular/Lymphatic: Aortoiliac atherosclerotic vascular disease. No pathologic adenopathy identified. Reproductive: Faint calcification in the right posterior prostate gland. Other: No supplemental non-categorized findings. Musculoskeletal: Incidental lipoma in the right vastus lateralis muscle. Degenerative disc disease at L4-5. IMPRESSION: 1. Both the right lower lobe pulmonary nodule on the left lower lobe pulmonary nodule are reduced in conspicuity with some degree of surrounding airspace opacity, possibly radiation pneumonitis if the patient has had interval radiation therapy. 2. Complete occlusion of the right upper lobe bronchus. Most of the right upper lobe is collapsed potentially with a small amount of airspace filling process; there is air trapping in portions of the right upper lobe anteriorly. 3. Airway thickening centrally in the right lung, resulting in some luminal narrowing of the bronchus intermedius. 4. There is a new 4 mm triangular-shaped left upper lobe pulmonary nodule along with a  new small ground-glass density nodule in the left upper lobe which merit surveillance. 5. Mildly enlarged paratracheal and subcarinal lymph nodes likewise merits surveillance although could be reactive. 6. Other imaging findings of potential clinical significance: Aortic Atherosclerosis (ICD10-I70.0). Coronary atherosclerosis. Mild cardiomegaly with suggestion of left ventricular hypertrophy. Small right pleural effusion. Chronically stable small left adrenal mass, not previously hypermetabolic on  PET-CT and hence likely benign. Electronically Signed   By: Van Clines M.D.   On: 03/17/2019 15:11   Ct Abdomen Pelvis W Contrast  Result Date: 03/17/2019 CLINICAL DATA:  Squamous cell carcinoma the lungs. Right cerebellar stroke. EXAM: CT CHEST, ABDOMEN, AND PELVIS WITH CONTRAST TECHNIQUE: Multidetector CT imaging of the chest, abdomen and pelvis was performed following the standard protocol during bolus administration of intravenous contrast. CONTRAST:  34mL OMNIPAQUE IOHEXOL 300 MG/ML  SOLN COMPARISON:  Chest CT from 06/04/2018 and PET-CT from 08/15/2017 FINDINGS: CT CHEST FINDINGS Cardiovascular: Coronary, aortic arch, and branch vessel atherosclerotic vascular disease. Mild cardiomegaly. Interventricular septal thickening suggesting left ventricular hypertrophy. Mediastinum/Nodes: Right paratracheal node 1.0 cm in short axis on image 19/3, formerly 0.6 cm. Additional small right paratracheal lymph nodes are below 1 cm in diameter but mildly increased from prior exam. Subcarinal node 1.1 cm in short axis on image 30/3, formerly 0.5 cm. Bilateral gynecomastia. Lungs/Pleura: Small right pleural effusion. There is complete occlusion of the right upper lobe bronchus. There is atelectasis and possibly some mild alveolar filling airspace opacity posteriorly in the right upper lobe, with aerated anterior portion of the right upper lobe. Airway thickening along the bronchus intermedius resulting in some luminal  narrowing, with airway thickening centrally in the right middle lobe and right lower lobe. Superior segment right lower lobe airspace opacity on image 67/5 could be from prior radiation therapy though infection is not excluded. Fiducials are noted in the superior segment right lower lobe. Further inferiorly in the vicinity of the prior pulmonary nodule, there is indistinct patchy opacity which could be from radiation pneumonitis or pneumonia, for example as shown on image 93/5. The original nodule is not readily apparent against this background density. There is some associated surrounding ground-glass opacity. There is some faint nodularity peripherally in the left upper lobe. On image 62/5, a triangular 0.4 by 0.3 cm nodule is new or significantly increased from prior. Indistinct ground-glass density nodule in the left upper lobe measuring 0.9 cm in diameter on image 78/5 is new. Subsegmental atelectasis is present anteriorly in left lower lobe. Small fiducials are present anteriorly in the left lower lobe and there are bandlike opacities along the left hemidiaphragm with crowding of bronchovascular structures suggesting some degree of volume loss. The lobulated nodule along the inferior pulmonary ligament is somewhat obscured by surrounding atelectasis, for example on image 114/5, but is less sharply defined and possibly less prominent. There is some additional nodularity in this vicinity, for example posteriorly on image 112/5 where there is a 1.7 by 1.3 cm ill-defined nodular density, probably inflammatory. Musculoskeletal: Thoracic spondylosis. CT ABDOMEN PELVIS FINDINGS Hepatobiliary: Cholecystectomy. The common bile duct measures 1.1 cm in diameter, not changed from 08/15/2017, probably a physiologic response to cholecystectomy. No focal liver lesion is identified. Pancreas: Unremarkable Spleen: Unremarkable Adrenals/Urinary Tract: 1.7 by 1.8 cm left adrenal mass is unchanged in size from 08/15/2017 and was  not previously hypermetabolic, hence likely benign. Small hypodense lesions of the left mid kidney right kidney upper pole probably cysts but technically too small to characterize. Considerable vascular calcifications along the renal hila bilaterally. No definite urinary tract calculi. Stomach/Bowel: Unremarkable Vascular/Lymphatic: Aortoiliac atherosclerotic vascular disease. No pathologic adenopathy identified. Reproductive: Faint calcification in the right posterior prostate gland. Other: No supplemental non-categorized findings. Musculoskeletal: Incidental lipoma in the right vastus lateralis muscle. Degenerative disc disease at L4-5. IMPRESSION: 1. Both the right lower lobe pulmonary nodule on the left lower lobe pulmonary nodule are reduced in  conspicuity with some degree of surrounding airspace opacity, possibly radiation pneumonitis if the patient has had interval radiation therapy. 2. Complete occlusion of the right upper lobe bronchus. Most of the right upper lobe is collapsed potentially with a small amount of airspace filling process; there is air trapping in portions of the right upper lobe anteriorly. 3. Airway thickening centrally in the right lung, resulting in some luminal narrowing of the bronchus intermedius. 4. There is a new 4 mm triangular-shaped left upper lobe pulmonary nodule along with a new small ground-glass density nodule in the left upper lobe which merit surveillance. 5. Mildly enlarged paratracheal and subcarinal lymph nodes likewise merits surveillance although could be reactive. 6. Other imaging findings of potential clinical significance: Aortic Atherosclerosis (ICD10-I70.0). Coronary atherosclerosis. Mild cardiomegaly with suggestion of left ventricular hypertrophy. Small right pleural effusion. Chronically stable small left adrenal mass, not previously hypermetabolic on PET-CT and hence likely benign. Electronically Signed   By: Van Clines M.D.   On: 03/17/2019 15:11    Vas [place] Transcranial Doppler W Bubbles  Result Date: 03/18/2019  Transcranial Doppler with Bubble Indications: Stroke. Performing Technologist: Abram Sander RVS  Examination Guidelines: A complete evaluation includes B-mode imaging, spectral Doppler, color Doppler, and power Doppler as needed of all accessible portions of each vessel. Bilateral testing is considered an integral part of a complete examination. Limited examinations for reoccurring indications may be performed as noted.  Summary:  A vascular evaluation was performed. The left middle cerebral artery was studied. An IV was inserted into the patient's right forearm. Verbal informed consent was obtained.  Interpretation: HITS heard at rest: None HITS heard during valsalva: None *See table(s) above for measurements and observations.    Preliminary    Vas [place] Lower Extremity Venous (dvt)  Result Date: 03/18/2019  Lower Venous Study Indications: Stroke.  Performing Technologist: Maudry Mayhew MHA, RDMS, RVT, RDCS  Examination Guidelines: A complete evaluation includes B-mode imaging, spectral Doppler, color Doppler, and power Doppler as needed of all accessible portions of each vessel. Bilateral testing is considered an integral part of a complete examination. Limited examinations for reoccurring indications may be performed as noted.  +---------+---------------+---------+-----------+----------+-------+  RIGHT     Compressibility Phasicity Spontaneity Properties Summary  +---------+---------------+---------+-----------+----------+-------+  CFV       Full            Yes       Yes                             +---------+---------------+---------+-----------+----------+-------+  SFJ       Full                                                      +---------+---------------+---------+-----------+----------+-------+  FV Prox   Full                                                      +---------+---------------+---------+-----------+----------+-------+  FV  Mid    Full                                                      +---------+---------------+---------+-----------+----------+-------+  FV Distal Full                                                      +---------+---------------+---------+-----------+----------+-------+  PFV       Full                                                      +---------+---------------+---------+-----------+----------+-------+  POP       Full            Yes       Yes                             +---------+---------------+---------+-----------+----------+-------+  PTV       Full                                                      +---------+---------------+---------+-----------+----------+-------+  PERO      Full                                                      +---------+---------------+---------+-----------+----------+-------+   +---------+---------------+---------+-----------+----------+-------+  LEFT      Compressibility Phasicity Spontaneity Properties Summary  +---------+---------------+---------+-----------+----------+-------+  CFV       Full            Yes       Yes                             +---------+---------------+---------+-----------+----------+-------+  SFJ       Full                                                      +---------+---------------+---------+-----------+----------+-------+  FV Prox   Full                                                      +---------+---------------+---------+-----------+----------+-------+  FV Mid    Full                                                      +---------+---------------+---------+-----------+----------+-------+  FV Distal Full                                                      +---------+---------------+---------+-----------+----------+-------+  PFV       Full                                                      +---------+---------------+---------+-----------+----------+-------+  POP       Full            Yes       Yes                              +---------+---------------+---------+-----------+----------+-------+  PTV       None                      No                     Acute    +---------+---------------+---------+-----------+----------+-------+  PERO      Full                                                      +---------+---------------+---------+-----------+----------+-------+     Summary: Right: There is no evidence of deep vein thrombosis in the lower extremity. No cystic structure found in the popliteal fossa. Left: Findings consistent with acute deep vein thrombosis involving the left posterior tibial vein. No cystic structure found in the popliteal fossa.  *See table(s) above for measurements and observations. Electronically signed by Servando Snare MD on 03/18/2019 at 4:34:38 PM.    Final         Scheduled Meds:  allopurinol  100 mg Oral QHS   amLODipine  10 mg Oral Daily   apixaban  10 mg Oral BID   Followed by   Derrill Memo ON 03/25/2019] apixaban  5 mg Oral BID   aspirin EC  81 mg Oral Daily   benazepril  40 mg Oral Q2200   doxycycline  100 mg Oral Q12H   guaiFENesin  600 mg Oral BID   insulin aspart  0-15 Units Subcutaneous TID WC   insulin aspart  0-5 Units Subcutaneous QHS   ipratropium-albuterol  3 mL Nebulization TID   metoprolol tartrate  100 mg Oral BID   mirtazapine  15 mg Oral QHS   Continuous Infusions:   LOS: 3 days    Time spent: 25 minutes spent in the coordination of care today.    Author:  Berle Mull, MD [facility] 03/18/2019   To reach On-call, see care teams to locate the attending and reach out to them via [email or web address]. If 7PM-7AM, please contact night-coverage If you still have difficulty reaching the attending provider, please page the [facility] (Director on Call) for [facility] on amion for assistance.

## 2019-03-18 NOTE — Progress Notes (Signed)
  Speech Language Pathology Treatment: Cognitive-Linquistic(dysarthria)  Patient Details Name: Travis [name] MRN: [id number] DOB: 1951/04/20 Today's Date: 03/18/2019 Time: 1644-     Assessment / Plan / Recommendation Clinical Impression  Pt was alert and cooperative throughout the session without complaint of pain. He reported that he has been using the compensatory strategies for speech intelligibility and that individuals appear to understand him better without need for clarification. He was able t recall 2/3 strategies without cues. He completed lingual ROM and strengthening exercises with min. cues. He used compensatory strategies at the 5-7 word sentence level with 80% accuracy increasing to 100% accuracy with min. cues. At the 8-9 word sentence level he demonstrated 70% accuracy increasing to 100% accuracy with mod cues for overarticulation. SLP will continue to follow.    HPI HPI: Pt  is a 68 y.o. male with medical history significant of DM2, HLD, COPD, CAD who presented with nausea, vomiting, difficulty expressing his words, and dizziness. MRI of the brain revealed 3 cm region of acute infarction in the right superolateral cerebellum.      SLP Plan  Continue with current plan of care       Recommendations                   Follow up Recommendations: Inpatient Rehab SLP Visit Diagnosis: Dysarthria and anarthria (R47.1) Plan: Continue with current plan of care       Travis [name] I. Hardin Negus, [place], [place] Office number [phone] Pager [phone]                 Travis [name] 03/18/2019, 5:05 PM

## 2019-03-18 NOTE — Progress Notes (Signed)
Inpatient Rehabilitation Admissions Coordinator  I await medical work up completion. Met with patient at bedside and left a message for his wife to call me per pt's request to review rehab plans.  Danne Baxter, RN, MSN Rehab Admissions Coordinator [phone] 03/18/2019 1:07 PM

## 2019-03-18 NOTE — Discharge Instructions (Signed)
Information on my medicine - ELIQUIS (apixaban)  This medication education was reviewed with me or my healthcare representative as part of my discharge preparation.    Why was Eliquis prescribed for you? Eliquis was prescribed to treat blood clots that may have been found in the veins of your legs (deep vein thrombosis) or in your lungs (pulmonary embolism) and to reduce the risk of them occurring again.  What do You need to know about Eliquis ? The starting dose is 10 mg (two 5 mg tablets) taken TWICE daily for the FIRST SEVEN (7) DAYS, then on (enter date)  03/25/19  the dose is reduced to ONE 5 mg tablet taken TWICE daily.  Eliquis may be taken with or without food.   Try to take the dose about the same time in the morning and in the evening. If you have difficulty swallowing the tablet whole please discuss with your pharmacist how to take the medication safely.  Take Eliquis exactly as prescribed and DO NOT stop taking Eliquis without talking to the doctor who prescribed the medication.  Stopping may increase your risk of developing a new blood clot.  Refill your prescription before you run out.  After discharge, you should have regular check-up appointments with your healthcare provider that is prescribing your Eliquis.    What do you do if you miss a dose? If a dose of ELIQUIS is not taken at the scheduled time, take it as soon as possible on the same day and twice-daily administration should be resumed. The dose should not be doubled to make up for a missed dose.  Important Safety Information A possible side effect of Eliquis is bleeding. You should call your healthcare provider right away if you experience any of the following: ? Bleeding from an injury or your nose that does not stop. ? Unusual colored urine (red or dark brown) or unusual colored stools (red or black). ? Unusual bruising for unknown reasons. ? A serious fall or if you hit your head (even if there is no  bleeding).  Some medicines may interact with Eliquis and might increase your risk of bleeding or clotting while on Eliquis. To help avoid this, consult your healthcare provider or pharmacist prior to using any new prescription or non-prescription medications, including herbals, vitamins, non-steroidal anti-inflammatory drugs (NSAIDs) and supplements.  This website has more information on Eliquis (apixaban): [email or web address]://[email or web address].[email or web address]

## 2019-03-19 ENCOUNTER — Encounter (HOSPITAL_COMMUNITY): Payer: Self-pay | Admitting: *Deleted

## 2019-03-19 ENCOUNTER — Inpatient Hospital Stay (HOSPITAL_COMMUNITY)
Admission: RE | Admit: 2019-03-19 | Discharge: 2019-03-27 | DRG: 057 | Disposition: A | Discharge status: Home Health | Payer: Medicare Other | Source: Intra-hospital | Attending: Physical Medicine & Rehabilitation | Admitting: Physical Medicine & Rehabilitation

## 2019-03-19 ENCOUNTER — Other Ambulatory Visit: Payer: Self-pay | Admitting: Neurology

## 2019-03-19 DIAGNOSIS — Z85118 Personal history of other malignant neoplasm of bronchus and lung: Secondary | ICD-10-CM | POA: Diagnosis not present

## 2019-03-19 DIAGNOSIS — E1151 Type 2 diabetes mellitus with diabetic peripheral angiopathy without gangrene: Secondary | ICD-10-CM | POA: Diagnosis present

## 2019-03-19 DIAGNOSIS — E119 Type 2 diabetes mellitus without complications: Secondary | ICD-10-CM | POA: Diagnosis not present

## 2019-03-19 DIAGNOSIS — Z86711 Personal history of pulmonary embolism: Secondary | ICD-10-CM

## 2019-03-19 DIAGNOSIS — Z86718 Personal history of other venous thrombosis and embolism: Secondary | ICD-10-CM | POA: Diagnosis not present

## 2019-03-19 DIAGNOSIS — M1990 Unspecified osteoarthritis, unspecified site: Secondary | ICD-10-CM | POA: Diagnosis present

## 2019-03-19 DIAGNOSIS — N183 Chronic kidney disease, stage 3 unspecified: Secondary | ICD-10-CM | POA: Diagnosis present

## 2019-03-19 DIAGNOSIS — J9819 Other pulmonary collapse: Secondary | ICD-10-CM | POA: Diagnosis present

## 2019-03-19 DIAGNOSIS — J189 Pneumonia, unspecified organism: Secondary | ICD-10-CM

## 2019-03-19 DIAGNOSIS — Z87891 Personal history of nicotine dependence: Secondary | ICD-10-CM

## 2019-03-19 DIAGNOSIS — I10 Essential (primary) hypertension: Secondary | ICD-10-CM

## 2019-03-19 DIAGNOSIS — I639 Cerebral infarction, unspecified: Secondary | ICD-10-CM | POA: Diagnosis present

## 2019-03-19 DIAGNOSIS — Z833 Family history of diabetes mellitus: Secondary | ICD-10-CM | POA: Diagnosis not present

## 2019-03-19 DIAGNOSIS — R0902 Hypoxemia: Secondary | ICD-10-CM | POA: Diagnosis present

## 2019-03-19 DIAGNOSIS — C3492 Malignant neoplasm of unspecified part of left bronchus or lung: Secondary | ICD-10-CM | POA: Diagnosis present

## 2019-03-19 DIAGNOSIS — Z9049 Acquired absence of other specified parts of digestive tract: Secondary | ICD-10-CM | POA: Diagnosis not present

## 2019-03-19 DIAGNOSIS — K920 Hematemesis: Secondary | ICD-10-CM | POA: Diagnosis not present

## 2019-03-19 DIAGNOSIS — J449 Chronic obstructive pulmonary disease, unspecified: Secondary | ICD-10-CM | POA: Diagnosis present

## 2019-03-19 DIAGNOSIS — C3490 Malignant neoplasm of unspecified part of unspecified bronchus or lung: Secondary | ICD-10-CM | POA: Diagnosis not present

## 2019-03-19 DIAGNOSIS — I2510 Atherosclerotic heart disease of native coronary artery without angina pectoris: Secondary | ICD-10-CM | POA: Diagnosis present

## 2019-03-19 DIAGNOSIS — R042 Hemoptysis: Secondary | ICD-10-CM | POA: Diagnosis not present

## 2019-03-19 DIAGNOSIS — I82442 Acute embolism and thrombosis of left tibial vein: Secondary | ICD-10-CM | POA: Diagnosis present

## 2019-03-19 DIAGNOSIS — E1122 Type 2 diabetes mellitus with diabetic chronic kidney disease: Secondary | ICD-10-CM | POA: Diagnosis present

## 2019-03-19 DIAGNOSIS — Z79899 Other long term (current) drug therapy: Secondary | ICD-10-CM

## 2019-03-19 DIAGNOSIS — I69392 Facial weakness following cerebral infarction: Secondary | ICD-10-CM | POA: Diagnosis not present

## 2019-03-19 DIAGNOSIS — Z7982 Long term (current) use of aspirin: Secondary | ICD-10-CM

## 2019-03-19 DIAGNOSIS — R918 Other nonspecific abnormal finding of lung field: Secondary | ICD-10-CM | POA: Diagnosis present

## 2019-03-19 DIAGNOSIS — J9811 Atelectasis: Secondary | ICD-10-CM | POA: Diagnosis not present

## 2019-03-19 DIAGNOSIS — C3491 Malignant neoplasm of unspecified part of right bronchus or lung: Secondary | ICD-10-CM | POA: Diagnosis present

## 2019-03-19 DIAGNOSIS — I69393 Ataxia following cerebral infarction: Principal | ICD-10-CM

## 2019-03-19 DIAGNOSIS — I69328 Other speech and language deficits following cerebral infarction: Secondary | ICD-10-CM | POA: Diagnosis not present

## 2019-03-19 DIAGNOSIS — I129 Hypertensive chronic kidney disease with stage 1 through stage 4 chronic kidney disease, or unspecified chronic kidney disease: Secondary | ICD-10-CM | POA: Diagnosis present

## 2019-03-19 DIAGNOSIS — I252 Old myocardial infarction: Secondary | ICD-10-CM | POA: Diagnosis not present

## 2019-03-19 DIAGNOSIS — Z823 Family history of stroke: Secondary | ICD-10-CM

## 2019-03-19 DIAGNOSIS — I69322 Dysarthria following cerebral infarction: Secondary | ICD-10-CM | POA: Diagnosis not present

## 2019-03-19 DIAGNOSIS — Z955 Presence of coronary angioplasty implant and graft: Secondary | ICD-10-CM | POA: Diagnosis not present

## 2019-03-19 DIAGNOSIS — Z8249 Family history of ischemic heart disease and other diseases of the circulatory system: Secondary | ICD-10-CM | POA: Diagnosis not present

## 2019-03-19 DIAGNOSIS — I824Z9 Acute embolism and thrombosis of unspecified deep veins of unspecified distal lower extremity: Secondary | ICD-10-CM

## 2019-03-19 DIAGNOSIS — I69351 Hemiplegia and hemiparesis following cerebral infarction affecting right dominant side: Secondary | ICD-10-CM | POA: Diagnosis not present

## 2019-03-19 DIAGNOSIS — Z923 Personal history of irradiation: Secondary | ICD-10-CM

## 2019-03-19 DIAGNOSIS — E278 Other specified disorders of adrenal gland: Secondary | ICD-10-CM | POA: Diagnosis not present

## 2019-03-19 DIAGNOSIS — Z85828 Personal history of other malignant neoplasm of skin: Secondary | ICD-10-CM | POA: Diagnosis not present

## 2019-03-19 DIAGNOSIS — Z01810 Encounter for preprocedural cardiovascular examination: Secondary | ICD-10-CM | POA: Diagnosis not present

## 2019-03-19 DIAGNOSIS — I63441 Cerebral infarction due to embolism of right cerebellar artery: Secondary | ICD-10-CM

## 2019-03-19 DIAGNOSIS — Y95 Nosocomial condition: Secondary | ICD-10-CM

## 2019-03-19 DIAGNOSIS — Z7901 Long term (current) use of anticoagulants: Secondary | ICD-10-CM

## 2019-03-19 DIAGNOSIS — J90 Pleural effusion, not elsewhere classified: Secondary | ICD-10-CM | POA: Diagnosis not present

## 2019-03-19 DIAGNOSIS — Z880 Allergy status to penicillin: Secondary | ICD-10-CM

## 2019-03-19 DIAGNOSIS — E785 Hyperlipidemia, unspecified: Secondary | ICD-10-CM | POA: Diagnosis present

## 2019-03-19 LAB — CBC
HCT: 40.1 % (ref 39.0–52.0)
Hemoglobin: 12.3 g/dL — ABNORMAL LOW (ref 13.0–17.0)
MCH: 26.2 pg (ref 26.0–34.0)
MCHC: 30.7 g/dL (ref 30.0–36.0)
MCV: 85.5 fL (ref 80.0–100.0)
Platelets: 218 10*3/uL (ref 150–400)
RBC: 4.69 MIL/uL (ref 4.22–5.81)
RDW: 14.1 % (ref 11.5–15.5)
WBC: 8.6 10*3/uL (ref 4.0–10.5)
nRBC: 0 % (ref 0.0–0.2)

## 2019-03-19 LAB — BASIC METABOLIC PANEL
Anion gap: 9 (ref 5–15)
BUN: 39 mg/dL — ABNORMAL HIGH (ref 8–23)
CO2: 26 mmol/L (ref 22–32)
Calcium: 8.7 mg/dL — ABNORMAL LOW (ref 8.9–10.3)
Chloride: 106 mmol/L (ref 98–111)
Creatinine, Ser: 1.64 mg/dL — ABNORMAL HIGH (ref 0.61–1.24)
GFR calc Af Amer: 49 mL/min — ABNORMAL LOW (ref >60)
GFR calc non Af Amer: 42 mL/min — ABNORMAL LOW (ref >60)
Glucose, Bld: 156 mg/dL — ABNORMAL HIGH (ref 70–99)
Potassium: 4.4 mmol/L (ref 3.5–5.1)
Sodium: 141 mmol/L (ref 135–145)

## 2019-03-19 LAB — GLUCOSE, CAPILLARY
Glucose-Capillary: 148 mg/dL — ABNORMAL HIGH (ref 70–99)
Glucose-Capillary: 156 mg/dL — ABNORMAL HIGH (ref 70–99)
Glucose-Capillary: 157 mg/dL — ABNORMAL HIGH (ref 70–99)
Glucose-Capillary: 146 mg/dL — ABNORMAL HIGH (ref 70–99)

## 2019-03-19 MED ORDER — MIRTAZAPINE 15 MG PO TABS
15.0000 mg | ORAL_TABLET | Freq: Every day | ORAL | Status: DC
  Administered 2019-03-19 – 2019-03-26 (×8): 15 mg via ORAL
  Filled 2019-03-19 (×8): qty 1

## 2019-03-19 MED ORDER — INSULIN ASPART 100 UNIT/ML ~~LOC~~ SOLN: 0.0000 [IU] | Freq: Three times a day (TID) | SUBCUTANEOUS | 11 refills | Status: DC

## 2019-03-19 MED ORDER — ALUM & MAG HYDROXIDE-SIMETH 200-200-20 MG/5ML PO SUSP: 30.0000 mL | ORAL | Status: DC | PRN

## 2019-03-19 MED ORDER — POLYETHYLENE GLYCOL 3350 17 G PO PACK: 17.0000 g | PACK | Freq: Every day | ORAL | Status: DC | PRN

## 2019-03-19 MED ORDER — BISACODYL 10 MG RE SUPP: 10.0000 mg | Freq: Every day | RECTAL | Status: DC | PRN

## 2019-03-19 MED ORDER — METOPROLOL TARTRATE 50 MG PO TABS
100.0000 mg | ORAL_TABLET | Freq: Two times a day (BID) | ORAL | Status: DC
  Administered 2019-03-19 – 2019-03-27 (×16): 100 mg via ORAL
  Filled 2019-03-19 (×17): qty 2

## 2019-03-19 MED ORDER — DOXYCYCLINE HYCLATE 100 MG PO TABS
100.0000 mg | ORAL_TABLET | Freq: Two times a day (BID) | ORAL | Status: AC
  Administered 2019-03-19 – 2019-03-26 (×14): 100 mg via ORAL
  Filled 2019-03-19 (×14): qty 1

## 2019-03-19 MED ORDER — INSULIN ASPART 100 UNIT/ML ~~LOC~~ SOLN
0.0000 [IU] | Freq: Three times a day (TID) | SUBCUTANEOUS | Status: DC
  Administered 2019-03-19: 17:00:00 2 [IU] via SUBCUTANEOUS
  Administered 2019-03-20 – 2019-03-21 (×4): 3 [IU] via SUBCUTANEOUS
  Administered 2019-03-21 – 2019-03-22 (×3): 2 [IU] via SUBCUTANEOUS
  Administered 2019-03-22 – 2019-03-23 (×3): 3 [IU] via SUBCUTANEOUS
  Administered 2019-03-23: 2 [IU] via SUBCUTANEOUS
  Administered 2019-03-24: 17:00:00 8 [IU] via SUBCUTANEOUS
  Administered 2019-03-24 (×2): 2 [IU] via SUBCUTANEOUS
  Administered 2019-03-25 – 2019-03-26 (×3): 3 [IU] via SUBCUTANEOUS
  Administered 2019-03-26: 17:00:00 2 [IU] via SUBCUTANEOUS

## 2019-03-19 MED ORDER — DOXYCYCLINE HYCLATE 100 MG PO TABS: 100.0000 mg | ORAL_TABLET | Freq: Two times a day (BID) | ORAL | 0 refills | Status: DC

## 2019-03-19 MED ORDER — PROCHLORPERAZINE MALEATE 5 MG PO TABS: 5.0000 mg | ORAL_TABLET | Freq: Four times a day (QID) | ORAL | Status: DC | PRN

## 2019-03-19 MED ORDER — APIXABAN 5 MG PO TABS
5.0000 mg | ORAL_TABLET | Freq: Two times a day (BID) | ORAL | Status: DC
  Administered 2019-03-25 – 2019-03-27 (×5): 5 mg via ORAL
  Filled 2019-03-19 (×5): qty 1

## 2019-03-19 MED ORDER — RIVAROXABAN (XARELTO) VTE STARTER PACK (15 & 20 MG): ORAL_TABLET | ORAL | 0 refills | Status: DC

## 2019-03-19 MED ORDER — BENAZEPRIL HCL 40 MG PO TABS
40.0000 mg | ORAL_TABLET | Freq: Every day | ORAL | Status: DC
  Administered 2019-03-19 – 2019-03-26 (×8): 40 mg via ORAL
  Filled 2019-03-19 (×8): qty 1

## 2019-03-19 MED ORDER — GUAIFENESIN ER 600 MG PO TB12: 600.0000 mg | ORAL_TABLET | Freq: Two times a day (BID) | ORAL | 0 refills | Status: AC

## 2019-03-19 MED ORDER — PROCHLORPERAZINE EDISYLATE 10 MG/2ML IJ SOLN: 5.0000 mg | Freq: Four times a day (QID) | INTRAMUSCULAR | Status: DC | PRN

## 2019-03-19 MED ORDER — APIXABAN 5 MG PO TABS
10.0000 mg | ORAL_TABLET | Freq: Two times a day (BID) | ORAL | Status: AC
  Administered 2019-03-19 – 2019-03-24 (×11): 10 mg via ORAL
  Filled 2019-03-19 (×12): qty 2

## 2019-03-19 MED ORDER — PROCHLORPERAZINE 25 MG RE SUPP: 12.5000 mg | Freq: Four times a day (QID) | RECTAL | Status: DC | PRN

## 2019-03-19 MED ORDER — ACETAMINOPHEN 325 MG PO TABS: 650.0000 mg | ORAL_TABLET | ORAL | Status: AC | PRN

## 2019-03-19 MED ORDER — CALCITRIOL 0.25 MCG PO CAPS
0.2500 ug | ORAL_CAPSULE | Freq: Every day | ORAL | Status: DC
  Administered 2019-03-20 – 2019-03-27 (×8): 0.25 ug via ORAL
  Filled 2019-03-19 (×8): qty 1

## 2019-03-19 MED ORDER — TRAZODONE HCL 50 MG PO TABS: 25.0000 mg | ORAL_TABLET | Freq: Every evening | ORAL | Status: DC | PRN

## 2019-03-19 MED ORDER — IPRATROPIUM-ALBUTEROL 0.5-2.5 (3) MG/3ML IN SOLN
3.0000 mL | Freq: Three times a day (TID) | RESPIRATORY_TRACT | Status: DC
  Administered 2019-03-19 – 2019-03-24 (×14): 3 mL via RESPIRATORY_TRACT
  Filled 2019-03-19 (×15): qty 3

## 2019-03-19 MED ORDER — ALLOPURINOL 100 MG PO TABS
100.0000 mg | ORAL_TABLET | Freq: Every day | ORAL | Status: DC
  Administered 2019-03-19 – 2019-03-26 (×8): 100 mg via ORAL
  Filled 2019-03-19 (×7): qty 1

## 2019-03-19 MED ORDER — BENZONATATE 100 MG PO CAPS: 100.0000 mg | ORAL_CAPSULE | Freq: Three times a day (TID) | ORAL | Status: DC | PRN

## 2019-03-19 MED ORDER — AMLODIPINE BESYLATE 10 MG PO TABS
10.0000 mg | ORAL_TABLET | Freq: Every day | ORAL | Status: DC
  Administered 2019-03-20 – 2019-03-27 (×8): 10 mg via ORAL
  Filled 2019-03-19 (×8): qty 1

## 2019-03-19 MED ORDER — ACETAMINOPHEN 325 MG PO TABS: 325.0000 mg | ORAL_TABLET | ORAL | Status: DC | PRN

## 2019-03-19 MED ORDER — DIPHENHYDRAMINE HCL 12.5 MG/5ML PO ELIX: 12.5000 mg | ORAL_SOLUTION | Freq: Four times a day (QID) | ORAL | Status: DC | PRN

## 2019-03-19 MED ORDER — GUAIFENESIN-DM 100-10 MG/5ML PO SYRP: 5.0000 mL | ORAL_SOLUTION | Freq: Four times a day (QID) | ORAL | Status: DC | PRN

## 2019-03-19 MED ORDER — INSULIN ASPART 100 UNIT/ML ~~LOC~~ SOLN
0.0000 [IU] | Freq: Every day | SUBCUTANEOUS | Status: DC
  Administered 2019-03-20: 2 [IU] via SUBCUTANEOUS

## 2019-03-19 MED ORDER — FLEET ENEMA 7-19 GM/118ML RE ENEM: 1.0000 | ENEMA | Freq: Once | RECTAL | Status: DC | PRN

## 2019-03-19 MED ORDER — ASPIRIN EC 81 MG PO TBEC
81.0000 mg | DELAYED_RELEASE_TABLET | Freq: Every day | ORAL | Status: DC
  Administered 2019-03-20 – 2019-03-27 (×8): 81 mg via ORAL
  Filled 2019-03-19 (×8): qty 1

## 2019-03-19 MED ORDER — GUAIFENESIN ER 600 MG PO TB12
600.0000 mg | ORAL_TABLET | Freq: Two times a day (BID) | ORAL | Status: DC
  Administered 2019-03-19 – 2019-03-22 (×6): 600 mg via ORAL
  Filled 2019-03-19 (×6): qty 1

## 2019-03-19 NOTE — Progress Notes (Signed)
Meredith Staggers, MD  Physician  Physical Medicine and Rehabilitation  PMR Pre-admission  Signed  Date of Service:  03/18/2019 4:30 PM       Related encounter: ED to Hosp-Admission (Discharged) from 03/15/2019 in [facility]      Signed         Show:Clear all '[x]' Manual'[x]' Template'[x]' Copied  Added by: '[x]' Cristina Gong, RN'[x]' Meredith Staggers, MD  '[]' Hover for details PMR Admission Coordinator Pre-Admission Assessment  Patient: Travis [name] is an 68 y.o., male MRN: [id number] DOB: [birth year] Height: 6' (182.9 cm) Weight: 95.3 kg  Insurance Information HMO:     PPO:      PCP:      IPA:     80/20:      OTHER: no HMO PRIMARY: Medicare a and b      Policy#: [id number]      Subscriber: pt Benefits:  Phone #: passport one online     Name: 5/5 Eff. Date: a 04/12/2015 b 01/10/2015     Deduct: $1408      Out of Pocket Max: none      Life Max: none CIR: 100%      SNF: 20 full days Outpatient: 80%     Co-Pay: 20% Home Health: 100%      Co-Pay: none DME: 80%     Co-Pay: 20% Providers: pt choice  SECONDARY: [employer]      Policy#: [id number]      Subscriber: pt  Medicaid Application Date:       Case Manager:  Disability Application Date:       Case Worker:   The Data Collection Information Summary for patients in Inpatient Rehabilitation Facilities with attached Privacy Act [place] Records was provided and verbally reviewed with: Patient  Emergency Contact Information         Contact Information    Name Relation Home Work Mobile   Yeung,Tina Spouse [phone]  [phone]      Current Medical History  Patient Admitting Diagnosis: right cerebellar CVA  History of Present Illness:   68 year old male with history of DM2, HLD, COPD, CAD, 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 Presented 5/4 and CT revealed a sub acute right cerebellar hemisphere ischemic infarction.    Neurology consulted. CT C/A/P no malignancy. TCD bubble study at bedside no PFO. Continue eliquis and ASA. Creatinine elevated so CTA neck to rule out dissection of Vertebral artery on hold to be done as outpatient.  Carotid dopplers B ICA stenosis 1 to 39% stenosis, VAs antegrade. 2 d echo EF 60 to 65 % no source of embolus. LE venous doppler left posterior tibial acute DVT. LDL 96, Hgb A1c 8.1/ lovenox 30 mg sq daily for VTE prophylaxis. Asa 81 mg daily and Eliquis daily pta, to continue at d/c. LE venous dopplers left posterior tibial acute DVT.   On home metoprolol, Norvasc, Lasix and Lotensin. To allow permissive HTN but gradually normalizing.    Complete NIHSS TOTAL: 1  Patient[facility] medical record [facility] has been reviewed by the rehabilitation admission coordinator and physician.  Past Medical History      Past Medical History:  Diagnosis Date   CAD (coronary artery disease)    Cancer ([place])    skin cancer face   COPD (chronic obstructive pulmonary disease) ([place])    Coronary artery disease    PCA 1994l; PCI august 2003 of an obtust marginal. Catheterization at  the time reveal: left main normal; LAD 50-75% between first and second diagonals, 50% after lesion; left circumflex 95% in obtuse marg. RCA 100% proximal.   Diabetes mellitus    type 2   Hyperlipemia    Hypertension    Kidney disease    chronic; stage 2 followed By Dr Deterding   Myocardial infarction [facility])    Osteoarthritis    Peripheral vascular disease ([place])    Tobacco abuse     Family History   family history includes Cancer in his father; Coronary artery disease in his mother; Diabetes in his brother; Heart attack in his brother; Heart disease in his brother and mother; Heart failure in an other family member; Hypertension in his brother and sister; Peripheral vascular disease in his brother; Pulmonary embolism in an other family member; Stroke in an other  family member.  Prior Rehab/Hospitalizations Has the patient had prior rehab or hospitalizations prior to admission? Yes  Has the patient had major surgery during 100 days prior to admission? No              Current Medications  Current Facility-Administered Medications:    acetaminophen (TYLENOL) tablet 650 mg, 650 mg, Oral, Q4H PRN **OR** acetaminophen (TYLENOL) solution 650 mg, 650 mg, Per Tube, Q4H PRN **OR** acetaminophen (TYLENOL) suppository 650 mg, 650 mg, Rectal, Q4H PRN, Marylyn Ishihara, Tyrone A, DO   allopurinol (ZYLOPRIM) tablet 100 mg, 100 mg, Oral, QHS, Kyle, Tyrone A, DO, 100 mg at 03/18/19 2138   amLODipine (NORVASC) tablet 10 mg, 10 mg, Oral, Daily, Kyle, Tyrone A, DO, 10 mg at 03/19/19 1040   apixaban (ELIQUIS) tablet 10 mg, 10 mg, Oral, BID, 10 mg at 03/19/19 1040 **FOLLOWED BY** [START ON 03/25/2019] apixaban (ELIQUIS) tablet 5 mg, 5 mg, Oral, BID, Lavina Hamman, MD   aspirin EC tablet 81 mg, 81 mg, Oral, Daily, Kyle, Tyrone A, DO, 81 mg at 03/19/19 1040   benazepril (LOTENSIN) tablet 40 mg, 40 mg, Oral, Q2200, Kyle, Tyrone A, DO, 40 mg at 03/18/19 2138   benzonatate (TESSALON) capsule 100 mg, 100 mg, Oral, TID PRN, Marylyn Ishihara, Tyrone A, DO, 100 mg at 03/18/19 2138   doxycycline (VIBRA-TABS) tablet 100 mg, 100 mg, Oral, Q12H, Lavina Hamman, MD, 100 mg at 03/19/19 1040   guaiFENesin (MUCINEX) 12 hr tablet 600 mg, 600 mg, Oral, BID, Lavina Hamman, MD, 600 mg at 03/19/19 1040   insulin aspart (novoLOG) injection 0-15 Units, 0-15 Units, Subcutaneous, TID WC, Kyle, Tyrone A, DO, 2 Units at 03/19/19 9357   insulin aspart (novoLOG) injection 0-5 Units, 0-5 Units, Subcutaneous, QHS, Kyle, Tyrone A, DO, 2 Units at 03/18/19 2139   ipratropium-albuterol (DUONEB) 0.5-2.5 (3) MG/3ML nebulizer solution 3 mL, 3 mL, Nebulization, TID, Kyle, Tyrone A, DO, 3 mL at 03/19/19 0830   metoprolol tartrate (LOPRESSOR) tablet 100 mg, 100 mg, Oral, BID, Kyle, Tyrone A, DO, 100 mg at 03/19/19  1039   mirtazapine (REMERON) tablet 15 mg, 15 mg, Oral, QHS, Kyle, Tyrone A, DO, 15 mg at 03/18/19 2138   promethazine-codeine (PHENERGAN with CODEINE) 6.25-10 MG/5ML syrup 5 mL, 5 mL, Oral, Q6H PRN, Marylyn Ishihara, Tyrone A, DO   senna-docusate (Senokot-S) tablet 1 tablet, 1 tablet, Oral, QHS PRN, Marylyn Ishihara, Tyrone A, DO  Patients Current Diet:     Diet Order                  Diet Heart Room service appropriate? Yes; Fluid consistency: Thin  Diet effective now  Precautions / Restrictions Precautions Precautions: Fall Precaution Comments: subacute right cerebellar hemisphere ischemic infarction Restrictions Weight Bearing Restrictions: No   Has the patient had 2 or more falls or a fall with injury in the past year? No  Prior Activity Level Community (5-7x/wk): Independent and driving; retired  Prior Functional Level Self Care: Did the patient need help bathing, dressing, using the toilet or eating? Independent  Indoor Mobility: Did the patient need assistance with walking from room to room (with or without device)? Independent  Stairs: Did the patient need assistance with internal or external stairs (with or without device)? Independent  Functional Cognition: Did the patient need help planning regular tasks such as shopping or remembering to take medications? Independent  Home Assistive Devices / Equipment Home Assistive Devices/Equipment: None Home Equipment: None  Prior Device Use: Indicate devices/aids used by the patient prior to current illness, exacerbation or injury? None of the above  Current Functional Level Cognition  Arousal/Alertness: Awake/alert Overall Cognitive Status: Within Functional Limits for tasks assessed Orientation Level: Oriented X4 General Comments: AxOx4 motivated and eager "get better" Attention: Focused, Sustained Focused Attention: Appears intact(Vigilance WNL: 1/1) Sustained Attention: Appears intact(Serial 7s:  3/3) Memory: Impaired Memory Impairment: Retrieval deficit, Decreased recall of new information(Immediate: 4/5; Delayed: 2/5; with cues: 3/3 ) Awareness: Appears intact Problem Solving: Appears intact(5/5) Executive Function: Reasoning, Sequencing, Organizing Reasoning: Appears intact(2/2) Sequencing: Appears intact(Clock drawing: 3/3) Organizing: Appears intact(Backward digit span: 1/1)    Extremity Assessment (includes Sensation/Coordination)  Upper Extremity Assessment: Defer to OT evaluation RUE Deficits / Details: RUE with ataxic movement, greater than LUE. AROM and strength WFL's RUE Sensation: WNL RUE Coordination: decreased fine motor LUE Deficits / Details: mild ataxia with tremor, AROM and strength WFL's LUE Sensation: WNL LUE Coordination: decreased fine motor  Lower Extremity Assessment: RLE deficits/detail, LLE deficits/detail RLE Deficits / Details: Ataxia noted during gait (RLE>LLE). Strength WFL  RLE Coordination: decreased gross motor, decreased fine motor LLE Deficits / Details: Ataxia noted during gait (RLE>LLE). Strength WFL  LLE Coordination: decreased fine motor, decreased gross motor    ADLs  Overall ADL's : Needs assistance/impaired Eating/Feeding: Minimal assistance Eating/Feeding Details (indicate cue type and reason): to cut, open containers and with items such as soup Grooming: Wash/dry hands, Wash/dry face, Oral care, Applying deodorant, Set up Upper Body Bathing: Minimal assistance, Sitting Lower Body Bathing: Maximal assistance, Sit to/from stand Lower Body Bathing Details (indicate cue type and reason): pt requires signficant assistance for dynamic standing balance Upper Body Dressing : Minimal assistance Lower Body Dressing: Maximal assistance Toilet Transfer: Moderate assistance, +2 for physical assistance Toilet Transfer Details (indicate cue type and reason): mod a x2 due to signficant ataxia RLE greater than LLE.  Pt with several LOB during  basic mobility.  Hand held assist x2 Toileting- Clothing Manipulation and Hygiene: Moderate assistance Functional mobility during ADLs: +2 for physical assistance, Moderate assistance General ADL Comments: mod a x2 hand held (pt would not be able to control walker at this time)    Mobility  Overal bed mobility: Needs Assistance Bed Mobility: Supine to Sit, Sit to Supine Supine to sit: Supervision Sit to supine: Supervision General bed mobility comments: Supervision for safety.     Transfers  Overall transfer level: Needs assistance Equipment used: Rolling walker (2 wheeled) Transfers: Sit to/from Stand Sit to Stand: Min assist Stand pivot transfers: Mod assist General transfer comment: Min A for steadying assist to stand with RW. Heavy reliance on UEs once standing.     Ambulation /  Gait / Stairs / Wheelchair Mobility  Ambulation/Gait Ambulation/Gait assistance: Mod assist Gait Distance (Feet): 20 Feet Assistive device: Rolling walker (2 wheeled) Gait Pattern/deviations: Step-to pattern, Step-through pattern, Ataxic, Staggering left, Staggering right General Gait Details: Worked on multiple bouts of forward and backward walking and focused on controlled steps with RLE. Cues for heel to toe gait pattern and looking for target on floor when taking steps to help with control. Pt remains VERY unsteady, especially during backwards walking.  Gait velocity: Decreased     Posture / Balance Balance Overall balance assessment: Needs assistance Sitting-balance support: Feet unsupported, No upper extremity supported Sitting balance-Leahy Scale: Fair Standing balance support: Bilateral upper extremity supported Standing balance-Leahy Scale: Poor Standing balance comment: Reliant on external and UE support     Special needs/care consideration BiPAP/CPAP  N/a CPM  N/a Continuous Drip IV  N/a Dialysis  N/a Life Vest  N/a Oxygen   At 2 liters [place]; not on home O2 pta Special Bed   N/a Trach Size  N/a Wound Vac n/a Skin intact                  Bowel mgmt:  Continent LBM  5/6 Bladder mgmt:  continent Diabetic mgmt: Hgb A1c 8.1 Behavioral consideration  N/a Chemo/radiation yes in the past for lung nodules   Previous Home Environment  Living Arrangements: Spouse/significant other  Lives With: Spouse Available Help at Discharge: Available 24 hours/day Type of Home: House Home Layout: One level Home Access: Stairs to enter Entrance Stairs-Rails: Right, Left, Can reach both Entrance Stairs-Number of Steps: 3 in the front, 1 in the back.  railing on both sides in back Bathroom Shower/Tub: Multimedia programmer: Standard Bathroom Accessibility: Yes Home Care Services: No  Discharge Living Setting Plans for Discharge Living Setting: Patient's home, Lives with (comment)(wife) Type of Home at Discharge: House Discharge Home Layout: One level Discharge Home Access: Stairs to enter Entrance Stairs-Rails: Right, Left, Can reach both Entrance Stairs-Number of Steps: 3 in front; one in back Discharge Bathroom Shower/Tub: Walk-in shower Discharge Bathroom Toilet: Standard Discharge Bathroom Accessibility: Yes How Accessible: Accessible via walker Does the patient have any problems obtaining your medications?: No  Social/Family/Support Systems Patient Roles: Spouse Contact Information: wife, Otila Kluver Anticipated Caregiver: wife Anticipated Caregiver's Contact Information: see above Caregiver Availability: 24/7 Discharge Plan Discussed with Primary Caregiver: Yes Is Caregiver In Agreement with Plan?: Yes Does Caregiver/Family have Issues with Lodging/Transportation while Pt is in Rehab?: No  Goals/Additional Needs Patient/Family Goal for Rehab: Mod I to superivsion PT, OT, and SLP Expected length of stay: ELOS 10 to 12 days Pt/Family Agrees to Admission and willing to participate: Yes Program Orientation Provided & Reviewed with Pt/Caregiver Including  Roles  & Responsibilities: Yes  Decrease burden of Care through IP rehab admission: n/a  Possible need for SNF placement upon discharge:  Not anticipated  Patient Condition: I have reviewed medical records from [facility] , spoken with CSW, and patient and spouse. I met with patient at the bedside for inpatient rehabilitation assessment.  Patient will benefit from ongoing PT, OT and SLP, can actively participate in 3 hours of therapy a day 5 days of the week, and can make measurable gains during the admission.  Patient will also benefit from the coordinated team approach during an Inpatient Acute Rehabilitation admission.  The patient will receive intensive therapy as well as Rehabilitation physician, nursing, social worker, and care management interventions.  Due to bladder management, bowel management, safety, skin/wound  care, disease management, medication administration and patient education the patient requires 24 hour a day rehabilitation nursing.  The patient is currently mod asssist with mobility and basic ADLs.  Discharge setting and therapy post discharge at home with home health is anticipated.  Patient has agreed to participate in the Acute Inpatient Rehabilitation Program and will admit today.  Preadmission Screen Completed By:  Cleatrice Burke RN MSN 03/19/2019 12:25 PM ______________________________________________________________________   Discussed status with Dr. Naaman Plummer on 03/19/2019 at  1225 and received approval for admission today.  Admission Coordinator:  Cleatrice Burke, RN MSN time 1225 Date  03/19/2019   Assessment/Plan: Diagnosis: right cerebellar infarct 1. Does the need for close, 24 hr/day Medical supervision in concert with the patient's rehab needs make it unreasonable for this patient to be served in a less intensive setting? Yes 2. Co-Morbidities requiring supervision/potential complications: dvt, htn, dmII 3. Due to bladder management,  bowel management, safety, skin/wound care, disease management, medication administration, pain management and patient education, does the patient require 24 hr/day rehab nursing? Yes 4. Does the patient require coordinated care of a physician, rehab nurse, PT (1-2 hrs/day, 5 days/week), OT (1-2 hrs/day, 5 days/week) and SLP (1-2 hrs/day, 5 days/week) to address physical and functional deficits in the context of the above medical diagnosis(es)? Yes Addressing deficits in the following areas: balance, endurance, locomotion, strength, transferring, bowel/bladder control, bathing, dressing, feeding, grooming, toileting, speech and psychosocial support 5. Can the patient actively participate in an intensive therapy program of at least 3 hrs of therapy 5 days a week? Yes 6. The potential for patient to make measurable gains while on inpatient rehab is excellent 7. Anticipated functional outcomes upon discharge from inpatients are: modified independent and supervision PT, modified independent and supervision OT, modified independent and supervision SLP 8. Estimated rehab length of stay to reach the above functional goals is: 9-12 days 9. Anticipated D/C setting: Home 10. Anticipated post D/C treatments: [place] therapy 11. Overall Rehab/Functional Prognosis: excellent  MD Signature: Meredith Staggers, MD, [facility] 03/19/2019         Revision History

## 2019-03-19 NOTE — H&P (Signed)
Physical Medicine and Rehabilitation Admission H&P    Chief Complaint  Patient presents with   Stroke with functional decline.     HPI: Travis [name] is a 68 year old male with history of SCLC, CAD s/p PCA, T2DM, COPD, PE--on Eliquis, CKD III; who was admitted on 03/15/19 with nausea/vomiting that started night prior to admission, dizziness, speech changes and hypoxia. CT head revealed acute to subacute right cerebellar infarct without hemorrhage. MRI/MRA  brain done revealing 3cm acute infarct in right superolateral cerebellum and moderate white matter disease in pons and cerebral white matter. No large vessel occlusion seen. 2 D echo showed EF 60-65% with dilated IVC and no interatrial shunt. Patient reported that symptoms occurred after severe coughing episode and Dr. Erlinda Hong recommended CTA neck to rule out dissection of VA if SCr improves. Lasix held with improvement in SCr today.   BLE dopplers revealed acute DVT in left posterior tibial vein.  Patient on ASA/Eliquis prior to admission and Eliquis increased to 10 mg bid for 14 days followed by home dose. CT abdomen/pelvis done to rule out metastatic disease and revealed RLL and LLE nodules to be reduced with question of surrounding radiation pneumonitis, complete occlusion of RUL bronchus with potential collapse of most of the lobe with small amount of airspace filling process and new 4 mm LUL nodule and ground glass density as well as lymph node enlargement question reactive. He continues to have issues with hypoxia but respiratory status improving. Patient with resultant right hemiataxia, dysarthria and anarthria affecting functional status. CIR recommended for progressive therapy.    Review of Systems  Constitutional: Negative for chills and fever.  HENT: Negative for hearing loss and tinnitus.   Eyes: Negative for blurred vision and double vision.  Respiratory: Positive for cough (chronic/baseline) and sputum production (at baseline).     Cardiovascular: Negative for chest pain, palpitations and leg swelling.  Gastrointestinal: Negative for heartburn and nausea.  Genitourinary: Negative for dysuria and urgency.  Musculoskeletal: Negative for myalgias and neck pain.  Skin: Negative for itching and rash.  Neurological: Negative for dizziness, focal weakness and headaches.  Psychiatric/Behavioral: Negative for depression. The patient is not nervous/anxious and does not have insomnia.      Past Medical History:  Diagnosis Date   CAD (coronary artery disease)    Cancer ([place])    skin cancer face   COPD (chronic obstructive pulmonary disease) ([place])    Coronary artery disease    PCA 1994l; PCI august 2003 of an obtust marginal. Catheterization at the time reveal: left main normal; LAD 50-75% between first and second diagonals, 50% after lesion; left circumflex 95% in obtuse marg. RCA 100% proximal.   Diabetes mellitus    type 2   Hyperlipemia    Hypertension    Kidney disease    chronic; stage 2 followed By Dr Deterding   Myocardial infarction [facility])    Osteoarthritis    Peripheral vascular disease ([place])    Tobacco abuse     Past Surgical History:  Procedure Laterality Date   APPENDECTOMY     CHOLECYSTECTOMY     CORONARY ANGIOPLASTY     stent times 2, last cath 3 years ago   [place] N/A 06/09/2018   Procedure: possible, PLACEMENT OF FUDUCIAL;  Surgeon: Grace Isaac, MD;  [facility] OR;  Service: Thoracic;  Laterality: N/A;   NERVE EXPLORATION  07/29/2012   Procedure: NERVE EXPLORATION;  Surgeon: Schuyler Amor, MD;  Location: [facility];  Service: Orthopedics;  Laterality: Left;  Exploration /repair nerve and tendon left thumb   PERIPHERAL VASCULAR CATHETERIZATION N/A 04/26/2015   Procedure: Abdominal Aortogram;  Surgeon: Rosetta Posner, MD;  Location: [facility];  Service: Cardiovascular;  Laterality: N/A;   VIDEO BRONCHOSCOPY N/A 06/09/2018   Procedure:  VIDEO BRONCHOSCOPY;  Surgeon: Grace Isaac, MD;  Location: [facility];  Service: Thoracic;  Laterality: N/A;   VIDEO BRONCHOSCOPY WITH ENDOBRONCHIAL NAVIGATION N/A 06/09/2018   Procedure: VIDEO BRONCHOSCOPY WITH ENDOBRONCHIAL NAVIGATION;  Surgeon: Grace Isaac, MD;  Location: [facility] OR;  Service: Thoracic;  Laterality: N/A;    Family History  Problem Relation Age of Onset   Coronary artery disease Mother    Heart disease Mother    Cancer Father    Stroke Other    Heart failure Other    Pulmonary embolism Other    Diabetes Brother    Hypertension Brother    Heart attack Brother    Peripheral vascular disease Brother    Heart disease Brother    Hypertension Sister     Social History:  Married. 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 He reports that he quit smoking about 3 years ago. His smoking use included cigarettes. He has a 25.00 pack-year smoking history. He quit smokeless tobacco use about 4 years ago.  His smokeless tobacco use included chew. He reports that he does not drink alcohol or use drugs.    Allergies  Allergen Reactions   Penicillins Other (See Comments)    UNSPECIFIED CHILDHOOD REACTION Has patient had PCN reaction causing immediate rash, facial/tongue/throat swelling, SOB or lightheadedness with hypotension: Unknown Has patient had a PCN reaction causing severe rash involving mucus membranes or skin necrosis: Unknown Has patient had a PCN reaction that required hospitalization: No Has patient had a PCN reaction occurring within the last 10 years: No If all of the above answers are "NO", then may proceed with Cephalosporin use.     Medications Prior to Admission  Medication Sig Dispense Refill   acetaminophen (TYLENOL) 500 MG tablet Take 1,000 mg by mouth daily as needed for fever.      albuterol (PROAIR HFA) 108 (90 Base) MCG/ACT inhaler Inhale 2 puffs into the lungs every 6 (six) hours as needed for wheezing or  shortness of breath.     allopurinol (ZYLOPRIM) 100 MG tablet Take 100 mg by mouth 2 (two) times daily.      amLODipine (NORVASC) 10 MG tablet Take 10 mg by mouth daily.       apixaban (ELIQUIS) 5 MG TABS tablet Take 5 mg by mouth 2 (two) times daily.     aspirin EC 81 MG tablet Take 1 tablet (81 mg total) by mouth daily. 90 tablet 3   benazepril (LOTENSIN) 40 MG tablet Take 40 mg by mouth at bedtime.      benzonatate (TESSALON) 100 MG capsule Take 100 mg by mouth 3 (three) times daily as needed for cough.      calcitRIOL (ROCALTROL) 0.25 MCG capsule Take 0.25 mcg by mouth daily.   6   Cinnamon 500 MG capsule Take 500 mg by mouth daily.     diclofenac sodium (VOLTAREN) 1 % GEL APPLY 2 GRAMS TOPICALLY TO THE AFFECTED AREA(S) ON FOOT FOUR TIMES DAILY (Patient taking differently: Apply 2 g topically 2 (two) times daily as needed (pain). ) 100 g 2   furosemide (LASIX) 80 MG tablet Take 80 mg by mouth  daily.      Insulin Glargine, 1 Unit Dial, (TOUJEO SOLOSTAR) 300 UNIT/ML SOPN Inject 27 Units into the skin daily with breakfast.     insulin lispro (HUMALOG) 100 UNIT/ML injection Inject 11 Units into the skin 2 (two) times daily before lunch and supper.      levofloxacin (LEVAQUIN) 250 MG tablet Take 250-500 mg by mouth See admin instructions. Start date 03/10/19: take 2 tablets (500 mg) by mouth at bedtime 1st day, then take 1 tablet (250 mg) daily at bedtime for 6 days     metoprolol (LOPRESSOR) 100 MG tablet Take 100 mg by mouth 2 (two) times daily.       mirtazapine (REMERON) 15 MG tablet Take 15 mg by mouth at bedtime.     promethazine-codeine (PHENERGAN WITH CODEINE) 6.25-10 MG/5ML syrup Take 5 mLs by mouth every 6 (six) hours as needed (severe cough).      Turmeric 400 MG CAPS Take 400 mg by mouth daily.       Drug Regimen Review  Drug regimen was reviewed and remains appropriate with no significant issues identified  Home: Home Living Family/patient expects to be discharged  to:: Private residence Living Arrangements: Spouse/significant other Available Help at Discharge: Available 24 hours/day Type of Home: House Home Access: Stairs to enter [employer] of Steps: 3 in the front, 1 in the back.  railing on both sides in back Entrance Stairs-Rails: Right, Left, Can reach both Home Layout: One level Bathroom Shower/Tub: Multimedia programmer: Standard Bathroom Accessibility: Yes Home Equipment: None  Lives With: Spouse   Functional History: Prior Function Level of Independence: Independent  Functional Status:  Mobility: Bed Mobility Overal bed mobility: Needs Assistance Bed Mobility: Supine to Sit, Sit to Supine Supine to sit: Supervision Sit to supine: Supervision General bed mobility comments: Supervision for safety.  Transfers Overall transfer level: Needs assistance Equipment used: Rolling walker (2 wheeled) Transfers: Sit to/from Stand Sit to Stand: Min assist Stand pivot transfers: Mod assist General transfer comment: Min A for steadying assist to stand with RW. Heavy reliance on UEs once standing.  Ambulation/Gait Ambulation/Gait assistance: Mod assist Gait Distance (Feet): 20 Feet Assistive device: Rolling walker (2 wheeled) Gait Pattern/deviations: Step-to pattern, Step-through pattern, Ataxic, Staggering left, Staggering right General Gait Details: Worked on multiple bouts of forward and backward walking and focused on controlled steps with RLE. Cues for heel to toe gait pattern and looking for target on floor when taking steps to help with control. Pt remains VERY unsteady, especially during backwards walking.  Gait velocity: Decreased     ADL: ADL Overall ADL's : Needs assistance/impaired Eating/Feeding: Minimal assistance Eating/Feeding Details (indicate cue type and reason): to cut, open containers and with items such as soup Grooming: Wash/dry hands, Wash/dry face, Oral care, Applying deodorant, Set up Upper  Body Bathing: Minimal assistance, Sitting Lower Body Bathing: Maximal assistance, Sit to/from stand Lower Body Bathing Details (indicate cue type and reason): pt requires signficant assistance for dynamic standing balance Upper Body Dressing : Minimal assistance Lower Body Dressing: Maximal assistance Toilet Transfer: Moderate assistance, +2 for physical assistance Toilet Transfer Details (indicate cue type and reason): mod a x2 due to signficant ataxia RLE greater than LLE.  Pt with several LOB during basic mobility.  Hand held assist x2 Toileting- Clothing Manipulation and Hygiene: Moderate assistance Functional mobility during ADLs: +2 for physical assistance, Moderate assistance General ADL Comments: mod a x2 hand held (pt would not be able to control walker at this time)  Cognition: Cognition Overall Cognitive Status: Within Functional Limits for tasks assessed Arousal/Alertness: Awake/alert Orientation Level: Oriented X4 Attention: Focused, Sustained Focused Attention: Appears intact(Vigilance WNL: 1/1) Sustained Attention: Appears intact(Serial 7s: 3/3) Memory: Impaired Memory Impairment: Retrieval deficit, Decreased recall of new information(Immediate: 4/5; Delayed: 2/5; with cues: 3/3 ) Awareness: Appears intact Problem Solving: Appears intact(5/5) Executive Function: Reasoning, Sequencing, Organizing Reasoning: Appears intact(2/2) Sequencing: Appears intact(Clock drawing: 3/3) Organizing: Appears intact(Backward digit span: 1/1) Cognition Arousal/Alertness: Awake/alert Behavior During Therapy: WFL for tasks assessed/performed Overall Cognitive Status: Within Functional Limits for tasks assessed General Comments: AxOx4 motivated and eager "get better"   Blood pressure (!) 121/46, pulse 73, temperature 98.3 F (36.8 C), temperature source Oral, resp. rate 17, height 6' (1.829 m), weight 95.3 kg, SpO2 96 %. Physical Exam  Nursing note and vitals reviewed. Constitutional:  He is oriented to person, place, and time. He appears well-developed and well-nourished. No distress. Nasal cannula in place.  HENT:  Head: Normocephalic and atraumatic.  Eyes: Pupils are equal, round, and reactive to light. EOM are normal.  Neck: Normal range of motion. No tracheal deviation present. No thyromegaly present.  Cardiovascular: Normal rate and regular rhythm. Exam reveals no gallop and no friction rub.  No murmur heard. Respiratory: Effort normal. No respiratory distress. He has no wheezes.  GI: Soft. He exhibits no distension. There is no abdominal tenderness.  Neurological: He is alert and oriented to person, place, and time. He displays normal reflexes. No cranial nerve deficit. He exhibits normal muscle tone. Coordination abnormal.  Mild right facial weakness with anarthria/mild dysarthria. Able to follow commands without difficulty. Normal insight and awareness. Normal language.  Ataxia RUE>RLE with FTN and HTS. Strength 4+/5 RUE and RLE and 5/5 LUE and LLE. No sensory findings.   Skin: Skin is warm and dry. He is not diaphoretic.  Psychiatric: He has a normal mood and affect. His behavior is normal. Judgment and thought content normal.    Results for orders placed or performed during the hospital encounter of 03/15/19 (from the past 48 hour(s))  Glucose, capillary     Status: Abnormal   Collection Time: 03/17/19 11:25 AM  Result Value Ref Range   Glucose-Capillary 153 (H) 70 - 99 mg/dL  Glucose, capillary     Status: Abnormal   Collection Time: 03/17/19  4:19 PM  Result Value Ref Range   Glucose-Capillary 158 (H) 70 - 99 mg/dL  Glucose, capillary     Status: Abnormal   Collection Time: 03/17/19  8:57 PM  Result Value Ref Range   Glucose-Capillary 191 (H) 70 - 99 mg/dL   Comment 1 Notify RN    Comment 2 Document in Chart   Basic metabolic panel     Status: Abnormal   Collection Time: 03/18/19  4:32 AM  Result Value Ref Range   Sodium 140 135 - 145 mmol/L    Potassium 4.8 3.5 - 5.1 mmol/L   Chloride 106 98 - 111 mmol/L   CO2 24 22 - 32 mmol/L   Glucose, Bld 191 (H) 70 - 99 mg/dL   BUN 32 (H) 8 - 23 mg/dL   Creatinine, Ser 1.51 (H) 0.61 - 1.24 mg/dL   Calcium 8.8 (L) 8.9 - 10.3 mg/dL   GFR calc non Af Amer 46 (L) >60 mL/min   GFR calc Af Amer 54 (L) >60 mL/min   Anion gap 10 5 - 15    Comment: Performed at [facility], [address] [place],  [address]  CBC     Status: None   Collection Time: 03/18/19  4:32 AM  Result Value Ref Range   WBC 8.6 4.0 - 10.5 K/uL   RBC 5.08 4.22 - 5.81 MIL/uL   Hemoglobin 13.5 13.0 - 17.0 g/dL   HCT 43.3 39.0 - 52.0 %   MCV 85.2 80.0 - 100.0 fL   MCH 26.6 26.0 - 34.0 pg   MCHC 31.2 30.0 - 36.0 g/dL   RDW 13.9 11.5 - 15.5 %   Platelets 228 150 - 400 K/uL   nRBC 0.0 0.0 - 0.2 %    Comment: Performed at [facility], [place] [address], [place], [place] [address]  Magnesium     Status: None   Collection Time: 03/18/19  4:32 AM  Result Value Ref Range   Magnesium 1.9 1.7 - 2.4 mg/dL    Comment: Performed at [facility] [address], [place], [place] [address]  Glucose, capillary     Status: Abnormal   Collection Time: 03/18/19  6:07 AM  Result Value Ref Range   Glucose-Capillary 167 (H) 70 - 99 mg/dL  Glucose, capillary     Status: Abnormal   Collection Time: 03/18/19 11:49 AM  Result Value Ref Range   Glucose-Capillary 212 (H) 70 - 99 mg/dL  Glucose, capillary     Status: Abnormal   Collection Time: 03/18/19  4:05 PM  Result Value Ref Range   Glucose-Capillary 150 (H) 70 - 99 mg/dL  Glucose, capillary     Status: Abnormal   Collection Time: 03/18/19  9:07 PM  Result Value Ref Range   Glucose-Capillary 244 (H) 70 - 99 mg/dL  CBC     Status: Abnormal   Collection Time: 03/19/19  3:59 AM  Result Value Ref Range   WBC 8.6 4.0 - 10.5 K/uL   RBC 4.69 4.22 - 5.81 MIL/uL   Hemoglobin 12.3 (L) 13.0 - 17.0 g/dL   HCT 40.1 39.0 - 52.0 %   MCV 85.5 80.0 - 100.0 fL   MCH 26.2  26.0 - 34.0 pg   MCHC 30.7 30.0 - 36.0 g/dL   RDW 14.1 11.5 - 15.5 %   Platelets 218 150 - 400 K/uL   nRBC 0.0 0.0 - 0.2 %    Comment: Performed at [facility], [place]. [address], [place], [place] [address]  Glucose, capillary     Status: Abnormal   Collection Time: 03/19/19  6:08 AM  Result Value Ref Range   Glucose-Capillary 148 (H) 70 - 99 mg/dL  Glucose, capillary     Status: Abnormal   Collection Time: 03/19/19  7:48 AM  Result Value Ref Range   Glucose-Capillary 156 (H) 70 - 99 mg/dL  Basic metabolic panel     Status: Abnormal   Collection Time: 03/19/19  8:19 AM  Result Value Ref Range   Sodium 141 135 - 145 mmol/L   Potassium 4.4 3.5 - 5.1 mmol/L   Chloride 106 98 - 111 mmol/L   CO2 26 22 - 32 mmol/L   Glucose, Bld 156 (H) 70 - 99 mg/dL   BUN 39 (H) 8 - 23 mg/dL   Creatinine, Ser 1.64 (H) 0.61 - 1.24 mg/dL   Calcium 8.7 (L) 8.9 - 10.3 mg/dL   GFR calc non Af Amer 42 (L) >60 mL/min   GFR calc Af Amer 49 (L) >60 mL/min   Anion gap 9 5 - 15    Comment: Performed at [facility],  [address], [place], [place] [address]   Ct Chest W Contrast  Result Date: 03/17/2019 CLINICAL DATA:  Squamous cell carcinoma the lungs. Right cerebellar stroke. EXAM: CT CHEST, ABDOMEN, AND PELVIS WITH CONTRAST TECHNIQUE: Multidetector CT imaging of the chest, abdomen and pelvis was performed following the standard protocol during bolus administration of intravenous contrast. CONTRAST:  74mL OMNIPAQUE IOHEXOL 300 MG/ML  SOLN COMPARISON:  Chest CT from 06/04/2018 and PET-CT from 08/15/2017 FINDINGS: CT CHEST FINDINGS Cardiovascular: Coronary, aortic arch, and branch vessel atherosclerotic vascular disease. Mild cardiomegaly. Interventricular septal thickening suggesting left ventricular hypertrophy. Mediastinum/Nodes: Right paratracheal node 1.0 cm in short axis on image 19/3, formerly 0.6 cm. Additional small right paratracheal lymph nodes are below 1 cm in diameter but mildly increased from  prior exam. Subcarinal node 1.1 cm in short axis on image 30/3, formerly 0.5 cm. Bilateral gynecomastia. Lungs/Pleura: Small right pleural effusion. There is complete occlusion of the right upper lobe bronchus. There is atelectasis and possibly some mild alveolar filling airspace opacity posteriorly in the right upper lobe, with aerated anterior portion of the right upper lobe. Airway thickening along the bronchus intermedius resulting in some luminal narrowing, with airway thickening centrally in the right middle lobe and right lower lobe. Superior segment right lower lobe airspace opacity on image 67/5 could be from prior radiation therapy though infection is not excluded. Fiducials are noted in the superior segment right lower lobe. Further inferiorly in the vicinity of the prior pulmonary nodule, there is indistinct patchy opacity which could be from radiation pneumonitis or pneumonia, for example as shown on image 93/5. The original nodule is not readily apparent against this background density. There is some associated surrounding ground-glass opacity. There is some faint nodularity peripherally in the left upper lobe. On image 62/5, a triangular 0.4 by 0.3 cm nodule is new or significantly increased from prior. Indistinct ground-glass density nodule in the left upper lobe measuring 0.9 cm in diameter on image 78/5 is new. Subsegmental atelectasis is present anteriorly in left lower lobe. Small fiducials are present anteriorly in the left lower lobe and there are bandlike opacities along the left hemidiaphragm with crowding of bronchovascular structures suggesting some degree of volume loss. The lobulated nodule along the inferior pulmonary ligament is somewhat obscured by surrounding atelectasis, for example on image 114/5, but is less sharply defined and possibly less prominent. There is some additional nodularity in this vicinity, for example posteriorly on image 112/5 where there is a 1.7 by 1.3 cm  ill-defined nodular density, probably inflammatory. Musculoskeletal: Thoracic spondylosis. CT ABDOMEN PELVIS FINDINGS Hepatobiliary: Cholecystectomy. The common bile duct measures 1.1 cm in diameter, not changed from 08/15/2017, probably a physiologic response to cholecystectomy. No focal liver lesion is identified. Pancreas: Unremarkable Spleen: Unremarkable Adrenals/Urinary Tract: 1.7 by 1.8 cm left adrenal mass is unchanged in size from 08/15/2017 and was not previously hypermetabolic, hence likely benign. Small hypodense lesions of the left mid kidney right kidney upper pole probably cysts but technically too small to characterize. Considerable vascular calcifications along the renal hila bilaterally. No definite urinary tract calculi. Stomach/Bowel: Unremarkable Vascular/Lymphatic: Aortoiliac atherosclerotic vascular disease. No pathologic adenopathy identified. Reproductive: Faint calcification in the right posterior prostate gland. Other: No supplemental non-categorized findings. Musculoskeletal: Incidental lipoma in the right vastus lateralis muscle. Degenerative disc disease at L4-5. IMPRESSION: 1. Both the right lower lobe pulmonary nodule on the left lower lobe pulmonary nodule are reduced in conspicuity with some degree of surrounding airspace opacity, possibly radiation pneumonitis if the  patient has had interval radiation therapy. 2. Complete occlusion of the right upper lobe bronchus. Most of the right upper lobe is collapsed potentially with a small amount of airspace filling process; there is air trapping in portions of the right upper lobe anteriorly. 3. Airway thickening centrally in the right lung, resulting in some luminal narrowing of the bronchus intermedius. 4. There is a new 4 mm triangular-shaped left upper lobe pulmonary nodule along with a new small ground-glass density nodule in the left upper lobe which merit surveillance. 5. Mildly enlarged paratracheal and subcarinal lymph nodes  likewise merits surveillance although could be reactive. 6. Other imaging findings of potential clinical significance: Aortic Atherosclerosis (ICD10-I70.0). Coronary atherosclerosis. Mild cardiomegaly with suggestion of left ventricular hypertrophy. Small right pleural effusion. Chronically stable small left adrenal mass, not previously hypermetabolic on PET-CT and hence likely benign. Electronically Signed   By: Van Clines M.D.   On: 03/17/2019 15:11   Ct Abdomen Pelvis W Contrast  Result Date: 03/17/2019 CLINICAL DATA:  Squamous cell carcinoma the lungs. Right cerebellar stroke. EXAM: CT CHEST, ABDOMEN, AND PELVIS WITH CONTRAST TECHNIQUE: Multidetector CT imaging of the chest, abdomen and pelvis was performed following the standard protocol during bolus administration of intravenous contrast. CONTRAST:  29mL OMNIPAQUE IOHEXOL 300 MG/ML  SOLN COMPARISON:  Chest CT from 06/04/2018 and PET-CT from 08/15/2017 FINDINGS: CT CHEST FINDINGS Cardiovascular: Coronary, aortic arch, and branch vessel atherosclerotic vascular disease. Mild cardiomegaly. Interventricular septal thickening suggesting left ventricular hypertrophy. Mediastinum/Nodes: Right paratracheal node 1.0 cm in short axis on image 19/3, formerly 0.6 cm. Additional small right paratracheal lymph nodes are below 1 cm in diameter but mildly increased from prior exam. Subcarinal node 1.1 cm in short axis on image 30/3, formerly 0.5 cm. Bilateral gynecomastia. Lungs/Pleura: Small right pleural effusion. There is complete occlusion of the right upper lobe bronchus. There is atelectasis and possibly some mild alveolar filling airspace opacity posteriorly in the right upper lobe, with aerated anterior portion of the right upper lobe. Airway thickening along the bronchus intermedius resulting in some luminal narrowing, with airway thickening centrally in the right middle lobe and right lower lobe. Superior segment right lower lobe airspace opacity on image  67/5 could be from prior radiation therapy though infection is not excluded. Fiducials are noted in the superior segment right lower lobe. Further inferiorly in the vicinity of the prior pulmonary nodule, there is indistinct patchy opacity which could be from radiation pneumonitis or pneumonia, for example as shown on image 93/5. The original nodule is not readily apparent against this background density. There is some associated surrounding ground-glass opacity. There is some faint nodularity peripherally in the left upper lobe. On image 62/5, a triangular 0.4 by 0.3 cm nodule is new or significantly increased from prior. Indistinct ground-glass density nodule in the left upper lobe measuring 0.9 cm in diameter on image 78/5 is new. Subsegmental atelectasis is present anteriorly in left lower lobe. Small fiducials are present anteriorly in the left lower lobe and there are bandlike opacities along the left hemidiaphragm with crowding of bronchovascular structures suggesting some degree of volume loss. The lobulated nodule along the inferior pulmonary ligament is somewhat obscured by surrounding atelectasis, for example on image 114/5, but is less sharply defined and possibly less prominent. There is some additional nodularity in this vicinity, for example posteriorly on image 112/5 where there is a 1.7 by 1.3 cm ill-defined nodular density, probably inflammatory. Musculoskeletal: Thoracic spondylosis. CT ABDOMEN PELVIS FINDINGS Hepatobiliary: Cholecystectomy. The common bile  duct measures 1.1 cm in diameter, not changed from 08/15/2017, probably a physiologic response to cholecystectomy. No focal liver lesion is identified. Pancreas: Unremarkable Spleen: Unremarkable Adrenals/Urinary Tract: 1.7 by 1.8 cm left adrenal mass is unchanged in size from 08/15/2017 and was not previously hypermetabolic, hence likely benign. Small hypodense lesions of the left mid kidney right kidney upper pole probably cysts but  technically too small to characterize. Considerable vascular calcifications along the renal hila bilaterally. No definite urinary tract calculi. Stomach/Bowel: Unremarkable Vascular/Lymphatic: Aortoiliac atherosclerotic vascular disease. No pathologic adenopathy identified. Reproductive: Faint calcification in the right posterior prostate gland. Other: No supplemental non-categorized findings. Musculoskeletal: Incidental lipoma in the right vastus lateralis muscle. Degenerative disc disease at L4-5. IMPRESSION: 1. Both the right lower lobe pulmonary nodule on the left lower lobe pulmonary nodule are reduced in conspicuity with some degree of surrounding airspace opacity, possibly radiation pneumonitis if the patient has had interval radiation therapy. 2. Complete occlusion of the right upper lobe bronchus. Most of the right upper lobe is collapsed potentially with a small amount of airspace filling process; there is air trapping in portions of the right upper lobe anteriorly. 3. Airway thickening centrally in the right lung, resulting in some luminal narrowing of the bronchus intermedius. 4. There is a new 4 mm triangular-shaped left upper lobe pulmonary nodule along with a new small ground-glass density nodule in the left upper lobe which merit surveillance. 5. Mildly enlarged paratracheal and subcarinal lymph nodes likewise merits surveillance although could be reactive. 6. Other imaging findings of potential clinical significance: Aortic Atherosclerosis (ICD10-I70.0). Coronary atherosclerosis. Mild cardiomegaly with suggestion of left ventricular hypertrophy. Small right pleural effusion. Chronically stable small left adrenal mass, not previously hypermetabolic on PET-CT and hence likely benign. Electronically Signed   By: Van Clines M.D.   On: 03/17/2019 15:11   Vas [place] Transcranial Doppler W Bubbles  Result Date: 03/18/2019  Transcranial Doppler with Bubble Indications: Stroke. Performing  Technologist: Abram Sander RVS  Examination Guidelines: A complete evaluation includes B-mode imaging, spectral Doppler, color Doppler, and power Doppler as needed of all accessible portions of each vessel. Bilateral testing is considered an integral part of a complete examination. Limited examinations for reoccurring indications may be performed as noted.  Summary:  A vascular evaluation was performed. The left middle cerebral artery was studied. An IV was inserted into the patient's right forearm. Verbal informed consent was obtained.  Interpretation: HITS heard at rest: None HITS heard during valsalva: None *See table(s) above for measurements and observations.    Preliminary        Medical Problem List and Plan: 1.  Functional and mobility deficits secondary to right cerebellar infarct  -admit to inpatient rehab 2.  Antithrombotics: -Acute left DVT/PE dx-10/2018/anticoagulation:  Pharmaceutical: Other (comment)--Eliquis treatment dose X 2 weeks followed by maintaince dose.   -antiplatelet therapy: ASA 3. Pain Management: N/A 4. Mood: LCSW to follow for evaluation and support.   -antipsychotic agents: N/A 5. Neuropsych: This patient is capable of making decisions on his own behalf. 6. Skin/Wound Care: Routine pressure relief measures.  7. Fluids/Electrolytes/Nutrition: Monitor I/O. Check lytes in am.  8. HTN: Monitor BP tid. Continue Benazepril, Metoprolol and Amlodipine. Lasix on hold.  9. CKD Stage III: Baseline SCr around 2.2. BUN/SCr- 53/2.05 at admission--now down to 39/1.64. Avoid nephrotoxic medications. Avoid hypoperfusion post-stroke.  10. Nausea/Vomiting: Denies symptoms at present 11. CAD/PVD: On low dose ASA, Lipitor, Eliquis and BB 12. T2DM: Monitor BS ac/hs --Was on Tujeo 300- 27 units  daily with humalog 11 units bid lunch/supper. Continue SSI for now as BS controlled but not eating as much. Will order double portions of protein/veggies.  13. Squamous cell lung cancer: S/p  XRT 14. Recent PNA/Hypoxia:  Was completing course of Levaquin at admission. No respiratiory symptoms or leukocytosis presently. Question aspiration event at admission with RUL changes  -continued use of flutter valve.    -doxycycline bid day# 2      Bary Leriche, PA-C 03/19/2019

## 2019-03-19 NOTE — Progress Notes (Signed)
Patient arrived via bed from [facility], assigned to 4W23, [facility]. Patient appears alert and denies pain at this time.

## 2019-03-19 NOTE — Progress Notes (Signed)
STROKE TEAM PROGRESS NOTE   INTERVAL HISTORY Pt lying in bed, not in distress. Cre still elevated, will hold off CTA neck as it will not change management. Discussed with Dr. Posey Pronto. Plan to CIR today.     Vitals:   03/19/19 0830 03/19/19 0853 03/19/19 1145 03/19/19 1423  BP:   136/71   Pulse:  73 70   Resp:  17 17   Temp:  98.3 F (36.8 C) 97.8 F (36.6 C)   TempSrc:  Oral Oral   SpO2: 94% 96% 95% 96%  Weight:      Height:        CBC:  Recent Labs  Lab 03/15/19 1036  03/17/19 0426 03/18/19 0432 03/19/19 0359  WBC 9.6   < > 8.8 8.6 8.6  NEUTROABS 8.6*  --  7.1  --   --   HGB 14.1   < > 13.2 13.5 12.3*  HCT 45.6   < > 42.3 43.3 40.1  MCV 86.5   < > 86.0 85.2 85.5  PLT 277   < > 235 228 218   < > = values in this interval not displayed.    Basic Metabolic Panel:  Recent Labs  Lab 03/17/19 0426 03/18/19 0432 03/19/19 0819  NA 142 140 141  K 4.7 4.8 4.4  CL 108 106 106  CO2 26 24 26   GLUCOSE 180* 191* 156*  BUN 33* 32* 39*  CREATININE 1.39* 1.51* 1.64*  CALCIUM 8.7* 8.8* 8.7*  MG 2.0 1.9  --   PHOS 3.0  --   --    Lipid Panel:     Component Value Date/Time   CHOL 90 03/16/2019 0617   TRIG 95 03/16/2019 0617   HDL 25 (L) 03/16/2019 0617   CHOLHDL 3.6 03/16/2019 0617   VLDL 19 03/16/2019 0617   LDLCALC 46 03/16/2019 0617   HgbA1c:  Lab Results  Component Value Date   HGBA1C 8.1 (H) 03/16/2019   Urine Drug Screen: No results found for: LABOPIA, COCAINSCRNUR, LABBENZ, AMPHETMU, THCU, LABBARB  Alcohol Level No results found for: [facility]  IMAGING Vas [place] Transcranial Doppler W Bubbles  Result Date: 03/18/2019  Transcranial Doppler with Bubble Indications: Stroke. Performing Technologist: Abram Sander RVS  Examination Guidelines: A complete evaluation includes B-mode imaging, spectral Doppler, color Doppler, and power Doppler as needed of all accessible portions of each vessel. Bilateral testing is considered an integral part of a complete examination. Limited  examinations for reoccurring indications may be performed as noted.  Summary:  A vascular evaluation was performed. The left middle cerebral artery was studied. An IV was inserted into the patient's right forearm. Verbal informed consent was obtained.  Interpretation: HITS heard at rest: None HITS heard during valsalva: None *See table(s) above for measurements and observations.    Preliminary    2D Echocardiogram  1. The left ventricle has normal systolic function, with an ejection fraction of 60-65%. The cavity size was normal. There is moderately increased left ventricular wall thickness. Left ventricular diastolic Doppler parameters are consistent with  impaired relaxation.  2. The right ventricle has normal systolc function. The cavity was normal.  3. The aortic valve is tricuspid Mild calcification of the aortic valve. No stenosis of the aortic valve.  4. The inferior vena cava was dilated in size with >50% respiratory variability.  5. Technically difficult; definity used; normal LV systolic function; moderate LVH; probable mild diastolic dysfunction; calcified aortic valve with reduced excursion of left coronary cusp but no AS  by doppler.   PHYSICAL EXAM   Temp:  [97.8 F (36.6 C)-98.8 F (37.1 C)] 98.8 F (37.1 C) (05/08 1558) Pulse Rate:  [66-79] 70 (05/08 1558) Resp:  [16-18] 18 (05/08 1558) BP: (111-151)/(46-76) 137/61 (05/08 1558) SpO2:  [92 %-100 %] 100 % (05/08 1558)  General - Well nourished, well developed, in no acute distress  Ophthalmologic - fundi not visualized due to noncooperation.  Cardiovascular - Regular rate and rhythm.  Mental Status -  Level of arousal and orientation to time, place, and person were intact. Language including expression, naming, repetition, comprehension was assessed and found intact.  Slight dysarthria  Cranial Nerves II - XII - II - Visual field intact OU. III, IV, VI - Extraocular movements intact. V - Facial sensation intact  bilaterally. VII - Facial movement intact bilaterally. VIII - Hearing & vestibular intact bilaterally. X - Palate elevates symmetrically. XI - Chin turning & shoulder shrug intact bilaterally. XII - Tongue protrusion intact.  Motor Strength - The patient's strength was normal in all extremities and pronator drift was absent.  Bulk was normal and fasciculations were absent.   Motor Tone - Muscle tone was assessed at the neck and appendages and was normal.  Reflexes - The patient's reflexes were symmetrical in all extremities and he had no pathological reflexes.  Sensory - Light touch, temperature/pinprick were assessed and were symmetrical.    Coordination - The patient had normal movements in the left hand and foot with no ataxia or dysmetria.  However, left subtle finger-to-nose and heel-to-shin slight ataxia.  Tremor was absent.  Gait and Station - deferred.   ASSESSMENT/PLAN Mr. BURL TAUZIN is a 68 y.o. male with history of PE 2 mos ago on Eliquis, B squamous cell cancer of the lungs, CAD s/p PCA, COPD, DB, HTN, HLD, CKD stage 2, PVD and tobacco abuse presenting with dizziness, nausea and vomiting, trouble saying words.   Stroke:   R SCA territory cerebellar infarct even on aspirin and Eliquis, likely embolic pattern, secondary to unknown source.   CT head R cerebellar infarct. Small vessel disease. Atrophy.   MRI  R superolateral cerebellar infarct. Mod Small vessel disease.   MRA  Unremarkable   CTA neck was cancelled due to elevated Cre - not change management  Carotid Doppler  B ICA 1-39% stenosis, VAs antegrade (March 2020)  2D Echo EF 60-65%. No source of embolus   LE venous Doppler L posterior tibial acute DVT  TCD bubble study negative for PFO  LDL 96  HgbA1c 8.1  HIV neg  Lovenox 30 mg sq daily for VTE prophylaxis  On diet  aspirin 81 mg daily and Eliquis (apixaban) daily prior to admission, now on aspirin 81 mg daily and Eliquis (apixaban) daily.  Continue at d.c  Therapy recommendations:  CIR  Disposition:  pending   DVT on Eliquis  LE venous Doppler L posterior tibial acute DVT  TCD bubble study no PFO  Continue aspirin 81 and eliquis  Pharmacy adjusted eliquis dose for DVT treatment  History of PE  As per chart, he was dyspneic in December 2019 and hypoxic.   V/Q scan at that time with possible left PE.   He was started on Eliquis.  Lung nodules s/p radiation in 2019  [facility] lung [facility]  S/p radiation   Patient stated that he had a 3 biopsy in 3 different locations, but all were negative for cancer  CT C/T/P no concern of malignancy, but with  contrast B LL nodules reduced w/ surrounding airspace opacity. Complete occlusion RUL bronchus w/ most of the RUL collapsed w/ sm amt airspace filling. Airway thickening R lung w/ narrowing of the bronchus intermedius. New 69mm LUL pulm nodule with new small ground glass density nodule LUL. mildly enlarged paratracheal and subcarinal LNs.   AKI on CKD stage 2, improving  Creatinine 2.27-2.05-1.60-1.39-1.51-1.64  Encourage p.o. intake  BMP monitoring  Mild respiratory distress  History of smoking and COPD as always lung nodule/cancer  Off NSR, on [place]  COVID neg  Off Levaquin  Treatment per primary team  Hypertension  Stable  On home metoprolol, Norvasc, Lasix and Lotensin . Permissive hypertension (OK if <180/105) but gradually normalize in 2-3 days . Long-term BP goal normotensive  Diabetes type II Uncontrolled  HgbA1c 8.1, goal < 7.0  SSI  CBG monitoring  Close PCP follow-up  Tobacco abuse  Current smoker  Smoking cessation counseling provided  Pt is willing to quit  Other Stroke Risk Factors  Advanced age  Hx smokeless tobacco use  Coronary artery disease MI s/p stenting and PCI  PVD  Other Active Problems    Hospital day # 4  Neurology will sign off. Please call with questions. Pt will follow up with  stroke clinic NP at [facility] in about 4 weeks. Thanks for the consult.   Rosalin Hawking, MD PhD Stroke Neurology 03/19/2019 6:46 PM    To contact Stroke Continuity provider, please refer to [email or web address] After hours, contact General Neurology

## 2019-03-19 NOTE — Progress Notes (Signed)
Physical Therapy Treatment Patient Details Name: Travis [name] MRN: [id number] DOB: [birth year] Today's Date: 03/19/2019    History of Present Illness Travis [name] is an 68 y.o. male who presents to the ED today with symptoms of dizziness with N/V since early this AM at 0100.  Of note, O2 saturations in the ED were 89-92%, so he was started on 2L [place]. 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 CT head revealed a subacute right cerebellar hemisphere ischemic infarction. Pt also found to have LLE DVT on 5/6; per notes, keeping anticoagulation meds the same for now.     PT Comments    Pt progressing towards goals, however, remains grossly unsteady. Required mod A for gait with RW. Educated about slowing steps in order to work on control of RLE. Current recommendations appropriate. Will continue to follow acutely to maximize functional mobility independence and safety.     Follow Up Recommendations  CIR     Equipment Recommendations  None recommended by PT    Recommendations for Other Services Rehab consult     Precautions / Restrictions Precautions Precautions: Fall Restrictions Weight Bearing Restrictions: No    Mobility  Bed Mobility               General bed mobility comments: Sitting EOB upon entry.   Transfers Overall transfer level: Needs assistance Equipment used: Rolling walker (2 wheeled);1 person hand held assist Transfers: Sit to/from Stand Sit to Stand: Mod assist;Min assist         General transfer comment: Mod A for steadying to stand without AD, and min A to stand with use of RW.   Ambulation/Gait Ambulation/Gait assistance: Mod assist Gait Distance (Feet): 50 Feet Assistive device: 1 person hand held assist;Rolling walker (2 wheeled) Gait Pattern/deviations: Step-to pattern;Step-through pattern;Ataxic;Staggering left;Staggering right Gait velocity: Decreased     General Gait Details: Practiced side steps without use of AD and HHA, however, pt VERY unsteady. Used RW to ambulate out in hallway. Mildly improved steadiness, however, pt remained ataxic and unsteady requiring mod A for steadying assist. Pt with narrow BOS especially during turns and required cues for appropriate BOS width. Also gave cues to slow gait and to work on controlled steps.    Stairs             Wheelchair Mobility    Modified Rankin (Stroke Patients Only) Modified Rankin (Stroke Patients Only) Pre-Morbid Rankin Score: No symptoms Modified Rankin: Moderately severe disability     Balance Overall balance assessment: Needs assistance Sitting-balance support: Feet unsupported;No upper extremity supported Sitting balance-Leahy Scale: Fair     Standing balance support: Bilateral upper extremity supported Standing balance-Leahy Scale: Poor Standing balance comment: Reliant on external and UE support                             Cognition Arousal/Alertness: Awake/alert Behavior During Therapy: WFL for tasks assessed/performed Overall Cognitive Status: Within Functional Limits for tasks assessed                                        Exercises      General Comments        Pertinent Vitals/Pain Pain Assessment: No/denies pain    Home Living  Prior Function            PT Goals (current goals can now be found in the care plan section) Acute Rehab PT Goals Patient Stated Goal: I want to be able to walk again and go home when I can PT Goal Formulation: With patient Time For Goal Achievement: 03/30/19 Potential to Achieve Goals: Good Progress towards PT goals: Progressing toward goals    Frequency    Min 4X/week      PT Plan Current plan remains appropriate    Co-evaluation              AM-PAC PT "6 Clicks" Mobility   Outcome Measure  Help needed turning from your back to your  side while in a flat bed without using bedrails?: A Little Help needed moving from lying on your back to sitting on the side of a flat bed without using bedrails?: A Little Help needed moving to and from a bed to a chair (including a wheelchair)?: A Lot Help needed standing up from a chair using your arms (e.g., wheelchair or bedside chair)?: A Lot Help needed to walk in hospital room?: A Lot Help needed climbing 3-5 steps with a railing? : Total 6 Click Score: 13    End of Session Equipment Utilized During Treatment: Gait belt;Oxygen Activity Tolerance: Patient tolerated treatment well Patient left: in bed;with call bell/phone within reach;with bed alarm set;with nursing/sitter in room(sitting EOB ) Nurse Communication: Mobility status PT Visit Diagnosis: Unsteadiness on feet (R26.81);Other abnormalities of gait and mobility (R26.89);Difficulty in walking, not elsewhere classified (R26.2);Ataxic gait (R26.0)     Time: 1587-2761 PT Time Calculation (min) (ACUTE ONLY): 12 min  Charges:  $Gait Training: 8-22 mins                     [name], PT, DPT  Acute Rehabilitation Services  Pager: [phone] Office: [phone]    [name] 03/19/2019, 1:48 PM

## 2019-03-19 NOTE — H&P (Signed)
Physical Medicine and Rehabilitation Admission H&P        Chief Complaint  Patient presents with   Stroke with functional decline.       HPI: Travis [name] is a 68 year old male with history of SCLC, CAD s/p PCA, T2DM, COPD, PE--on Eliquis, CKD III; who was admitted on 03/15/19 with nausea/vomiting that started night prior to admission, dizziness, speech changes and hypoxia. CT head revealed acute to subacute right cerebellar infarct without hemorrhage. MRI/MRA  brain done revealing 3cm acute infarct in right superolateral cerebellum and moderate white matter disease in pons and cerebral white matter. No large vessel occlusion seen. 2 D echo showed EF 60-65% with dilated IVC and no interatrial shunt. Patient reported that symptoms occurred after severe coughing episode and Dr. Erlinda Hong recommended CTA neck to rule out dissection of VA if SCr improves. Lasix held with improvement in SCr today.    BLE dopplers revealed acute DVT in left posterior tibial vein.  Patient on ASA/Eliquis prior to admission and Eliquis increased to 10 mg bid for 14 days followed by home dose. CT abdomen/pelvis done to rule out metastatic disease and revealed RLL and LLE nodules to be reduced with question of surrounding radiation pneumonitis, complete occlusion of RUL bronchus with potential collapse of most of the lobe with small amount of airspace filling process and new 4 mm LUL nodule and ground glass density as well as lymph node enlargement question reactive. He continues to have issues with hypoxia but respiratory status improving. Patient with resultant right hemiataxia, dysarthria and anarthria affecting functional status. CIR recommended for progressive therapy.      Review of Systems  Constitutional: Negative for chills and fever.  HENT: Negative for hearing loss and tinnitus.   Eyes: Negative for blurred vision and double vision.  Respiratory: Positive for cough (chronic/baseline) and sputum production (at  baseline).   Cardiovascular: Negative for chest pain, palpitations and leg swelling.  Gastrointestinal: Negative for heartburn and nausea.  Genitourinary: Negative for dysuria and urgency.  Musculoskeletal: Negative for myalgias and neck pain.  Skin: Negative for itching and rash.  Neurological: Negative for dizziness, focal weakness and headaches.  Psychiatric/Behavioral: Negative for depression. The patient is not nervous/anxious and does not have insomnia.           Past Medical History:  Diagnosis Date   CAD (coronary artery disease)     Cancer ([employer])      skin cancer face   COPD (chronic obstructive pulmonary disease) ([place])     Coronary artery disease      PCA 1994l; PCI august 2003 of an obtust marginal. Catheterization at the time reveal: left main normal; LAD 50-75% between first and second diagonals, 50% after lesion; left circumflex 95% in obtuse marg. RCA 100% proximal.   Diabetes mellitus      type 2   Hyperlipemia     Hypertension     Kidney disease      chronic; stage 2 followed By Dr Deterding   Myocardial infarction [facility])     Osteoarthritis     Peripheral vascular disease ([place])     Tobacco abuse             Past Surgical History:  Procedure Laterality Date   APPENDECTOMY       CHOLECYSTECTOMY       CORONARY ANGIOPLASTY        stent times 2, last cath 3 years ago   [place]  06/09/2018    Procedure: possible, PLACEMENT OF FUDUCIAL;  Surgeon: Grace Isaac, MD;  Location: [place];  Service: Thoracic;  Laterality: N/A;   NERVE EXPLORATION   07/29/2012    Procedure: NERVE EXPLORATION;  Surgeon: Schuyler Amor, MD;  Location: [place];  Service: Orthopedics;  Laterality: Left;  Exploration /repair nerve and tendon left thumb   PERIPHERAL VASCULAR CATHETERIZATION N/A 04/26/2015    Procedure: Abdominal Aortogram;  Surgeon: Rosetta Posner, MD;  Location: [facility];  Service: Cardiovascular;  Laterality: N/A;     VIDEO BRONCHOSCOPY N/A 06/09/2018    Procedure: VIDEO BRONCHOSCOPY;  Surgeon: Grace Isaac, MD;  Location: [place];  Service: Thoracic;  Laterality: N/A;   VIDEO BRONCHOSCOPY WITH ENDOBRONCHIAL NAVIGATION N/A 06/09/2018    Procedure: VIDEO BRONCHOSCOPY WITH ENDOBRONCHIAL NAVIGATION;  Surgeon: Grace Isaac, MD;  Location: [facility];  Service: Thoracic;  Laterality: N/A;           Family History  Problem Relation Age of Onset   Coronary artery disease Mother     Heart disease Mother     Cancer Father     Stroke Other     Heart failure Other     Pulmonary embolism Other     Diabetes Brother     Hypertension Brother     Heart attack Brother     Peripheral vascular disease Brother     Heart disease Brother     Hypertension Sister        Social History:  Married. 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 He reports that he quit smoking about 3 years ago. His smoking use included cigarettes. He has a 25.00 pack-year smoking history. He quit smokeless tobacco use about 4 years ago.  His smokeless tobacco use included chew. He reports that he does not drink alcohol or use drugs.           Allergies  Allergen Reactions   Penicillins Other (See Comments)      UNSPECIFIED CHILDHOOD REACTION Has patient had PCN reaction causing immediate rash, facial/tongue/throat swelling, SOB or lightheadedness with hypotension: Unknown Has patient had a PCN reaction causing severe rash involving mucus membranes or skin necrosis: Unknown Has patient had a PCN reaction that required hospitalization: No Has patient had a PCN reaction occurring within the last 10 years: No If all of the above answers are "NO", then may proceed with Cephalosporin use.              Medications Prior to Admission  Medication Sig Dispense Refill   acetaminophen (TYLENOL) 500 MG tablet Take 1,000 mg by mouth daily as needed for fever.        albuterol (PROAIR HFA) 108 (90 Base)  MCG/ACT inhaler Inhale 2 puffs into the lungs every 6 (six) hours as needed for wheezing or shortness of breath.       allopurinol (ZYLOPRIM) 100 MG tablet Take 100 mg by mouth 2 (two) times daily.        amLODipine (NORVASC) 10 MG tablet Take 10 mg by mouth daily.         apixaban (ELIQUIS) 5 MG TABS tablet Take 5 mg by mouth 2 (two) times daily.       aspirin EC 81 MG tablet Take 1 tablet (81 mg total) by mouth daily. 90 tablet 3   benazepril (LOTENSIN) 40 MG tablet Take 40 mg by mouth at bedtime.  benzonatate (TESSALON) 100 MG capsule Take 100 mg by mouth 3 (three) times daily as needed for cough.        calcitRIOL (ROCALTROL) 0.25 MCG capsule Take 0.25 mcg by mouth daily.    6   Cinnamon 500 MG capsule Take 500 mg by mouth daily.       diclofenac sodium (VOLTAREN) 1 % GEL APPLY 2 GRAMS TOPICALLY TO THE AFFECTED AREA(S) ON FOOT FOUR TIMES DAILY (Patient taking differently: Apply 2 g topically 2 (two) times daily as needed (pain). ) 100 g 2   furosemide (LASIX) 80 MG tablet Take 80 mg by mouth daily.        Insulin Glargine, 1 Unit Dial, (TOUJEO SOLOSTAR) 300 UNIT/ML SOPN Inject 27 Units into the skin daily with breakfast.       insulin lispro (HUMALOG) 100 UNIT/ML injection Inject 11 Units into the skin 2 (two) times daily before lunch and supper.        levofloxacin (LEVAQUIN) 250 MG tablet Take 250-500 mg by mouth See admin instructions. Start date 03/10/19: take 2 tablets (500 mg) by mouth at bedtime 1st day, then take 1 tablet (250 mg) daily at bedtime for 6 days       metoprolol (LOPRESSOR) 100 MG tablet Take 100 mg by mouth 2 (two) times daily.         mirtazapine (REMERON) 15 MG tablet Take 15 mg by mouth at bedtime.       promethazine-codeine (PHENERGAN WITH CODEINE) 6.25-10 MG/5ML syrup Take 5 mLs by mouth every 6 (six) hours as needed (severe cough).        Turmeric 400 MG CAPS Take 400 mg by mouth daily.           Drug Regimen Review  Drug regimen was reviewed  and remains appropriate with no significant issues identified   Home: Home Living Family/patient expects to be discharged to:: Private residence Living Arrangements: Spouse/significant other Available Help at Discharge: Available 24 hours/day Type of Home: House Home Access: Stairs to enter [employer] of Steps: 3 in the front, 1 in the back.  railing on both sides in back Entrance Stairs-Rails: Right, Left, Can reach both Home Layout: One level Bathroom Shower/Tub: Multimedia programmer: Standard Bathroom Accessibility: Yes Home Equipment: None  Lives With: Spouse   Functional History: Prior Function Level of Independence: Independent   Functional Status:  Mobility: Bed Mobility Overal bed mobility: Needs Assistance Bed Mobility: Supine to Sit, Sit to Supine Supine to sit: Supervision Sit to supine: Supervision General bed mobility comments: Supervision for safety.  Transfers Overall transfer level: Needs assistance Equipment used: Rolling walker (2 wheeled) Transfers: Sit to/from Stand Sit to Stand: Min assist Stand pivot transfers: Mod assist General transfer comment: Min A for steadying assist to stand with RW. Heavy reliance on UEs once standing.  Ambulation/Gait Ambulation/Gait assistance: Mod assist Gait Distance (Feet): 20 Feet Assistive device: Rolling walker (2 wheeled) Gait Pattern/deviations: Step-to pattern, Step-through pattern, Ataxic, Staggering left, Staggering right General Gait Details: Worked on multiple bouts of forward and backward walking and focused on controlled steps with RLE. Cues for heel to toe gait pattern and looking for target on floor when taking steps to help with control. Pt remains VERY unsteady, especially during backwards walking.  Gait velocity: Decreased    ADL: ADL Overall ADL's : Needs assistance/impaired Eating/Feeding: Minimal assistance Eating/Feeding Details (indicate cue type and reason): to cut, open  containers and with items such as soup Grooming: Wash/dry hands,  Wash/dry face, Oral care, Applying deodorant, Set up Upper Body Bathing: Minimal assistance, Sitting Lower Body Bathing: Maximal assistance, Sit to/from stand Lower Body Bathing Details (indicate cue type and reason): pt requires signficant assistance for dynamic standing balance Upper Body Dressing : Minimal assistance Lower Body Dressing: Maximal assistance Toilet Transfer: Moderate assistance, +2 for physical assistance Toilet Transfer Details (indicate cue type and reason): mod a x2 due to signficant ataxia RLE greater than LLE.  Pt with several LOB during basic mobility.  Hand held assist x2 Toileting- Clothing Manipulation and Hygiene: Moderate assistance Functional mobility during ADLs: +2 for physical assistance, Moderate assistance General ADL Comments: mod a x2 hand held (pt would not be able to control walker at this time)   Cognition: Cognition Overall Cognitive Status: Within Functional Limits for tasks assessed Arousal/Alertness: Awake/alert Orientation Level: Oriented X4 Attention: Focused, Sustained Focused Attention: Appears intact(Vigilance WNL: 1/1) Sustained Attention: Appears intact(Serial 7s: 3/3) Memory: Impaired Memory Impairment: Retrieval deficit, Decreased recall of new information(Immediate: 4/5; Delayed: 2/5; with cues: 3/3 ) Awareness: Appears intact Problem Solving: Appears intact(5/5) Executive Function: Reasoning, Sequencing, Organizing Reasoning: Appears intact(2/2) Sequencing: Appears intact(Clock drawing: 3/3) Organizing: Appears intact(Backward digit span: 1/1) Cognition Arousal/Alertness: Awake/alert Behavior During Therapy: WFL for tasks assessed/performed Overall Cognitive Status: Within Functional Limits for tasks assessed General Comments: AxOx4 motivated and eager "get better"     Blood pressure (!) 121/46, pulse 73, temperature 98.3 F (36.8 C), temperature source Oral,  resp. rate 17, height 6' (1.829 m), weight 95.3 kg, SpO2 96 %. Physical Exam  Nursing note and vitals reviewed. Constitutional: He is oriented to person, place, and time. He appears well-developed and well-nourished. No distress. Nasal cannula in place.  HENT:  Head: Normocephalic and atraumatic.  Eyes: Pupils are equal, round, and reactive to light. EOM are normal.  Neck: Normal range of motion. No tracheal deviation present. No thyromegaly present.  Cardiovascular: Normal rate and regular rhythm. Exam reveals no gallop and no friction rub.  No murmur heard. Respiratory: Effort normal. No respiratory distress. He has no wheezes.  GI: Soft. He exhibits no distension. There is no abdominal tenderness.  Neurological: He is alert and oriented to person, place, and time. He displays normal reflexes. No cranial nerve deficit. He exhibits normal muscle tone. Coordination abnormal.  Mild right facial weakness with anarthria/mild dysarthria. Able to follow commands without difficulty. Normal insight and awareness. Normal language.  Ataxia RUE>RLE with FTN and HTS. Strength 4+/5 RUE and RLE and 5/5 LUE and LLE. No sensory findings.   Skin: Skin is warm and dry. He is not diaphoretic.  Psychiatric: He has a normal mood and affect. His behavior is normal. Judgment and thought content normal.      Lab Results Last 48 Hours  Results for orders placed or performed during the hospital encounter of 03/15/19 (from the past 48 hour(s))  Glucose, capillary     Status: Abnormal    Collection Time: 03/17/19 11:25 AM  Result Value Ref Range    Glucose-Capillary 153 (H) 70 - 99 mg/dL  Glucose, capillary     Status: Abnormal    Collection Time: 03/17/19  4:19 PM  Result Value Ref Range    Glucose-Capillary 158 (H) 70 - 99 mg/dL  Glucose, capillary     Status: Abnormal    Collection Time: 03/17/19  8:57 PM  Result Value Ref Range    Glucose-Capillary 191 (H) 70 - 99 mg/dL    Comment 1 Notify RN  Comment  2 Document in Chart    Basic metabolic panel     Status: Abnormal    Collection Time: 03/18/19  4:32 AM  Result Value Ref Range    Sodium 140 135 - 145 mmol/L    Potassium 4.8 3.5 - 5.1 mmol/L    Chloride 106 98 - 111 mmol/L    CO2 24 22 - 32 mmol/L    Glucose, Bld 191 (H) 70 - 99 mg/dL    BUN 32 (H) 8 - 23 mg/dL    Creatinine, Ser 1.51 (H) 0.61 - 1.24 mg/dL    Calcium 8.8 (L) 8.9 - 10.3 mg/dL    GFR calc non Af Amer 46 (L) >60 mL/min    GFR calc Af Amer 54 (L) >60 mL/min    Anion gap 10 5 - 15      Comment: Performed at [facility] [address], [place] [address]  CBC     Status: None    Collection Time: 03/18/19  4:32 AM  Result Value Ref Range    WBC 8.6 4.0 - 10.5 K/uL    RBC 5.08 4.22 - 5.81 MIL/uL    Hemoglobin 13.5 13.0 - 17.0 g/dL    HCT 43.3 39.0 - 52.0 %    MCV 85.2 80.0 - 100.0 fL    MCH 26.6 26.0 - 34.0 pg    MCHC 31.2 30.0 - 36.0 g/dL    RDW 13.9 11.5 - 15.5 %    Platelets 228 150 - 400 K/uL    nRBC 0.0 0.0 - 0.2 %      Comment: Performed at [facility], [place] [address], [place], [place] [address]  Magnesium     Status: None    Collection Time: 03/18/19  4:32 AM  Result Value Ref Range    Magnesium 1.9 1.7 - 2.4 mg/dL      Comment: Performed at [facility] [address], [place], [place] [address]  Glucose, capillary     Status: Abnormal    Collection Time: 03/18/19  6:07 AM  Result Value Ref Range    Glucose-Capillary 167 (H) 70 - 99 mg/dL  Glucose, capillary     Status: Abnormal    Collection Time: 03/18/19 11:49 AM  Result Value Ref Range    Glucose-Capillary 212 (H) 70 - 99 mg/dL  Glucose, capillary     Status: Abnormal    Collection Time: 03/18/19  4:05 PM  Result Value Ref Range    Glucose-Capillary 150 (H) 70 - 99 mg/dL  Glucose, capillary     Status: Abnormal    Collection Time: 03/18/19  9:07 PM  Result Value Ref Range    Glucose-Capillary 244 (H) 70 - 99 mg/dL  CBC     Status: Abnormal    Collection Time:  03/19/19  3:59 AM  Result Value Ref Range    WBC 8.6 4.0 - 10.5 K/uL    RBC 4.69 4.22 - 5.81 MIL/uL    Hemoglobin 12.3 (L) 13.0 - 17.0 g/dL    HCT 40.1 39.0 - 52.0 %    MCV 85.5 80.0 - 100.0 fL    MCH 26.2 26.0 - 34.0 pg    MCHC 30.7 30.0 - 36.0 g/dL    RDW 14.1 11.5 - 15.5 %    Platelets 218 150 - 400 K/uL    nRBC 0.0 0.0 - 0.2 %      Comment: Performed at [facility], [address] [place], [place] [address]  Glucose, capillary     Status: Abnormal    Collection Time: 03/19/19  6:08 AM  Result Value Ref Range    Glucose-Capillary 148 (H) 70 - 99 mg/dL  Glucose, capillary     Status: Abnormal    Collection Time: 03/19/19  7:48 AM  Result Value Ref Range    Glucose-Capillary 156 (H) 70 - 99 mg/dL  Basic metabolic panel     Status: Abnormal    Collection Time: 03/19/19  8:19 AM  Result Value Ref Range    Sodium 141 135 - 145 mmol/L    Potassium 4.4 3.5 - 5.1 mmol/L    Chloride 106 98 - 111 mmol/L    CO2 26 22 - 32 mmol/L    Glucose, Bld 156 (H) 70 - 99 mg/dL    BUN 39 (H) 8 - 23 mg/dL    Creatinine, Ser 1.64 (H) 0.61 - 1.24 mg/dL    Calcium 8.7 (L) 8.9 - 10.3 mg/dL    GFR calc non Af Amer 42 (L) >60 mL/min    GFR calc Af Amer 49 (L) >60 mL/min    Anion gap 9 5 - 15      Comment: Performed at [facility] [address] [place], [place] [address]       Imaging Results (Last 48 hours)  Ct Chest W Contrast   Result Date: 03/17/2019 CLINICAL DATA:  Squamous cell carcinoma the lungs. Right cerebellar stroke. EXAM: CT CHEST, ABDOMEN, AND PELVIS WITH CONTRAST TECHNIQUE: Multidetector CT imaging of the chest, abdomen and pelvis was performed following the standard protocol during bolus administration of intravenous contrast. CONTRAST:  41mL OMNIPAQUE IOHEXOL 300 MG/ML  SOLN COMPARISON:  Chest CT from 06/04/2018 and PET-CT from 08/15/2017 FINDINGS: CT CHEST FINDINGS Cardiovascular: Coronary, aortic arch, and branch vessel atherosclerotic vascular disease. Mild  cardiomegaly. Interventricular septal thickening suggesting left ventricular hypertrophy. Mediastinum/Nodes: Right paratracheal node 1.0 cm in short axis on image 19/3, formerly 0.6 cm. Additional small right paratracheal lymph nodes are below 1 cm in diameter but mildly increased from prior exam. Subcarinal node 1.1 cm in short axis on image 30/3, formerly 0.5 cm. Bilateral gynecomastia. Lungs/Pleura: Small right pleural effusion. There is complete occlusion of the right upper lobe bronchus. There is atelectasis and possibly some mild alveolar filling airspace opacity posteriorly in the right upper lobe, with aerated anterior portion of the right upper lobe. Airway thickening along the bronchus intermedius resulting in some luminal narrowing, with airway thickening centrally in the right middle lobe and right lower lobe. Superior segment right lower lobe airspace opacity on image 67/5 could be from prior radiation therapy though infection is not excluded. Fiducials are noted in the superior segment right lower lobe. Further inferiorly in the vicinity of the prior pulmonary nodule, there is indistinct patchy opacity which could be from radiation pneumonitis or pneumonia, for example as shown on image 93/5. The original nodule is not readily apparent against this background density. There is some associated surrounding ground-glass opacity. There is some faint nodularity peripherally in the left upper lobe. On image 62/5, a triangular 0.4 by 0.3 cm nodule is new or significantly increased from prior. Indistinct ground-glass density nodule in the left upper lobe measuring 0.9 cm in diameter on image 78/5 is new. Subsegmental atelectasis is present anteriorly in left lower lobe. Small fiducials are present anteriorly in the left lower lobe and there are bandlike opacities along the left hemidiaphragm with crowding of  bronchovascular structures suggesting some degree of volume loss. The lobulated nodule along the  inferior pulmonary ligament is somewhat obscured by surrounding atelectasis, for example on image 114/5, but is less sharply defined and possibly less prominent. There is some additional nodularity in this vicinity, for example posteriorly on image 112/5 where there is a 1.7 by 1.3 cm ill-defined nodular density, probably inflammatory. Musculoskeletal: Thoracic spondylosis. CT ABDOMEN PELVIS FINDINGS Hepatobiliary: Cholecystectomy. The common bile duct measures 1.1 cm in diameter, not changed from 08/15/2017, probably a physiologic response to cholecystectomy. No focal liver lesion is identified. Pancreas: Unremarkable Spleen: Unremarkable Adrenals/Urinary Tract: 1.7 by 1.8 cm left adrenal mass is unchanged in size from 08/15/2017 and was not previously hypermetabolic, hence likely benign. Small hypodense lesions of the left mid kidney right kidney upper pole probably cysts but technically too small to characterize. Considerable vascular calcifications along the renal hila bilaterally. No definite urinary tract calculi. Stomach/Bowel: Unremarkable Vascular/Lymphatic: Aortoiliac atherosclerotic vascular disease. No pathologic adenopathy identified. Reproductive: Faint calcification in the right posterior prostate gland. Other: No supplemental non-categorized findings. Musculoskeletal: Incidental lipoma in the right vastus lateralis muscle. Degenerative disc disease at L4-5. IMPRESSION: 1. Both the right lower lobe pulmonary nodule on the left lower lobe pulmonary nodule are reduced in conspicuity with some degree of surrounding airspace opacity, possibly radiation pneumonitis if the patient has had interval radiation therapy. 2. Complete occlusion of the right upper lobe bronchus. Most of the right upper lobe is collapsed potentially with a small amount of airspace filling process; there is air trapping in portions of the right upper lobe anteriorly. 3. Airway thickening centrally in the right lung, resulting in some  luminal narrowing of the bronchus intermedius. 4. There is a new 4 mm triangular-shaped left upper lobe pulmonary nodule along with a new small ground-glass density nodule in the left upper lobe which merit surveillance. 5. Mildly enlarged paratracheal and subcarinal lymph nodes likewise merits surveillance although could be reactive. 6. Other imaging findings of potential clinical significance: Aortic Atherosclerosis (ICD10-I70.0). Coronary atherosclerosis. Mild cardiomegaly with suggestion of left ventricular hypertrophy. Small right pleural effusion. Chronically stable small left adrenal mass, not previously hypermetabolic on PET-CT and hence likely benign. Electronically Signed   By: Van Clines M.D.   On: 03/17/2019 15:11    Ct Abdomen Pelvis W Contrast   Result Date: 03/17/2019 CLINICAL DATA:  Squamous cell carcinoma the lungs. Right cerebellar stroke. EXAM: CT CHEST, ABDOMEN, AND PELVIS WITH CONTRAST TECHNIQUE: Multidetector CT imaging of the chest, abdomen and pelvis was performed following the standard protocol during bolus administration of intravenous contrast. CONTRAST:  69mL OMNIPAQUE IOHEXOL 300 MG/ML  SOLN COMPARISON:  Chest CT from 06/04/2018 and PET-CT from 08/15/2017 FINDINGS: CT CHEST FINDINGS Cardiovascular: Coronary, aortic arch, and branch vessel atherosclerotic vascular disease. Mild cardiomegaly. Interventricular septal thickening suggesting left ventricular hypertrophy. Mediastinum/Nodes: Right paratracheal node 1.0 cm in short axis on image 19/3, formerly 0.6 cm. Additional small right paratracheal lymph nodes are below 1 cm in diameter but mildly increased from prior exam. Subcarinal node 1.1 cm in short axis on image 30/3, formerly 0.5 cm. Bilateral gynecomastia. Lungs/Pleura: Small right pleural effusion. There is complete occlusion of the right upper lobe bronchus. There is atelectasis and possibly some mild alveolar filling airspace opacity posteriorly in the right upper  lobe, with aerated anterior portion of the right upper lobe. Airway thickening along the bronchus intermedius resulting in some luminal narrowing, with airway thickening centrally in the right middle lobe and right lower lobe. Superior  segment right lower lobe airspace opacity on image 67/5 could be from prior radiation therapy though infection is not excluded. Fiducials are noted in the superior segment right lower lobe. Further inferiorly in the vicinity of the prior pulmonary nodule, there is indistinct patchy opacity which could be from radiation pneumonitis or pneumonia, for example as shown on image 93/5. The original nodule is not readily apparent against this background density. There is some associated surrounding ground-glass opacity. There is some faint nodularity peripherally in the left upper lobe. On image 62/5, a triangular 0.4 by 0.3 cm nodule is new or significantly increased from prior. Indistinct ground-glass density nodule in the left upper lobe measuring 0.9 cm in diameter on image 78/5 is new. Subsegmental atelectasis is present anteriorly in left lower lobe. Small fiducials are present anteriorly in the left lower lobe and there are bandlike opacities along the left hemidiaphragm with crowding of bronchovascular structures suggesting some degree of volume loss. The lobulated nodule along the inferior pulmonary ligament is somewhat obscured by surrounding atelectasis, for example on image 114/5, but is less sharply defined and possibly less prominent. There is some additional nodularity in this vicinity, for example posteriorly on image 112/5 where there is a 1.7 by 1.3 cm ill-defined nodular density, probably inflammatory. Musculoskeletal: Thoracic spondylosis. CT ABDOMEN PELVIS FINDINGS Hepatobiliary: Cholecystectomy. The common bile duct measures 1.1 cm in diameter, not changed from 08/15/2017, probably a physiologic response to cholecystectomy. No focal liver lesion is identified. Pancreas:  Unremarkable Spleen: Unremarkable Adrenals/Urinary Tract: 1.7 by 1.8 cm left adrenal mass is unchanged in size from 08/15/2017 and was not previously hypermetabolic, hence likely benign. Small hypodense lesions of the left mid kidney right kidney upper pole probably cysts but technically too small to characterize. Considerable vascular calcifications along the renal hila bilaterally. No definite urinary tract calculi. Stomach/Bowel: Unremarkable Vascular/Lymphatic: Aortoiliac atherosclerotic vascular disease. No pathologic adenopathy identified. Reproductive: Faint calcification in the right posterior prostate gland. Other: No supplemental non-categorized findings. Musculoskeletal: Incidental lipoma in the right vastus lateralis muscle. Degenerative disc disease at L4-5. IMPRESSION: 1. Both the right lower lobe pulmonary nodule on the left lower lobe pulmonary nodule are reduced in conspicuity with some degree of surrounding airspace opacity, possibly radiation pneumonitis if the patient has had interval radiation therapy. 2. Complete occlusion of the right upper lobe bronchus. Most of the right upper lobe is collapsed potentially with a small amount of airspace filling process; there is air trapping in portions of the right upper lobe anteriorly. 3. Airway thickening centrally in the right lung, resulting in some luminal narrowing of the bronchus intermedius. 4. There is a new 4 mm triangular-shaped left upper lobe pulmonary nodule along with a new small ground-glass density nodule in the left upper lobe which merit surveillance. 5. Mildly enlarged paratracheal and subcarinal lymph nodes likewise merits surveillance although could be reactive. 6. Other imaging findings of potential clinical significance: Aortic Atherosclerosis (ICD10-I70.0). Coronary atherosclerosis. Mild cardiomegaly with suggestion of left ventricular hypertrophy. Small right pleural effusion. Chronically stable small left adrenal mass, not  previously hypermetabolic on PET-CT and hence likely benign. Electronically Signed   By: Van Clines M.D.   On: 03/17/2019 15:11    Vas [place] Transcranial Doppler W Bubbles   Result Date: 03/18/2019  Transcranial Doppler with Bubble Indications: Stroke. Performing Technologist: Abram Sander RVS  Examination Guidelines: A complete evaluation includes B-mode imaging, spectral Doppler, color Doppler, and power Doppler as needed of all accessible portions of each vessel. Bilateral testing is considered  an integral part of a complete examination. Limited examinations for reoccurring indications may be performed as noted.  Summary:  A vascular evaluation was performed. The left middle cerebral artery was studied. An IV was inserted into the patient's right forearm. Verbal informed consent was obtained.  Interpretation: HITS heard at rest: None HITS heard during valsalva: None *See table(s) above for measurements and observations.    Preliminary              Medical Problem List and Plan: 1.  Functional and mobility deficits secondary to right cerebellar infarct             -admit to inpatient rehab 2.  Antithrombotics: -Acute left DVT/PE dx-10/2018/anticoagulation:  Pharmaceutical: Other (comment)--Eliquis treatment dose X 2 weeks followed by maintaince dose.              -antiplatelet therapy: ASA 3. Pain Management: N/A 4. Mood: LCSW to follow for evaluation and support.              -antipsychotic agents: N/A 5. Neuropsych: This patient is capable of making decisions on his own behalf. 6. Skin/Wound Care: Routine pressure relief measures.  7. Fluids/Electrolytes/Nutrition: Monitor I/O. Check lytes in am.  8. HTN: Monitor BP tid. Continue Benazepril, Metoprolol and Amlodipine. Lasix on hold.  9. CKD Stage III: Baseline SCr around 2.2. BUN/SCr- 53/2.05 at admission--now down to 39/1.64. Avoid nephrotoxic medications. Avoid hypoperfusion post-stroke.  10. Nausea/Vomiting: Denies symptoms at  present 11. CAD/PVD: On low dose ASA, Lipitor, Eliquis and BB 12. T2DM: Monitor BS ac/hs --Was on Tujeo 300- 27 units daily with humalog 11 units bid lunch/supper. Continue SSI for now as BS controlled but not eating as much. Will order double portions of protein/veggies.  13. Squamous cell lung cancer: S/p XRT 14. Recent PNA/Hypoxia:  Was completing course of Levaquin at admission. No respiratiory symptoms or leukocytosis presently. Question aspiration event at admission with RUL changes             -continued use of flutter valve.               -doxycycline bid day# 2      Post Admission Physician Evaluation: 1. Functional deficits secondary  to right cerebellar infarct. 2. Patient is admitted to receive collaborative, interdisciplinary care between the physiatrist, rehab nursing staff, and therapy team. 3. Patient's level of medical complexity and substantial therapy needs in context of that medical necessity cannot be provided at a lesser intensity of care such as a SNF. 4. Patient has experienced substantial functional loss from his/her baseline which was documented above under the "Functional History" and "Functional Status" headings.  Judging by the patient's diagnosis, physical exam, and functional history, the patient has potential for functional progress which will result in measurable gains while on inpatient rehab.  These gains will be of substantial and practical use upon discharge  in facilitating mobility and self-care at the household level. 5. Physiatrist will provide 24 hour management of medical needs as well as oversight of the therapy plan/treatment and provide guidance as appropriate regarding the interaction of the two. 6. The Preadmission Screening has been reviewed and patient status is unchanged unless otherwise stated above. 7. 24 hour rehab nursing will assist with bladder management, bowel management, safety, skin/wound care, disease management, medication administration,  pain management and patient education  and help integrate therapy concepts, techniques,education, etc. 8. PT will assess and treat for/with: Lower extremity strength, range of motion, stamina, balance, functional mobility, safety, adaptive  techniques and equipment, NMR.   Goals are: mod I to supervision. 9. OT will assess and treat for/with: ADL's, functional mobility, safety, upper extremity strength, adaptive techniques and equipment, NMR.   Goals are: mod I to supervision. Therapy may proceed with showering this patient. 10. SLP will assess and treat for/with: speech, communication.  Goals are: mod I. 11. Case Management and Social Worker will assess and treat for psychological issues and discharge planning. 12. Team conference will be held weekly to assess progress toward goals and to determine barriers to discharge. 13. Patient will receive at least 3 hours of therapy per day at least 5 days per week. 14. ELOS: 9-12 days       15. Prognosis:  excellent   I have personally performed a face to face diagnostic evaluation of this patient and formulated the key components of the plan.  Additionally, I have personally reviewed laboratory data, imaging studies, as well as relevant notes and concur with the physician assistant's documentation above.  Meredith Staggers, MD, [place], PA-C 03/19/2019

## 2019-03-19 NOTE — Progress Notes (Signed)
Inpatient Rehabilitation Admissions Coordinator  I met with Dr. Berle Mull and Dr. Erlinda Hong and pt to be d/c'd to CIR today. I met with patient at bedside and pt is in agreement. I will make the arrangements to admit today. RN CM, Hassan Rowan and SW, Kathlee Nations made aware.  Danne Baxter, RN, MSN Rehab Admissions Coordinator [phone] 03/19/2019 12:10 PM

## 2019-03-19 NOTE — Progress Notes (Signed)
Inpatient Diabetes Program Recommendations  AACE/ADA: New Consensus Statement on Inpatient Glycemic Control (2015)  Target Ranges:  Prepandial:   less than 140 mg/dL      Peak postprandial:   less than 180 mg/dL (1-2 hours)      Critically ill patients:  140 - 180 mg/dL   Results for [facility]" (MRN [id number]) as of 03/19/2019 09:05  Ref. Range 03/18/2019 06:07 03/18/2019 11:49 03/18/2019 16:05 03/18/2019 21:07  Glucose-Capillary Latest Ref Range: 70 - 99 mg/dL 167 (H)  REFUSED Novolog 212 (H)  5 units NOVOLOG  150 (H)  2 units NOVOLOG  244 (H)  2 units NOVOLOG    Results for [facility], [facility]" (MRN [id number]) as of 03/19/2019 09:05  Ref. Range 03/19/2019 07:48  Glucose-Capillary Latest Ref Range: 70 - 99 mg/dL 156 (H)  2 units NOVOLOG      Home DM Meds: Toujeo 27 units Daily       Humalog 11 units BID with Lunch and Dinner  Current Orders: Novolog Moderate Correction Scale/ SSI (0-15 units) TID AC + HS     MD- Please consider the following in-hospital insulin adjustments:  1. Start Lantus 6 units QHS (25% total home dose basal insulin)   2. Start low dose Novolog Meal Coverage: Novolog 3 units TID with meals  (Please add the following Hold Parameters: Hold if pt eats <50% of meal, Hold if pt NPO)     --Will follow patient during hospitalization--  Wyn Quaker RN, MSN, CDE Diabetes Coordinator Inpatient Glycemic Control Team Team Pager: [phone] (8a-5p)

## 2019-03-20 ENCOUNTER — Inpatient Hospital Stay (HOSPITAL_COMMUNITY): Payer: Medicare Other | Admitting: Occupational Therapy

## 2019-03-20 ENCOUNTER — Inpatient Hospital Stay (HOSPITAL_COMMUNITY): Payer: Medicare Other | Admitting: Speech Pathology

## 2019-03-20 ENCOUNTER — Inpatient Hospital Stay (HOSPITAL_COMMUNITY): Payer: Medicare Other | Admitting: Physical Therapy

## 2019-03-20 LAB — GLUCOSE, CAPILLARY
Glucose-Capillary: 167 mg/dL — ABNORMAL HIGH (ref 70–99)
Glucose-Capillary: 154 mg/dL — ABNORMAL HIGH (ref 70–99)
Glucose-Capillary: 187 mg/dL — ABNORMAL HIGH (ref 70–99)
Glucose-Capillary: 162 mg/dL — ABNORMAL HIGH (ref 70–99)
Glucose-Capillary: 248 mg/dL — ABNORMAL HIGH (ref 70–99)

## 2019-03-20 MED ORDER — INSULIN GLARGINE 100 UNIT/ML ~~LOC~~ SOLN
5.0000 [IU] | Freq: Every day | SUBCUTANEOUS | Status: DC
  Administered 2019-03-20 – 2019-03-27 (×8): 5 [IU] via SUBCUTANEOUS
  Filled 2019-03-20 (×9): qty 0.05

## 2019-03-20 NOTE — Progress Notes (Signed)
PHYSICAL MEDICINE & REHABILITATION PROGRESS NOTE   Subjective/Complaints: Had a pretty good night. Already up with SLP ROS: Patient denies fever, rash, sore throat, blurred vision, nausea, vomiting, diarrhea, cough, shortness of breath or chest pain, joint or back pain, headache, or mood change.    Objective:   Vas [place] Transcranial Doppler W Bubbles  Result Date: 03/18/2019  Transcranial Doppler with Bubble Indications: Stroke. Performing Technologist: Abram Sander RVS  Examination Guidelines: A complete evaluation includes B-mode imaging, spectral Doppler, color Doppler, and power Doppler as needed of all accessible portions of each vessel. Bilateral testing is considered an integral part of a complete examination. Limited examinations for reoccurring indications may be performed as noted.  Summary:  A vascular evaluation was performed. The left middle cerebral artery was studied. An IV was inserted into the patient's right forearm. Verbal informed consent was obtained.  Interpretation: HITS heard at rest: None HITS heard during valsalva: None *See table(s) above for measurements and observations.    Preliminary    Recent Labs    03/18/19 0432 03/19/19 0359  WBC 8.6 8.6  HGB 13.5 12.3*  HCT 43.3 40.1  PLT 228 218   Recent Labs    03/18/19 0432 03/19/19 0819  NA 140 141  K 4.8 4.4  CL 106 106  CO2 24 26  GLUCOSE 191* 156*  BUN 32* 39*  CREATININE 1.51* 1.64*  CALCIUM 8.8* 8.7*    Intake/Output Summary (Last 24 hours) at 03/20/2019 1139 Last data filed at 03/20/2019 0700 Gross per 24 hour  Intake 773 ml  Output 975 ml  Net -202 ml     Physical Exam: Vital Signs Blood pressure (!) 149/54, pulse 75, temperature 98.6 F (37 C), temperature source Oral, resp. rate 18, weight 92.5 kg, SpO2 95 %. Constitutional: No distress . Vital signs reviewed. HEENT: EOMI, oral membranes moist Neck: supple Cardiovascular: RRR without murmur. No JVD    Respiratory: CTA  Bilaterally without wheezes or rales. Normal effort    GI: BS +, non-tender, non-distended  Neurological: He isalertand oriented to person, place, and time. He displaysnormal reflexes. Nocranial nerve deficit. He exhibitsnormal muscle tone.Coordinationabnormal.  Mild right facial weakness with anarthria/mild dysarthria ongoing. Reasonable insight and awareness. Normal language. Ataxia RUE>RLE with FTN and HTS. Strength 4+/5 RUE and RLE and 5/5 LUE and LLE. No sensory findings.  Skin: Skin iswarmand dry. He isnot diaphoretic.  Psychiatric: He has anormal mood and affect. Hisbehavior is normal.Judgmentand thought contentnormal.     Assessment/Plan: 1. Functional deficits secondary to right cerebellar infarct which require 3+ hours per day of interdisciplinary therapy in a comprehensive inpatient rehab setting.  Physiatrist is providing close team supervision and 24 hour management of active medical problems listed below.  Physiatrist and rehab team continue to assess barriers to discharge/monitor patient progress toward functional and medical goals  Care Tool:  Bathing              Bathing assist       Upper Body Dressing/Undressing Upper body dressing   What is the patient wearing?: Hospital gown only    Upper body assist Assist Level: Minimal Assistance - Patient > 75%    Lower Body Dressing/Undressing Lower body dressing            Lower body assist Assist for lower body dressing: Contact Guard/Touching assist     Toileting Toileting    Toileting assist Assist for toileting: Contact Guard/Touching assist     Transfers Chair/bed transfer  Transfers  assist           Locomotion Ambulation   Ambulation assist              Walk 10 feet activity   Assist           Walk 50 feet activity   Assist           Walk 150 feet activity   Assist           Walk 10 feet on uneven surface  activity   Assist            Wheelchair     Assist               Wheelchair 50 feet with 2 turns activity    Assist            Wheelchair 150 feet activity     Assist          Medical Problem List and Plan: 1.Functional and mobility deficitssecondary to right cerebellar infarct --Patient is beginning CIR therapies today including PT, OT, and SLP  2. Antithrombotics: -Acute leftDVT/PE dx-10/2018/anticoagulation:Pharmaceutical:Other (comment)--Eliquis treatment dose X 2 weeks followed by maintaince dose. -antiplatelet therapy: ASA 3. Pain Management:N/A 4. Mood:LCSW to follow for evaluation and support. -antipsychotic agents: N/A 5. Neuropsych: This patientiscapable of making decisions on hisown behalf. 6. Skin/Wound Care:Routine pressure relief measures. 7. Fluids/Electrolytes/Nutrition:Monitor I/O. Check lytes in am.  8. HTN: Monitor BP tid. Continue Benazepril, Metoprolol and Amlodipine. Lasix on hold.   -fair control 5/9 9. CKD Stage III: Baseline SCr around 2.2. BUN/SCr- 53/2.05 at admission--now down to 39/1.64. Avoid nephrotoxic medications.   -Avoid hypoperfusionpost-stroke.  10. Nausea/Vomiting:Denies symptoms at present 11. CAD/PVD: On low dose ASA, Lipitor, Eliquis and BB 12. T2DM: Monitor BS ac/hs --Was on Tujeo 300- 27 units daily with humalog 11 units bid lunch/supper. Continue SSI for now  -fair control  -intake has improved  -begin lantus 5 units daily in place of Tujeo 13. Squamous cell lung cancer: S/p XRT 14. Recent PNA/Hypoxia:Was completing course of Levaquin at admission. No respiratiory symptoms or leukocytosispresently. Question aspiration event at admission with RUL changes -continued use of flutter valve. -doxycycline bid day# 3  -lungs sound clear    LOS: 1 days A FACE TO [place] 03/20/2019, 11:39 AM

## 2019-03-20 NOTE — Evaluation (Signed)
Speech Language Pathology Assessment and Plan  Patient Details  Name: Travis [name] MRN: [id number] Date of Birth: 1951/04/18  SLP Diagnosis: Dysarthria  Rehab Potential: Good ELOS: 8 to 10 days    Today's Date: 03/20/2019 SLP Individual Time: 0730-0830 SLP Individual Time Calculation (min): 60 min   Problem List:  Patient Active Problem List   Diagnosis Date Noted  . Cerebellar stroke ([place]) 03/19/2019  . CVA (cerebral vascular accident) ([place]) 03/15/2019  . Nausea & vomiting 03/15/2019  . Squamous cell carcinoma of lungs, bilateral ([place]) 06/11/2018  . Flexor tendon laceration, finger, open wound 07/29/2012  . PVD 12/08/2009  . DIAB W/RENAL MANIFESTS TYPE I [JUV] NOT UNCNTRL 09/08/2009  . HYPERCHOLESTEROLEMIA 09/08/2009  . TOBACCO ABUSE 09/08/2009  . Essential hypertension, benign 09/08/2009  . CORONARY ATHEROSCLEROSIS NATIVE CORONARY ARTERY 09/08/2009  . CKD (chronic kidney disease) stage 3, GFR 30-59 ml/min ([place]) 09/08/2009   Past Medical History:  Past Medical History:  Diagnosis Date  . CAD (coronary artery disease)   . Cancer ([place])    skin cancer face  . COPD (chronic obstructive pulmonary disease) ([place])   . Coronary artery disease    PCA 1994l; PCI august 2003 of an obtust marginal. Catheterization at the time reveal: left main normal; LAD 50-75% between first and second diagonals, 50% after lesion; left circumflex 95% in obtuse marg. RCA 100% proximal.  . Diabetes mellitus    type 2  . Hyperlipemia   . Hypertension   . Kidney disease    chronic; stage 2 followed By Dr Deterding  . Myocardial infarction ([place])   . Osteoarthritis   . Peripheral vascular disease ([place])   . Tobacco abuse    Past Surgical History:  Past Surgical History:  Procedure Laterality Date  . APPENDECTOMY    . CHOLECYSTECTOMY    . CORONARY ANGIOPLASTY     stent times 2, last cath 3 years ago  . FUDUCIAL PLACEMENT N/A 06/09/2018   Procedure: possible, PLACEMENT OF FUDUCIAL;  Surgeon:  Grace Isaac, MD;  Location: [facility];  Service: Thoracic;  Laterality: N/A;  . NERVE EXPLORATION  07/29/2012   Procedure: NERVE EXPLORATION;  Surgeon: Schuyler Amor, MD;  Location: [facility];  Service: Orthopedics;  Laterality: Left;  Exploration /repair nerve and tendon left thumb  . PERIPHERAL VASCULAR CATHETERIZATION N/A 04/26/2015   Procedure: Abdominal Aortogram;  Surgeon: Rosetta Posner, MD;  Location: [facility];  Service: Cardiovascular;  Laterality: N/A;  . VIDEO BRONCHOSCOPY N/A 06/09/2018   Procedure: VIDEO BRONCHOSCOPY;  Surgeon: Grace Isaac, MD;  Location: [facility];  Service: Thoracic;  Laterality: N/A;  . VIDEO BRONCHOSCOPY WITH ENDOBRONCHIAL NAVIGATION N/A 06/09/2018   Procedure: VIDEO BRONCHOSCOPY WITH ENDOBRONCHIAL NAVIGATION;  Surgeon: Grace Isaac, MD;  Location: [facility];  Service: Thoracic;  Laterality: N/A;    Assessment / Plan / Recommendation Clinical Impression ALPHA MYSLIWIEC is a 68 year old male with history of SCLC, CAD s/p PCA, T2DM, COPD, PE--on Eliquis,CKD III; who was admitted on 03/15/19 with nausea/vomiting that started night prior to admission, dizziness, speech changes and hypoxia. CT head revealed acute to subacute right cerebellar infarct without hemorrhage. MRI/MRA brain done revealing 3cm acute infarct in right superolateral cerebellum and moderate white matter disease in pons and cerebral white matter.CT abdomen/pelvis done to rule out metastatic disease and revealed RLL and LLE nodules to be reduced with question of surrounding radiation pneumonitis, complete occlusion of RUL bronchus with potential collapse of most of  the lobe with small amount of airspace filling process and new 4 mm LUL nodule and ground glass density as well as lymph node enlargement question reactive. He continues to [employer] withhypoxiabut respiratory status improving.Patient with resultant right hemiataxia, dysarthriaandanarthria affecting  functional status. CIR recommended for progressive therapy with admission on 03/19/19.   Speech-language assessment completed on 03/20/19. Pt is oriented x 4 and demonstrated appropriate cognitive functions. Additionally, the Cerebellar Cognitive Affective/Schmahmann Syndrome Scale  (CCAS-SCALE) was administered. Pt's ability was not indicative of CCAS. Currently pt presents with ataxic dysarthria that negatively impacts speech intelligibility at the sentence level. He is ~ 80% intelligibility at the spontaneous phrase level and ~ 50% at the spontaneous sentence level. Pt is able to recall previous introduced speech strategies with question cues and is able to implement briefly at the phrase level to achieve ~ 90% intelligiblility. Skilled ST is required to target speech intelligibility, increase functional independence and reduce caregiver burden. At this time anticipate that pt may benefit from follow up [place] to further speech intelligibility.    Skilled Therapeutic Interventions          Treatment session focused on completion of above mentioned assessment. Although pt was able to recall general speech intelligibility strategies, pt required Min A cues to increase speech intelligibility to ~90% at phrase level. Education provided on POC and pt agreeable to goals with all questions answered to pt's satisfaction.    SLP Assessment  Patient will need skilled [place] [facility] during CIR admission    Recommendations  Patient destination: Home Follow up Recommendations: Home Health SLP Equipment Recommended: None recommended by SLP    SLP Frequency 3 to 5 out of 7 days   SLP Duration  SLP Intensity  SLP Treatment/Interventions 8 to 10 days  Minumum of 1-2 x/day, 30 to 90 minutes  Speech/Language facilitation;Patient/family education    Pain Pain Assessment Pain Scale: 0-10 Pain Score: 0-No pain  Prior Functioning Cognitive/Linguistic Baseline: Within functional limits Type  of Home: House  Lives With: Spouse Available Help at Discharge: Available 24 hours/day Education: 12th grade Vocation: Retired  Industrial/product designer Term Goals: Week 1: SLP Short Term Goal 1 (Week 1): Given supervision cues, pt wil recall speech intelligibility strategies in 8 out of 10 opportunities.  SLP Short Term Goal 2 (Week 1): Given Min A cues, pt will utilize speech intelligibility strategies at the sentence level to achieve ~ 90% intelligibility.   Refer to Care Plan for Long Term Goals  Recommendations for other services: None   Discharge Criteria: Patient will be discharged from SLP if patient refuses treatment 3 consecutive times without medical reason, if treatment goals not met, if there is a change in medical status, if patient makes no progress towards goals or if patient is discharged from hospital.  The above assessment, treatment plan, treatment alternatives and goals were discussed and mutually agreed upon: by patient  Dorman Calderwood 03/20/2019, 11:46 AM

## 2019-03-20 NOTE — Evaluation (Signed)
Occupational Therapy Assessment and Plan  Patient Details  Name: Travis [name] MRN: [id number] Date of Birth: [birth year]  OT Diagnosis: ataxia, muscle weakness (generalized) and coordination disorder Rehab Potential: Rehab Potential (ACUTE ONLY): Excellent ELOS: 7-10 days   Today's Date: 03/20/2019 OT Individual Time: 9753-0051 OT Individual Time Calculation (min): 75 min     Problem List:  Patient Active Problem List   Diagnosis Date Noted  . Cerebellar stroke ([place]) 03/19/2019  . CVA (cerebral vascular accident) ([place]) 03/15/2019  . Nausea & vomiting 03/15/2019  . Squamous cell carcinoma of lungs, bilateral ([place]) 06/11/2018  . Flexor tendon laceration, finger, open wound 07/29/2012  . PVD 12/08/2009  . DIAB W/RENAL MANIFESTS TYPE I [JUV] NOT UNCNTRL 09/08/2009  . HYPERCHOLESTEROLEMIA 09/08/2009  . TOBACCO ABUSE 09/08/2009  . Essential hypertension, benign 09/08/2009  . CORONARY ATHEROSCLEROSIS NATIVE CORONARY ARTERY 09/08/2009  . CKD (chronic kidney disease) stage 3, GFR 30-59 ml/min ([place]) 09/08/2009    Past Medical History:  Past Medical History:  Diagnosis Date  . CAD (coronary artery disease)   . Cancer ([place])    skin cancer face  . COPD (chronic obstructive pulmonary disease) ([place])   . Coronary artery disease    PCA 1994l; PCI august 2003 of an obtust marginal. Catheterization at the time reveal: left main normal; LAD 50-75% between first and second diagonals, 50% after lesion; left circumflex 95% in obtuse marg. RCA 100% proximal.  . Diabetes mellitus    type 2  . Hyperlipemia   . Hypertension   . Kidney disease    chronic; stage 2 followed By Dr Deterding  . Myocardial infarction ([place])   . Osteoarthritis   . Peripheral vascular disease ([place])   . Tobacco abuse    Past Surgical History:  Past Surgical History:  Procedure Laterality Date  . APPENDECTOMY    . CHOLECYSTECTOMY    . CORONARY ANGIOPLASTY     stent times 2, last cath 3 years ago  . FUDUCIAL PLACEMENT  N/A 06/09/2018   Procedure: possible, PLACEMENT OF FUDUCIAL;  Surgeon: Grace Isaac, MD;  Location: [facility];  Service: Thoracic;  Laterality: N/A;  . NERVE EXPLORATION  07/29/2012   Procedure: NERVE EXPLORATION;  Surgeon: Schuyler Amor, MD;  Location: [facility];  Service: Orthopedics;  Laterality: Left;  Exploration /repair nerve and tendon left thumb  . PERIPHERAL VASCULAR CATHETERIZATION N/A 04/26/2015   Procedure: Abdominal Aortogram;  Surgeon: Rosetta Posner, MD;  Location: [facility];  Service: Cardiovascular;  Laterality: N/A;  . VIDEO BRONCHOSCOPY N/A 06/09/2018   Procedure: VIDEO BRONCHOSCOPY;  Surgeon: Grace Isaac, MD;  Location: [facility];  Service: Thoracic;  Laterality: N/A;  . VIDEO BRONCHOSCOPY WITH ENDOBRONCHIAL NAVIGATION N/A 06/09/2018   Procedure: VIDEO BRONCHOSCOPY WITH ENDOBRONCHIAL NAVIGATION;  Surgeon: Grace Isaac, MD;  Location: [facility];  Service: Thoracic;  Laterality: N/A;    Assessment & Plan Clinical Impression: Travis [name] is a 68 year old male with history of SCLC, CAD s/p PCA, T2DM, COPD, PE--on Eliquis,CKD III; who was admitted on 03/15/19 with nausea/vomiting that started night prior to admission, dizziness, speech changes and hypoxia. CT head revealed acute to subacute right cerebellar infarct without hemorrhage. MRI/MRA brain done revealing 3cm acute infarct in right superolateral cerebellum and moderate white matter disease in pons and cerebral white matter. No large vessel occlusion seen. 2 D echo showed EF 60-65% with dilated IVC and no interatrial shunt. Patient reported that symptoms occurred after severe coughing episode  and Dr. Erlinda Hong recommended CTA neck to rule out dissection of VA if SCr improves. Lasix held with improvement in SCr today.   BLE dopplers revealed acute DVT in left posterior tibial vein. Patient on ASA/Eliquis prior to admission and Eliquis increased to 10 mg bid for 14 days followed by home dose. CT  abdomen/pelvis done to rule out metastatic disease and revealed RLL and LLE nodules to be reduced with question of surrounding radiation pneumonitis, complete occlusion of RUL bronchus with potential collapse of most of the lobe with small amount of airspace filling process and new 4 mm LUL nodule and ground glass density as well as lymph node enlargement question reactive. He continues to [employer] withhypoxiabut respiratory status improving.Patient with resultant right hemiataxia, dysarthriaandanarthria affecting functional status. CIR recommended for progressive therapy.   Patient currently requires min with basic self-care skills secondary to muscle weakness, decreased cardiorespiratoy endurance, unbalanced muscle activation, ataxia and decreased coordination and decreased standing balance, decreased postural control and decreased balance strategies.  Prior to hospitalization, patient could complete BADLs with independent .  Patient will benefit from skilled intervention to increase independence with basic self-care skills prior to discharge home with spouse.  Anticipate patient will require intermittent supervision and follow up home health.  OT Assessment Rehab Potential (ACUTE ONLY): Excellent OT Barriers to Discharge: Medical stability;New oxygen OT Patient demonstrates impairments in the following area(s): Balance;Endurance;Motor;Safety OT Basic ADL's Functional Problem(s): Grooming;Bathing;Dressing;Toileting OT Advanced ADL's Functional Problem(s): Simple Meal Preparation OT Transfers Functional Problem(s): Toilet;Tub/Shower OT Additional Impairment(s): None OT Plan OT Intensity: Minimum of 1-2 x/day, 45 to 90 minutes OT Frequency: 5 out of 7 days OT Duration/Estimated Length of Stay: 7-10 days OT Treatment/Interventions: Balance/vestibular training;Disease mangement/prevention;Neuromuscular re-education;Self Care/advanced ADL retraining;Therapeutic Exercise;UE/LE Strength  taining/ROM;Pain management;DME/adaptive equipment instruction;Patient/family education;UE/LE Coordination activities;Therapeutic Activities;Psychosocial support;Functional mobility training;Discharge planning OT Self Feeding Anticipated Outcome(s): No goal OT Basic Self-Care Anticipated Outcome(s): Mod I  OT Toileting Anticipated Outcome(s): Mod I  OT Bathroom Transfers Anticipated Outcome(s): Mod I  OT Recommendation Recommendations for Other Services: Neuropsych consult Patient destination: Home Follow Up Recommendations: Home health OT Equipment Recommended: To be determined Skilled Therapeutic Intervention Skilled OT session completed with focus on initial evaluation, education on OT role/POC, and establishment of patient-centered goals.   Pt greeted in bed with no c/o pain. 02 sats 100% on supplemental 02. He requested to use the bathroom. Stand pivot transfer<BSC completed with Min A and he urinated while standing with steady assist. Once he returned EOB, pt completed bathing/dressing tasks at sit<stand level. He required overall Min A for dynamic standing balance without AD. Pt then ambulated short distance to chair placed at sink (still without device). Pt required Mod A due to ataxia LEs>UEs. While standing, pt completed oral care and handwashing with steady assist. Ambulatory toilet transfer completed using device with Min A. Post transfer 02 sats decreased to 85%, increasing to 90% in less than 10 seconds after sitting. Post transfer back to bed, 02 sats were 93%. He also completed the [address] Peg test, with a 30 second difference between hands (slower with the Rt). Discussed functional implications, including probable difficultly with buttons and tying shoelaces. He would benefit from practicing these tasks at [facility]. Pt then returned to bed and was left with all needs within reach and bed alarm set.   OT Evaluation Precautions/Restrictions  Precautions Precautions: Fall Precaution  Comments: Ataxic, LEs>UEs Restrictions Weight Bearing Restrictions: No General Chart Reviewed: Yes Family/Caregiver Present: No Pain Pain Assessment Pain Scale: 0-10 Pain  Score: 0-No pain Home Living/Prior Functioning Home Living Available Help at Discharge: Available 24 hours/day Type of Home: House Home Layout: One level Bathroom Shower/Tub: Multimedia programmer: Standard Bathroom Accessibility: Yes  Lives With: Spouse IADL History Homemaking Responsibilities: Yes Meal Prep Responsibility: Secondary Laundry Responsibility: Secondary Cleaning Responsibility: Secondary Shopping Responsibility: Secondary Homemaking Comments: IADL responsibilities shared with spouse Otila Kluver (pronounced Tie-na) Education: 12th grade Occupation: Retired 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, maintenance, Public house manager) Leisure and Hobbies: He and spouse enjoyed participating in [place]  Prior Function Level of Independence: Independent with basic ADLs, Independent with homemaking with ambulation, Independent with transfers Vocation: Retired ADL ADL Eating: Not assessed Grooming: Contact guard Where Assessed-Grooming: Standing at sink Upper Body Bathing: Supervision/safety Where Assessed-Upper Body Bathing: Edge of bed Lower Body Bathing: Minimal assistance Where Assessed-Lower Body Bathing: Edge of bed Upper Body Dressing: Supervision/safety Where Assessed-Upper Body Dressing: Edge of bed Lower Body Dressing: Minimal assistance Where Assessed-Lower Body Dressing: Edge of bed Toileting: Minimal assistance Where Assessed-Toileting: Bedside Commode Toilet Transfer: Minimal assistance Toilet Transfer Method: Counselling psychologist: Grab bars(RW) Tub/Shower Transfer: Not assessed Vision Baseline Vision/History: Wears glasses Wears Glasses: Reading only("occasionally") Vision Assessment?: No apparent visual deficits Perception   Perception: Within Functional Limits Praxis Praxis: Intact Cognition Overall Cognitive Status: Within Functional Limits for tasks assessed Arousal/Alertness: Awake/alert Orientation Level: Person;Place;Situation Person: Oriented Place: Oriented Situation: Oriented Year: 2020 Month: May Day of Week: Correct Attention: Sustained Sustained Attention: Appears intact Awareness: Appears intact Problem Solving: Appears intact Safety/Judgment: Appears intact Sensation Sensation Light Touch: Appears Intact(B UEs) Coordination Gross Motor Movements are Fluid and Coordinated: No Fine Motor Movements are Fluid and Coordinated: No Coordination and Movement Description: Ataxic, LEs>UEs Finger Nose Finger Test: Dysmetria bilaterally, Rt>Lt 9 Hole Peg Test: 64 seconds Rt, 33 seconds Lt Motor  Motor Motor: Ataxia Mobility     Trunk/Postural Assessment  Cervical Assessment Cervical Assessment: Exceptions to WFL(forward head) Thoracic Assessment Thoracic Assessment: Exceptions to WFL(rounded shoulders) Lumbar Assessment Lumbar Assessment: Exceptions to WFL(posterior pelvic tilt)  Balance Balance Balance Assessed: Yes Dynamic Sitting Balance Dynamic Sitting - Level of Assistance: 5: Stand by assistance(donning gripper socks EOB) Dynamic Standing Balance Dynamic Standing - Level of Assistance: 4: Min assist(Completing perihygiene without use of device) Extremity/Trunk Assessment RUE Assessment RUE Assessment: Within Functional Limits Active Range of Motion (AROM) Comments: WNL General Strength Comments: 4-/5 deltoids, 5/5 distally  LUE Assessment LUE Assessment: Within Functional Limits Active Range of Motion (AROM) Comments: WNL General Strength Comments: 4+/5 deltoids, 5/5 distally      Refer to Care Plan for Long Term Goals  Recommendations for other services: None    Discharge Criteria: Patient will be discharged from OT if patient refuses treatment 3 consecutive times  without medical reason, if treatment goals not met, if there is a change in medical status, if patient makes no progress towards goals or if patient is discharged from hospital.  The above assessment, treatment plan, treatment alternatives and goals were discussed and mutually agreed upon: by patient  Skeet Simmer 03/20/2019, 12:48 PM

## 2019-03-20 NOTE — Evaluation (Signed)
Physical Therapy Assessment and Plan  Patient Details  Name: Travis [name] MRN: [id number] Date of Birth: 1951-06-12  PT Diagnosis: Abnormal posture, Abnormality of gait, Ataxia, Ataxic gait, Coordination disorder and Muscle weakness Rehab Potential: Good ELOS: 7-10 days    Today's Date: 03/20/2019 PT Individual Time: 1300-1400 PT Individual Time Calculation (min): 60 min    Problem List:  Patient Active Problem List   Diagnosis Date Noted  . Cerebellar stroke ([place]) 03/19/2019  . CVA (cerebral vascular accident) ([place]) 03/15/2019  . Nausea & vomiting 03/15/2019  . Squamous cell carcinoma of lungs, bilateral ([place]) 06/11/2018  . Flexor tendon laceration, finger, open wound 07/29/2012  . PVD 12/08/2009  . DIAB W/RENAL MANIFESTS TYPE I [JUV] NOT UNCNTRL 09/08/2009  . HYPERCHOLESTEROLEMIA 09/08/2009  . TOBACCO ABUSE 09/08/2009  . Essential hypertension, benign 09/08/2009  . CORONARY ATHEROSCLEROSIS NATIVE CORONARY ARTERY 09/08/2009  . CKD (chronic kidney disease) stage 3, GFR 30-59 ml/min ([place]) 09/08/2009    Past Medical History:  Past Medical History:  Diagnosis Date  . CAD (coronary artery disease)   . Cancer ([place])    skin cancer face  . COPD (chronic obstructive pulmonary disease) ([place])   . Coronary artery disease    PCA 1994l; PCI august 2003 of an obtust marginal. Catheterization at the time reveal: left main normal; LAD 50-75% between first and second diagonals, 50% after lesion; left circumflex 95% in obtuse marg. RCA 100% proximal.  . Diabetes mellitus    type 2  . Hyperlipemia   . Hypertension   . Kidney disease    chronic; stage 2 followed By Dr Deterding  . Myocardial infarction ([place])   . Osteoarthritis   . Peripheral vascular disease ([place])   . Tobacco abuse    Past Surgical History:  Past Surgical History:  Procedure Laterality Date  . APPENDECTOMY    . CHOLECYSTECTOMY    . CORONARY ANGIOPLASTY     stent times 2, last cath 3 years ago  . FUDUCIAL PLACEMENT  N/A 06/09/2018   Procedure: possible, PLACEMENT OF FUDUCIAL;  Surgeon: Grace Isaac, MD;  Location: [facility];  Service: Thoracic;  Laterality: N/A;  . NERVE EXPLORATION  07/29/2012   Procedure: NERVE EXPLORATION;  Surgeon: Schuyler Amor, MD;  Location: [facility];  Service: Orthopedics;  Laterality: Left;  Exploration /repair nerve and tendon left thumb  . PERIPHERAL VASCULAR CATHETERIZATION N/A 04/26/2015   Procedure: Abdominal Aortogram;  Surgeon: Rosetta Posner, MD;  Location: [facility];  Service: Cardiovascular;  Laterality: N/A;  . VIDEO BRONCHOSCOPY N/A 06/09/2018   Procedure: VIDEO BRONCHOSCOPY;  Surgeon: Grace Isaac, MD;  Location: [facility];  Service: Thoracic;  Laterality: N/A;  . VIDEO BRONCHOSCOPY WITH ENDOBRONCHIAL NAVIGATION N/A 06/09/2018   Procedure: VIDEO BRONCHOSCOPY WITH ENDOBRONCHIAL NAVIGATION;  Surgeon: Grace Isaac, MD;  Location: [facility];  Service: Thoracic;  Laterality: N/A;    Assessment & Plan Clinical Impression: Patient is a 68 year old male with history of SCLC, CAD s/p PCA, T2DM, COPD, PE--on Eliquis,CKD III; who was admitted on 03/15/19 with nausea/vomiting that started night prior to admission, dizziness, speech changes and hypoxia. CT head revealed acute to subacute right cerebellar infarct without hemorrhage. MRI/MRA brain done revealing 3cm acute infarct in right superolateral cerebellum and moderate white matter disease in pons and cerebral white matter. No large vessel occlusion seen. 2 D echo showed EF 60-65% with dilated IVC and no interatrial shunt. Patient reported that symptoms occurred after severe coughing episode  and Dr. Erlinda Hong recommended CTA neck to rule out dissection of VA if SCr improves. Lasix held with improvement in SCr today.   BLE dopplers revealed acute DVT in left posterior tibial vein. Patient on ASA/Eliquis prior to admission and Eliquis increased to 10 mg bid for 14 days followed by home dose. CT  abdomen/pelvis done to rule out metastatic disease and revealed RLL and LLE nodules to be reduced with question of surrounding radiation pneumonitis, complete occlusion of RUL bronchus with potential collapse of most of the lobe with small amount of airspace filling process and new 4 mm LUL nodule and ground glass density as well as lymph node enlargement question reactive. He continues to [employer] withhypoxiabut respiratory status improving.  Patient transferred to CIR on 03/19/2019 .   Patient currently requires mod with mobility secondary to muscle weakness and muscle joint tightness, decreased cardiorespiratoy endurance, impaired timing and sequencing, ataxia and decreased coordination and decreased sitting balance, decreased standing balance, decreased postural control and decreased balance strategies.  Prior to hospitalization, patient was independent  with mobility and lived with Spouse in a House home.  Home access is 1 step into the house Stairs to enter.  Patient will benefit from skilled PT intervention to maximize safe functional mobility, minimize fall risk and decrease caregiver burden for planned discharge home with 24 hour supervision.  Anticipate patient will benefit from follow up [place] at discharge.  PT - End of Session Activity Tolerance: Tolerates < 10 min activity, no significant change in vital signs Endurance Deficit: Yes PT Assessment Rehab Potential (ACUTE/IP ONLY): Good PT Barriers to Discharge: Decreased caregiver support;Medical stability PT Patient demonstrates impairments in the following area(s): Balance;Behavior;Endurance;Motor;Perception;Safety;Sensory;Skin Integrity PT Transfers Functional Problem(s): Bed Mobility;Bed to Chair;Car;Furniture;Floor PT Locomotion Functional Problem(s): Ambulation;Wheelchair Mobility;Stairs PT Plan PT Intensity: Minimum of 1-2 x/day ,45 to 90 minutes PT Frequency: 5 out of 7 days PT Duration Estimated Length of Stay: 7-10 days  PT  Treatment/Interventions: Cognitive remediation/compensation;Ambulation/gait training;Discharge planning;DME/adaptive equipment instruction;Functional mobility training;Pain management;Psychosocial support;Splinting/orthotics;Therapeutic Activities;UE/LE Strength taining/ROM;Visual/perceptual remediation/compensation;Wheelchair propulsion/positioning;UE/LE Coordination activities;Therapeutic Exercise;Stair training;Skin care/wound management;Patient/family education;Neuromuscular re-education;Functional electrical stimulation;Disease management/prevention;Community reintegration;Balance/vestibular training PT Transfers Anticipated Outcome(s): Supervision assist with LRAD  PT Locomotion Anticipated Outcome(s): Ambulatory with LRAD and supervision assist  PT Recommendation Recommendations for Other Services: Therapeutic Recreation consult Follow Up Recommendations: Home health PT Patient destination: Home Equipment Recommended: Rolling walker with 5" wheels;To be determined  Skilled Therapeutic Intervention Pt received supine in bed and agreeable to PT. Supine>sit transfer with supervision assist and min cues for safety and use of bed rails. PT instructed patient in PT Evaluation and initiated treatment intervention; see below for results. PT educated patient in [facility], rehab potential, rehab goals, and discharge recommendations. Gait training without AD as listed below; additional gait training with AD x 130f and min assist from PT for safety and improved weight shift. Car transfer without AD and mod assist from PT for safety with cues for gait pattern and technique. Patient returned to room and [facility] and all needs met.       PT Evaluation Precautions/Restrictions Precautions Precautions: Fall Precaution Comments: Ataxic, LEs>UEs General   Vital Signs Pain Pain Assessment Pain Score: 0-No pain Home Living/Prior Functioning Home Living Available Help at  Discharge: Available 24 hours/day Type of Home: House Home Access: Stairs to enter [employer] Steps: 1 step into the house  Home Layout: One level Bathroom Shower/Tub: WMultimedia programmer Standard Bathroom Accessibility: Yes  Lives  With: Spouse Prior Function Level of Independence: Independent with basic ADLs;Independent with homemaking with ambulation;Independent with transfers  Able to Take Stairs?: Yes Driving: Yes Vocation: Retired Leisure: Hobbies-yes (Comment) Comments: rides motor cycle  Vision/Perception  Perception Perception: Within Functional Limits Praxis Praxis: Intact  Cognition Overall Cognitive Status: Within Functional Limits for tasks assessed Arousal/Alertness: Awake/alert Orientation Level: Oriented X4 Attention: Sustained Sustained Attention: Appears intact Awareness: Appears intact Problem Solving: Appears intact Safety/Judgment: Appears intact Sensation Sensation Light Touch: Appears Intact Proprioception: Impaired by gross assessment Coordination Gross Motor Movements are Fluid and Coordinated: No Fine Motor Movements are Fluid and Coordinated: No Coordination and Movement Description: Ataxic, LEs>UEs Finger Nose Finger Test: Dysmetria bilaterally, Rt>Lt Heel Shin Test: mild dysmetria  9 Hole Peg Test: 64 seconds Rt, 33 seconds Lt Motor  Motor Motor: Ataxia Motor - Skilled Clinical Observations: mild ataxia in BLE and trunk   Mobility Bed Mobility Bed Mobility: Rolling Right;Rolling Left;Supine to Sit;Sit to Supine Rolling Right: Supervision/verbal cueing Rolling Left: Supervision/Verbal cueing Supine to Sit: Supervision/Verbal cueing Sit to Supine: Supervision/Verbal cueing Sit to Sidelying Right: Supervision/Verbal cueing Transfers Transfers: Sit to Stand;Stand to Sit;Stand Pivot Transfers Sit to Stand: Minimal Assistance - Patient > 75% Stand to Sit: Minimal Assistance - Patient > 75% Stand Pivot Transfers:  Moderate Assistance - Patient 50 - 74% Transfer (Assistive device): None Locomotion  Gait Ambulation: Yes Gait Assistance: Moderate Assistance - Patient 50-74% Gait Distance (Feet): 80 Feet Assistive device: None Gait Gait: Yes Gait Pattern: Ataxic;Shuffle;Narrow base of support Gait velocity: decrease  Stairs / Additional Locomotion Stairs: Yes Stairs Assistance: Moderate Assistance - Patient 50 - 74% Stair Management Technique: One rail Right Number of Stairs: 4 Wheelchair Mobility Wheelchair Mobility: Yes Wheelchair Assistance: Chartered loss adjuster: Both upper extremities Wheelchair Parts Management: Needs assistance Distance: 124f  Trunk/Postural Assessment  Cervical Assessment Cervical Assessment: Exceptions to WFL(forward head) Thoracic Assessment Thoracic Assessment: Exceptions to WFL(rounded shoulders) Lumbar Assessment Lumbar Assessment: Exceptions to WFL(posterior pelvic tilt)  Balance Balance Balance Assessed: Yes Dynamic Sitting Balance Dynamic Sitting - Level of Assistance: 5: Stand by assistance Static Standing Balance Static Standing - Level of Assistance: 4: Min assist(no UE support ) Dynamic Standing Balance Dynamic Standing - Level of Assistance: 3: Mod assist(no UE support ) Extremity Assessment  RUE Assessment RUE Assessment: Within Functional Limits Active Range of Motion (AROM) Comments: WNL General Strength Comments: 4-/5 deltoids, 5/5 distally  LUE Assessment LUE Assessment: Within Functional Limits Active Range of Motion (AROM) Comments: WNL General Strength Comments: 4+/5 deltoids, 5/5 distally  RLE Assessment RLE Assessment: Exceptions to [facility] Strength Comments: grossly 4+/5 except hip flexion 4/5  LLE Assessment LLE Assessment: Exceptions to [facility] Strength Comments: grossly 4+/5 except hip flexion 4/5     Refer to Care Plan for Long Term Goals  Recommendations for other services:  Therapeutic Recreation  Kitchen group, Stress management and Outing/community reintegration  Discharge Criteria: Patient will be discharged from PT if patient refuses treatment 3 consecutive times without medical reason, if treatment goals not met, if there is a change in medical status, if patient makes no progress towards goals or if patient is discharged from hospital.  The above assessment, treatment plan, treatment alternatives and goals were discussed and mutually agreed upon: by patient  ALorie Phenix5/07/2019, 1:41 PM

## 2019-03-21 LAB — GLUCOSE, CAPILLARY
Glucose-Capillary: 140 mg/dL — ABNORMAL HIGH (ref 70–99)
Glucose-Capillary: 171 mg/dL — ABNORMAL HIGH (ref 70–99)
Glucose-Capillary: 115 mg/dL — ABNORMAL HIGH (ref 70–99)
Glucose-Capillary: 198 mg/dL — ABNORMAL HIGH (ref 70–99)

## 2019-03-21 NOTE — Progress Notes (Signed)
[place] PHYSICAL MEDICINE & REHABILITATION PROGRESS NOTE   Subjective/Complaints: Still with occasional cough and wheeze. Able to sleep. No problems with therapy  ROS: Patient denies fever, rash, sore throat, blurred vision, nausea, vomiting, diarrhea,   shortness of breath or chest pain, joint or back pain, headache, or mood change.   Objective:   No results found. Recent Labs    03/19/19 0359  WBC 8.6  HGB 12.3*  HCT 40.1  PLT 218   Recent Labs    03/19/19 0819  NA 141  K 4.4  CL 106  CO2 26  GLUCOSE 156*  BUN 39*  CREATININE 1.64*  CALCIUM 8.7*    Intake/Output Summary (Last 24 hours) at 03/21/2019 0854 Last data filed at 03/21/2019 0843 Gross per 24 hour  Intake 360 ml  Output 1250 ml  Net -890 ml     Physical Exam: Vital Signs Blood pressure (!) 152/73, pulse 81, temperature 98.4 F (36.9 C), temperature source Oral, resp. rate 19, weight 92.7 kg, SpO2 93 %. Constitutional: No distress . Vital signs reviewed. HEENT: EOMI, oral membranes moist Neck: supple Cardiovascular: RRR without murmur. No JVD    Respiratory: occasional exp wheeze, intermittent productive cough  GI: BS +, non-tender, non-distended   Neurological: He isalertand oriented to person, place, and time. He displaysnormal reflexes. Nocranial nerve deficit. He exhibitsnormal muscle tone.Coordinationabnormal.  Mild right facial weakness with anarthria/mild dysarthria without change. Reasonable insight and awareness. Normal language. Ataxia RUE>RLE with FTN and HTS. Strength 4+/5 RUE and RLE and 5/5 LUE and LLE. No sensory findings.  Skin: Skin iswarmand dry. He isnot diaphoretic.  Psychiatric: pleasant.     Assessment/Plan: 1. Functional deficits secondary to right cerebellar infarct which require 3+ hours per day of interdisciplinary therapy in a comprehensive inpatient rehab setting.  Physiatrist is providing close team supervision and 24 hour management of active medical  problems listed below.  Physiatrist and rehab team continue to assess barriers to discharge/monitor patient progress toward functional and medical goals  Care Tool:  Bathing    Body parts bathed by patient: Right arm, Left arm, Chest, Abdomen, Front perineal area, Buttocks, Right upper leg, Left upper leg, Right lower leg, Left lower leg, Face         Bathing assist Assist Level: Minimal Assistance - Patient > 75%     Upper Body Dressing/Undressing Upper body dressing   What is the patient wearing?: Pull over shirt    Upper body assist Assist Level: Supervision/Verbal cueing    Lower Body Dressing/Undressing Lower body dressing            Lower body assist Assist for lower body dressing: Minimal Assistance - Patient > 75%     Toileting Toileting Toileting Activity did not occur (Clothing management and hygiene only): N/A (no void or bm)  Toileting assist Assist for toileting: Independent with assistive device Assistive Device Comment: urinal   Transfers Chair/bed transfer  Transfers assist     Chair/bed transfer assist level: Moderate Assistance - Patient 50 - 74%     Locomotion Ambulation   Ambulation assist      Assist level: Moderate Assistance - Patient 50 - 74% Assistive device: No Device Max distance: 80   Walk 10 feet activity   Assist     Assist level: Moderate Assistance - Patient - 50 - 74% Assistive device: No Device   Walk 50 feet activity   Assist    Assist level: Moderate Assistance - Patient - 50 - 74%  Assistive device: No Device    Walk 150 feet activity   Assist Walk 150 feet activity did not occur: Safety/medical concerns         Walk 10 feet on uneven surface  activity   Assist Walk 10 feet on uneven surfaces activity did not occur: Safety/medical concerns         Wheelchair     Assist   Type of Wheelchair: Manual    Wheelchair assist level: Supervision/Verbal cueing Max wheelchair distance:  159ft    Wheelchair 50 feet with 2 turns activity    Assist        Assist Level: Supervision/Verbal cueing   Wheelchair 150 feet activity     Assist     Assist Level: Supervision/Verbal cueing    Medical Problem List and Plan: 1.Functional and mobility deficitssecondary to right cerebellar infarct -continue CIR therapies today including PT, OT, and SLP  2. Antithrombotics: -Acute leftDVT/PE dx-10/2018/anticoagulation:Pharmaceutical:Other (comment)--Eliquis treatment dose X 2 weeks followed by maintaince dose. -antiplatelet therapy: ASA 3. Pain Management:N/A 4. Mood:LCSW to follow for evaluation and support. -antipsychotic agents: N/A 5. Neuropsych: This patientiscapable of making decisions on hisown behalf. 6. Skin/Wound Care:Routine pressure relief measures. 7. Fluids/Electrolytes/Nutrition:Monitor I/O. Check lytes in am.  8. HTN: Monitor BP tid. Continue Benazepril, Metoprolol and Amlodipine. Lasix on hold.   -fair control 5/10 9. CKD Stage III: Baseline SCr around 2.2. BUN/SCr- 53/2.05 at admission--now down to 39/1.64. Avoid nephrotoxic medications.   -Avoid hypoperfusionpost-stroke.  10. Nausea/Vomiting:Denies symptoms at present 11. CAD/PVD: On low dose ASA, Lipitor, Eliquis and BB 12. T2DM: Monitor BS ac/hs --Was on Tujeo 300- 27 units daily with humalog 11 units bid lunch/supper. Continue SSI for now  -fair control  -intake has improved  -began lantus 5 units daily in place of Tujeo on 5/9---observe today 13. Squamous cell lung cancer: S/p XRT 14. Recent PNA/Hypoxia:Was completing course of Levaquin at admission. afebrile -continued use of flutter valve.  -cxr from 5/4 with increased right hilar mass -doxycycline bid day# 4  -add mucinex  -encourage FV/IS/OOB  -consider f/u cxr if symptoms progress    LOS: 2 days A FACE TO [place] 03/21/2019, 8:54 AM

## 2019-03-22 ENCOUNTER — Inpatient Hospital Stay (HOSPITAL_COMMUNITY): Payer: Medicare Other | Admitting: Physical Therapy

## 2019-03-22 ENCOUNTER — Inpatient Hospital Stay (HOSPITAL_COMMUNITY): Payer: Medicare Other | Admitting: Speech Pathology

## 2019-03-22 ENCOUNTER — Inpatient Hospital Stay (HOSPITAL_COMMUNITY): Payer: Medicare Other | Admitting: Occupational Therapy

## 2019-03-22 DIAGNOSIS — R042 Hemoptysis: Secondary | ICD-10-CM

## 2019-03-22 DIAGNOSIS — C3490 Malignant neoplasm of unspecified part of unspecified bronchus or lung: Secondary | ICD-10-CM

## 2019-03-22 DIAGNOSIS — I69393 Ataxia following cerebral infarction: Principal | ICD-10-CM

## 2019-03-22 LAB — GLUCOSE, CAPILLARY
Glucose-Capillary: 134 mg/dL — ABNORMAL HIGH (ref 70–99)
Glucose-Capillary: 172 mg/dL — ABNORMAL HIGH (ref 70–99)
Glucose-Capillary: 133 mg/dL — ABNORMAL HIGH (ref 70–99)
Glucose-Capillary: 155 mg/dL — ABNORMAL HIGH (ref 70–99)

## 2019-03-22 LAB — CBC WITH DIFFERENTIAL/PLATELET
Abs Immature Granulocytes: 0.03 10*3/uL (ref 0.00–0.07)
Basophils Absolute: 0.1 10*3/uL (ref 0.0–0.1)
Basophils Relative: 1 %
Eosinophils Absolute: 0.4 10*3/uL (ref 0.0–0.5)
Eosinophils Relative: 5 %
HCT: 45 % (ref 39.0–52.0)
Hemoglobin: 13.6 g/dL (ref 13.0–17.0)
Immature Granulocytes: 0 %
Lymphocytes Relative: 11 %
Lymphs Abs: 0.9 10*3/uL (ref 0.7–4.0)
MCH: 26.1 pg (ref 26.0–34.0)
MCHC: 30.2 g/dL (ref 30.0–36.0)
MCV: 86.4 fL (ref 80.0–100.0)
Monocytes Absolute: 0.7 10*3/uL (ref 0.1–1.0)
Monocytes Relative: 9 %
Neutro Abs: 6 10*3/uL (ref 1.7–7.7)
Neutrophils Relative %: 74 %
Platelets: 275 10*3/uL (ref 150–400)
RBC: 5.21 MIL/uL (ref 4.22–5.81)
RDW: 14 % (ref 11.5–15.5)
WBC: 8 10*3/uL (ref 4.0–10.5)
nRBC: 0 % (ref 0.0–0.2)

## 2019-03-22 LAB — COMPREHENSIVE METABOLIC PANEL
ALT: 13 U/L (ref 0–44)
AST: 15 U/L (ref 15–41)
Albumin: 2.6 g/dL — ABNORMAL LOW (ref 3.5–5.0)
Alkaline Phosphatase: 61 U/L (ref 38–126)
Anion gap: 7 (ref 5–15)
BUN: 27 mg/dL — ABNORMAL HIGH (ref 8–23)
CO2: 25 mmol/L (ref 22–32)
Calcium: 9.1 mg/dL (ref 8.9–10.3)
Chloride: 109 mmol/L (ref 98–111)
Creatinine, Ser: 1.3 mg/dL — ABNORMAL HIGH (ref 0.61–1.24)
GFR calc Af Amer: >60 mL/min (ref >60)
GFR calc non Af Amer: 56 mL/min — ABNORMAL LOW (ref >60)
Glucose, Bld: 159 mg/dL — ABNORMAL HIGH (ref 70–99)
Potassium: 5 mmol/L (ref 3.5–5.1)
Sodium: 141 mmol/L (ref 135–145)
Total Bilirubin: 0.5 mg/dL (ref 0.3–1.2)
Total Protein: 6.5 g/dL (ref 6.5–8.1)

## 2019-03-22 MED ORDER — HYDROCOD POLST-CPM POLST ER 10-8 MG/5ML PO SUER: 5.0000 mL | Freq: Two times a day (BID) | ORAL | Status: DC | PRN

## 2019-03-22 MED ORDER — DEXTROMETHORPHAN POLISTIREX ER 30 MG/5ML PO SUER
15.0000 mg | Freq: Two times a day (BID) | ORAL | Status: DC
  Administered 2019-03-22 – 2019-03-27 (×11): 15 mg via ORAL
  Filled 2019-03-22 (×11): qty 5

## 2019-03-22 NOTE — Progress Notes (Addendum)
[place] PHYSICAL MEDICINE & REHABILITATION PROGRESS NOTE   Subjective/Complaints: Occ cough produces sputum, blood tinged, hx of lung ca but pt not sure last treatment with rad tx (a couple months vs a year)  ROS: Patient denies , nausea, vomiting, diarrhea,   shortness of breath or chest pain, joint or back pain,  Objective:   No results found. Recent Labs    03/22/19 0702  WBC 8.0  HGB 13.6  HCT 45.0  PLT 275   Recent Labs    03/22/19 0702  NA 141  K 5.0  CL 109  CO2 25  GLUCOSE 159*  BUN 27*  CREATININE 1.30*  CALCIUM 9.1    Intake/Output Summary (Last 24 hours) at 03/22/2019 0830 Last data filed at 03/22/2019 0801 Gross per 24 hour  Intake 800 ml  Output 1550 ml  Net -750 ml     Physical Exam: Vital Signs Blood pressure (!) 139/51, pulse 71, temperature 98.2 F (36.8 C), temperature source Oral, resp. rate 19, weight 92.4 kg, SpO2 94 %. Constitutional: No distress . Vital signs reviewed. HEENT: EOMI, oral membranes moist Neck: supple Cardiovascular: RRR without murmur. No JVD    Respiratory: occasional exp wheeze, intermittent productive cough  GI: BS +, non-tender, non-distended   Neurological: He isalertand oriented to person, place, and time. He displaysnormal reflexes. Nocranial nerve deficit. He exhibitsnormal muscle tone.Coordinationabnormal.  Mild right facial weakness with anarthria/mild dysarthria without change. Reasonable insight and awareness. Normal language. Ataxia RUE>RLE with FTN and HTS. Strength 4+/5 RUE and RLE and 5/5 LUE and LLE. No sensory findings.  Skin: Skin iswarmand dry. He isnot diaphoretic.  Psychiatric: pleasant.     Assessment/Plan: 1. Functional deficits secondary to right cerebellar infarct which require 3+ hours per day of interdisciplinary therapy in a comprehensive inpatient rehab setting.  Physiatrist is providing close team supervision and 24 hour management of active medical problems listed  below.  Physiatrist and rehab team continue to assess barriers to discharge/monitor patient progress toward functional and medical goals  Care Tool:  Bathing    Body parts bathed by patient: Right arm, Left arm, Chest, Abdomen, Front perineal area, Buttocks, Right upper leg, Left upper leg, Right lower leg, Left lower leg, Face         Bathing assist Assist Level: Minimal Assistance - Patient > 75%     Upper Body Dressing/Undressing Upper body dressing   What is the patient wearing?: Pull over shirt    Upper body assist Assist Level: Contact Guard/Touching assist    Lower Body Dressing/Undressing Lower body dressing            Lower body assist Assist for lower body dressing: Minimal Assistance - Patient > 75%     Toileting Toileting Toileting Activity did not occur (Clothing management and hygiene only): N/A (no void or bm)  Toileting assist Assist for toileting: Independent with assistive device Assistive Device Comment: urinal   Transfers Chair/bed transfer  Transfers assist     Chair/bed transfer assist level: Moderate Assistance - Patient 50 - 74%     Locomotion Ambulation   Ambulation assist      Assist level: Moderate Assistance - Patient 50 - 74% Assistive device: No Device Max distance: 80   Walk 10 feet activity   Assist     Assist level: Moderate Assistance - Patient - 50 - 74% Assistive device: No Device   Walk 50 feet activity   Assist    Assist level: Moderate Assistance - Patient -  50 - 74% Assistive device: No Device    Walk 150 feet activity   Assist Walk 150 feet activity did not occur: Safety/medical concerns         Walk 10 feet on uneven surface  activity   Assist Walk 10 feet on uneven surfaces activity did not occur: Safety/medical concerns         Wheelchair     Assist   Type of Wheelchair: Manual    Wheelchair assist level: Supervision/Verbal cueing Max wheelchair distance: 179ft     Wheelchair 50 feet with 2 turns activity    Assist        Assist Level: Supervision/Verbal cueing   Wheelchair 150 feet activity     Assist     Assist Level: Supervision/Verbal cueing    Medical Problem List and Plan: 1.Functional and mobility deficitssecondary to right cerebellar infarct -continue CIR therapies today including PT, OT, and SLP  2. Antithrombotics: -Acute leftDVT/PE dx-10/2018/anticoagulation:Pharmaceutical:Other (comment)--Eliquis treatment dose X 2 weeks followed by maintaince dose. -antiplatelet therapy: ASA 3. Pain Management:N/A 4. Mood:LCSW to follow for evaluation and support. -antipsychotic agents: N/A 5. Neuropsych: This patientiscapable of making decisions on hisown behalf. 6. Skin/Wound Care:Routine pressure relief measures. 7. Fluids/Electrolytes/Nutrition:Monitor I/O. Check lytes in am.  8. HTN: Monitor BP tid. Continue Benazepril, Metoprolol and Amlodipine. Lasix on hold.   -fair control 5/10 9. CKD Stage III: Baseline SCr around 2.2. BUN/SCr- 53/2.05 at admission--now down to 39/1.64. Avoid nephrotoxic medications.   -Avoid hypoperfusionpost-stroke.  10. Nausea/Vomiting:Denies symptoms at present 11. CAD/PVD: On low dose ASA, Lipitor, Eliquis and BB 12. T2DM: Monitor BS ac/hs --Was on Tujeo 300- 27 units daily with humalog 11 units bid lunch/supper. Continue SSI for now  - CBG (last 3)  Recent Labs    03/21/19 1626 03/21/19 2101 03/22/19 0620  GLUCAP 115* 198* 134*    -controlled   -began lantus 5 units daily in place of Tujeo on 5/9---observe today 13. Squamous cell lung cancer: S/p XRT 14. Recent PNA/Hypoxia:Was completing course of Levaquin at admission. afebrile -continued use of flutter valve.  -cxr from 5/4 with increased right hilar mass -doxycycline bid day# 5  -add mucinex  -encourage FV/IS/OOB  -Appreciate Dr. [name], pulmonary  input  LOS: 3 days A FACE TO [place] 03/22/2019, 8:30 AM

## 2019-03-22 NOTE — IPOC Note (Signed)
Overall Plan of Care [facility]) Patient Details Name: Travis [name] MRN: [id number] DOB: Jan 13, 1951  Admitting Diagnosis: <principal problem not specified>  Hospital Problems: Active Problems:   Cerebellar stroke [facility])     Functional Problem List: Nursing Edema, Endurance, Medication Management, Nutrition, Pain, Safety, Skin Integrity, Bladder, Bowel  PT Balance, Behavior, Endurance, Motor, Perception, Safety, Sensory, Skin Integrity  OT Balance, Endurance, Motor, Safety  SLP    TR         Basic ADL's: OT Grooming, Bathing, Dressing, Toileting     Advanced  ADL's: OT Simple Meal Preparation     Transfers: PT Bed Mobility, Bed to Chair, Car, Furniture, Floor  OT Toilet, Metallurgist: PT Ambulation, Emergency planning/management officer, Stairs     Additional Impairments: OT None  SLP Communication expression    TR      Anticipated Outcomes Item Anticipated Outcome  Self Feeding No goal  Swallowing      Basic self-care  Mod I   Toileting  Mod I    Bathroom Transfers Mod I   Bowel/Bladder  patient will be continent of bowel and bladder with min assist  Transfers  Supervision assist with LRAD   Locomotion  Ambulatory with LRAD and supervision assist   Communication  Supervision with simple conversation  Cognition     Pain  pain less than or equal to 4/10 with min assist  Safety/Judgment  patient will be free from falls/injury and displaying sound safety judgement with min assist   Therapy Plan: PT Intensity: Minimum of 1-2 x/day ,45 to 90 minutes PT Frequency: 5 out of 7 days PT Duration Estimated Length of Stay: 7-10 days  OT Intensity: Minimum of 1-2 x/day, 45 to 90 minutes OT Frequency: 5 out of 7 days OT Duration/Estimated Length of Stay: 7-10 days SLP Intensity: Minumum of 1-2 x/day, 30 to 90 minutes SLP Frequency: 3 to 5 out of 7 days SLP Duration/Estimated Length of Stay: 8 to 10 days   Due to the current state of emergency, patients may not be  receiving their 3-hours of Medicare-mandated therapy.   Team Interventions: Nursing Interventions Patient/Family Education, Disease Management/Prevention, Pain Management, Medication Management, Cognitive Remediation/Compensation, Discharge Planning, Bladder Management, Bowel Management  PT interventions Cognitive remediation/compensation, Ambulation/gait training, Discharge planning, DME/adaptive equipment instruction, Functional mobility training, Pain management, Psychosocial support, Splinting/orthotics, Therapeutic Activities, UE/LE Strength taining/ROM, Visual/perceptual remediation/compensation, Wheelchair propulsion/positioning, UE/LE Coordination activities, Therapeutic Exercise, Stair training, Skin care/wound management, Patient/family education, Neuromuscular re-education, Functional electrical stimulation, Disease management/prevention, Academic librarian, Training and development officer  OT Interventions Training and development officer, Disease mangement/prevention, Neuromuscular re-education, Self Care/advanced ADL retraining, Therapeutic Exercise, UE/LE Strength taining/ROM, Pain management, DME/adaptive equipment instruction, Patient/family education, UE/LE Coordination activities, Therapeutic Activities, Psychosocial support, Functional mobility training, Discharge planning  SLP Interventions Speech/Language facilitation, Patient/family education  TR Interventions    SW/CM Interventions     Barriers to Discharge MD  Medical stability and Lack of/limited family support  Nursing      PT Decreased caregiver support, Medical stability    OT Medical stability, New oxygen    SLP      SW       Team Discharge Planning: Destination: PT-Home ,OT- Home , SLP-Home Projected Follow-up: PT-Home health PT, OT-  Home health OT, SLP-Home Health SLP Projected Equipment Needs: PT-Rolling walker with 5" wheels, To be determined, OT- To be determined, SLP-None recommended by SLP Equipment  Details: PT- , OT-  Patient/family involved in discharge planning: PT- Patient,  OT-Patient, SLP-Patient  MD  ELOS: 7-10d Medical Rehab Prognosis:  Good Assessment:  68 year old male with history of SCLC, CAD s/p PCA, T2DM, COPD, PE--on Eliquis,CKD III; who was admitted on 03/15/19 with nausea/vomiting that started night prior to admission, dizziness, speech changes and hypoxia. CT head revealed acute to subacute right cerebellar infarct without hemorrhage. MRI/MRA brain done revealing 3cm acute infarct in right superolateral cerebellum and moderate white matter disease in pons and cerebral white matter. No large vessel occlusion seen. 2 D echo showed EF 60-65% with dilated IVC and no interatrial shunt. Patient reported that symptoms occurred after severe coughing episode and Dr. Erlinda Hong recommended CTA neck to rule out dissection of VA if SCr improves. Lasix held with improvement in SCr today.   BLE dopplers revealed acute DVT in left posterior tibial vein. Patient on ASA/Eliquis prior to admission and Eliquis increased to 10 mg bid for 14 days followed by home dose. CT abdomen/pelvis done to rule out metastatic disease and revealed RLL and LLE nodules to be reduced with question of surrounding radiation pneumonitis, complete occlusion of RUL bronchus with potential collapse of most of the lobe with small amount of airspace filling process and new 4 mm LUL nodule and ground glass density as well as lymph node enlargement question reactive. He continues to [employer] withhypoxiabut respiratory status improving.Patient with resultant right hemiataxia, dysarthriaandanarthria affecting functional status   Now requiring 24/7 Rehab RN,MD, as well as CIR level PT, OT and SLP.  Treatment team will focus on ADLs and mobility with goals set at Mod I See Team Conference Notes for weekly updates to the plan of care

## 2019-03-22 NOTE — Progress Notes (Signed)
Occupational Therapy Session Note  Patient Details  Name: Travis [name] MRN: [id number] Date of Birth: 1951-08-13  Today's Date: 03/22/2019 OT Individual Time: 5009-3818 OT Individual Time Calculation (min): 72 min   Short Term Goals: Week 1:  OT Short Term Goal 1 (Week 1): STGs=LTGs due to ELOS  Skilled Therapeutic Interventions/Progress Updates:    Pt greeted in bed with no c/o pain. Agreeable to shower. During session, he completed toileting (using standard toilet, with continent B+B void), bathing (sit<stand from TTB), dressing (sit<stand from toilet), oral care/grooming tasks (standing at sink), and simulated walk-in shower transfers (using ledge, shower chair and RW for transfer). All functional transfers were completed at ambulatory level with close supervision-steady assist using RW. Min vcs required for hand placement during stand<sit transitions. Close supervision-steady assist required for dynamic standing balance during stated tasks. Pt used R UE at dominant level, able to uncap soap bottle and tie shoe laces. He still reports he doesn't have "full control" of affected UE, and that writing is still very difficult. During simulated walk-in shower transfers, discussed bathroom modifications to implement to maximize safety. He verbalized planning on having grab bars installed. Advised to place them on Lt side of shower (when backing up) for safely turning to sit on chair placed on Rt side. Discussed placing shower chair directly in front of door until grab bars are installed and utilizing lateral leans for hygiene. Also advised purchase of nonslip shower treads. Pt appeared receptive to education. Bed transfers in<out of flat bed without bedrails completed unassisted (setup to simulate home). At end of session pt returned to bed and was left with all needs within reach and bed alarm set.   02 sats throughout session 93-100% on 1L for 02 weaning. Per RN, okay to keep pt at 1L, as sats were still  stable when he was in bed before departure.   Therapy Documentation Precautions:  Precautions Precautions: Fall Precaution Comments: Ataxic, LEs>UEs Restrictions Weight Bearing Restrictions: No Vital Signs: Therapy Vitals Temp: 98.4 F (36.9 C) Temp Source: Oral Pulse Rate: 76 Resp: 20 BP: (!) 157/61 Patient Position (if appropriate): Lying Oxygen Therapy SpO2: 90 % O2 Device: Nasal Cannula O2 Flow Rate (L/min): 1 L/min(Increased to 1L per SAT's.) ADL: ADL Eating: Not assessed Grooming: Contact guard Where Assessed-Grooming: Standing at sink Upper Body Bathing: Supervision/safety Where Assessed-Upper Body Bathing: Edge of bed Lower Body Bathing: Minimal assistance Where Assessed-Lower Body Bathing: Edge of bed Upper Body Dressing: Supervision/safety Where Assessed-Upper Body Dressing: Edge of bed Lower Body Dressing: Minimal assistance Where Assessed-Lower Body Dressing: Edge of bed Toileting: Minimal assistance Where Assessed-Toileting: Bedside Commode Toilet Transfer: Minimal assistance Toilet Transfer Method: Insurance claims handler Equipment: Grab bars(RW) Tub/Shower Transfer: Not assessed      Therapy/Group: Individual Therapy  Jesson Foskey A Faelynn Wynder 03/22/2019, 4:20 PM

## 2019-03-22 NOTE — Progress Notes (Signed)
Social Work  Social Work Assessment and Plan  Patient Details  Name: Travis [name] MRN: [id number] Date of Birth: 01-16-1951  Today's Date: 03/22/2019  Problem List:  Patient Active Problem List   Diagnosis Date Noted  . Cerebellar stroke ([place]) 03/19/2019  . CVA (cerebral vascular accident) ([place]) 03/15/2019  . Nausea & vomiting 03/15/2019  . Squamous cell carcinoma of lungs, bilateral ([place]) 06/11/2018  . Flexor tendon laceration, finger, open wound 07/29/2012  . PVD 12/08/2009  . DIAB W/RENAL MANIFESTS TYPE I [JUV] NOT UNCNTRL 09/08/2009  . HYPERCHOLESTEROLEMIA 09/08/2009  . TOBACCO ABUSE 09/08/2009  . Essential hypertension, benign 09/08/2009  . CORONARY ATHEROSCLEROSIS NATIVE CORONARY ARTERY 09/08/2009  . CKD (chronic kidney disease) stage 3, GFR 30-59 ml/min ([place]) 09/08/2009   Past Medical History:  Past Medical History:  Diagnosis Date  . CAD (coronary artery disease)   . Cancer ([place])    skin cancer face  . COPD (chronic obstructive pulmonary disease) ([place])   . Coronary artery disease    PCA 1994l; PCI august 2003 of an obtust marginal. Catheterization at the time reveal: left main normal; LAD 50-75% between first and second diagonals, 50% after lesion; left circumflex 95% in obtuse marg. RCA 100% proximal.  . Diabetes mellitus    type 2  . Hyperlipemia   . Hypertension   . Kidney disease    chronic; stage 2 followed By Dr Deterding  . Myocardial infarction ([place])   . Osteoarthritis   . Peripheral vascular disease ([place])   . Tobacco abuse    Past Surgical History:  Past Surgical History:  Procedure Laterality Date  . APPENDECTOMY    . CHOLECYSTECTOMY    . CORONARY ANGIOPLASTY     stent times 2, last cath 3 years ago  . FUDUCIAL PLACEMENT N/A 06/09/2018   Procedure: possible, PLACEMENT OF FUDUCIAL;  Surgeon: Grace Isaac, MD;  Location: [place];  Service: Thoracic;  Laterality: N/A;  . NERVE EXPLORATION  07/29/2012   Procedure: NERVE EXPLORATION;  Surgeon: Schuyler Amor, MD;  Location: [place];  Service: Orthopedics;  Laterality: Left;  Exploration /repair nerve and tendon left thumb  . PERIPHERAL VASCULAR CATHETERIZATION N/A 04/26/2015   Procedure: Abdominal Aortogram;  Surgeon: Rosetta Posner, MD;  Location: [place] CV LAB;  Service: Cardiovascular;  Laterality: N/A;  . VIDEO BRONCHOSCOPY N/A 06/09/2018   Procedure: VIDEO BRONCHOSCOPY;  Surgeon: Grace Isaac, MD;  Location: [place];  Service: Thoracic;  Laterality: N/A;  . VIDEO BRONCHOSCOPY WITH ENDOBRONCHIAL NAVIGATION N/A 06/09/2018   Procedure: VIDEO BRONCHOSCOPY WITH ENDOBRONCHIAL NAVIGATION;  Surgeon: Grace Isaac, MD;  Location: [place];  Service: Thoracic;  Laterality: N/A;   Social History:  reports that he quit smoking about 3 years ago. His smoking use included cigarettes. He has a 25.00 pack-year smoking history. He quit smokeless tobacco use about 4 years ago.  His smokeless tobacco use included chew. He reports that he does not drink alcohol or use drugs.  Family / Support Systems Marital Status: Married Patient Roles: Spouse, Parent, Other (Comment)(retiree) Spouse/Significant Other: Otila Kluver [phone]  [phone] Children: Two children who are out of state Other Supports: Church members and friends Anticipated Caregiver: Wife Ability/Limitations of Caregiver: Farily healthy Caregiver Availability: 24/7 Family Dynamics: Close knit family who are involved their children live out of state but are supportive. He has always been independent and taken care of himself, he does not want to burden his wife. They have friends and church members who  are supportive.  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 Pt wants his wife involved while here   Abuse/Neglect Abuse/Neglect Assessment Can Be Completed: Yes Physical Abuse: Denies Verbal Abuse: Denies Sexual Abuse: Denies Exploitation of patient/patient's resources: Denies Self-Neglect: Denies  Emotional Status Pt's affect, behavior and adjustment status: Pt is motivated to do well and recover from this stroke. He has made some progress already but realizes he needs to make much more. He would like to be able to get off the O2 he did not have at home prior to admission. Recent Psychosocial Issues: other health issues-lung cancer was being treated for. DVT in leg upon admission Psychiatric History: No history deferred depression screen due to coping appropriately with this CVA. He is hopeful he will do well here. May benefit from seeing neuro-psych while here for coping with his lung cancer and now his stroke. Substance Abuse History: Quit tobacco 3 years ago  Patient / Family Perceptions, Expectations & Goals Pt/Family understanding of illness & functional limitations: Pt and wife are able to explain his stroke and his deficits. He talks with the MD daily while rounding and feels he has a good understanding of his treatment plan going forward. He would like to see if his O2 can be weaned while here. Premorbid pt/family roles/activities: Husband, father, grandfather, retiree, church member, retiree, Social research officer, government Anticipated changes in roles/activities/participation: resume Pt/family expectations/goals: Pt states: " I want to be able to do for myself before I leave here."  Wife states: " I will help with what I can, but hope he is doing well and make progress while here."  [employer]: Other (Comment)([facility]) Premorbid Home Care/DME Agencies: None Transportation available at discharge: Wife, pt drove prior to admission Resource referrals recommended: Neuropsychology, Support group (specify)  Discharge  Planning Living Arrangements: Spouse/significant other Support Systems: Spouse/significant other, Children, Friends/neighbors, Church/faith community Type of Residence: Private residence Insurance Resources: [employer], Multimedia programmer (specify)([employer]) Financial Resources: Radio broadcast assistant Screen Referred: No Living Expenses: Own Money Management: Spouse, Patient Does the patient have any problems obtaining your medications?: No Home Management: Wife Patient/Family Preliminary Plans: Return home with wife who is able to assist with pt's care if needed. Both are glad he is here and feel this is the best place for him. Will await therapy evaluations and work on discharge needs. Wife will need education prior to discharge home. Social Work Anticipated Follow Up Needs: HH/OP, Support Group  Clinical Impression Pleasant gentleman who is motivated to do well and recover from his stroke. He has made progress thus far and is encouraged by this. His wife can assist if needed at discharge and will need education prior to DC home. Do feel with pt's medical issues would benefit from seeing neuro-psych while here.  Elease Hashimoto 03/22/2019, 9:42 AM

## 2019-03-22 NOTE — Progress Notes (Signed)
Inpatient Rehabilitation  Patient information reviewed and entered into eRehab system by Sadie Hazelett M. Calista Crain, M.A., CCC/SLP, PPS Coordinator.  Information including medical coding, functional ability and quality indicators will be reviewed and updated through discharge.    

## 2019-03-22 NOTE — Progress Notes (Signed)
Physical Therapy Session Note  Patient Details  Name: Travis [name] MRN: [id number] Date of Birth: 03/21/1951  Today's Date: 03/22/2019 PT Individual Time: 1330-1430 PT Individual Time Calculation (min): 60 min   Short Term Goals: Week 1:  PT Short Term Goal 1 (Week 1): STG=LTG due to ELOS  Skilled Therapeutic Interventions/Progress Updates: Pt presented in bed agreeable to therapy. Pt denies pain throughout session. Pt performed bed mobility with use of bed features supervision. Performed STS CGA with RW and ambulated to day room minA and narrow BOS, cues for safe use of RW and intermittent R ankle instability noted. Session focused on balance and endurance activities with pt on 1L supplemental O2 and no desat throughout session. Pt performed STS without UE for BLE strengthening and endurance 2 x 5, alternating toe taps x 20 with RW for wt shifting and cues for maintaining straight alignment when returning to stance position. Pt also participated in ball catch without AD with min to mod challenges and CGA. PTA allowed frequent breaks to monitor O2 levels, no indicated no signs of distress throughout session.Increased challenge to perform toss with single LE on 4in step with pt maintaining fair balance.Pt also participated in bowling performing small lunge with RLE requiring increased time and multiple steps to return to neutral. Pt also performed bowling tossing "granny style" while performing small squat. Performed same activity while standing on airex requiring intermittent minA for stability. Pt ambulated back to room with minA and moderate verbal cues for increasing BOS, staying within RW,and decreasing power with swing through with RLE. Pt returned to bed at end of session and left with call bell within reach and needs met.      Therapy Documentation Precautions:  Precautions Precautions: Fall Precaution Comments: Ataxic, LEs>UEs Restrictions Weight Bearing Restrictions: No General:   Vital  Signs: Therapy Vitals Temp: 98.4 F (36.9 C) Temp Source: Oral Pulse Rate: 76 Resp: 20 BP: (!) 157/61 Patient Position (if appropriate): Lying Oxygen Therapy SpO2: 90 % O2 Device: Nasal Cannula O2 Flow Rate (L/min): 1 L/min(Increased to 1L per SAT's.)  Therapy/Group: Individual Therapy  Rosita DeChalus  Rosita DeChalus, PTA  03/22/2019, 5:00 PM  

## 2019-03-22 NOTE — Care Management Note (Signed)
[facility] Individual Statement of Services  Patient Name:  Travis [name]  Date:  03/22/2019  Welcome to the [facility].  Our goal is to provide you with an individualized program based on your diagnosis and situation, designed to meet your specific needs.  With this comprehensive rehabilitation program, you will be expected to participate in at least 3 hours of rehabilitation therapies Monday-Friday, with modified therapy programming on the weekends.  Your rehabilitation program will include the following services:  Physical Therapy (PT), Occupational Therapy (OT), Speech Therapy (ST), 24 hour per day rehabilitation nursing, Case Management (Social Worker), Rehabilitation Medicine, Nutrition Services and Pharmacy Services  Weekly team conferences will be held on Wednesday to discuss your progress.  Your Social Worker will talk with you frequently to get your input and to update you on team discussions.  Team conferences with you and your family in attendance may also be held.  Expected length of stay: 7-10 days  Overall anticipated outcome: supervision with ambulation and independent with ADL's  Depending on your progress and recovery, your program may change. Your Social Worker will coordinate services and will keep you informed of any changes. Your Social Worker's name and contact numbers are listed  below.  The following services may also be recommended but are not provided by the [facility] will be made to provide these services after discharge if needed.  Arrangements include referral to agencies that provide these services.  Your insurance has been verified to be:  [place] Your primary doctor is:  [facility] [place]  Pertinent information will be shared with your doctor and your insurance  company.  Social Worker:  Ovidio Kin, [place] or ([phone]  Information discussed with and copy given to patient by: Elease Hashimoto, 03/22/2019, 9:20 AM

## 2019-03-22 NOTE — Consult Note (Signed)
NAME:  Travis [name], MRN:  [id number], DOB:  1950/11/24, LOS: 3 ADMISSION DATE:  03/19/2019, CONSULTATION DATE:  03/22/19 REFERRING MD:  Dr. Ella Bodo, CHIEF COMPLAINT:  Hemoptysis   Brief History   68 y/o M admitted to [facility] inpatient rehab on 5/8 after admission from 5/4-5/8 for right cerebellar infarct without hemorrhage. Hospital course notable for L DVT which was treated with , CT with RLL, LLL nodules, complete occlusion of RUL bronchus.  PCCM called for hemoptysis 5/11 for evaluation.   History of present illness   68 y/o M, former smoker, admitted to [facility] inpatient rehab on 5/8 after admission from 5/4-5/8 for right cerebellar infarct without hemorrhage. Hospital course notable for L DVT treated with Eliquis + ASA in setting of CVA.    He previously diagnosed with SCLC by bronchoscopy in July 2019 by Dr. Servando Snare and was undergoing sterotactic radiotherapy to both lower lobe lesions and RUL per Dr. Tammi Klippel (has completed 10 treatments per patient).  He has received some of his care at [facility] in [facility] past. Follow up CT chest (5/6) during hospitalization showed RLL, LLL nodules reduced in conspicuity, possible radiation pneumonitis, complete occlusion of RUL bronchus, airway thickening, new 37mm LUL nodule.    The patient developed hemoptysis while on Eliquis + ASA at rehab.  PCCM called for hemoptysis 5/11 for evaluation. Hgb has remained stable in range of 12-13 g/dL.   Pt denies chest pain, fevers / chills, nausea / vomiting, diarrhea.  Reports predominately dry cough with occasional blood tinged sputum production.     Past Medical History  SCLC - confirmed on RUL bx in 05/2018 Tobacco Abuse - quit 2017 CAD s/p PCA x2 DM II  COPD  PE - on Eliquis  CKD III  Significant Hospital Events   5/4-5/8 Admit for cerebellar infarct  5/08 Admit to CIR  5/11 PCCM consulted for hemoptysis   Consults:  PCCM 5/11   Procedures:     Significant Diagnostic Tests:  CT Chest / ABD / Pelvis 5/6 >>  RLL, LLL nodules reduced in conspicuity, possible radiation pneumonitis, complete occlusion of RUL bronchus, airway thickening, new 7mm LUL nodule  Micro Data:    Antimicrobials:    Interim history/subjective:  As above.   Objective   Blood pressure (!) 139/51, pulse 71, temperature 98.2 F (36.8 C), temperature source Oral, resp. rate 19, weight 92.4 kg, SpO2 94 %.        Intake/Output Summary (Last 24 hours) at 03/22/2019 0958 Last data filed at 03/22/2019 0911 Gross per 24 hour  Intake 800 ml  Output 1500 ml  Net -700 ml   Filed Weights   03/20/19 0531 03/21/19 0520 03/22/19 0504  Weight: 92.5 kg 92.7 kg 92.4 kg    Examination: General: pleasant elderly male lying in bed talking on cell phone  HEENT: MM pink/moist, no jvd Neuro: AAOx4, speech clear, MAE  CV: s1s2 rrr, no m/r/g PULM: even/non-labored, lungs bilaterally with faint high pitched expiratory wheeze  SA:YTKZ, non-tender, bsx4 active  Extremities: warm/dry, no edema  Skin: no rashes or lesions  Resolved Hospital Problem list      Assessment & Plan:   Hemoptysis  -Difficult situation with hx of recent embolic cerebellar infarct (03/15/19), known coronary stent x2 and recent DVT (5/6) on Eliquis + ASA.  Fortunately, his hemoptysis appears to be small volume and Hgb is stable.  P: Recommend continue current eliquis + ASA > reviewed with Neurology / CCM MD.  Risk of bleeding off  anticoagulation  ~ 15% Monitor volume of hemoptysis > add measured clear container to record volume  If large volume bleeding (>262ml per day), would consider IR evaluation for embolization Cough suppression with delsym Q12, if severe cough PRN tussionex but trying to avoid any sedating medications given recent CVA   Best practice:  Diet: Per primary  DVT prophylaxis: Eliquis + ASA GI prophylaxis: n/a Glucose control: per primary  Mobility: As tolerated / per CIR  Code Status: Full Code  Family Communication: Patient updated on  plan of care Disposition: Inpatient rehab  Labs   CBC: Recent Labs  Lab 03/15/19 1036 03/16/19 0617 03/17/19 0426 03/18/19 0432 03/19/19 0359 03/22/19 0702  WBC 9.6 10.2 8.8 8.6 8.6 8.0  NEUTROABS 8.6*  --  7.1  --   --  6.0  HGB 14.1 12.4* 13.2 13.5 12.3* 13.6  HCT 45.6 40.4 42.3 43.3 40.1 45.0  MCV 86.5 86.7 86.0 85.2 85.5 86.4  PLT 277 261 235 228 218 643    Basic Metabolic Panel: Recent Labs  Lab 03/16/19 0617 03/17/19 0426 03/18/19 0432 03/19/19 0819 03/22/19 0702  NA 144 142 140 141 141  K 4.9 4.7 4.8 4.4 5.0  CL 111 108 106 106 109  CO2 24 26 24 26 25   GLUCOSE 164* 180* 191* 156* 159*  BUN 42* 33* 32* 39* 27*  CREATININE 1.60* 1.39* 1.51* 1.64* 1.30*  CALCIUM 8.6* 8.7* 8.8* 8.7* 9.1  MG  --  2.0 1.9  --   --   PHOS  --  3.0  --   --   --    GFR: Estimated Creatinine Clearance: 58.9 mL/min (A) (by C-G formula based on SCr of 1.3 mg/dL (H)). Recent Labs  Lab 03/17/19 0426 03/18/19 0432 03/19/19 0359 03/22/19 0702  WBC 8.8 8.6 8.6 8.0    Liver Function Tests: Recent Labs  Lab 03/15/19 1036 03/16/19 0617 03/17/19 0426 03/22/19 0702  AST 25 17  --  15  ALT 24 17  --  13  ALKPHOS 57 48  --  61  BILITOT 0.5 0.5  --  0.5  PROT 7.1 5.6*  --  6.5  ALBUMIN 2.7* 2.2* 2.3* 2.6*   Recent Labs  Lab 03/15/19 1036  LIPASE 30   No results for input(s): AMMONIA in the last 168 hours.  ABG    Component Value Date/Time   TCO2 21 04/26/2015 1031     Coagulation Profile: Recent Labs  Lab 03/15/19 1036  INR 1.5*    Cardiac Enzymes: Recent Labs  Lab 03/15/19 1036  TROPONINI <0.03    HbA1C: Hgb A1c MFr Bld  Date/Time Value Ref Range Status  03/16/2019 06:17 AM 8.1 (H) 4.8 - 5.6 % Final    Comment:    (NOTE) Pre diabetes:          5.7%-6.4% Diabetes:              >6.4% Glycemic control for   <7.0% adults with diabetes   10/26/2009 03:30 PM (H) 4.6 - 6.1 % Final   6.7 (NOTE) The ADA recommends the following therapeutic goal for  glycemic control related to Hgb A1c measurement: Goal of therapy: <6.5 Hgb A1c  Reference: American Diabetes Association: Clinical Practice Recommendations 2010, Diabetes Care, 2010, 33: (Suppl  1).    CBG: Recent Labs  Lab 03/21/19 0603 03/21/19 1149 03/21/19 1626 03/21/19 2101 03/22/19 0620  GLUCAP 140* 171* 115* 198* 134*    Review of Systems:   Gen: Denies fever,  chills, weight change, fatigue, night sweats HEENT: Denies blurred vision, double vision, hearing loss, tinnitus, sinus congestion, rhinorrhea, sore throat, neck stiffness, dysphagia PULM: Denies shortness of breath, cough, sputum production, hemoptysis - pt reports blood tinged sputum, wheezing CV: Denies chest pain, edema, orthopnea, paroxysmal nocturnal dyspnea, palpitations GI: Denies abdominal pain, nausea, vomiting, diarrhea, hematochezia, melena, constipation, change in bowel habits GU: Denies dysuria, hematuria, polyuria, oliguria, urethral discharge Endocrine: Denies hot or cold intolerance, polyuria, polyphagia or appetite change Derm: Denies rash, dry skin, scaling or peeling skin change Heme: Denies easy bruising, bleeding, bleeding gums Neuro: Denies headache, numbness, weakness, slurred speech, loss of memory or consciousness  Past Medical History  He,  has a past medical history of CAD (coronary artery disease), Cancer ([place]), COPD (chronic obstructive pulmonary disease) ([place]), Coronary artery disease, Diabetes mellitus, Hyperlipemia, Hypertension, Kidney disease, Myocardial infarction ([place]), Osteoarthritis, Peripheral vascular disease ([place]), and Tobacco abuse.   Surgical History    Past Surgical History:  Procedure Laterality Date  . APPENDECTOMY    . CHOLECYSTECTOMY    . CORONARY ANGIOPLASTY     stent times 2, last cath 3 years ago  . FUDUCIAL PLACEMENT N/A 06/09/2018   Procedure: possible, PLACEMENT OF FUDUCIAL;  Surgeon: Grace Isaac, MD;  Location: [place];  Service: Thoracic;  Laterality:  N/A;  . NERVE EXPLORATION  07/29/2012   Procedure: NERVE EXPLORATION;  Surgeon: Schuyler Amor, MD;  Location: [place];  Service: Orthopedics;  Laterality: Left;  Exploration /repair nerve and tendon left thumb  . PERIPHERAL VASCULAR CATHETERIZATION N/A 04/26/2015   Procedure: Abdominal Aortogram;  Surgeon: Rosetta Posner, MD;  Location: [place] [facility];  Service: Cardiovascular;  Laterality: N/A;  . VIDEO BRONCHOSCOPY N/A 06/09/2018   Procedure: VIDEO BRONCHOSCOPY;  Surgeon: Grace Isaac, MD;  Location: [place];  Service: Thoracic;  Laterality: N/A;  . VIDEO BRONCHOSCOPY WITH ENDOBRONCHIAL NAVIGATION N/A 06/09/2018   Procedure: VIDEO BRONCHOSCOPY WITH ENDOBRONCHIAL NAVIGATION;  Surgeon: Grace Isaac, MD;  Location: [place];  Service: Thoracic;  Laterality: N/A;     Social History   reports that he quit smoking about 3 years ago. His smoking use included cigarettes. He has a 25.00 pack-year smoking history. He quit smokeless tobacco use about 4 years ago.  His smokeless tobacco use included chew. He reports that he does not drink alcohol or use drugs.   Family History   His family history includes Cancer in his father; Coronary artery disease in his mother; Diabetes in his brother; Heart attack in his brother; Heart disease in his brother and mother; Heart failure in an other family member; Hypertension in his brother and sister; Peripheral vascular disease in his brother; Pulmonary embolism in an other family member; Stroke in an other family member.   Allergies Allergies  Allergen Reactions  . Penicillins Other (See Comments)    UNSPECIFIED CHILDHOOD REACTION Has patient had PCN reaction causing immediate rash, facial/tongue/throat swelling, SOB or lightheadedness with hypotension: Unknown Has patient had a PCN reaction causing severe rash involving mucus membranes or skin necrosis: Unknown Has patient had a PCN reaction that required hospitalization: No Has  patient had a PCN reaction occurring within the last 10 years: No If all of the above answers are "NO", then may proceed with Cephalosporin use.      Home Medications  Prior to Admission medications   Medication Sig Start Date End Date Taking? Authorizing Provider  acetaminophen (TYLENOL) 325 MG tablet Take 2 tablets (650 mg  total) by mouth every 4 (four) hours as needed for mild pain (or temp > 37.5 C (99.5 F)). 03/19/19   Lavina Hamman, MD  albuterol [facility] HFA) [phone] Base) MCG/ACT inhaler Inhale 2 puffs into the lungs every 6 (six) hours as needed for wheezing or shortness of breath.    [provider]  allopurinol (ZYLOPRIM) 100 MG tablet Take 100 mg by mouth 2 (two) times daily.     [provider]  amLODipine (NORVASC) 10 MG tablet Take 10 mg by mouth daily.      [provider]  aspirin EC 81 MG tablet Take 1 tablet (81 mg total) by mouth daily. 12/10/18   Burnell Blanks, MD  benazepril (LOTENSIN) 40 MG tablet Take 40 mg by mouth at bedtime.     [provider]  benzonatate (TESSALON) 100 MG capsule Take 100 mg by mouth 3 (three) times daily as needed for cough.  02/24/19   [provider]  calcitRIOL (ROCALTROL) 0.25 MCG capsule Take 0.25 mcg by mouth daily.  05/28/18   [provider]  Cinnamon 500 MG capsule Take 500 mg by mouth daily.    [provider]  doxycycline (VIBRA-TABS) 100 MG tablet Take 1 tablet (100 mg total) by mouth 2 (two) times daily for 4 days. 03/19/19 03/23/19  Lavina Hamman, MD  guaiFENesin (MUCINEX) 600 MG 12 hr tablet Take 1 tablet (600 mg total) by mouth 2 (two) times daily. 03/19/19   Lavina Hamman, MD  insulin aspart (NOVOLOG) 100 UNIT/ML injection Inject 0-15 Units into the skin 3 (three) times daily with meals. 03/19/19   Lavina Hamman, MD  metoprolol (LOPRESSOR) 100 MG tablet Take 100 mg by mouth 2 (two) times daily.      [provider]  mirtazapine (REMERON) 15 MG tablet Take  15 mg by mouth at bedtime. 07/30/16   [provider]  Rivaroxaban 15 & 20 MG TBPK Take as directed on package: Start with one 15mg  tablet by mouth twice a day with food. On Day 22, switch to one 20mg  tablet once a day with food. 03/19/19   Lavina Hamman, MD  Turmeric 400 MG CAPS Take 400 mg by mouth daily.     [provider]          Noe Gens, NP-C [facility] Pgr: [phone] or if no answer [phone] 03/22/2019, 9:58 AM

## 2019-03-22 NOTE — Progress Notes (Signed)
Speech Language Pathology Daily Session Note  Patient Details  Name: Travis [name] MRN: [id number] Date of Birth: 1951/08/03  Today's Date: 03/22/2019 SLP Individual Time: 0730-0830 SLP Individual Time Calculation (min): 60 min  Short Term Goals: Week 1: SLP Short Term Goal 1 (Week 1): Given supervision cues, pt wil recall speech intelligibility strategies in 8 out of 10 opportunities.  SLP Short Term Goal 2 (Week 1): Given Min A cues, pt will utilize speech intelligibility strategies at the sentence level to achieve ~ 90% intelligibility.   Skilled Therapeutic Interventions:  Skilled treatment session focused on communication goals. SLP facilitated session by providing supervision cues to recall speech intelligibility strategies. Pt able to achieve ~ 90% speech intelligibility during unstructured description task with improvement to > 95% with supervision cues. Additionally, pt with increase independent self-monitoring abilities and able to demonstrate self-corrections during moments of decreased intelligibility. Finally, SLP provided education on general situations that pose increased difficulty to implement speech intelligibility strategies (I.e., structured vs unstructured communication, communicating with distractions or noise present, communicating information over telephone). Pt voiced understanding and all questions answered to pt's satisfaction. Pt left in bed, bed alarm on and all needs within reach. Continue per current plan of care.      Pain    Therapy/Group: Individual Therapy  Travis [name] 03/22/2019, 8:22 AM

## 2019-03-22 NOTE — Progress Notes (Signed)
Inpatient Rehabilitation  Patient information reviewed and entered into eRehab system by Nareh Matzke M. Windsor Zirkelbach, M.A., CCC/SLP, PPS Coordinator.  Information including medical coding, functional ability and quality indicators will be reviewed and updated through discharge.    

## 2019-03-23 ENCOUNTER — Inpatient Hospital Stay (HOSPITAL_COMMUNITY): Payer: Medicare Other | Admitting: Occupational Therapy

## 2019-03-23 ENCOUNTER — Encounter (HOSPITAL_COMMUNITY): Payer: Self-pay | Admitting: Oncology

## 2019-03-23 ENCOUNTER — Inpatient Hospital Stay (HOSPITAL_COMMUNITY): Payer: Medicare Other | Admitting: Physical Therapy

## 2019-03-23 ENCOUNTER — Inpatient Hospital Stay (HOSPITAL_COMMUNITY): Payer: Medicare Other

## 2019-03-23 DIAGNOSIS — I69328 Other speech and language deficits following cerebral infarction: Secondary | ICD-10-CM

## 2019-03-23 DIAGNOSIS — I129 Hypertensive chronic kidney disease with stage 1 through stage 4 chronic kidney disease, or unspecified chronic kidney disease: Secondary | ICD-10-CM

## 2019-03-23 DIAGNOSIS — Z955 Presence of coronary angioplasty implant and graft: Secondary | ICD-10-CM

## 2019-03-23 DIAGNOSIS — Z86718 Personal history of other venous thrombosis and embolism: Secondary | ICD-10-CM

## 2019-03-23 DIAGNOSIS — J90 Pleural effusion, not elsewhere classified: Secondary | ICD-10-CM

## 2019-03-23 DIAGNOSIS — Z880 Allergy status to penicillin: Secondary | ICD-10-CM

## 2019-03-23 DIAGNOSIS — Z86711 Personal history of pulmonary embolism: Secondary | ICD-10-CM

## 2019-03-23 DIAGNOSIS — I252 Old myocardial infarction: Secondary | ICD-10-CM

## 2019-03-23 DIAGNOSIS — Z8249 Family history of ischemic heart disease and other diseases of the circulatory system: Secondary | ICD-10-CM

## 2019-03-23 DIAGNOSIS — J9819 Other pulmonary collapse: Secondary | ICD-10-CM

## 2019-03-23 DIAGNOSIS — E278 Other specified disorders of adrenal gland: Secondary | ICD-10-CM

## 2019-03-23 DIAGNOSIS — E1122 Type 2 diabetes mellitus with diabetic chronic kidney disease: Secondary | ICD-10-CM

## 2019-03-23 DIAGNOSIS — Z823 Family history of stroke: Secondary | ICD-10-CM

## 2019-03-23 DIAGNOSIS — E1151 Type 2 diabetes mellitus with diabetic peripheral angiopathy without gangrene: Secondary | ICD-10-CM

## 2019-03-23 DIAGNOSIS — Z85118 Personal history of other malignant neoplasm of bronchus and lung: Secondary | ICD-10-CM

## 2019-03-23 DIAGNOSIS — Z833 Family history of diabetes mellitus: Secondary | ICD-10-CM

## 2019-03-23 DIAGNOSIS — I69351 Hemiplegia and hemiparesis following cerebral infarction affecting right dominant side: Secondary | ICD-10-CM

## 2019-03-23 DIAGNOSIS — Z825 Family history of asthma and other chronic lower respiratory diseases: Secondary | ICD-10-CM

## 2019-03-23 DIAGNOSIS — I2510 Atherosclerotic heart disease of native coronary artery without angina pectoris: Secondary | ICD-10-CM

## 2019-03-23 DIAGNOSIS — Z85828 Personal history of other malignant neoplasm of skin: Secondary | ICD-10-CM

## 2019-03-23 DIAGNOSIS — Z923 Personal history of irradiation: Secondary | ICD-10-CM

## 2019-03-23 LAB — ANTITHROMBIN III: AntiThromb III Func: 84 % (ref 75–120)

## 2019-03-23 LAB — GLUCOSE, CAPILLARY
Glucose-Capillary: 125 mg/dL — ABNORMAL HIGH (ref 70–99)
Glucose-Capillary: 174 mg/dL — ABNORMAL HIGH (ref 70–99)
Glucose-Capillary: 185 mg/dL — ABNORMAL HIGH (ref 70–99)
Glucose-Capillary: 184 mg/dL — ABNORMAL HIGH (ref 70–99)

## 2019-03-23 NOTE — Progress Notes (Signed)
NAME:  Travis [name], MRN:  [id number], DOB:  [birth year], LOS: 4 ADMISSION DATE:  03/19/2019, CONSULTATION DATE:  03/22/19 REFERRING MD:  Dr. Ella Bodo, CHIEF COMPLAINT:  Hemoptysis   Brief History   68 y/o M admitted to [facility] inpatient rehab on 5/8 after admission from 5/4-5/8 for right cerebellar infarct without hemorrhage. Hospital course notable for L DVT which was treated with , CT with RLL, LLL nodules, complete occlusion of RUL bronchus.  PCCM called for hemoptysis 5/11 for evaluation.   Past Medical History  SCLC - confirmed on RUL bx in 05/2018 Tobacco Abuse - quit 2017 CAD s/p PCA x2 DM II  COPD  PE - on Eliquis  CKD III  Significant Hospital Events   5/4-5/8 Admit for cerebellar infarct  5/08 Admit to CIR  5/11 PCCM consulted for hemoptysis  5/12 Hemoptysis improving   Consults:  PCCM 5/11   Procedures:     Significant Diagnostic Tests:  CT Chest / ABD / Pelvis 5/6 >> RLL, LLL nodules reduced in conspicuity, possible radiation pneumonitis, complete occlusion of RUL bronchus, airway thickening, new 46mm LUL nodule  Micro Data:    Antimicrobials:    Interim history/subjective:  Pt denies complaints, reports reduced blood in sputum   Objective   Blood pressure (!) 153/73, pulse 69, temperature 98.5 F (36.9 C), temperature source Oral, resp. rate 19, weight 93.1 kg, SpO2 98 %.        Intake/Output Summary (Last 24 hours) at 03/23/2019 1038 Last data filed at 03/23/2019 0835 Gross per 24 hour  Intake 594 ml  Output 1700 ml  Net -1106 ml   Filed Weights   03/21/19 0520 03/22/19 0504 03/23/19 0532  Weight: 92.7 kg 92.4 kg 93.1 kg    Examination: General: elderly male lying in bed in NAD HEENT: MM pink/moist, no jvd Neuro: AAOx4, speech clear, MAE  CV: s1s2 rrr, no m/r/g PULM: even/non-labored, lungs bilaterally clear  XB:DZHG, non-tender, bsx4 active  Extremities: warm/dry, no edema  Skin: no rashes or lesions  Resolved Hospital Problem list       Assessment & Plan:   Hemoptysis  -Difficult situation with hx of recent embolic cerebellar infarct (03/15/19), known coronary stent x2 and recent DVT (5/6) on Eliquis + ASA.  Fortunately, his hemoptysis appears to be small volume and Hgb is stable.  P: Continue Eliquis + ASA  Monitor for further hemoptysis If large volume hemoptysis (>200 ml/24 hours) consider IR embolization Continue anti-tussive agents  Care management consulted to help with getting records of prior chest CT from the [place]  Best practice:  Diet: Per primary  DVT prophylaxis: Eliquis + ASA GI prophylaxis: n/a Glucose control: per primary  Mobility: As tolerated / per CIR  Code Status: Full Code  Family Communication: Patient updated on plan of care 5/12 Disposition: Inpatient rehab  Labs   CBC: Recent Labs  Lab 03/17/19 0426 03/18/19 0432 03/19/19 0359 03/22/19 0702  WBC 8.8 8.6 8.6 8.0  NEUTROABS 7.1  --   --  6.0  HGB 13.2 13.5 12.3* 13.6  HCT 42.3 43.3 40.1 45.0  MCV 86.0 85.2 85.5 86.4  PLT 235 228 218 992    Basic Metabolic Panel: Recent Labs  Lab 03/17/19 0426 03/18/19 0432 03/19/19 0819 03/22/19 0702  NA 142 140 141 141  K 4.7 4.8 4.4 5.0  CL 108 106 106 109  CO2 26 24 26 25   GLUCOSE 180* 191* 156* 159*  BUN 33* 32* 39* 27*  CREATININE 1.39* 1.51*  1.64* 1.30*  CALCIUM 8.7* 8.8* 8.7* 9.1  MG 2.0 1.9  --   --   PHOS 3.0  --   --   --    GFR: Estimated Creatinine Clearance: 58.9 mL/min (A) (by C-G formula based on SCr of 1.3 mg/dL (H)). Recent Labs  Lab 03/17/19 0426 03/18/19 0432 03/19/19 0359 03/22/19 0702  WBC 8.8 8.6 8.6 8.0    Liver Function Tests: Recent Labs  Lab 03/17/19 0426 03/22/19 0702  AST  --  15  ALT  --  13  ALKPHOS  --  61  BILITOT  --  0.5  PROT  --  6.5  ALBUMIN 2.3* 2.6*   No results for input(s): LIPASE, AMYLASE in the last 168 hours. No results for input(s): AMMONIA in the last 168 hours.  ABG    Component Value Date/Time   TCO2 21 04/26/2015  1031     Coagulation Profile: No results for input(s): INR, PROTIME in the last 168 hours.  Cardiac Enzymes: No results for input(s): CKTOTAL, CKMB, CKMBINDEX, TROPONINI in the last 168 hours.  HbA1C: Hgb A1c MFr Bld  Date/Time Value Ref Range Status  03/16/2019 06:17 AM 8.1 (H) 4.8 - 5.6 % Final    Comment:    (NOTE) Pre diabetes:          5.7%-6.4% Diabetes:              >6.4% Glycemic control for   <7.0% adults with diabetes   10/26/2009 03:30 PM (H) 4.6 - 6.1 % Final   6.7 (NOTE) The ADA recommends the following therapeutic goal for glycemic control related to Hgb A1c measurement: Goal of therapy: <6.5 Hgb A1c  Reference: American Diabetes Association: Clinical Practice Recommendations 2010, Diabetes Care, 2010, 33: (Suppl  1).    CBG: Recent Labs  Lab 03/22/19 0620 03/22/19 1154 03/22/19 1640 03/22/19 2118 03/23/19 0627  GLUCAP 134* 172* 133* 155* 125*         Noe Gens, NP-C [place] [facility] Pgr: [phone] or if no answer [phone] 03/23/2019, 10:38 AM

## 2019-03-23 NOTE — Plan of Care (Signed)
  Problem: Consults Goal: RH STROKE PATIENT EDUCATION Description See Patient Education module for education specifics  Outcome: Progressing Goal: Nutrition Consult-if indicated Outcome: Progressing Goal: Diabetes Guidelines if Diabetic/Glucose > 140 Description If diabetic or lab glucose is > 140 mg/dl - Initiate Diabetes/Hyperglycemia Guidelines & Document Interventions  Outcome: Progressing   Problem: RH BOWEL ELIMINATION Goal: RH STG MANAGE BOWEL WITH ASSISTANCE Description STG Manage Bowel with min Assistance.  Outcome: Progressing Goal: RH STG MANAGE BOWEL W/MEDICATION W/ASSISTANCE Description STG Manage Bowel with Medication with min Assistance.  Outcome: Progressing   Problem: RH BLADDER ELIMINATION Goal: RH STG MANAGE BLADDER WITH ASSISTANCE Description STG Manage Bladder With supervision.  Outcome: Progressing   Problem: RH SKIN INTEGRITY Goal: RH STG SKIN FREE OF INFECTION/BREAKDOWN Outcome: Progressing Goal: RH STG MAINTAIN SKIN INTEGRITY WITH ASSISTANCE Description STG Maintain Skin Integrity With min Assistance.  Outcome: Progressing   Problem: RH SAFETY Goal: RH STG ADHERE TO SAFETY PRECAUTIONS W/ASSISTANCE/DEVICE Description STG Adhere to Safety Precautions With min Assistance/Device.  Outcome: Progressing Goal: RH STG DECREASED RISK OF FALL WITH ASSISTANCE Description STG Decreased Risk of Fall With min Assistance.  Outcome: Progressing   Problem: RH COGNITION-NURSING Goal: RH STG USES MEMORY AIDS/STRATEGIES W/ASSIST TO PROBLEM SOLVE Description STG Uses Memory Aids/Strategies With supervision to Problem Solve.  Outcome: Progressing Goal: RH STG ANTICIPATES NEEDS/CALLS FOR ASSIST W/ASSIST/CUES Description STG Anticipates Needs/Calls for Assist With supervision.  Outcome: Progressing   Problem: RH PAIN MANAGEMENT Goal: RH STG PAIN MANAGED AT OR BELOW PT'S PAIN GOAL Description Pain less than or equal to 2.  Outcome: Progressing   Problem:  RH KNOWLEDGE DEFICIT Goal: RH STG INCREASE KNOWLEDGE OF DIABETES Description Patient will be able to verbalize management of T2DM including target CBG and medication/diet management with cues/handouts  Outcome: Progressing Goal: RH STG INCREASE KNOWLEDGE OF HYPERTENSION Description Patient will be able to describe management of hypertension with cues/handouts  Outcome: Progressing Goal: RH STG INCREASE KNOWLEDGE OF STROKE PROPHYLAXIS Description Patient will be able to describe measures to prevent stroke including diet/exercise and medication with cues/handouts  Outcome: Progressing

## 2019-03-23 NOTE — Progress Notes (Signed)
Speech Language Pathology Daily Session Note  Patient Details  Name: Travis [name] MRN: [id number] Date of Birth: 30-Jul-1951  Today's Date: 03/23/2019 SLP Individual Time: 1440-1535 SLP Individual Time Calculation (min): 55 min  Short Term Goals: Week 1: SLP Short Term Goal 1 (Week 1): Given supervision cues, pt wil recall speech intelligibility strategies in 8 out of 10 opportunities.  SLP Short Term Goal 2 (Week 1): Given Min A cues, pt will utilize speech intelligibility strategies at the sentence level to achieve ~ 90% intelligibility.   Skilled Therapeutic Interventions: Skilled ST services focused on speech skills. SLP facilitated recall of speech intelligibility strategies, pt required supervision A verbal cues. SLP facilitated utilization of speech intelligibility strategies at sentence level in structured verse unstructured tasks, pt demonstrated 95% intelligibility with supervision A verbal cues. SLP facilitated utilization of speech intelligibility strategies in conversation pt demonstrated 80% intelligibility with mod A fading min A verbal cues for use of speech intelligibility strategies. Pt demonstrated awareness with min A verbal cues and ability to correct verbal errors. SLP assisted pt in accessing video chat on smart phone to communicate with wife, whom is hard of hearing. Pt's wife request for pt to repeat verbal message on two occurences. Pt agreed that over-atriculation and slowing rate is very important for effective communication with wife.  Pt was left in room with call bell within reach and chair alarm set. ST recommends to continue skilled ST services.      Pain Pain Assessment Pain Score: 0-No pain  Therapy/Group: Individual Therapy  Parilee Hally  [facility] 03/23/2019, 3:41 PM

## 2019-03-23 NOTE — Progress Notes (Signed)
Occupational Therapy Session Note  Patient Details  Name: Travis [name] MRN: [id number] Date of Birth: Jun 21, 1951  Today's Date: 03/23/2019 OT Individual Time: 9563-8756 OT Individual Time Calculation (min): 84 min    Short Term Goals: Week 1:  OT Short Term Goal 1 (Week 1): STGs=LTGs due to ELOS  Skilled Therapeutic Interventions/Progress Updates:    Upon entering the room, pt supine in bed with no c/o pain. Pt declined bathing and dressing this session and requesting to work on hands for handwriting specifically. Pt ambulating with min guard and use of RW 150' to day room. Pt continues to be on 1 L O2 via [place] with oxygen remaining above 95% during session. Pt seated for fine motor activities and pt able to demonstrate isolated finger movements and pairs on R hand with increased time and multiple attempts sometimes needed but able to perform correctly. Pt given lined paper to use R UE to connect line to line with errors on 15/20 lines drawn. Pt also given mazes of varing difficulty and able to complete with pencil with min verbal cuing for technique and to slow down hand movements. Pt's signature improved by the end of session and pt given lined paper and pen/pencil to continue working on while in the room. Pt ambulating back to room in same manner as above with 1 LOB while turning the corner to the R needing min A to correct. Pt seated at sink for grooming tasks secondary to fatigue. OT returning to recliner chair with min guard for stand pivot transfer with use of RW . OT began to review stroke risk notebook as well with education to continue. Chair alarm belt donned and call bell within reach upon exiting the room.   Therapy Documentation Precautions:  Precautions Precautions: Fall Precaution Comments: Ataxic, LEs>UEs Restrictions Weight Bearing Restrictions: No   Pain: Pain Assessment Pain Scale: 0-10 Pain Score: 0-No pain ADL: ADL Eating: Not assessed Grooming: Contact guard Where  Assessed-Grooming: Standing at sink Upper Body Bathing: Supervision/safety Where Assessed-Upper Body Bathing: Edge of bed Lower Body Bathing: Minimal assistance Where Assessed-Lower Body Bathing: Edge of bed Upper Body Dressing: Supervision/safety Where Assessed-Upper Body Dressing: Edge of bed Lower Body Dressing: Minimal assistance Where Assessed-Lower Body Dressing: Edge of bed Toileting: Minimal assistance Where Assessed-Toileting: Bedside Commode Toilet Transfer: Minimal assistance Toilet Transfer Method: Insurance claims handler Equipment: Grab bars(RW) Tub/Shower Transfer: Not assessed   Therapy/Group: Individual Therapy  [name] 03/23/2019, 11:54 AM

## 2019-03-23 NOTE — Progress Notes (Addendum)
[place] PHYSICAL MEDICINE & REHABILITATION PROGRESS NOTE   Subjective/Complaints: Appreciate pulmonary note, also discussed patient for coagulability with PCP Dr. Joylene Draft. Patient was on Eliquis for history of PE when he developed both cerebellar infarct as well as tibial vein DVT  ROS: Patient denies , nausea, vomiting, diarrhea,   shortness of breath or chest pain, joint or back pain,  Objective:   No results found. Recent Labs    03/22/19 0702  WBC 8.0  HGB 13.6  HCT 45.0  PLT 275   Recent Labs    03/22/19 0702  NA 141  K 5.0  CL 109  CO2 25  GLUCOSE 159*  BUN 27*  CREATININE 1.30*  CALCIUM 9.1    Intake/Output Summary (Last 24 hours) at 03/23/2019 5956 Last data filed at 03/23/2019 0835 Gross per 24 hour  Intake 594 ml  Output 1700 ml  Net -1106 ml     Physical Exam: Vital Signs Blood pressure (!) 153/73, pulse 69, temperature 98.5 F (36.9 C), temperature source Oral, resp. rate 19, weight 93.1 kg, SpO2 98 %. Constitutional: No distress . Vital signs reviewed. HEENT: EOMI, oral membranes moist Neck: supple Cardiovascular: RRR without murmur. No JVD    Respiratory: occasional exp wheeze, intermittent productive cough  GI: BS +, non-tender, non-distended   Neurological: He isalertand oriented to person, place, and time. He displaysnormal reflexes. Nocranial nerve deficit. He exhibitsnormal muscle tone.Coordinationabnormal.  Mild right facial weakness with /mild dysarthria .  Good insight and awareness.  Patient frustrated by dysarthria. Ataxia RUE>RLE with FTN and HTS. Strength 4+/5 RUE and RLE and 5/5 LUE and LLE. No sensory findings. Right upper extremity dysmetria improved to a mild/moderate level Skin: Skin iswarmand dry. He isnot diaphoretic.  Psychiatric: pleasant.     Assessment/Plan: 1. Functional deficits secondary to right cerebellar infarct which require 3+ hours per day of interdisciplinary therapy in a comprehensive inpatient  rehab setting.  Physiatrist is providing close team supervision and 24 hour management of active medical problems listed below.  Physiatrist and rehab team continue to assess barriers to discharge/monitor patient progress toward functional and medical goals  Care Tool:  Bathing    Body parts bathed by patient: Right arm, Left arm, Chest, Abdomen, Front perineal area, Buttocks, Right upper leg, Left upper leg, Right lower leg, Left lower leg, Face         Bathing assist Assist Level: Contact Guard/Touching assist     Upper Body Dressing/Undressing Upper body dressing   What is the patient wearing?: Pull over shirt    Upper body assist Assist Level: Supervision/Verbal cueing    Lower Body Dressing/Undressing Lower body dressing            Lower body assist Assist for lower body dressing: Contact Guard/Touching assist     Toileting Toileting Toileting Activity did not occur (Clothing management and hygiene only): N/A (no void or bm)  Toileting assist Assist for toileting: Contact Guard/Touching assist Assistive Device Comment: urinal    Transfers Chair/bed transfer  Transfers assist     Chair/bed transfer assist level: Moderate Assistance - Patient 50 - 74%     Locomotion Ambulation   Ambulation assist      Assist level: Moderate Assistance - Patient 50 - 74% Assistive device: No Device Max distance: 80   Walk 10 feet activity   Assist     Assist level: Moderate Assistance - Patient - 50 - 74% Assistive device: No Device   Walk 50 feet activity  Assist    Assist level: Moderate Assistance - Patient - 50 - 74% Assistive device: No Device    Walk 150 feet activity   Assist Walk 150 feet activity did not occur: Safety/medical concerns         Walk 10 feet on uneven surface  activity   Assist Walk 10 feet on uneven surfaces activity did not occur: Safety/medical concerns         Wheelchair     Assist   Type of  Wheelchair: Manual    Wheelchair assist level: Supervision/Verbal cueing Max wheelchair distance: 123ft    Wheelchair 50 feet with 2 turns activity    Assist        Assist Level: Supervision/Verbal cueing   Wheelchair 150 feet activity     Assist     Assist Level: Supervision/Verbal cueing    Medical Problem List and Plan: 1.Functional and mobility deficitssecondary to right cerebellar infarct -continue CIR therapies today including PT, OT, and SLP  Team conference in a.m. 2. Antithrombotics: -Acute leftDVT/PE dx-10/2018/anticoagulation:Pharmaceutical:Other (comment)--Eliquis treatment dose X 2 weeks followed by maintaince dose. Dr. Joylene Draft had questions regarding possible need for chronic subcu Lovenox treatment doses, will defer to hematology -antiplatelet therapy: ASA 3. Pain Management:N/A 4. Mood:LCSW to follow for evaluation and support. -antipsychotic agents: N/A 5. Neuropsych: This patientiscapable of making decisions on hisown behalf. 6. Skin/Wound Care:Routine pressure relief measures. 7. Fluids/Electrolytes/Nutrition:Monitor I/O. Check lytes in am.  8. HTN: Monitor BP tid. Continue Benazepril, Metoprolol and Amlodipine. Lasix on hold.   -fair control 5/10 9. CKD Stage III: Baseline SCr around 2.2. BUN/SCr- 53/2.05 at admission--now down to 39/1.64. Avoid nephrotoxic medications.   -Avoid hypoperfusionpost-stroke.  10. Nausea/Vomiting:Denies symptoms at present 11. CAD/PVD: On low dose ASA, Lipitor, Eliquis and BB 12. T2DM: Monitor BS ac/hs --Was on Tujeo 300- 27 units daily with humalog 11 units bid lunch/supper. Continue SSI for now  - CBG (last 3)  Recent Labs    03/22/19 1640 03/22/19 2118 03/23/19 0627  GLUCAP 133* 155* 125*    -controlled   -began lantus 5 units daily in place of Tujeo on 5/9---observe today 13. Squamous cell lung cancer: S/p XRT 14. Recent PNA/Hypoxia:Was completing  course of Levaquin at admission. afebrile -continued use of flutter valve.  -cxr from 5/4 with increased right hilar mass, question recurrence of carcinoma, pulmonary follow-up.  The patient also may have a hypercoagulable state from a paraneoplastic syndrome, will ask hematology to evaluate -doxycycline bid day# 6  -add mucinex  -encourage FV/IS/OOB  -Appreciate Dr. [name], pulmonary input  LOS: 4 days A FACE TO [place] 03/23/2019, 9:21 AM

## 2019-03-23 NOTE — Consult Note (Addendum)
[place]  [phone] [phone]    [place]  Referring MD:  Dr. Alysia Penna  Reason for Referral: Anticoagulation management  HPI: Mr. Ferdig is a 68 year old Caucasian male with a past medical history including stage Ia non-small cell lung cancer, squamous cell status post radiation and [facility], history of MI status post stent, stage III CKD, right cerebellar infarct, left lower extremity DVT, PE.  The patient is currently admitted to the [facility] inpatient rehab after admission from 03/15/2019 through 03/19/2019 for a right cerebellar infarct without hemorrhage.  The patient presented with nausea, vomiting, dizziness.  He also had some expressive aphasia.  During his hospitalization, he was also found to have a left lower extremity DVT.  Prior to admission, he had had a pulmonary embolism and had been on Eliquis 5 mg twice a day at the time of his CVA and development of left lower extremity DVT.  Due to concern for underlying malignancy, he underwent a CT of the chest, abdomen, pelvis during his hospitalization which showed both the right lower lobe pulmonary nodule in the left lower lobe pulmonary nodule reduced in conspicuity with some degree of surrounding airspace opacity, possible radiation pneumonitis, complete occlusion of the right upper lobe bronchus.  Most of the right upper lobe is collapsed potentially with a small amount of airspace filling process with air trapping in portions of the right upper lobe anteriorly, airway thickening centrally in the right lung resulting in some luminal narrowing of the bronchus intermedius, a new 4 mm triangular-shaped left upper lobe pulmonary nodule along with new small groundglass density nodule in the left upper lobe which merits surveillance, mildly enlarged paratracheal and subcarinal lymph nodes likewise merit surveillance and could be reactive, small right  pleural effusion, chronically stable small left adrenal mass which was not previously hypermetabolic on the PET CT scan and likely benign.  Pulmonology is following the patient for his abnormal CT scan and may perform a bronchoscopy in the near future.  The patient reports that he is feeling stronger.  He still has some weakness on his right side and some difficulty with slurred speech.  He is actively participating in therapy.  He reports a small amount of hemoptysis yesterday but none today.  He denies any other bleeding.  Denies headaches, chest discomfort, shortness of breath.  Denies abdominal pain, nausea, vomiting, change in his bowels.  The patient reports that he has had a recent PET scan at the [place] which he states was normal.  Patient lives at home with his wife. . He has 3 children. He has a history of smoking cigarettes for about 50 years.  He was smoked 1 pack a day.  Denies alcohol use.  Hematology was asked see the patient to make recommendations regarding anticoagulation.  Past Medical History:  Diagnosis Date   CAD (coronary artery disease)    Cancer ([place])    skin cancer face   COPD (chronic obstructive pulmonary disease) ([place])    Coronary artery disease    PCA 1994l; PCI august 2003 of an obtust marginal. Catheterization at the time reveal: left main normal; LAD 50-75% between first and second diagonals, 50% after lesion; left circumflex 95% in obtuse marg. RCA 100% proximal.   Diabetes mellitus    type 2   Hyperlipemia    Hypertension    Kidney disease    chronic; stage 2 followed By Dr Deterding   Myocardial infarction [facility])  Osteoarthritis    Peripheral vascular disease ([place])    Tobacco abuse   :    Past Surgical History:  Procedure Laterality Date   APPENDECTOMY     CHOLECYSTECTOMY     CORONARY ANGIOPLASTY     stent times 2, last cath 3 years ago   [place] N/A 06/09/2018   Procedure: possible, PLACEMENT OF FUDUCIAL;  Surgeon: Grace Isaac, MD;  Location: [place];  Service: Thoracic;  Laterality: N/A;   NERVE EXPLORATION  07/29/2012   Procedure: NERVE EXPLORATION;  Surgeon: Schuyler Amor, MD;  Location: [place];  Service: Orthopedics;  Laterality: Left;  Exploration /repair nerve and tendon left thumb   PERIPHERAL VASCULAR CATHETERIZATION N/A 04/26/2015   Procedure: Abdominal Aortogram;  Surgeon: Rosetta Posner, MD;  Location: [place] [facility];  Service: Cardiovascular;  Laterality: N/A;   VIDEO BRONCHOSCOPY N/A 06/09/2018   Procedure: VIDEO BRONCHOSCOPY;  Surgeon: Grace Isaac, MD;  Location: [place];  Service: Thoracic;  Laterality: N/A;   VIDEO BRONCHOSCOPY WITH ENDOBRONCHIAL NAVIGATION N/A 06/09/2018   Procedure: VIDEO BRONCHOSCOPY WITH ENDOBRONCHIAL NAVIGATION;  Surgeon: Grace Isaac, MD;  Location: [place] [facility];  Service: Thoracic;  Laterality: N/A;  :   CURRENT MEDS: Current Facility-Administered Medications  Medication Dose Route Frequency Provider Last Rate Last Dose   acetaminophen (TYLENOL) tablet 325-650 mg  325-650 mg Oral Q4H PRN Love, Pamela S, PA-C       allopurinol (ZYLOPRIM) tablet 100 mg  100 mg Oral QHS Love, Pamela S, PA-C   100 mg at 03/22/19 2116   alum & mag hydroxide-simeth (MAALOX/MYLANTA) 200-200-20 MG/5ML suspension 30 mL  30 mL Oral Q4H PRN Love, Pamela S, PA-C       amLODipine (NORVASC) tablet 10 mg  10 mg Oral Daily Love, Pamela S, PA-C   10 mg at 03/23/19 0758   apixaban (ELIQUIS) tablet 10 mg  10 mg Oral BID Reesa Chew S, PA-C   10 mg at 03/23/19 4854   Followed by   Derrill Memo ON 03/25/2019] apixaban (ELIQUIS) tablet 5 mg  5 mg Oral BID Love, Pamela S, PA-C       aspirin EC tablet 81 mg  81 mg Oral Daily Bary Leriche, PA-C   81 mg at 03/23/19 0759   benazepril (LOTENSIN) tablet 40 mg  40 mg Oral Q2200 Bary Leriche, PA-C   40 mg at 03/22/19 2115   benzonatate (TESSALON) capsule 100 mg  100 mg Oral TID PRN Reesa Chew S, PA-C       bisacodyl  (DULCOLAX) suppository 10 mg  10 mg Rectal Daily PRN Love, Pamela S, PA-C       calcitRIOL (ROCALTROL) capsule 0.25 mcg  0.25 mcg Oral Daily Love, Pamela S, PA-C   0.25 mcg at 03/23/19 0759   chlorpheniramine-HYDROcodone (TUSSIONEX) 10-8 MG/5ML suspension 5 mL  5 mL Oral Q12H PRN Donita Brooks, NP       dextromethorphan (DELSYM) 30 MG/5ML liquid 15 mg  15 mg Oral BID Alfredo Martinez, Brandi L, NP   15 mg at 03/23/19 0759   diphenhydrAMINE (BENADRYL) 12.5 MG/5ML elixir 12.5-25 mg  12.5-25 mg Oral Q6H PRN Love, Pamela S, PA-C       doxycycline (VIBRA-TABS) tablet 100 mg  100 mg Oral Q12H Love, Pamela S, PA-C   100 mg at 03/23/19 0759   insulin aspart (novoLOG) injection 0-15 Units  0-15 Units Subcutaneous TID WC Love, Ivan Anchors, PA-C   3 Units at  03/23/19 1247   insulin aspart (novoLOG) injection 0-5 Units  0-5 Units Subcutaneous QHS Bary Leriche, PA-C   2 Units at 03/20/19 2118   insulin glargine (LANTUS) injection 5 Units  5 Units Subcutaneous Daily Meredith Staggers, MD   5 Units at 03/23/19 0801   ipratropium-albuterol (DUONEB) 0.5-2.5 (3) MG/3ML nebulizer solution 3 mL  3 mL Nebulization TID Love, Pamela S, PA-C   3 mL at 03/23/19 0744   metoprolol tartrate (LOPRESSOR) tablet 100 mg  100 mg Oral BID Bary Leriche, PA-C   100 mg at 03/23/19 0759   mirtazapine (REMERON) tablet 15 mg  15 mg Oral QHS Love, Pamela S, PA-C   15 mg at 03/22/19 2115   polyethylene glycol (MIRALAX / GLYCOLAX) packet 17 g  17 g Oral Daily PRN Love, Pamela S, PA-C       prochlorperazine (COMPAZINE) tablet 5-10 mg  5-10 mg Oral Q6H PRN Love, Pamela S, PA-C       Or   prochlorperazine (COMPAZINE) injection 5-10 mg  5-10 mg Intramuscular Q6H PRN Love, Pamela S, PA-C       Or   prochlorperazine (COMPAZINE) suppository 12.5 mg  12.5 mg Rectal Q6H PRN Love, Pamela S, PA-C       sodium phosphate (FLEET) 7-19 GM/118ML enema 1 enema  1 enema Rectal Once PRN Love, Pamela S, PA-C       traZODone (DESYREL) tablet 25-50 mg   25-50 mg Oral QHS PRN Love, Pamela S, PA-C          Allergies  Allergen Reactions   Penicillins Other (See Comments)    UNSPECIFIED CHILDHOOD REACTION Has patient had PCN reaction causing immediate rash, facial/tongue/throat swelling, SOB or lightheadedness with hypotension: Unknown Has patient had a PCN reaction causing severe rash involving mucus membranes or skin necrosis: Unknown Has patient had a PCN reaction that required hospitalization: No Has patient had a PCN reaction occurring within the last 10 years: No If all of the above answers are "NO", then may proceed with Cephalosporin use.   :  Family History  Problem Relation Age of Onset   Coronary artery disease Mother    Heart disease Mother    Cancer Father    Stroke Other    Heart failure Other    Pulmonary embolism Other    Diabetes Brother    Hypertension Brother    Heart attack Brother    Peripheral vascular disease Brother    Heart disease Brother    Hypertension Sister   :  Social History   Socioeconomic History   Marital status: Married    Spouse name: Not on file   Number of children: 3   Years of education: Not on file   Highest education level: Not on file  Occupational History   Not on file  Social Needs   Financial resource strain: Not hard at all   Food insecurity:    Worry: Never true    Inability: Never true   Transportation needs:    Medical: Not on file    Non-medical: Not on file  Tobacco Use   Smoking status: Former Smoker    Packs/day: 0.50    Years: 50.00    Pack years: 25.00    Types: Cigarettes    Last attempt to quit: 05/09/2015    Years since quitting: 3.8   Smokeless tobacco: Former Systems developer    Types: Chew    Quit date: 2016  Substance and Sexual Activity  Alcohol use: No    Alcohol/week: 0.0 standard drinks   Drug use: No   Sexual activity: Not on file  Lifestyle   Physical activity:    Days per week: Not on file    Minutes per session:  Not on file   Stress: Not on file  Relationships   Social connections:    Talks on phone: Not on file    Gets together: Not on file    Attends religious service: Not on file    Active member of club or organization: Not on file    Attends meetings of clubs or organizations: Not on file    Relationship status: Not on file   Intimate partner violence:    Fear of current or ex partner: Not on file    Emotionally abused: Not on file    Physically abused: Not on file    Forced sexual activity: Not on file  Other Topics Concern   Not on file  Social History Narrative   Has a 40-pack-year smoking history and currently smokes 1/2 pack per day.    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  :  REVIEW OF SYSTEM:  The rest of the 14-point review of systems was negative.   Exam: Patient Vitals for the past 24 hrs:  BP Temp Temp src Pulse Resp SpO2 Weight  03/23/19 1504 123/64 98.6 F (37 C) Oral 74 19 94 % --  03/23/19 0746 -- -- -- -- -- 98 % --  03/23/19 0537 (!) 153/73 98.5 F (36.9 C) Oral 69 19 97 % --  03/23/19 0532 -- -- -- -- -- -- 205 lb 4 oz (93.1 kg)  03/22/19 2143 -- -- -- 80 20 92 % --  03/22/19 2001 (!) 144/61 98.8 F (37.1 C) Oral 75 18 91 % --    General:  well-nourished in no acute distress.  Eyes:  no scleral icterus.  ENT:  There were no oropharyngeal lesions.  Neck was without thyromegaly.  Lymphatics:  Negative cervical, supraclavicular or axillary adenopathy.  Respiratory: lungs were clear bilaterally without wheezing or crackles.  Cardiovascular:  Regular rate and rhythm, S1/S2, without murmur, rub or gallop.  There was no pedal edema.  GI:  abdomen was soft, flat, nontender, nondistended, without organomegaly.  Musculoskeletal:  no spinal tenderness of palpation of vertebral spine.  Skin exam was without ecchymosis, petechiae.  Neuro exam was nonfocal. Patient was alert and oriented.  Attention was good.   Language was appropriate.  Mood was normal  without depression.  Speech was slurred intermittently.  Thought content was not tangential.    LABS:  Lab Results  Component Value Date   WBC 8.0 03/22/2019   HGB 13.6 03/22/2019   HCT 45.0 03/22/2019   PLT 275 03/22/2019   GLUCOSE 159 (H) 03/22/2019   CHOL 90 03/16/2019   TRIG 95 03/16/2019   HDL 25 (L) 03/16/2019   LDLCALC 46 03/16/2019   ALT 13 03/22/2019   AST 15 03/22/2019   NA 141 03/22/2019   K 5.0 03/22/2019   CL 109 03/22/2019   CREATININE 1.30 (H) 03/22/2019   BUN 27 (H) 03/22/2019   CO2 25 03/22/2019   INR 1.5 (H) 03/15/2019   HGBA1C 8.1 (H) 03/16/2019    Ct Head Wo Contrast  Result Date: 03/15/2019 CLINICAL DATA:  68 year old male with acute dizziness and ataxia. EXAM: CT HEAD WITHOUT CONTRAST TECHNIQUE: Contiguous axial images were obtained from the base of the skull through the vertex without  intravenous contrast. COMPARISON:  None. FINDINGS: Brain: An infarct within the SUPERIOR RIGHT cerebellum appears acute or subacute. There is no evidence of hemorrhage. Mild atrophy and chronic small-vessel white matter ischemic changes are noted. No mass lesion, midline shift, hydrocephalus or extra-axial collection noted. Vascular: Carotid and vertebral atherosclerotic calcifications noted. Skull: Normal. Negative for fracture or focal lesion. Sinuses/Orbits: No acute finding. Other: None. IMPRESSION: 1. Acute to subacute RIGHT cerebellar infarct. No evidence of hemorrhage. 2. Atrophy and chronic small-vessel white matter ischemic changes. Electronically Signed   By: Margarette Canada M.D.   On: 03/15/2019 12:49   Ct Chest W Contrast  Result Date: 03/17/2019 CLINICAL DATA:  Squamous cell carcinoma the lungs. Right cerebellar stroke. EXAM: CT CHEST, ABDOMEN, AND PELVIS WITH CONTRAST TECHNIQUE: Multidetector CT imaging of the chest, abdomen and pelvis was performed following the standard protocol during bolus administration of intravenous contrast. CONTRAST:  7mL OMNIPAQUE IOHEXOL 300  MG/ML  SOLN COMPARISON:  Chest CT from 06/04/2018 and PET-CT from 08/15/2017 FINDINGS: CT CHEST FINDINGS Cardiovascular: Coronary, aortic arch, and branch vessel atherosclerotic vascular disease. Mild cardiomegaly. Interventricular septal thickening suggesting left ventricular hypertrophy. Mediastinum/Nodes: Right paratracheal node 1.0 cm in short axis on image 19/3, formerly 0.6 cm. Additional small right paratracheal lymph nodes are below 1 cm in diameter but mildly increased from prior exam. Subcarinal node 1.1 cm in short axis on image 30/3, formerly 0.5 cm. Bilateral gynecomastia. Lungs/Pleura: Small right pleural effusion. There is complete occlusion of the right upper lobe bronchus. There is atelectasis and possibly some mild alveolar filling airspace opacity posteriorly in the right upper lobe, with aerated anterior portion of the right upper lobe. Airway thickening along the bronchus intermedius resulting in some luminal narrowing, with airway thickening centrally in the right middle lobe and right lower lobe. Superior segment right lower lobe airspace opacity on image 67/5 could be from prior radiation therapy though infection is not excluded. Fiducials are noted in the superior segment right lower lobe. Further inferiorly in the vicinity of the prior pulmonary nodule, there is indistinct patchy opacity which could be from radiation pneumonitis or pneumonia, for example as shown on image 93/5. The original nodule is not readily apparent against this background density. There is some associated surrounding ground-glass opacity. There is some faint nodularity peripherally in the left upper lobe. On image 62/5, a triangular 0.4 by 0.3 cm nodule is new or significantly increased from prior. Indistinct ground-glass density nodule in the left upper lobe measuring 0.9 cm in diameter on image 78/5 is new. Subsegmental atelectasis is present anteriorly in left lower lobe. Small fiducials are present anteriorly in  the left lower lobe and there are bandlike opacities along the left hemidiaphragm with crowding of bronchovascular structures suggesting some degree of volume loss. The lobulated nodule along the inferior pulmonary ligament is somewhat obscured by surrounding atelectasis, for example on image 114/5, but is less sharply defined and possibly less prominent. There is some additional nodularity in this vicinity, for example posteriorly on image 112/5 where there is a 1.7 by 1.3 cm ill-defined nodular density, probably inflammatory. Musculoskeletal: Thoracic spondylosis. CT ABDOMEN PELVIS FINDINGS Hepatobiliary: Cholecystectomy. The common bile duct measures 1.1 cm in diameter, not changed from 08/15/2017, probably a physiologic response to cholecystectomy. No focal liver lesion is identified. Pancreas: Unremarkable Spleen: Unremarkable Adrenals/Urinary Tract: 1.7 by 1.8 cm left adrenal mass is unchanged in size from 08/15/2017 and was not previously hypermetabolic, hence likely benign. Small hypodense lesions of the left mid kidney right kidney  upper pole probably cysts but technically too small to characterize. Considerable vascular calcifications along the renal hila bilaterally. No definite urinary tract calculi. Stomach/Bowel: Unremarkable Vascular/Lymphatic: Aortoiliac atherosclerotic vascular disease. No pathologic adenopathy identified. Reproductive: Faint calcification in the right posterior prostate gland. Other: No supplemental non-categorized findings. Musculoskeletal: Incidental lipoma in the right vastus lateralis muscle. Degenerative disc disease at L4-5. IMPRESSION: 1. Both the right lower lobe pulmonary nodule on the left lower lobe pulmonary nodule are reduced in conspicuity with some degree of surrounding airspace opacity, possibly radiation pneumonitis if the patient has had interval radiation therapy. 2. Complete occlusion of the right upper lobe bronchus. Most of the right upper lobe is collapsed  potentially with a small amount of airspace filling process; there is air trapping in portions of the right upper lobe anteriorly. 3. Airway thickening centrally in the right lung, resulting in some luminal narrowing of the bronchus intermedius. 4. There is a new 4 mm triangular-shaped left upper lobe pulmonary nodule along with a new small ground-glass density nodule in the left upper lobe which merit surveillance. 5. Mildly enlarged paratracheal and subcarinal lymph nodes likewise merits surveillance although could be reactive. 6. Other imaging findings of potential clinical significance: Aortic Atherosclerosis (ICD10-I70.0). Coronary atherosclerosis. Mild cardiomegaly with suggestion of left ventricular hypertrophy. Small right pleural effusion. Chronically stable small left adrenal mass, not previously hypermetabolic on PET-CT and hence likely benign. Electronically Signed   By: Van Clines M.D.   On: 03/17/2019 15:11   Mr Jodene Nam Head Wo Contrast  Result Date: 03/15/2019 CLINICAL DATA:  Dizziness.  Abnormal cord in a shin. EXAM: MRI HEAD WITHOUT CONTRAST MRA HEAD WITHOUT CONTRAST TECHNIQUE: Multiplanar, multiecho pulse sequences of the brain and surrounding structures were obtained without intravenous contrast. Angiographic images of the head were obtained using MRA technique without contrast. COMPARISON:  Head CT same day FINDINGS: MRI HEAD FINDINGS Brain: Diffusion imaging confirms the presence of a 3 cm region of acute infarction in the right superior and lateral cerebellum. Mild swelling but no hemorrhage or mass effect. No other acute infarction. Elsewhere, there chronic small-vessel ischemic changes of the pons. There are moderate chronic small-vessel ischemic changes of the cerebral hemispheric white matter. No large vessel territory infarction. No mass lesion, hemorrhage, hydrocephalus or extra-axial collection. Vascular: Major vessels at the base of the brain show flow. Skull and upper cervical  spine: Negative Sinuses/Orbits: Clear/normal Other: None MRA HEAD FINDINGS Both internal carotid arteries are widely patent through the skull base and siphon regions. The anterior and middle cerebral vessels are patent without proximal stenosis, aneurysm or vascular malformation. Both vertebral arteries are patent at the foramen magnum and to the basilar. No basilar stenosis. Posterior inferior cerebellar arteries show flow on each side. Right anterior inferior cerebellar artery is seen. Both superior cerebellar arteries show flow presently. Both posterior cerebral arteries show flow. IMPRESSION: 3 cm region of acute infarction in the right superolateral cerebellum. No hemorrhage or mass effect. Moderate chronic small-vessel ischemic changes of the pons and cerebral hemispheric white matter. Intracranial MR angiography negative for large or medium vessel occlusion. Specifically, no proximal posterior circulation occlusion seen to explain the infarction. Electronically Signed   By: Nelson Chimes M.D.   On: 03/15/2019 14:56   Mr Brain Wo Contrast  Result Date: 03/15/2019 CLINICAL DATA:  Dizziness.  Abnormal cord in a shin. EXAM: MRI HEAD WITHOUT CONTRAST MRA HEAD WITHOUT CONTRAST TECHNIQUE: Multiplanar, multiecho pulse sequences of the brain and surrounding structures were obtained without intravenous contrast. Angiographic  images of the head were obtained using MRA technique without contrast. COMPARISON:  Head CT same day FINDINGS: MRI HEAD FINDINGS Brain: Diffusion imaging confirms the presence of a 3 cm region of acute infarction in the right superior and lateral cerebellum. Mild swelling but no hemorrhage or mass effect. No other acute infarction. Elsewhere, there chronic small-vessel ischemic changes of the pons. There are moderate chronic small-vessel ischemic changes of the cerebral hemispheric white matter. No large vessel territory infarction. No mass lesion, hemorrhage, hydrocephalus or extra-axial  collection. Vascular: Major vessels at the base of the brain show flow. Skull and upper cervical spine: Negative Sinuses/Orbits: Clear/normal Other: None MRA HEAD FINDINGS Both internal carotid arteries are widely patent through the skull base and siphon regions. The anterior and middle cerebral vessels are patent without proximal stenosis, aneurysm or vascular malformation. Both vertebral arteries are patent at the foramen magnum and to the basilar. No basilar stenosis. Posterior inferior cerebellar arteries show flow on each side. Right anterior inferior cerebellar artery is seen. Both superior cerebellar arteries show flow presently. Both posterior cerebral arteries show flow. IMPRESSION: 3 cm region of acute infarction in the right superolateral cerebellum. No hemorrhage or mass effect. Moderate chronic small-vessel ischemic changes of the pons and cerebral hemispheric white matter. Intracranial MR angiography negative for large or medium vessel occlusion. Specifically, no proximal posterior circulation occlusion seen to explain the infarction. Electronically Signed   By: Nelson Chimes M.D.   On: 03/15/2019 14:56   Ct Abdomen Pelvis W Contrast  Result Date: 03/17/2019 CLINICAL DATA:  Squamous cell carcinoma the lungs. Right cerebellar stroke. EXAM: CT CHEST, ABDOMEN, AND PELVIS WITH CONTRAST TECHNIQUE: Multidetector CT imaging of the chest, abdomen and pelvis was performed following the standard protocol during bolus administration of intravenous contrast. CONTRAST:  43mL OMNIPAQUE IOHEXOL 300 MG/ML  SOLN COMPARISON:  Chest CT from 06/04/2018 and PET-CT from 08/15/2017 FINDINGS: CT CHEST FINDINGS Cardiovascular: Coronary, aortic arch, and branch vessel atherosclerotic vascular disease. Mild cardiomegaly. Interventricular septal thickening suggesting left ventricular hypertrophy. Mediastinum/Nodes: Right paratracheal node 1.0 cm in short axis on image 19/3, formerly 0.6 cm. Additional small right paratracheal  lymph nodes are below 1 cm in diameter but mildly increased from prior exam. Subcarinal node 1.1 cm in short axis on image 30/3, formerly 0.5 cm. Bilateral gynecomastia. Lungs/Pleura: Small right pleural effusion. There is complete occlusion of the right upper lobe bronchus. There is atelectasis and possibly some mild alveolar filling airspace opacity posteriorly in the right upper lobe, with aerated anterior portion of the right upper lobe. Airway thickening along the bronchus intermedius resulting in some luminal narrowing, with airway thickening centrally in the right middle lobe and right lower lobe. Superior segment right lower lobe airspace opacity on image 67/5 could be from prior radiation therapy though infection is not excluded. Fiducials are noted in the superior segment right lower lobe. Further inferiorly in the vicinity of the prior pulmonary nodule, there is indistinct patchy opacity which could be from radiation pneumonitis or pneumonia, for example as shown on image 93/5. The original nodule is not readily apparent against this background density. There is some associated surrounding ground-glass opacity. There is some faint nodularity peripherally in the left upper lobe. On image 62/5, a triangular 0.4 by 0.3 cm nodule is new or significantly increased from prior. Indistinct ground-glass density nodule in the left upper lobe measuring 0.9 cm in diameter on image 78/5 is new. Subsegmental atelectasis is present anteriorly in left lower lobe. Small fiducials are present  anteriorly in the left lower lobe and there are bandlike opacities along the left hemidiaphragm with crowding of bronchovascular structures suggesting some degree of volume loss. The lobulated nodule along the inferior pulmonary ligament is somewhat obscured by surrounding atelectasis, for example on image 114/5, but is less sharply defined and possibly less prominent. There is some additional nodularity in this vicinity, for example  posteriorly on image 112/5 where there is a 1.7 by 1.3 cm ill-defined nodular density, probably inflammatory. Musculoskeletal: Thoracic spondylosis. CT ABDOMEN PELVIS FINDINGS Hepatobiliary: Cholecystectomy. The common bile duct measures 1.1 cm in diameter, not changed from 08/15/2017, probably a physiologic response to cholecystectomy. No focal liver lesion is identified. Pancreas: Unremarkable Spleen: Unremarkable Adrenals/Urinary Tract: 1.7 by 1.8 cm left adrenal mass is unchanged in size from 08/15/2017 and was not previously hypermetabolic, hence likely benign. Small hypodense lesions of the left mid kidney right kidney upper pole probably cysts but technically too small to characterize. Considerable vascular calcifications along the renal hila bilaterally. No definite urinary tract calculi. Stomach/Bowel: Unremarkable Vascular/Lymphatic: Aortoiliac atherosclerotic vascular disease. No pathologic adenopathy identified. Reproductive: Faint calcification in the right posterior prostate gland. Other: No supplemental non-categorized findings. Musculoskeletal: Incidental lipoma in the right vastus lateralis muscle. Degenerative disc disease at L4-5. IMPRESSION: 1. Both the right lower lobe pulmonary nodule on the left lower lobe pulmonary nodule are reduced in conspicuity with some degree of surrounding airspace opacity, possibly radiation pneumonitis if the patient has had interval radiation therapy. 2. Complete occlusion of the right upper lobe bronchus. Most of the right upper lobe is collapsed potentially with a small amount of airspace filling process; there is air trapping in portions of the right upper lobe anteriorly. 3. Airway thickening centrally in the right lung, resulting in some luminal narrowing of the bronchus intermedius. 4. There is a new 4 mm triangular-shaped left upper lobe pulmonary nodule along with a new small ground-glass density nodule in the left upper lobe which merit surveillance. 5.  Mildly enlarged paratracheal and subcarinal lymph nodes likewise merits surveillance although could be reactive. 6. Other imaging findings of potential clinical significance: Aortic Atherosclerosis (ICD10-I70.0). Coronary atherosclerosis. Mild cardiomegaly with suggestion of left ventricular hypertrophy. Small right pleural effusion. Chronically stable small left adrenal mass, not previously hypermetabolic on PET-CT and hence likely benign. Electronically Signed   By: Van Clines M.D.   On: 03/17/2019 15:11   Dg Chest Port 1 View  Result Date: 03/15/2019 CLINICAL DATA:  Cough.  History of lung cancer. EXAM: PORTABLE CHEST 1 VIEW COMPARISON:  10/23/2018 and CT 06/04/2018 FINDINGS: Lungs are hypoinflated with worsening masslike opacification over the right suprahilar/paramediastinal region with mild mixed interstitial airspace density over the more peripheral right upper lobe/apex. No evidence of effusion. Cardiomediastinal silhouette and remainder of the exam is unchanged. IMPRESSION: Mild interval worsening masslike opacity over the right suprahilar/paramediastinal region likely patient's known right lung cancer. Minimal mixed interstitial airspace density more peripherally over the right upper lobe/apex. Electronically Signed   By: Marin Olp M.D.   On: 03/15/2019 11:22   Vas [place] Transcranial Doppler W Bubbles  Result Date: 03/22/2019  Transcranial Doppler with Bubble Indications: Stroke. Performing Technologist: Abram Sander RVS  Examination Guidelines: A complete evaluation includes B-mode imaging, spectral Doppler, color Doppler, and power Doppler as needed of all accessible portions of each vessel. Bilateral testing is considered an integral part of a complete examination. Limited examinations for reoccurring indications may be performed as noted.  Summary:  A vascular evaluation was performed. The left  middle cerebral artery was studied. An IV was inserted into the patient's right forearm.  Verbal informed consent was obtained.  Interpretation: HITS heard at rest: None HITS heard during valsalva: None Negative TCD Bubble study *See table(s) above for measurements and observations.  Diagnosing physician: Antony Contras MD Electronically signed by Antony Contras MD on 03/22/2019 at 2:02:31 PM.    Final    Vas [place] Lower Extremity Venous (dvt)  Result Date: 03/18/2019  Lower Venous Study Indications: Stroke.  Performing Technologist: Maudry Mayhew MHA, RDMS, RVT, RDCS  Examination Guidelines: A complete evaluation includes B-mode imaging, spectral Doppler, color Doppler, and power Doppler as needed of all accessible portions of each vessel. Bilateral testing is considered an integral part of a complete examination. Limited examinations for reoccurring indications may be performed as noted.  +---------+---------------+---------+-----------+----------+-------+  RIGHT     Compressibility Phasicity Spontaneity Properties Summary  +---------+---------------+---------+-----------+----------+-------+  CFV       Full            Yes       Yes                             +---------+---------------+---------+-----------+----------+-------+  SFJ       Full                                                      +---------+---------------+---------+-----------+----------+-------+  FV Prox   Full                                                      +---------+---------------+---------+-----------+----------+-------+  FV Mid    Full                                                      +---------+---------------+---------+-----------+----------+-------+  FV Distal Full                                                      +---------+---------------+---------+-----------+----------+-------+  PFV       Full                                                      +---------+---------------+---------+-----------+----------+-------+  POP       Full            Yes       Yes                              +---------+---------------+---------+-----------+----------+-------+  PTV       Full                                                      +---------+---------------+---------+-----------+----------+-------+  PERO      Full                                                      +---------+---------------+---------+-----------+----------+-------+   +---------+---------------+---------+-----------+----------+-------+  LEFT      Compressibility Phasicity Spontaneity Properties Summary  +---------+---------------+---------+-----------+----------+-------+  CFV       Full            Yes       Yes                             +---------+---------------+---------+-----------+----------+-------+  SFJ       Full                                                      +---------+---------------+---------+-----------+----------+-------+  FV Prox   Full                                                      +---------+---------------+---------+-----------+----------+-------+  FV Mid    Full                                                      +---------+---------------+---------+-----------+----------+-------+  FV Distal Full                                                      +---------+---------------+---------+-----------+----------+-------+  PFV       Full                                                      +---------+---------------+---------+-----------+----------+-------+  POP       Full            Yes       Yes                             +---------+---------------+---------+-----------+----------+-------+  PTV       None                      No                     Acute    +---------+---------------+---------+-----------+----------+-------+  PERO      Full                                                      +---------+---------------+---------+-----------+----------+-------+  Summary: Right: There is no evidence of deep vein thrombosis in the lower extremity. No cystic structure found in the popliteal fossa. Left: Findings  consistent with acute deep vein thrombosis involving the left posterior tibial vein. No cystic structure found in the popliteal fossa.  *See table(s) above for measurements and observations. Electronically signed by Servando Snare MD on 03/18/2019 at 4:34:38 PM.    Final     ASSESSMENT AND PLAN:  This is a pleasant 68 year old Caucasian male who has a history of stage Ia non-small cell lung cancer, squamous cell and received radiation which was completed on 07/15/2018.  In the interval, the patient has developed a pulmonary embolism and was placed on Eliquis.  More recently, he had a cerebellar stroke while taking Eliquis and also developed a left lower extremity DVT.  The patient remains on Eliquis and aspirin per the recommendation of neurology.  There is a concern about coagulability and the fact that the patient developed a cerebral infarct and DVT while already on Eliquis and whether he should be changed to different anticoagulation.  Additionally, the patient has right upper lobe of the lung collapse and hemoptysis in the setting of history of stage 1A squamous cell carcinoma of the lung.  We will discuss with Dr. Julien Nordmann need for change of anticoagulation possibly to Lovenox.  Agree with work-up by pulmonology for right upper lobe lung collapse.  He may need a bronchoscopy in the near future.  Thank you for this referral.  Mikey Bussing, DNP, AGPCNP-BC, AOCNP  ADDENDUM: Hematology/Oncology Attending: The patient is seen and examined today.  I agree with the above note.  This is a very pleasant 68 years old white male with history of a stage Ia non-small cell lung cancer, squamous cell carcinoma status post curative radiotherapy under the care of Dr. Tammi Klippel in September 2019.  The patient mentions that in December 2019 he presented to the hospital with shortness of breath and VQ scan at that time was consistent with pulmonary embolism.  He had Doppler of the lower extremities at that time that were  unremarkable for any deep venous thrombosis.  The patient was a started on treatment with Eliquis 5 mg p.o. twice daily since that time and has been very compliant with his medication according to the patient.  He was recently admitted to the hospital with cerebellar stroke and during his hospitalization he has swelling of the left lower extremity and Doppler of the lower extremities was consistent with deep venous thrombosis of the left lower extremity.  Neurology recommended for the patient to continue on Eliquis and aspirin for his stroke. We are consulted for evaluating the patient for his current hypercoagulable status and development of deep venous thrombosis while on treatment with Eliquis. The patient is feeling fine today with no concerning complaints and he is undergoing rehabilitation. He does not have any family history of hypercoagulable conditions. I had a lengthy discussion with the patient today about his current condition and treatment options. I gave the patient the option of switching his anticoagulation to subcutaneous Lovenox but the patient is not in favor of this option.  He likes to continue on Eliquis.  The other option is to consider the patient for IVC filter and keep him on Eliquis with the same dose.  He may also need hypercoagulable panel to rule out any underlying genetic or acquired abnormalities.  Based on his current wishes, I would recommend for the patient to continue his current treatment with Eliquis and  consider him for the IVC filter placement to prevent the propagation of any future DVT to his lung. Thank you so much for the consultation, please call if you have any questions.  Disclaimer: This note was dictated with voice recognition software. Similar sounding words can inadvertently be transcribed and may be missed upon review. Eilleen Kempf, MD

## 2019-03-23 NOTE — Progress Notes (Signed)
Physical Therapy Session Note  Patient Details  Name: NGAI PARCELL MRN: [id number] Date of Birth: 1951/02/27  Today's Date: 03/23/2019 PT Individual Time: 4650-3546 PT Individual Time Calculation (min): 59 min   Short Term Goals: Week 1:  PT Short Term Goal 1 (Week 1): STG=LTG due to ELOS  Skilled Therapeutic Interventions/Progress Updates:      Therapy Documentation Precautions:  Precautions Precautions: Fall Precaution Comments: Ataxic, LEs>UEs Restrictions Weight Bearing Restrictions: No General:   Vital Signs: Therapy Vitals Temp: 98.6 F (37 C) Temp Source: Oral Pulse Rate: 74 Resp: 19 BP: 123/64 Patient Position (if appropriate): Sitting Oxygen Therapy SpO2: 94 % O2 Device: Room Air  Treatment: Pt received sitting edge of bed. No new complaints, denies pain. Agreeable to PT at this time.   Gait: 345 feet with RW from room to rehab gym near central elevators. Cues for posture, to slow down for improved step placement and to incr base of support for improved gait mechanics. Min guard to min assist for balance with gait. Improved foot clearance with use of shoes today vs PT session yesterday.   Neuro Re-ed: for right LE strengthening, improved coordination and improved weight shifting. In tall kneeling on mat table: without UE support- worked on achieving tall posture. Once achieved had pt work on maintaining this position against gentle pertubation's in various directions. Then had pt work on alternating UE raies with emphasis on staying tall. Lastly had pt work on mini squats with emphasis on equal LE weight bearing and full return to tall kneeling. With UE support on chair seat- had pt move LE bwd/fwd on right, then on left side. Had pt move LE out/in on right, then on left side. Cues on staying tall with movements and emphasis on weight shifting to allow for the movements. Rest breaks needed between ex's due to fatigue. Min guard to min assist for balance and weight  shifting.   Seated edge of mat table with bil feet across red foam beam, chair in front of pt for safety. Sit<>stands 2 sets of 5 reps with emphasis on full standing with stability and slow, controlled descent to mat table. Use of UE's needed with min guard to min assist. Touch to chair for balance in standing with first few reps only.   Standing on floor next to mat table with color circles (4) on floor in semi circle pattern, UE support on chair back on right side, PTA on left side. Toe taps to each circle and back x 2 laps each way with each LE. Cues for increased quad activation with right stance for stability and cues for improved target hitting with left stance. Min assist for balance.  At counter with UE support: lateral stepping with same side reaching up/out, alternating sides x 5 reps each side. Cues for step length and weight shifting. Alternating steppign bwd with same side bwd reaching x 5 reps with emphasis on step length/height and weight shifting. Min guard to min assist for balance.   Had pt self propel wheelchair with legs only for ~50 feet at end of session for LE strengthening.   Pt left in room with all needs in reach, belt alarm on and NP in room. VSS with SaO2 >/=99% with session (spot checked with activity).     Therapy/Group: Individual Therapy  Lindon Romp, PTA, CLT 03/23/19, 3:56 PM 03/23/2019, 3:56 PM

## 2019-03-24 ENCOUNTER — Inpatient Hospital Stay (HOSPITAL_COMMUNITY): Payer: Medicare Other | Admitting: Physical Therapy

## 2019-03-24 ENCOUNTER — Inpatient Hospital Stay (HOSPITAL_COMMUNITY): Payer: Medicare Other | Admitting: Speech Pathology

## 2019-03-24 ENCOUNTER — Inpatient Hospital Stay (HOSPITAL_COMMUNITY): Payer: Medicare Other | Admitting: Occupational Therapy

## 2019-03-24 DIAGNOSIS — J9811 Atelectasis: Secondary | ICD-10-CM

## 2019-03-24 LAB — CARDIOLIPIN ANTIBODIES, IGG, IGM, IGA
Anticardiolipin IgA: <9 APL U/mL (ref 0–11)
Anticardiolipin IgG: <9 GPL U/mL (ref 0–14)
Anticardiolipin IgM: <9 MPL U/mL (ref 0–12)

## 2019-03-24 LAB — GLUCOSE, CAPILLARY
Glucose-Capillary: 152 mg/dL — ABNORMAL HIGH (ref 70–99)
Glucose-Capillary: 145 mg/dL — ABNORMAL HIGH (ref 70–99)
Glucose-Capillary: 251 mg/dL — ABNORMAL HIGH (ref 70–99)
Glucose-Capillary: 159 mg/dL — ABNORMAL HIGH (ref 70–99)

## 2019-03-24 LAB — BETA-2-GLYCOPROTEIN I ABS, IGG/M/A
Beta-2 Glyco I IgG: <9 GPI IgG units (ref 0–20)
Beta-2-Glycoprotein I IgA: <9 GPI IgA units (ref 0–25)
Beta-2-Glycoprotein I IgM: <9 GPI IgM units (ref 0–32)

## 2019-03-24 LAB — PROTEIN C, TOTAL: Protein C, Total: 107 % (ref 60–150)

## 2019-03-24 LAB — HOMOCYSTEINE: Homocysteine: 17.2 umol/L (ref 0.0–17.2)

## 2019-03-24 NOTE — Patient Care Conference (Signed)
Inpatient RehabilitationTeam Conference and Plan of Care Update Date: 03/24/2019   Time: 11:00 AM    Patient Name: Travis [name]      Medical Record Number: [id number]  Date of Birth: [birth year] Sex: Male         Room/Bed: 4W23C/4W23C-01 Payor Info: Payor: MEDICARE / Plan: MEDICARE PART A AND B / Product Type: *No Product type* /    Admitting Diagnosis: CVA  Admit Date/Time:  03/19/2019  3:49 PM Admission Comments: No comment available   Primary Diagnosis:  <principal problem not specified> Principal Problem: <principal problem not specified>  Patient Active Problem List   Diagnosis Date Noted  . Cerebellar stroke ([place]) 03/19/2019  . CVA (cerebral vascular accident) ([place]) 03/15/2019  . Nausea & vomiting 03/15/2019  . Squamous cell carcinoma of lungs, bilateral ([place]) 06/11/2018  . Flexor tendon laceration, finger, open wound 07/29/2012  . PVD 12/08/2009  . DIAB W/RENAL MANIFESTS TYPE I [JUV] NOT UNCNTRL 09/08/2009  . HYPERCHOLESTEROLEMIA 09/08/2009  . TOBACCO ABUSE 09/08/2009  . Essential hypertension, benign 09/08/2009  . CORONARY ATHEROSCLEROSIS NATIVE CORONARY ARTERY 09/08/2009  . CKD (chronic kidney disease) stage 3, GFR 30-59 ml/min ([place]) 09/08/2009    Expected Discharge Date: Expected Discharge Date: 03/27/19  Team Members Present: Physician leading conference: Dr. Alysia Penna Social Worker Present: Ovidio Kin, LCSW Nurse Present: Ellison Carwin, LPN PT Present: Barrie Folk, PT;Rosita Dechalus, PTA OT Present: Darleen Crocker, OT SLP Present: Stormy Fabian, SLP PPS Coordinator present : Gunnar Fusi     Current Status/Progress Goal Weekly Team Focus  Medical   Weaning O2 , BPs a little labile, pulmonary on consult  Off O2 by d/c, ? change heme manaement of hypercoagulability  d/c planning   Bowel/Bladder   continent x2 LBm 5/12  remain continent  assess bowel and bladder needs qshift   Swallow/Nutrition/ Hydration             ADL's   Steady assist-close  supervision for BADLs at shower level + functional transfers using RW  Mod I   Dynamic balance, d/c planning, Rt NMR, general strengthening and endurance, 02 weaning     Mobility   close S bed mobilty, CGA with intermittent close S transfers, CGA gait >363ft with RW narrow BOS, maintaining SpO2>95% on 1L supplemental O2 with activity  Supervision assist   d/c planning, gait, endurance, higher level balance   Communication   Min A to supervision for speech intelligibility at sentence and conversation level  supervision for conversation  use of speech intelligibility strategies   Safety/Cognition/ Behavioral Observations            Pain   denies pain  remain free of pain  assess pain qshift and PRN   Skin   no skin impairments  remain free of skin impairments  assess skin qshift and PRN      *See Care Plan and progress notes for long and short-term goals.     Barriers to Discharge  Current Status/Progress Possible Resolutions Date Resolved   Physician    Medical stability;New oxygen     progressing very well toward goals  Upgrade goals to Mod I      Nursing                  PT  Decreased caregiver support;Medical stability                 OT Medical stability;New oxygen  SLP                SW                Discharge Planning/Teaching Needs:  HOme with wife who is able to assist and will ned family education prior to DC home. Pt hopeful he can be weaned off O2 prior to DC      Team Discussion:  Doing well in therapies,currently supervision. Will upgrade to mod/i level. Weaning off O2 will not need at DC. Medical issues-US and pulmonology involved regarding best plan for blood thinning at DC. Team feels can call and discuss education with wife via telephone  Revisions to Treatment Plan:  DC 5/16    Continued Need for Acute Rehabilitation Level of Care: The patient requires daily medical management by a physician with specialized training in physical medicine  and rehabilitation for the following conditions: Daily direction of a multidisciplinary physical rehabilitation program to ensure safe treatment while eliciting the highest outcome that is of practical value to the patient.: Yes Daily medical management of patient stability for increased activity during participation in an intensive rehabilitation regime.: Yes Daily analysis of laboratory values and/or radiology reports with any subsequent need for medication adjustment of medical intervention for : Neurological problems;Pulmonary problems   I attest that I was present, lead the team conference, and concur with the assessment and plan of the team. Teleconference held due to Jennings, Sobieski 03/24/2019, 11:17 AM

## 2019-03-24 NOTE — Progress Notes (Signed)
Social Work Patient ID: Travis [name], male   DOB: 1951-08-16, 68 y.o.   MRN: [id number] Met with pt and spoke with wife and daughter in-law to inform team conference goals supervision-mod/i level and target discharge 5/16. Wife is interested in talking with the MD's regarding his medical issues. All pleased with his progress and going home on Sat. Work toward discharge Sat.

## 2019-03-24 NOTE — Progress Notes (Signed)
Physical Therapy Session Note  Patient Details  Name: Travis [name] MRN: [id number] Date of Birth: 1951/01/10  Today's Date: 03/24/2019 PT Individual Time: 1400-1500 PT Individual Time Calculation (min): 60 min   Short Term Goals: Week 1:  PT Short Term Goal 1 (Week 1): STG=LTG due to ELOS  Skilled Therapeutic Interventions/Progress Updates:   Pt received sitting EOB and agreeable to PT. PT instructed pt in gait training to rehab gym with RW and close supervision assist x 192f. Min assist x1 to prevent R lateral LOB in turn.   PT instructed pt in dynamic balance training with wii sports bowling x 15 minutes with supervision assist overall. Wii fit balnace board [place] slide x 2 and table tilt x 2 with min assist and moderate cues for increased ankle strategy. UE support on RW for first bout of each activity no UE support on each additional. Supervision assist for all sit<>stand through balance strategies.   Gait training through hall with continuous Pulse oximetry throughout x [id number] supervision assist. SpO2 >91% throughout on room air. Cues for increased step width to improve BOS. Patient returned to room and left sitting EOB with call bell in reach and all needs met.         Therapy Documentation Precautions:  Precautions Precautions: Fall Precaution Comments: Ataxic, LEs>UEs Restrictions Weight Bearing Restrictions: No Vital Signs: Therapy Vitals Temp: 98.3 F (36.8 C) Temp Source: Oral Pulse Rate: 69 Resp: 18 BP: (!) 118/94 Patient Position (if appropriate): Sitting Oxygen Therapy SpO2: 97 % O2 Device: Room Air Pain: Pain Assessment Pain Scale: 0-10 Pain Score: 0-No pain    Therapy/Group: Individual Therapy  AuLorie Phenix/13/2020, 3:07 PM

## 2019-03-24 NOTE — Progress Notes (Signed)
[place] PHYSICAL MEDICINE & REHABILITATION PROGRESS NOTE   Subjective/Complaints: Appreciate hematology note, pt declines lovenox, IVC another option to prevent PE (but not DVT) Appreciate pulm note, had a CT chest at [facility] and one ordered by Dr Joylene Draft  ROS: Patient denies , nausea, vomiting, diarrhea,   shortness of breath or chest pain, joint or back pain,  Objective:   No results found. Recent Labs    03/22/19 0702  WBC 8.0  HGB 13.6  HCT 45.0  PLT 275   Recent Labs    03/22/19 0702  NA 141  K 5.0  CL 109  CO2 25  GLUCOSE 159*  BUN 27*  CREATININE 1.30*  CALCIUM 9.1    Intake/Output Summary (Last 24 hours) at 03/24/2019 0918 Last data filed at 03/24/2019 9562 Gross per 24 hour  Intake 462 ml  Output 1050 ml  Net -588 ml     Physical Exam: Vital Signs Blood pressure 121/74, pulse 77, temperature 98.4 F (36.9 C), temperature source Oral, resp. rate 18, weight 93.1 kg, SpO2 91 %. Constitutional: No distress . Vital signs reviewed. HEENT: EOMI, oral membranes moist Neck: supple Cardiovascular: RRR without murmur. No JVD    Respiratory: occasional exp wheeze, intermittent productive cough  GI: BS +, non-tender, non-distended   Neurological: He isalertand oriented to person, place, and time. He displaysnormal reflexes. Nocranial nerve deficit. He exhibitsnormal muscle tone.Coordinationabnormal.  Mild right facial weakness with /mild dysarthria .  Good insight and awareness.  Patient frustrated by dysarthria. Ataxia RUE>RLE with FTN and HTS. Strength 4+/5 RUE and RLE and 5/5 LUE and LLE. No sensory findings. Right upper extremity dysmetria improved to a mild/moderate level Skin: Skin iswarmand dry. He isnot diaphoretic.  Psychiatric: pleasant.     Assessment/Plan: 1. Functional deficits secondary to right cerebellar infarct which require 3+ hours per day of interdisciplinary therapy in a comprehensive inpatient rehab setting.  Physiatrist is  providing close team supervision and 24 hour management of active medical problems listed below.  Physiatrist and rehab team continue to assess barriers to discharge/monitor patient progress toward functional and medical goals  Care Tool:  Bathing    Body parts bathed by patient: Right arm, Left arm, Chest, Abdomen, Front perineal area, Buttocks, Right upper leg, Left upper leg, Right lower leg, Left lower leg, Face         Bathing assist Assist Level: Contact Guard/Touching assist     Upper Body Dressing/Undressing Upper body dressing   What is the patient wearing?: Pull over shirt    Upper body assist Assist Level: Supervision/Verbal cueing    Lower Body Dressing/Undressing Lower body dressing            Lower body assist Assist for lower body dressing: Contact Guard/Touching assist     Toileting Toileting Toileting Activity did not occur (Clothing management and hygiene only): N/A (no void or bm)  Toileting assist Assist for toileting: Contact Guard/Touching assist Assistive Device Comment: urinal    Transfers Chair/bed transfer  Transfers assist     Chair/bed transfer assist level: Moderate Assistance - Patient 50 - 74%     Locomotion Ambulation   Ambulation assist      Assist level: Minimal Assistance - Patient > 75% Assistive device: Walker-rolling Max distance: 345   Walk 10 feet activity   Assist     Assist level: Minimal Assistance - Patient > 75% Assistive device: Walker-rolling   Walk 50 feet activity   Assist    Assist level: Minimal Assistance -  Patient > 75% Assistive device: Walker-rolling    Walk 150 feet activity   Assist Walk 150 feet activity did not occur: Safety/medical concerns  Assist level: Minimal Assistance - Patient > 75% Assistive device: Walker-rolling    Walk 10 feet on uneven surface  activity   Assist Walk 10 feet on uneven surfaces activity did not occur: Safety/medical concerns          Wheelchair     Assist   Type of Wheelchair: Manual    Wheelchair assist level: Supervision/Verbal cueing Max wheelchair distance: 131f    Wheelchair 50 feet with 2 turns activity    Assist        Assist Level: Supervision/Verbal cueing   Wheelchair 150 feet activity     Assist     Assist Level: Supervision/Verbal cueing    Medical Problem List and Plan: 1.Functional and mobility deficitssecondary to right cerebellar infarct -continue CIR therapies today including PT, OT, and SLP  Team conference today please see physician documentation under team conference tab, met with team face-to-face to discuss problems,progress, and goals. Formulized individual treatment plan based on medical history, underlying problem and comorbidities. 2. Antithrombotics: -Acute leftDVT/PE dx-10/2018/anticoagulation:Pharmaceutical:Other (comment)--Eliquis treatment dose X 2 weeks followed by maintaince dose. pt declines chronic enoxaparin although he states "I'll do it if I have to" , discussed potential IVC filter, repeat doppler LE to see if there are any new DVTs since last doppler 1 wk ago -antiplatelet therapy: ASA 3. Pain Management:N/A 4. Mood:LCSW to follow for evaluation and support. -antipsychotic agents: N/A 5. Neuropsych: This patientiscapable of making decisions on hisown behalf. 6. Skin/Wound Care:Routine pressure relief measures. 7. Fluids/Electrolytes/Nutrition:Monitor I/O. Check lytes in am.  8. HTN: Monitor BP tid. Continue Benazepril, Metoprolol and Amlodipine. Lasix on hold.   -fair control 5/10 9. CKD Stage III: Baseline SCr around 2.2. BUN/SCr- 53/2.05 at admission--now down to 39/1.64. Avoid nephrotoxic medications.   -Avoid hypoperfusionpost-stroke.  10. Nausea/Vomiting:Denies symptoms at present 11. CAD/PVD: On low dose ASA, Lipitor, Eliquis and BB 12. T2DM: Monitor BS ac/hs --Was on Tujeo 300- 27 units  daily with humalog 11 units bid lunch/supper. Continue SSI for now  - CBG (last 3)  Recent Labs    03/23/19 1620 03/23/19 2120 03/24/19 0633  GLUCAP 185* 184* 152*    -controlled 5/13  -began lantus 5 units daily in place of Tujeo on 5/9---observe today 13. Squamous cell lung cancer: S/p XRT 14. Recent PNA/Hypoxia:Was completing course of Levaquin at admission. afebrile -continued use of flutter valve.  -cxr from 5/4 with increased right hilar mass, question recurrence of carcinoma, pulmonary follow-up.  The patient also may have a hypercoagulable state from a paraneoplastic syndrome, will ask hematology to evaluate -doxycycline bid will cont x 14doses  -add mucinex  -encourage FV/IS/OOB  -Appreciate Dr. ELoanne Drilling pulmonary input- need prior CT chest for review, ask if Dr PJoylene Drafthas reports, may need bronch next wk  LOS: 5 days A FACE TO FACE EVALUATION WAS PERFORMED  ACharlett Blake5/13/2020, 9:18 AM

## 2019-03-24 NOTE — Progress Notes (Signed)
Speech Language Pathology Daily Session Note  Patient Details  Name: Travis [name] MRN: [id number] Date of Birth: [birth year]  Today's Date: 03/24/2019 SLP Individual Time: 1747-1595 SLP Individual Time Calculation (min): 55 min  Short Term Goals: Week 1: SLP Short Term Goal 1 (Week 1): Given supervision cues, pt wil recall speech intelligibility strategies in 8 out of 10 opportunities.  SLP Short Term Goal 2 (Week 1): Given Min A cues, pt will utilize speech intelligibility strategies at the sentence level to achieve ~ 90% intelligibility.   Skilled Therapeutic Interventions: Skilled treatment session focused on speech intelligibility goals. SLP facilitated session by providing supervision level verbal cues for patient to self-monitor and correct errors at the sentence level while reading aloud in a moderately distracting environment (music playing). Patient demonstrated increased difficulty with consonant blends and mult-syllabic words (more than 3) which patient demonstrated appropriate emergent awareness of and was ~80% intelligibile. Patient left sitting EOB with alarm on and all needs within reach. Continue with current plan of care.      Pain No/Denies Pain   Therapy/Group: Individual Therapy  Clif Serio 03/24/2019, 12:34 PM

## 2019-03-24 NOTE — Progress Notes (Signed)
Occupational Therapy Session Note  Patient Details  Name: Travis [name] MRN: [id number] Date of Birth: 16-Jul-1951  Today's Date: 03/24/2019 OT Individual Time: 6438-3779 OT Individual Time Calculation (min): 55 min    Short Term Goals: Week 1:  OT Short Term Goal 1 (Week 1): STGs=LTGs due to ELOS  Skilled Therapeutic Interventions/Progress Updates:    Upon entering the room, pt seated on EOB with no c/o pain and agreeable to OT intervention. O2 removed this session with pt remaining at or above 94% on room air. Pt ambulating with RW to dresser to obtain clothing items with close supervision for safety. Pt ambulating to bathroom for bathing tasks at shower level. Pt needing min cuing for safety awareness to sit when doffing clothing items. Pt transferred onto TTB with close supervision and bathing from shower level with min guard with sit <>stand to wash buttocks. Pt donning clothing items from position on edge of TTB with supervision overall and min guard for LB clothing management. Pt taking seated rest break and then standing for grooming tasks at sink with supervision as well. Pt seated in recliner chair at end of session with call bell and all needs within reach. Chair alarm discontinued with team being agreeing.  Therapy Documentation Precautions:  Precautions Precautions: Fall Precaution Comments: Ataxic, LEs>UEs Restrictions Weight Bearing Restrictions: No General:   Vital Signs: Therapy Vitals Pulse Rate: 77 BP: 121/74 Oxygen Therapy SpO2: 91 % O2 Device: Nasal Cannula O2 Flow Rate (L/min): 2 L/min Pain:   ADL: ADL Eating: Not assessed Grooming: Contact guard Where Assessed-Grooming: Standing at sink Upper Body Bathing: Supervision/safety Where Assessed-Upper Body Bathing: Edge of bed Lower Body Bathing: Minimal assistance Where Assessed-Lower Body Bathing: Edge of bed Upper Body Dressing: Supervision/safety Where Assessed-Upper Body Dressing: Edge of bed Lower Body  Dressing: Minimal assistance Where Assessed-Lower Body Dressing: Edge of bed Toileting: Minimal assistance Where Assessed-Toileting: Bedside Commode Toilet Transfer: Minimal assistance Toilet Transfer Method: Counselling psychologist: Grab bars(RW) Tub/Shower Transfer: Not assessed Vision   Perception    Praxis   Exercises:   Other Treatments:     Therapy/Group: Individual Therapy  [name] 03/24/2019, 10:27 AM

## 2019-03-25 ENCOUNTER — Inpatient Hospital Stay (HOSPITAL_COMMUNITY): Payer: Medicare Other | Admitting: Physical Therapy

## 2019-03-25 ENCOUNTER — Inpatient Hospital Stay (HOSPITAL_COMMUNITY): Payer: Medicare Other | Admitting: Occupational Therapy

## 2019-03-25 ENCOUNTER — Inpatient Hospital Stay (HOSPITAL_COMMUNITY): Payer: Medicare Other | Admitting: Speech Pathology

## 2019-03-25 ENCOUNTER — Inpatient Hospital Stay (HOSPITAL_COMMUNITY): Payer: Medicare Other

## 2019-03-25 DIAGNOSIS — Z01810 Encounter for preprocedural cardiovascular examination: Secondary | ICD-10-CM

## 2019-03-25 LAB — GLUCOSE, CAPILLARY
Glucose-Capillary: 117 mg/dL — ABNORMAL HIGH (ref 70–99)
Glucose-Capillary: 156 mg/dL — ABNORMAL HIGH (ref 70–99)
Glucose-Capillary: 179 mg/dL — ABNORMAL HIGH (ref 70–99)
Glucose-Capillary: 136 mg/dL — ABNORMAL HIGH (ref 70–99)

## 2019-03-25 LAB — PROTEIN C ACTIVITY: Protein C Activity: 104 % (ref 73–180)

## 2019-03-25 LAB — PROTEIN S, TOTAL: Protein S Ag, Total: 123 % (ref 60–150)

## 2019-03-25 LAB — PROTEIN S ACTIVITY: Protein S Activity: 134 % (ref 63–140)

## 2019-03-25 MED ORDER — IPRATROPIUM-ALBUTEROL 0.5-2.5 (3) MG/3ML IN SOLN: 3.0000 mL | Freq: Four times a day (QID) | RESPIRATORY_TRACT | Status: DC | PRN

## 2019-03-25 MED ORDER — IPRATROPIUM-ALBUTEROL 0.5-2.5 (3) MG/3ML IN SOLN
3.0000 mL | Freq: Two times a day (BID) | RESPIRATORY_TRACT | Status: DC
  Filled 2019-03-25: qty 3

## 2019-03-25 NOTE — Progress Notes (Signed)
[place] PHYSICAL MEDICINE & REHABILITATION PROGRESS NOTE   Subjective/Complaints:  No calf pain no SOB  ROS: Patient denies , nausea, vomiting, diarrhea,   shortness of breath or chest pain, joint or back pain,  Objective:   No results found. No results for input(s): WBC, HGB, HCT, PLT in the last 72 hours. No results for input(s): NA, K, CL, CO2, GLUCOSE, BUN, CREATININE, CALCIUM in the last 72 hours.  Intake/Output Summary (Last 24 hours) at 03/25/2019 0905 Last data filed at 03/25/2019 0725 Gross per 24 hour  Intake 476 ml  Output 725 ml  Net -249 ml     Physical Exam: Vital Signs Blood pressure (!) 145/64, pulse 75, temperature 98.2 F (36.8 C), temperature source Oral, resp. rate 19, weight 93.8 kg, SpO2 93 %. Constitutional: No distress . Vital signs reviewed. HEENT: EOMI, oral membranes moist Neck: supple Cardiovascular: RRR without murmur. No JVD    Respiratory: occasional exp wheeze right side, intermittent productive cough  GI: BS +, non-tender, non-distended   Neurological: He isalertand oriented to person, place, and time. He displaysnormal reflexes. Nocranial nerve deficit. He exhibitsnormal muscle tone.Coordinationabnormal.  Mild right facial weakness with /mild dysarthria .  Good insight and awareness.  Patient frustrated by dysarthria. Ataxia RUE>RLE with FTN and HTS. Strength 4+/5 RUE and RLE and 5/5 LUE and LLE. No sensory findings. Right upper extremity dysmetria improved to a mild/moderate level Skin: Skin iswarmand dry. He isnot diaphoretic.  Psychiatric: pleasant.     Assessment/Plan: 1. Functional deficits secondary to right cerebellar infarct which require 3+ hours per day of interdisciplinary therapy in a comprehensive inpatient rehab setting.  Physiatrist is providing close team supervision and 24 hour management of active medical problems listed below.  Physiatrist and rehab team continue to assess barriers to discharge/monitor  patient progress toward functional and medical goals  Care Tool:  Bathing    Body parts bathed by patient: Right arm, Left arm, Chest, Abdomen, Front perineal area, Buttocks, Right upper leg, Left upper leg, Right lower leg, Left lower leg, Face         Bathing assist Assist Level: Contact Guard/Touching assist     Upper Body Dressing/Undressing Upper body dressing   What is the patient wearing?: Pull over shirt    Upper body assist Assist Level: Supervision/Verbal cueing    Lower Body Dressing/Undressing Lower body dressing            Lower body assist Assist for lower body dressing: Contact Guard/Touching assist     Toileting Toileting Toileting Activity did not occur (Clothing management and hygiene only): N/A (no void or bm)  Toileting assist Assist for toileting: Independent with assistive device Assistive Device Comment: urinal    Transfers Chair/bed transfer  Transfers assist     Chair/bed transfer assist level: Supervision/Verbal cueing     Locomotion Ambulation   Ambulation assist      Assist level: Supervision/Verbal cueing Assistive device: Walker-rolling Max distance: 30'   Walk 10 feet activity   Assist     Assist level: Supervision/Verbal cueing Assistive device: Walker-rolling   Walk 50 feet activity   Assist    Assist level: Supervision/Verbal cueing Assistive device: Walker-rolling    Walk 150 feet activity   Assist Walk 150 feet activity did not occur: Safety/medical concerns  Assist level: Contact Guard/Touching assist Assistive device: Walker-rolling    Walk 10 feet on uneven surface  activity   Assist Walk 10 feet on uneven surfaces activity did not occur: Safety/medical concerns  Wheelchair     Assist   Type of Wheelchair: Agricultural engineer assist level: Supervision/Verbal cueing Max wheelchair distance: 149ft    Wheelchair 50 feet with 2 turns activity    Assist         Assist Level: Supervision/Verbal cueing   Wheelchair 150 feet activity     Assist     Assist Level: Supervision/Verbal cueing    Medical Problem List and Plan: 1.Functional and mobility deficitssecondary to right cerebellar infarct -continue CIR therapies today including PT, OT, and SLP  Cont rehab 2. Antithrombotics: -Acute leftDVT/PE dx-10/2018/anticoagulation:Pharmaceutical:Other (comment)--Eliquis treatment dose X 2 weeks followed by maintaince dose. pt declines chronic enoxaparin although he states "I'll do it if I have to" , discussed potential IVC filter, repeat doppler LE to see if there are any new DVTs since last doppler 1 wk ago , at this point plan is to cont current meds, I discussed this with pt's PCP Dr Haskel Schroeder therapy: ASA 3. Pain Management:N/A 4. Mood:LCSW to follow for evaluation and support. -antipsychotic agents: N/A 5. Neuropsych: This patientiscapable of making decisions on hisown behalf. 6. Skin/Wound Care:Routine pressure relief measures. 7. Fluids/Electrolytes/Nutrition:Monitor I/O. Check lytes in am.  8. HTN: Monitor BP tid. Continue Benazepril, Metoprolol and Amlodipine. Lasix on hold.   -fair control 5/10 9. CKD Stage III: Baseline SCr around 2.2. BUN/SCr- 53/2.05 at admission--now down to 39/1.64. Avoid nephrotoxic medications.   -Avoid hypoperfusionpost-stroke.  10. Nausea/Vomiting:Denies symptoms at present 11. CAD/PVD: On low dose ASA, Lipitor, Eliquis and BB 12. T2DM: Monitor BS ac/hs --Was on Tujeo 300- 27 units daily with humalog 11 units bid lunch/supper. Continue SSI for now  - CBG (last 3)  Recent Labs    03/24/19 1639 03/24/19 2125 03/25/19 0603  GLUCAP 251* 159* 117*    -controlled 5/14  -began lantus 5 units daily in place of Tujeo on 5/9---observe today 13. Squamous cell lung cancer: S/p XRT 14. Recent PNA/Hypoxia:Was completing course of Levaquin at  admission. afebrile -continued use of flutter valve.  -cxr from 5/4 with increased right hilar mass, question recurrence of carcinoma, pulmonary follow-up.  The patient also may have a hypercoagulable state from a paraneoplastic syndrome, will ask hematology to evaluate -doxycycline bid will cont x 14doses  -add mucinex  -encourage FV/IS/OOB  -Appreciate Dr. [name], pulmonary input- need prior CT chest for review,per Dr Joylene Draft this has been followed at [facility] , outpt f/u  LOS: 6 days A FACE TO [place] 03/25/2019, 9:05 AM

## 2019-03-25 NOTE — Progress Notes (Signed)
Occupational Therapy Session Note  Patient Details  Name: Travis [name] MRN: [id number] Date of Birth: 1951/03/27  Today's Date: 03/25/2019 OT Individual Time: 7282-0601 OT Individual Time Calculation (min): 56 min    Short Term Goals: Week 1:  OT Short Term Goal 1 (Week 1): STGs=LTGs due to ELOS  Skilled Therapeutic Interventions/Progress Updates:    Upon entering the room, pt seated on EOB awaiting OT arrival with no c/o pain. OT discussed upcoming discharge date , equipment needs, and follow up [place] intervention. Pt verbalized understanding and agreement. OT also offered to call wife to discuss goals as no current need for hands on family education with pt at higher level but pt declined at this time. PT will follow up with pt later today. Pt declined shower but ambulating with RW with supervision to sink and standing for grooming tasks at mod I level. Pt requesting hand exercise program and provided with paper handouts for strengthening and coordination with use of theraputty. OT demonstrated use of medium soft putty and pt returning demonstrations with R UE and needing min verbal cuing for proper technique. Pt remained seated in wheelchair at end of session with call bell and all needed items within reach upon exiting the room.   Therapy Documentation Precautions:  Precautions Precautions: Fall Precaution Comments: Ataxic, LEs>UEs Restrictions Weight Bearing Restrictions: No General:   Vital Signs: Therapy Vitals Temp: 98.2 F (36.8 C) Temp Source: Oral Pulse Rate: 75 Resp: 19 BP: (!) 145/64 Patient Position (if appropriate): Lying Oxygen Therapy SpO2: 93 % O2 Device: Room Air Pain:   ADL: ADL Eating: Not assessed Grooming: Contact guard Where Assessed-Grooming: Standing at sink Upper Body Bathing: Supervision/safety Where Assessed-Upper Body Bathing: Edge of bed Lower Body Bathing: Minimal assistance Where Assessed-Lower Body Bathing: Edge of bed Upper Body Dressing:  Supervision/safety Where Assessed-Upper Body Dressing: Edge of bed Lower Body Dressing: Minimal assistance Where Assessed-Lower Body Dressing: Edge of bed Toileting: Minimal assistance Where Assessed-Toileting: Bedside Commode Toilet Transfer: Minimal assistance Toilet Transfer Method: Counselling psychologist: Grab bars(RW) Tub/Shower Transfer: Not assessed Vision   Perception    Praxis   Exercises:   Other Treatments:     Therapy/Group: Individual Therapy  [name] 03/25/2019, 8:56 AM

## 2019-03-25 NOTE — Discharge Summary (Signed)
[facility] Discharge Summary   Patient: Travis [name] HEN:[id number]   PCP: [name], MD DOB: [birth year]   Date of admission: 03/15/2019   Date of discharge: 03/19/2019     Discharge Diagnoses:   Principal Problem:   CVA (cerebral vascular accident) [facility]) Active Problems:   HYPERCHOLESTEROLEMIA   Essential hypertension, benign   CORONARY ATHEROSCLEROSIS NATIVE CORONARY ARTERY   PVD   CKD (chronic kidney disease) stage 3, GFR 30-59 ml/min (HCC)   Nausea & vomiting   Admitted From: home Disposition:  CIR  Recommendations for Outpatient Follow-up:  1. Please follow up with PCP in 1 week   Follow-up Information    [name], MD. Schedule an appointment as soon as possible for a visit in 1 week(s).   Specialty:  Internal Medicine Contact information: [address] [place] [address] [phone]        oncology. Schedule an appointment as soon as possible for a visit in 1 week(s).        [facility]. Schedule an appointment as soon as possible for a visit in 4 week(s).   Specialty:  Neurology Contact information: [place] [phone]         Diet recommendation: cardiac diet  Activity: The patient is advised to gradually reintroduce usual activities.  Discharge Condition: good  Code Status: full code  History of present illness: As per the H and P dictated on admission, "Travis [name] is a 68 y.o. male with medical history significant of DM2, HLD, COPD, CAD; presents with nausea, vomiting, and dizziness. He notes that at about 0100hrs on 03/15/2019 he felt acutely nauseous. As he tried to get up, he became dizzy and began to vomit. He had several episodes of emesis that were accompanied by uncontrolled coughing. The vomitus was dark, but no blood noted. He felt off balance and his right arm became weak. He also notes that he had problems with expressing his words that started around  2200hrs 03/14/2019. He denies a syncopal episode and also notes that no one in the house was awake to witness this event. He became concerned about his symptoms can came to [facility] ED.    "  Hospital Course:  Summary of his active problems in the hospital is as following. Acute right SCA territory cerebellar infarct neurology onboard [facility]: Acute to subacute RIGHT cerebellar infarct. No evidence of hemorrhage. MRI/MRA brain: IMPRESSION: 3 cm region of acute infarction in the right superolateral cerebellum. No hemorrhage or mass effect.  A1c 8.1 Tchol: 90, HDL: 25, LDL: 46  Consult PT/OT/SLP, CIR recommended Prior to admission patient was already on 81 mg aspirin and Eliquis.  After discharge we will continue the same.  HTN amlodipine 10mg  qday, benazepril 40mg  qday, Lasix is on hold  HLD lipitor 40mg   CAD ASA, statin, BB eliquis  PVD see above  CKD stage 3 watch nephrotoxins; monitor  N/V related to CVA antiemetics PRN, fluids resolved  DM2 takes toujeo at home and humalog SSI A1c 8.1  Complete occlusion of the right upper lobe bronchus,?  Radiation injury Incentive spirometry flutter.  Currently not hypoxic. Recommend outpatient follow-up with pulmonary.  acute deep vein thrombosis involving the left posterior tibial vein On apixaban,?  Apixaban failure May need to transition to lovenox but patient currently refusing to transition from Eliquis. Recommend outpatient follow-up with medical oncology.  Acute kidney injury on chronic kidney disease stage II. Renal function improving. Currently holding Lasix. Neurology  recommending CTA neck if the creatinine is improving.  Patient was seen by physical therapy, who recommended CIR, which was arranged by case manager. On the day of the discharge the patient's vitals were stable, and no other acute medical condition were reported by patient. the patient was felt safe to be discharge at [facility] with  therapy.  Consultants: neurology Procedures: Echocardiogram   DISCHARGE MEDICATION: Allergies as of 03/19/2019      Reactions   Penicillins Other (See Comments)   UNSPECIFIED CHILDHOOD REACTION Has patient had PCN reaction causing immediate rash, facial/tongue/throat swelling, SOB or lightheadedness with hypotension: Unknown Has patient had a PCN reaction causing severe rash involving mucus membranes or skin necrosis: Unknown Has patient had a PCN reaction that required hospitalization: No Has patient had a PCN reaction occurring within the last 10 years: No If all of the above answers are "NO", then may proceed with Cephalosporin use.      Medication List    STOP taking these medications   diclofenac sodium 1 % Gel Commonly known as:  VOLTAREN   Eliquis 5 MG Tabs tablet Generic drug:  apixaban   furosemide 80 MG tablet Commonly known as:  LASIX   insulin lispro 100 UNIT/ML injection Commonly known as:  HUMALOG   levofloxacin 250 MG tablet Commonly known as:  LEVAQUIN   promethazine-codeine 6.25-10 MG/5ML syrup Commonly known as:  PHENERGAN with CODEINE   Toujeo SoloStar 300 UNIT/ML Sopn Generic drug:  Insulin Glargine (1 Unit Dial)     TAKE these medications   acetaminophen 325 MG tablet Commonly known as:  TYLENOL Take 2 tablets (650 mg total) by mouth every 4 (four) hours as needed for mild pain (or temp > 37.5 C (99.5 F)). What changed:    medication strength  how much to take  when to take this  reasons to take this   allopurinol 100 MG tablet Commonly known as:  ZYLOPRIM Take 100 mg by mouth 2 (two) times daily.   amLODipine 10 MG tablet Commonly known as:  NORVASC Take 10 mg by mouth daily.   aspirin EC 81 MG tablet Take 1 tablet (81 mg total) by mouth daily.   benazepril 40 MG tablet Commonly known as:  LOTENSIN Take 40 mg by mouth at bedtime.   benzonatate 100 MG capsule Commonly known as:  TESSALON Take 100 mg by mouth 3 (three) times  daily as needed for cough.   calcitRIOL 0.25 MCG capsule Commonly known as:  ROCALTROL Take 0.25 mcg by mouth daily.   Cinnamon 500 MG capsule Take 500 mg by mouth daily.   guaiFENesin 600 MG 12 hr tablet Commonly known as:  MUCINEX Take 1 tablet (600 mg total) by mouth 2 (two) times daily.   insulin aspart 100 UNIT/ML injection Commonly known as:  novoLOG Inject 0-15 Units into the skin 3 (three) times daily with meals.   metoprolol tartrate 100 MG tablet Commonly known as:  LOPRESSOR Take 100 mg by mouth 2 (two) times daily.   mirtazapine 15 MG tablet Commonly known as:  REMERON Take 15 mg by mouth at bedtime.   ProAir HFA 108 (90 Base) MCG/ACT inhaler Generic drug:  albuterol Inhale 2 puffs into the lungs every 6 (six) hours as needed for wheezing or shortness of breath.   Rivaroxaban 15 & 20 MG Tbpk Take as directed on package: Start with one 15mg  tablet by mouth twice a day with food. On Day 22, switch to one 20mg  tablet once a day  with food.   Turmeric 400 MG Caps Take 400 mg by mouth daily.     ASK your doctor about these medications   doxycycline 100 MG tablet Commonly known as:  VIBRA-TABS Take 1 tablet (100 mg total) by mouth 2 (two) times daily for 4 days. Ask about: Should I take this medication?      Allergies  Allergen Reactions   Penicillins Other (See Comments)    UNSPECIFIED CHILDHOOD REACTION Has patient had PCN reaction causing immediate rash, facial/tongue/throat swelling, SOB or lightheadedness with hypotension: Unknown Has patient had a PCN reaction causing severe rash involving mucus membranes or skin necrosis: Unknown Has patient had a PCN reaction that required hospitalization: No Has patient had a PCN reaction occurring within the last 10 years: No If all of the above answers are "NO", then may proceed with Cephalosporin use.    Discharge Instructions    Ambulatory referral to Pulmonology   Complete by:  As directed    Diet - low  sodium heart healthy   Complete by:  As directed    Increase activity slowly   Complete by:  As directed      Discharge Exam: Filed Weights   03/15/19 0955  Weight: 95.3 kg   Vitals:   03/19/19 1145 03/19/19 1423  BP: 136/71   Pulse: 70   Resp: 17   Temp: 97.8 F (36.6 C)   SpO2: 95% 96%   General: Appear in no distress, no Rash; Oral Mucosa moist. Cardiovascular: S1 and S2 Present, no Murmur, no JVD Respiratory: Bilateral Air entry present and no Crackles, upper wheezes Abdomen: Bowel Sound present, Soft and no tenderness Extremities: no Pedal edema, no calf tenderness Neurology: Grossly no focal neuro deficit.  The results of significant diagnostics from this hospitalization (including imaging, microbiology, ancillary and laboratory) are listed below for reference.    Significant Diagnostic Studies: Ct Head Wo Contrast  Result Date: 03/15/2019 CLINICAL DATA:  68 year old male with acute dizziness and ataxia. EXAM: CT HEAD WITHOUT CONTRAST TECHNIQUE: Contiguous axial images were obtained from the base of the skull through the vertex without intravenous contrast. COMPARISON:  None. FINDINGS: Brain: An infarct within the SUPERIOR RIGHT cerebellum appears acute or subacute. There is no evidence of hemorrhage. Mild atrophy and chronic small-vessel white matter ischemic changes are noted. No mass lesion, midline shift, hydrocephalus or extra-axial collection noted. Vascular: Carotid and vertebral atherosclerotic calcifications noted. Skull: Normal. Negative for fracture or focal lesion. Sinuses/Orbits: No acute finding. Other: None. IMPRESSION: 1. Acute to subacute RIGHT cerebellar infarct. No evidence of hemorrhage. 2. Atrophy and chronic small-vessel white matter ischemic changes. Electronically Signed   By: Margarette Canada M.D.   On: 03/15/2019 12:49   Ct Chest W Contrast  Result Date: 03/17/2019 CLINICAL DATA:  Squamous cell carcinoma the lungs. Right cerebellar stroke. EXAM: CT  CHEST, ABDOMEN, AND PELVIS WITH CONTRAST TECHNIQUE: Multidetector CT imaging of the chest, abdomen and pelvis was performed following the standard protocol during bolus administration of intravenous contrast. CONTRAST:  62mL OMNIPAQUE IOHEXOL 300 MG/ML  SOLN COMPARISON:  Chest CT from 06/04/2018 and PET-CT from 08/15/2017 FINDINGS: CT CHEST FINDINGS Cardiovascular: Coronary, aortic arch, and branch vessel atherosclerotic vascular disease. Mild cardiomegaly. Interventricular septal thickening suggesting left ventricular hypertrophy. Mediastinum/Nodes: Right paratracheal node 1.0 cm in short axis on image 19/3, formerly 0.6 cm. Additional small right paratracheal lymph nodes are below 1 cm in diameter but mildly increased from prior exam. Subcarinal node 1.1 cm in short axis on image 30/3,  formerly 0.5 cm. Bilateral gynecomastia. Lungs/Pleura: Small right pleural effusion. There is complete occlusion of the right upper lobe bronchus. There is atelectasis and possibly some mild alveolar filling airspace opacity posteriorly in the right upper lobe, with aerated anterior portion of the right upper lobe. Airway thickening along the bronchus intermedius resulting in some luminal narrowing, with airway thickening centrally in the right middle lobe and right lower lobe. Superior segment right lower lobe airspace opacity on image 67/5 could be from prior radiation therapy though infection is not excluded. Fiducials are noted in the superior segment right lower lobe. Further inferiorly in the vicinity of the prior pulmonary nodule, there is indistinct patchy opacity which could be from radiation pneumonitis or pneumonia, for example as shown on image 93/5. The original nodule is not readily apparent against this background density. There is some associated surrounding ground-glass opacity. There is some faint nodularity peripherally in the left upper lobe. On image 62/5, a triangular 0.4 by 0.3 cm nodule is new or  significantly increased from prior. Indistinct ground-glass density nodule in the left upper lobe measuring 0.9 cm in diameter on image 78/5 is new. Subsegmental atelectasis is present anteriorly in left lower lobe. Small fiducials are present anteriorly in the left lower lobe and there are bandlike opacities along the left hemidiaphragm with crowding of bronchovascular structures suggesting some degree of volume loss. The lobulated nodule along the inferior pulmonary ligament is somewhat obscured by surrounding atelectasis, for example on image 114/5, but is less sharply defined and possibly less prominent. There is some additional nodularity in this vicinity, for example posteriorly on image 112/5 where there is a 1.7 by 1.3 cm ill-defined nodular density, probably inflammatory. Musculoskeletal: Thoracic spondylosis. CT ABDOMEN PELVIS FINDINGS Hepatobiliary: Cholecystectomy. The common bile duct measures 1.1 cm in diameter, not changed from 08/15/2017, probably a physiologic response to cholecystectomy. No focal liver lesion is identified. Pancreas: Unremarkable Spleen: Unremarkable Adrenals/Urinary Tract: 1.7 by 1.8 cm left adrenal mass is unchanged in size from 08/15/2017 and was not previously hypermetabolic, hence likely benign. Small hypodense lesions of the left mid kidney right kidney upper pole probably cysts but technically too small to characterize. Considerable vascular calcifications along the renal hila bilaterally. No definite urinary tract calculi. Stomach/Bowel: Unremarkable Vascular/Lymphatic: Aortoiliac atherosclerotic vascular disease. No pathologic adenopathy identified. Reproductive: Faint calcification in the right posterior prostate gland. Other: No supplemental non-categorized findings. Musculoskeletal: Incidental lipoma in the right vastus lateralis muscle. Degenerative disc disease at L4-5. IMPRESSION: 1. Both the right lower lobe pulmonary nodule on the left lower lobe pulmonary nodule  are reduced in conspicuity with some degree of surrounding airspace opacity, possibly radiation pneumonitis if the patient has had interval radiation therapy. 2. Complete occlusion of the right upper lobe bronchus. Most of the right upper lobe is collapsed potentially with a small amount of airspace filling process; there is air trapping in portions of the right upper lobe anteriorly. 3. Airway thickening centrally in the right lung, resulting in some luminal narrowing of the bronchus intermedius. 4. There is a new 4 mm triangular-shaped left upper lobe pulmonary nodule along with a new small ground-glass density nodule in the left upper lobe which merit surveillance. 5. Mildly enlarged paratracheal and subcarinal lymph nodes likewise merits surveillance although could be reactive. 6. Other imaging findings of potential clinical significance: Aortic Atherosclerosis (ICD10-I70.0). Coronary atherosclerosis. Mild cardiomegaly with suggestion of left ventricular hypertrophy. Small right pleural effusion. Chronically stable small left adrenal mass, not previously hypermetabolic on PET-CT and hence  likely benign. Electronically Signed   By: Van Clines M.D.   On: 03/17/2019 15:11   Mr Jodene Nam Head Wo Contrast  Result Date: 03/15/2019 CLINICAL DATA:  Dizziness.  Abnormal cord in a shin. EXAM: MRI HEAD WITHOUT CONTRAST MRA HEAD WITHOUT CONTRAST TECHNIQUE: Multiplanar, multiecho pulse sequences of the brain and surrounding structures were obtained without intravenous contrast. Angiographic images of the head were obtained using MRA technique without contrast. COMPARISON:  Head CT same day FINDINGS: MRI HEAD FINDINGS Brain: Diffusion imaging confirms the presence of a 3 cm region of acute infarction in the right superior and lateral cerebellum. Mild swelling but no hemorrhage or mass effect. No other acute infarction. Elsewhere, there chronic small-vessel ischemic changes of the pons. There are moderate chronic  small-vessel ischemic changes of the cerebral hemispheric white matter. No large vessel territory infarction. No mass lesion, hemorrhage, hydrocephalus or extra-axial collection. Vascular: Major vessels at the base of the brain show flow. Skull and upper cervical spine: Negative Sinuses/Orbits: Clear/normal Other: None MRA HEAD FINDINGS Both internal carotid arteries are widely patent through the skull base and siphon regions. The anterior and middle cerebral vessels are patent without proximal stenosis, aneurysm or vascular malformation. Both vertebral arteries are patent at the foramen magnum and to the basilar. No basilar stenosis. Posterior inferior cerebellar arteries show flow on each side. Right anterior inferior cerebellar artery is seen. Both superior cerebellar arteries show flow presently. Both posterior cerebral arteries show flow. IMPRESSION: 3 cm region of acute infarction in the right superolateral cerebellum. No hemorrhage or mass effect. Moderate chronic small-vessel ischemic changes of the pons and cerebral hemispheric white matter. Intracranial MR angiography negative for large or medium vessel occlusion. Specifically, no proximal posterior circulation occlusion seen to explain the infarction. Electronically Signed   By: Nelson Chimes M.D.   On: 03/15/2019 14:56   Mr Brain Wo Contrast  Result Date: 03/15/2019 CLINICAL DATA:  Dizziness.  Abnormal cord in a shin. EXAM: MRI HEAD WITHOUT CONTRAST MRA HEAD WITHOUT CONTRAST TECHNIQUE: Multiplanar, multiecho pulse sequences of the brain and surrounding structures were obtained without intravenous contrast. Angiographic images of the head were obtained using MRA technique without contrast. COMPARISON:  Head CT same day FINDINGS: MRI HEAD FINDINGS Brain: Diffusion imaging confirms the presence of a 3 cm region of acute infarction in the right superior and lateral cerebellum. Mild swelling but no hemorrhage or mass effect. No other acute infarction.  Elsewhere, there chronic small-vessel ischemic changes of the pons. There are moderate chronic small-vessel ischemic changes of the cerebral hemispheric white matter. No large vessel territory infarction. No mass lesion, hemorrhage, hydrocephalus or extra-axial collection. Vascular: Major vessels at the base of the brain show flow. Skull and upper cervical spine: Negative Sinuses/Orbits: Clear/normal Other: None MRA HEAD FINDINGS Both internal carotid arteries are widely patent through the skull base and siphon regions. The anterior and middle cerebral vessels are patent without proximal stenosis, aneurysm or vascular malformation. Both vertebral arteries are patent at the foramen magnum and to the basilar. No basilar stenosis. Posterior inferior cerebellar arteries show flow on each side. Right anterior inferior cerebellar artery is seen. Both superior cerebellar arteries show flow presently. Both posterior cerebral arteries show flow. IMPRESSION: 3 cm region of acute infarction in the right superolateral cerebellum. No hemorrhage or mass effect. Moderate chronic small-vessel ischemic changes of the pons and cerebral hemispheric white matter. Intracranial MR angiography negative for large or medium vessel occlusion. Specifically, no proximal posterior circulation occlusion seen to explain the  infarction. Electronically Signed   By: Nelson Chimes M.D.   On: 03/15/2019 14:56   Ct Abdomen Pelvis W Contrast  Result Date: 03/17/2019 CLINICAL DATA:  Squamous cell carcinoma the lungs. Right cerebellar stroke. EXAM: CT CHEST, ABDOMEN, AND PELVIS WITH CONTRAST TECHNIQUE: Multidetector CT imaging of the chest, abdomen and pelvis was performed following the standard protocol during bolus administration of intravenous contrast. CONTRAST:  39mL OMNIPAQUE IOHEXOL 300 MG/ML  SOLN COMPARISON:  Chest CT from 06/04/2018 and PET-CT from 08/15/2017 FINDINGS: CT CHEST FINDINGS Cardiovascular: Coronary, aortic arch, and branch vessel  atherosclerotic vascular disease. Mild cardiomegaly. Interventricular septal thickening suggesting left ventricular hypertrophy. Mediastinum/Nodes: Right paratracheal node 1.0 cm in short axis on image 19/3, formerly 0.6 cm. Additional small right paratracheal lymph nodes are below 1 cm in diameter but mildly increased from prior exam. Subcarinal node 1.1 cm in short axis on image 30/3, formerly 0.5 cm. Bilateral gynecomastia. Lungs/Pleura: Small right pleural effusion. There is complete occlusion of the right upper lobe bronchus. There is atelectasis and possibly some mild alveolar filling airspace opacity posteriorly in the right upper lobe, with aerated anterior portion of the right upper lobe. Airway thickening along the bronchus intermedius resulting in some luminal narrowing, with airway thickening centrally in the right middle lobe and right lower lobe. Superior segment right lower lobe airspace opacity on image 67/5 could be from prior radiation therapy though infection is not excluded. Fiducials are noted in the superior segment right lower lobe. Further inferiorly in the vicinity of the prior pulmonary nodule, there is indistinct patchy opacity which could be from radiation pneumonitis or pneumonia, for example as shown on image 93/5. The original nodule is not readily apparent against this background density. There is some associated surrounding ground-glass opacity. There is some faint nodularity peripherally in the left upper lobe. On image 62/5, a triangular 0.4 by 0.3 cm nodule is new or significantly increased from prior. Indistinct ground-glass density nodule in the left upper lobe measuring 0.9 cm in diameter on image 78/5 is new. Subsegmental atelectasis is present anteriorly in left lower lobe. Small fiducials are present anteriorly in the left lower lobe and there are bandlike opacities along the left hemidiaphragm with crowding of bronchovascular structures suggesting some degree of volume  loss. The lobulated nodule along the inferior pulmonary ligament is somewhat obscured by surrounding atelectasis, for example on image 114/5, but is less sharply defined and possibly less prominent. There is some additional nodularity in this vicinity, for example posteriorly on image 112/5 where there is a 1.7 by 1.3 cm ill-defined nodular density, probably inflammatory. Musculoskeletal: Thoracic spondylosis. CT ABDOMEN PELVIS FINDINGS Hepatobiliary: Cholecystectomy. The common bile duct measures 1.1 cm in diameter, not changed from 08/15/2017, probably a physiologic response to cholecystectomy. No focal liver lesion is identified. Pancreas: Unremarkable Spleen: Unremarkable Adrenals/Urinary Tract: 1.7 by 1.8 cm left adrenal mass is unchanged in size from 08/15/2017 and was not previously hypermetabolic, hence likely benign. Small hypodense lesions of the left mid kidney right kidney upper pole probably cysts but technically too small to characterize. Considerable vascular calcifications along the renal hila bilaterally. No definite urinary tract calculi. Stomach/Bowel: Unremarkable Vascular/Lymphatic: Aortoiliac atherosclerotic vascular disease. No pathologic adenopathy identified. Reproductive: Faint calcification in the right posterior prostate gland. Other: No supplemental non-categorized findings. Musculoskeletal: Incidental lipoma in the right vastus lateralis muscle. Degenerative disc disease at L4-5. IMPRESSION: 1. Both the right lower lobe pulmonary nodule on the left lower lobe pulmonary nodule are reduced in conspicuity with some degree  of surrounding airspace opacity, possibly radiation pneumonitis if the patient has had interval radiation therapy. 2. Complete occlusion of the right upper lobe bronchus. Most of the right upper lobe is collapsed potentially with a small amount of airspace filling process; there is air trapping in portions of the right upper lobe anteriorly. 3. Airway thickening centrally  in the right lung, resulting in some luminal narrowing of the bronchus intermedius. 4. There is a new 4 mm triangular-shaped left upper lobe pulmonary nodule along with a new small ground-glass density nodule in the left upper lobe which merit surveillance. 5. Mildly enlarged paratracheal and subcarinal lymph nodes likewise merits surveillance although could be reactive. 6. Other imaging findings of potential clinical significance: Aortic Atherosclerosis (ICD10-I70.0). Coronary atherosclerosis. Mild cardiomegaly with suggestion of left ventricular hypertrophy. Small right pleural effusion. Chronically stable small left adrenal mass, not previously hypermetabolic on PET-CT and hence likely benign. Electronically Signed   By: Van Clines M.D.   On: 03/17/2019 15:11   Dg Chest Port 1 View  Result Date: 03/15/2019 CLINICAL DATA:  Cough.  History of lung cancer. EXAM: PORTABLE CHEST 1 VIEW COMPARISON:  10/23/2018 and CT 06/04/2018 FINDINGS: Lungs are hypoinflated with worsening masslike opacification over the right suprahilar/paramediastinal region with mild mixed interstitial airspace density over the more peripheral right upper lobe/apex. No evidence of effusion. Cardiomediastinal silhouette and remainder of the exam is unchanged. IMPRESSION: Mild interval worsening masslike opacity over the right suprahilar/paramediastinal region likely patient's known right lung cancer. Minimal mixed interstitial airspace density more peripherally over the right upper lobe/apex. Electronically Signed   By: Marin Olp M.D.   On: 03/15/2019 11:22   Vas [place] Transcranial Doppler W Bubbles  Result Date: 03/22/2019  Transcranial Doppler with Bubble Indications: Stroke. Performing Technologist: Abram Sander RVS  Examination Guidelines: A complete evaluation includes B-mode imaging, spectral Doppler, color Doppler, and power Doppler as needed of all accessible portions of each vessel. Bilateral testing is considered an  integral part of a complete examination. Limited examinations for reoccurring indications may be performed as noted.  Summary:  A vascular evaluation was performed. The left middle cerebral artery was studied. An IV was inserted into the patient's right forearm. Verbal informed consent was obtained.  Interpretation: HITS heard at rest: None HITS heard during valsalva: None Negative TCD Bubble study *See table(s) above for measurements and observations.  Diagnosing physician: Antony Contras MD Electronically signed by Antony Contras MD on 03/22/2019 at 2:02:31 PM.    Final    Vas [place] Lower Extremity Venous (dvt)  Result Date: 03/18/2019  Lower Venous Study Indications: Stroke.  Performing Technologist: Maudry Mayhew MHA, RDMS, RVT, RDCS  Examination Guidelines: A complete evaluation includes B-mode imaging, spectral Doppler, color Doppler, and power Doppler as needed of all accessible portions of each vessel. Bilateral testing is considered an integral part of a complete examination. Limited examinations for reoccurring indications may be performed as noted.  +---------+---------------+---------+-----------+----------+-------+  RIGHT     Compressibility Phasicity Spontaneity Properties Summary  +---------+---------------+---------+-----------+----------+-------+  CFV       Full            Yes       Yes                             +---------+---------------+---------+-----------+----------+-------+  SFJ       Full                                                      +---------+---------------+---------+-----------+----------+-------+  FV Prox   Full                                                      +---------+---------------+---------+-----------+----------+-------+  FV Mid    Full                                                      +---------+---------------+---------+-----------+----------+-------+  FV Distal Full                                                       +---------+---------------+---------+-----------+----------+-------+  PFV       Full                                                      +---------+---------------+---------+-----------+----------+-------+  POP       Full            Yes       Yes                             +---------+---------------+---------+-----------+----------+-------+  PTV       Full                                                      +---------+---------------+---------+-----------+----------+-------+  PERO      Full                                                      +---------+---------------+---------+-----------+----------+-------+   +---------+---------------+---------+-----------+----------+-------+  LEFT      Compressibility Phasicity Spontaneity Properties Summary  +---------+---------------+---------+-----------+----------+-------+  CFV       Full            Yes       Yes                             +---------+---------------+---------+-----------+----------+-------+  SFJ       Full                                                      +---------+---------------+---------+-----------+----------+-------+  FV Prox   Full                                                      +---------+---------------+---------+-----------+----------+-------+  FV Mid    Full                                                      +---------+---------------+---------+-----------+----------+-------+  FV Distal Full                                                      +---------+---------------+---------+-----------+----------+-------+  PFV       Full                                                      +---------+---------------+---------+-----------+----------+-------+  POP       Full            Yes       Yes                             +---------+---------------+---------+-----------+----------+-------+  PTV       None                      No                     Acute    +---------+---------------+---------+-----------+----------+-------+  PERO      Full                                                       +---------+---------------+---------+-----------+----------+-------+     Summary: Right: There is no evidence of deep vein thrombosis in the lower extremity. No cystic structure found in the popliteal fossa. Left: Findings consistent with acute deep vein thrombosis involving the left posterior tibial vein. No cystic structure found in the popliteal fossa.  *See table(s) above for measurements and observations. Electronically signed by Servando Snare MD on 03/18/2019 at 4:34:38 PM.    Final     Microbiology: Recent Results (from the past 240 hour(s))  SARS Coronavirus 2 (CEPHEID- Performed in [facility] hospital lab), Hosp Order     Status: None   Collection Time: 03/15/19 10:46 AM  Result Value Ref Range Status   SARS Coronavirus 2 NEGATIVE NEGATIVE Final    Comment: (NOTE) If result is NEGATIVE SARS-CoV-2 target nucleic acids are NOT DETECTED. The SARS-CoV-2 RNA is generally detectable in upper and lower  respiratory specimens during the acute phase of infection. The lowest  concentration of SARS-CoV-2 viral copies this assay can detect is 250  copies / mL. A negative result does not preclude SARS-CoV-2 infection  and should not be used as the sole basis for treatment or other  patient management decisions.  A negative result may occur with  improper specimen collection / handling, submission of specimen other  than nasopharyngeal swab, presence of viral mutation(s) within the  areas targeted by this assay, and inadequate number of viral copies  (<250  copies / mL). A negative result must be combined with clinical  observations, patient history, and epidemiological information. If result is POSITIVE SARS-CoV-2 target nucleic acids are DETECTED. The SARS-CoV-2 RNA is generally detectable in upper and lower  respiratory specimens dur ing the acute phase of infection.  Positive  results are indicative of active infection with SARS-CoV-2.   Clinical  correlation with patient history and other diagnostic information is  necessary to determine patient infection status.  Positive results do  not rule out bacterial infection or co-infection with other viruses. If result is PRESUMPTIVE POSTIVE SARS-CoV-2 nucleic acids MAY BE PRESENT.   A presumptive positive result was obtained on the submitted specimen  and confirmed on repeat testing.  While 2019 novel coronavirus  (SARS-CoV-2) nucleic acids may be present in the submitted sample  additional confirmatory testing may be necessary for epidemiological  and / or clinical management purposes  to differentiate between  SARS-CoV-2 and other Sarbecovirus currently known to infect humans.  If clinically indicated additional testing with an alternate test  methodology [phone]) is advised. The SARS-CoV-2 RNA is generally  detectable in upper and lower respiratory sp ecimens during the acute  phase of infection. The expected result is Negative. Fact Sheet for Patients:  StrictlyIdeas.no Fact Sheet for Healthcare Providers: BankingDealers.co.za This test is not yet approved or cleared by the [place] FDA and has been authorized for detection and/or diagnosis of SARS-CoV-2 by FDA under an Emergency Use Authorization (EUA).  This EUA will remain in effect (meaning this test can be used) for the duration of the COVID-19 declaration under Section 564(b)(1) of the Act, 21 U.S.C. section 360bbb-3(b)(1), unless the authorization is terminated or revoked sooner. Performed at [facility], [place] [address], [place], [place] [address]      Labs: CBC: Recent Labs  Lab 03/19/19 0359 03/22/19 0702  WBC 8.6 8.0  NEUTROABS  --  6.0  HGB 12.3* 13.6  HCT 40.1 45.0  MCV 85.5 86.4  PLT 218 536   Basic Metabolic Panel: Recent Labs  Lab 03/19/19 0819 03/22/19 0702  NA 141 141  K 4.4 5.0  CL 106 109  CO2 26 25  GLUCOSE 156* 159*    BUN 39* 27*  CREATININE 1.64* 1.30*  CALCIUM 8.7* 9.1   Liver Function Tests: Recent Labs  Lab 03/22/19 0702  AST 15  ALT 13  ALKPHOS 61  BILITOT 0.5  PROT 6.5  ALBUMIN 2.6*   No results for input(s): LIPASE, AMYLASE in the last 168 hours. No results for input(s): AMMONIA in the last 168 hours. Cardiac Enzymes: No results for input(s): CKTOTAL, CKMB, CKMBINDEX, TROPONINI in the last 168 hours. BNP (last 3 results) No results for input(s): BNP in the last 8760 hours. CBG: Recent Labs  Lab 03/24/19 0633 03/24/19 1152 03/24/19 1639 03/24/19 2125 03/25/19 0603  GLUCAP 152* 145* 251* 159* 117*   Time spent: 35 minutes  Signed:  Berle Mull  [facility] 03/19/2019

## 2019-03-25 NOTE — Progress Notes (Signed)
Physical Therapy Session Note  Patient Details  Name: Travis [name] MRN: [id number] Date of Birth: Oct 11, 1951  Today's Date: 03/25/2019 PT Individual Time: 0867-6195 AND 1140-1210 AND 1700-1730 PT Individual Time Calculation (min): 60 min and 30 min and 30 min   Short Term Goals: Week 1:  PT Short Term Goal 1 (Week 1): STG=LTG due to ELOS  Skilled Therapeutic Interventions/Progress Updates:  Session 1.  Pt received sitting in WC and agreeable to PT. PT instructed pt in gait training to rehab gym with RW x 275f with supervision assist from PT with cues for step width and height. Dynamic gait training with RW to weave through 10 cones x 2 with CGA-supervision assist from PT. Min cues for gait pattern to maintain proper step width. Gait training also performed without AD 2 x 1053fwith min assist overall. Min cues for step width and improved weight shift to prevent R lateral LOB.   Stepping over 1 inch obstacles 2 x 4 with RW and min cues for AD management and step length. Nustep reciprocal movement training with BUE and BLE with cues for improved speed and full ROM with BLE; 8 min , level 6>7. Patient returned to room and [facility] in reach and all needs met.    Session 2.  Pt received sitting EOB and agreeable to PT. Gait training with RW 20038f 2 with supervision assist from PT, mild L LOB x 2 but able ot self correct without additional assist from PT.   Dynamic balance training to complete peg board puzzle, moderate difficulty x 2 while on airex pad and red wedge. CGA-supervision assist from PT for safety. Min cues for awareness of errors, but able to correct once detected. No LOB noted, but required moderate cues for attention to the RLE to prevent supination in the ankle.   Patient returned to room and left sitting in [facility] in reach and all needs met.    Session 3.  Pt received sitting in WC and agreeable to PT. Gait training with RW 2 x [id number] and from  rehab gym with supervision assist from PT. Pt required min cues for step height on the R foot with fatigue, but able to correct with instruction.  Biodex dynamic balance training AP/and lateral weight shift to target. UE assist>no UE. LOS x 2 without UE support. Min assist for improved use of ankle strategy to weight shifting in all directions.  PT spoke with pt's wife on regarding d/c level of supervision assist. Answered questions about follow up therapy, DME, and HEP.   Patient returned to room and left sitting in [facility] bell in reach and all needs met.         Therapy Documentation Precautions:  Precautions Precautions: Fall Precaution Comments: Ataxic, LEs>UEs Restrictions Weight Bearing Restrictions: No  Vital Signs: Therapy Vitals Temp: 98.2 F (36.8 C) Temp Source: Oral Pulse Rate: 75 Resp: 19 BP: (!) 145/64 Patient Position (if appropriate): Lying Oxygen Therapy SpO2: 93 % O2 Device: Room Air Pain: denies   Therapy/Group: Individual Therapy  AustLorie Phenix4/2020, 9:35 AM

## 2019-03-25 NOTE — Progress Notes (Signed)
Bilateral lower extremity venous duplex completed. Results in Chart review CV Proc. 03/25/2019, 3:01 PM

## 2019-03-25 NOTE — Progress Notes (Signed)
Speech Language Pathology Daily Session Note  Patient Details  Name: Travis [name] MRN: [id number] Date of Birth: 1950-12-27  Today's Date: 03/25/2019 SLP Individual Time: 1445-1530 SLP Individual Time Calculation (min): 45 min  Short Term Goals: Week 1: SLP Short Term Goal 1 (Week 1): Given supervision cues, pt wil recall speech intelligibility strategies in 8 out of 10 opportunities.  SLP Short Term Goal 2 (Week 1): Given Min A cues, pt will utilize speech intelligibility strategies at the sentence level to achieve ~ 90% intelligibility.   Skilled Therapeutic Interventions: Pt was seen for skilled ST intervention targeting goals for improved speech intelligibility. SLP facilitated session by engaging pt in spontaneous conversation about various topics. Pt independently recalled strategies for intelligibility. SLP provided education regarding additional strategy of "plate glass" communication, encouraging slow rate and overarticulation of lips and tongue as if listener had to rely on lipreading. Pt was ~90% intelligible during conversation. Repetition requested only 2x. Context was sufficient to determine unintelligible phrases x2.  Pt was left in chair with alarm on, all needs within reach. Continue ST per current plan of care.  Pain Pain Assessment Pain Scale: 0-10 Pain Score: 0-No pain  Therapy/Group: Individual Therapy  Aedyn Mckeon B. Quentin Ore, [facility], [place] Speech Language Pathologist  Shonna Chock 03/25/2019, 3:42 PM

## 2019-03-25 NOTE — Discharge Instructions (Signed)
Inpatient Rehab Discharge Instructions  Travis [name] Discharge date and time: No discharge date for patient encounter.   Activities/Precautions/ Functional Status: Activity: activity as tolerated Diet: diabetic diet Wound Care: none needed Functional status:  ___ No restrictions     ___ Walk up steps independently ___ 24/7 supervision/assistance   ___ Walk up steps with assistance ___ Intermittent supervision/assistance  ___ Bathe/dress independently ___ Walk with walker     _x__ Bathe/dress with assistance ___ Walk Independently    ___ Shower independently ___ Walk with assistance    ___ Shower with assistance ___ No alcohol     ___ Return to work/school ________  Special Instructions:  No driving smoking or alcohol   COMMUNITY REFERRALS UPON DISCHARGE:    Home Health:   PT, OT, RN     Agency:[facility]   Phone:[phone]   Date of last service:03/27/2019  Medical Equipment/Items [place]  Agency/Supplier:[facility]  [phone]   GENERAL COMMUNITY RESOURCES FOR PATIENT/FAMILY: Support Groups:[facility] SUPPORT GROUP THE SECOND Thursday OF THE MONTH ( SEPT-MAY ) AT 6:00-7:00 PM QUESTIONS AMY [phone]   STROKE/TIA DISCHARGE INSTRUCTIONS SMOKING Cigarette smoking nearly doubles your risk of having a stroke & is the single most alterable risk factor  If you smoke or have smoked in the last 12 months, you are advised to quit smoking for your health.  Most of the excess cardiovascular risk related to smoking disappears within a year of stopping.  Ask you doctor about anti-smoking medications  [place] Quit Line: 1-800-QUIT NOW  Free Smoking Cessation Classes [phone]  CHOLESTEROL Know your levels; limit fat & cholesterol in your diet  Lipid Panel     Component Value Date/Time   CHOL 90 03/16/2019 0617   TRIG 95 03/16/2019 0617   HDL 25 (L) 03/16/2019 0617   CHOLHDL 3.6 03/16/2019 0617   VLDL 19 03/16/2019 0617   LDLCALC 46 03/16/2019  0617      Many patients benefit from treatment even if their cholesterol is at goal.  Goal: Total Cholesterol (CHOL) less than 160  Goal:  Triglycerides (TRIG) less than 150  Goal:  HDL greater than 40  Goal:  LDL (LDLCALC) less than 100   BLOOD PRESSURE American Stroke Association blood pressure target is less that 120/80 mm/Hg  Your discharge blood pressure is:  BP: 121/74  Monitor your blood pressure  Limit your salt and alcohol intake  Many individuals will require more than one medication for high blood pressure  DIABETES (A1c is a blood sugar average for last 3 months) Goal HGBA1c is under 7% (HBGA1c is blood sugar average for last 3 months)  Diabetes:    Lab Results  Component Value Date   HGBA1C 8.1 (H) 03/16/2019     Your HGBA1c can be lowered with medications, healthy diet, and exercise.  Check your blood sugar as directed by your physician  Call your physician if you experience unexplained or low blood sugars.  PHYSICAL ACTIVITY/REHABILITATION Goal is 30 minutes at least 4 days per week  Activity: Increase activity slowly, Therapies: Physical Therapy: Home Health Return to work:   Activity decreases your risk of heart attack and stroke and makes your heart stronger.  It helps control your weight and blood pressure; helps you relax and can improve your mood.  Participate in a regular exercise program.  Talk with your doctor about the best form of exercise for you (dancing, walking, swimming, cycling).  DIET/WEIGHT Goal is to maintain a healthy  weight  Your discharge diet is:  Diet Order            Diet Carb Modified Fluid consistency: Thin; Room service appropriate? Yes  Diet effective now              liquids Your height is:    Your current weight is: Weight: 93.1 kg Your Body Mass Index (BMI) is:  BMI (Calculated): 27.83  Following the type of diet specifically designed for you will help prevent another stroke.  Your goal weight range is:     Your goal Body Mass Index (BMI) is 19-24.  Healthy food habits can help reduce 3 risk factors for stroke:  High cholesterol, hypertension, and excess weight.  RESOURCES Stroke/Support Group:  Call [phone]   STROKE EDUCATION PROVIDED/REVIEWED AND GIVEN TO PATIENT Stroke warning signs and symptoms How to activate emergency medical system (call 911). Medications prescribed at discharge. Need for follow-up after discharge. Personal risk factors for stroke. Pneumonia vaccine given:  Flu vaccine given:  My questions have been answered, the writing is legible, and I understand these instructions.  I will adhere to these goals & educational materials that have been provided to me after my discharge from the hospital.     My questions have been answered and I understand these instructions. I will adhere to these goals and the provided educational materials after my discharge from the hospital.  Patient/Caregiver Signature _______________________________ Date __________  Clinician Signature _______________________________________ Date __________  Please bring this form and your medication list with you to all your follow-up doctor's appointments.

## 2019-03-25 NOTE — Plan of Care (Signed)
  Problem: Consults Goal: RH STROKE PATIENT EDUCATION Description See Patient Education module for education specifics  Outcome: Progressing Goal: Nutrition Consult-if indicated Outcome: Progressing Goal: Diabetes Guidelines if Diabetic/Glucose > 140 Description If diabetic or lab glucose is > 140 mg/dl - Initiate Diabetes/Hyperglycemia Guidelines & Document Interventions  Outcome: Progressing   Problem: RH BOWEL ELIMINATION Goal: RH STG MANAGE BOWEL WITH ASSISTANCE Description STG Manage Bowel with min Assistance.  Outcome: Progressing Goal: RH STG MANAGE BOWEL W/MEDICATION W/ASSISTANCE Description STG Manage Bowel with Medication with min Assistance.  Outcome: Progressing   Problem: RH BLADDER ELIMINATION Goal: RH STG MANAGE BLADDER WITH ASSISTANCE Description STG Manage Bladder With supervision.  Outcome: Progressing   Problem: RH SKIN INTEGRITY Goal: RH STG SKIN FREE OF INFECTION/BREAKDOWN Outcome: Progressing Goal: RH STG MAINTAIN SKIN INTEGRITY WITH ASSISTANCE Description STG Maintain Skin Integrity With min Assistance.  Outcome: Progressing   Problem: RH SAFETY Goal: RH STG ADHERE TO SAFETY PRECAUTIONS W/ASSISTANCE/DEVICE Description STG Adhere to Safety Precautions With min Assistance/Device.  Outcome: Progressing Goal: RH STG DECREASED RISK OF FALL WITH ASSISTANCE Description STG Decreased Risk of Fall With min Assistance.  Outcome: Progressing   Problem: RH COGNITION-NURSING Goal: RH STG USES MEMORY AIDS/STRATEGIES W/ASSIST TO PROBLEM SOLVE Description STG Uses Memory Aids/Strategies With supervision to Problem Solve.  Outcome: Progressing Goal: RH STG ANTICIPATES NEEDS/CALLS FOR ASSIST W/ASSIST/CUES Description STG Anticipates Needs/Calls for Assist With supervision.  Outcome: Progressing   Problem: RH PAIN MANAGEMENT Goal: RH STG PAIN MANAGED AT OR BELOW PT'S PAIN GOAL Description Pain less than or equal to 2.  Outcome: Progressing   Problem:  RH KNOWLEDGE DEFICIT Goal: RH STG INCREASE KNOWLEDGE OF DIABETES Description Patient will be able to verbalize management of T2DM including target CBG and medication/diet management with cues/handouts  Outcome: Progressing Goal: RH STG INCREASE KNOWLEDGE OF HYPERTENSION Description Patient will be able to describe management of hypertension with cues/handouts  Outcome: Progressing Goal: RH STG INCREASE KNOWLEDGE OF STROKE PROPHYLAXIS Description Patient will be able to describe measures to prevent stroke including diet/exercise and medication with cues/handouts  Outcome: Progressing

## 2019-03-26 ENCOUNTER — Inpatient Hospital Stay (HOSPITAL_COMMUNITY): Payer: Medicare Other | Admitting: Occupational Therapy

## 2019-03-26 ENCOUNTER — Inpatient Hospital Stay (HOSPITAL_COMMUNITY): Payer: Medicare Other | Admitting: Speech Pathology

## 2019-03-26 ENCOUNTER — Inpatient Hospital Stay (HOSPITAL_COMMUNITY): Payer: Medicare Other | Admitting: Physical Therapy

## 2019-03-26 LAB — DRVVT CONFIRM: dRVVT Confirm: 1.1 ratio (ref 0.8–1.2)

## 2019-03-26 LAB — LUPUS ANTICOAGULANT PANEL
DRVVT: 115.9 s — ABNORMAL HIGH (ref 0.0–47.0)
PTT Lupus Anticoagulant: 42.2 s (ref 0.0–51.9)

## 2019-03-26 LAB — GLUCOSE, CAPILLARY
Glucose-Capillary: 118 mg/dL — ABNORMAL HIGH (ref 70–99)
Glucose-Capillary: 159 mg/dL — ABNORMAL HIGH (ref 70–99)
Glucose-Capillary: 130 mg/dL — ABNORMAL HIGH (ref 70–99)
Glucose-Capillary: 137 mg/dL — ABNORMAL HIGH (ref 70–99)

## 2019-03-26 LAB — DRVVT MIX: dRVVT Mix: 69 s — ABNORMAL HIGH (ref 0.0–47.0)

## 2019-03-26 MED ORDER — BENZONATATE 100 MG PO CAPS: 100.0000 mg | ORAL_CAPSULE | Freq: Three times a day (TID) | ORAL | 0 refills | Status: AC | PRN

## 2019-03-26 MED ORDER — APIXABAN 5 MG PO TABS: 5.0000 mg | ORAL_TABLET | Freq: Two times a day (BID) | ORAL | 1 refills | Status: AC

## 2019-03-26 MED ORDER — BENAZEPRIL HCL 40 MG PO TABS: 40.0000 mg | ORAL_TABLET | Freq: Every day | ORAL | 0 refills | Status: AC

## 2019-03-26 MED ORDER — ALLOPURINOL 100 MG PO TABS: 100.0000 mg | ORAL_TABLET | Freq: Every day | ORAL | 0 refills | Status: AC

## 2019-03-26 MED ORDER — DEXTROMETHORPHAN POLISTIREX ER 30 MG/5ML PO SUER: 15.0000 mg | Freq: Two times a day (BID) | ORAL | 0 refills | Status: AC

## 2019-03-26 MED ORDER — MIRTAZAPINE 15 MG PO TABS: 15.0000 mg | ORAL_TABLET | Freq: Every day | ORAL | 0 refills | Status: AC

## 2019-03-26 MED ORDER — ALBUTEROL SULFATE HFA 108 (90 BASE) MCG/ACT IN AERS: 2.0000 | INHALATION_SPRAY | Freq: Four times a day (QID) | RESPIRATORY_TRACT | 0 refills | Status: AC | PRN

## 2019-03-26 MED ORDER — CALCITRIOL 0.25 MCG PO CAPS: 0.2500 ug | ORAL_CAPSULE | Freq: Every day | ORAL | 6 refills | Status: AC

## 2019-03-26 MED ORDER — METOPROLOL TARTRATE 100 MG PO TABS: 100.0000 mg | ORAL_TABLET | Freq: Two times a day (BID) | ORAL | 0 refills | Status: AC

## 2019-03-26 MED ORDER — INSULIN GLARGINE 100 UNITS/ML SOLOSTAR PEN: 5.0000 [IU] | PEN_INJECTOR | Freq: Every day | SUBCUTANEOUS | 11 refills | Status: DC

## 2019-03-26 MED ORDER — INSULIN GLARGINE 100 UNIT/ML SOLOSTAR PEN: 5.0000 [IU] | PEN_INJECTOR | Freq: Every day | SUBCUTANEOUS | 11 refills | Status: DC

## 2019-03-26 MED ORDER — AMLODIPINE BESYLATE 10 MG PO TABS: 10.0000 mg | ORAL_TABLET | Freq: Every day | ORAL | 0 refills | Status: AC

## 2019-03-26 NOTE — Progress Notes (Signed)
Physical Therapy Discharge Summary  Patient Details  Name: Travis [name] MRN: [id number] Date of Birth: 1951/04/16  Today's Date: 03/26/2019 PT Individual Time: 0800-0830 AND 1420-1500 PT Individual Time Calculation (min): 30 min and 40 min    Patient has met 11 of 11 long term goals due to improved activity tolerance, improved balance, improved postural control, increased strength, ability to compensate for deficits, functional use of  right upper extremity and right lower extremity and improved coordination.  Patient to discharge at an ambulatory level Supervision.   Patient's care partner is independent to provide the necessary physical assistance at discharge.  Reasons goals not met: All PT goals met   Recommendation:  Patient will benefit from ongoing skilled PT services in home health setting to continue to advance safe functional mobility, address ongoing impairments in balance, strength, coordination, transfers, gait, and minimize fall risk.  Equipment: RW  Reasons for discharge: treatment goals met and discharge from hospital  Patient/family agrees with progress made and goals achieved: Yes   PT treatment:  Session 1  Pt received supine in bed and agreeable to PT. Supine>sit transfer without assist or cues. Pt donned shoes sitting EOB without assist from PT. PT instructed pt in Grad day assessment to measure progress toward goals. See below for details. PT instructed in gait training to and from room with supervision assist from PT and RW, stair negotiation training with 1-2 UE support x 12 with supervision assist. Patient returned to room and left sitting in [facility] with call bell in reach and all needs met.    Session 2.  Pt received sitting in WC and agreeable to PT. Gait training with RW x [id number] and [id number] supervisoin assist and min cues for AD management in turns. PT instructed pt in [place] hand out provided. Cues for proper ROM, decreased compensations, and  use of UE support. Car transfer training to hiE. [employer]. [employer]ith supervision assist and RW. Patient returned to room and [facility] in reach and all needs met.         PT Discharge Precautions/Restrictions   fall.  Vital Signs Therapy Vitals Pulse Rate: 75 BP: (!) 142/57 Oxygen Therapy SpO2: 90 % Pain   0/10  Vision/Perception     WFL Cognition Overall Cognitive Status: Within Functional Limits for tasks assessed Arousal/Alertness: Awake/alert Orientation Level: Oriented X4 Sensation Sensation Light Touch: Appears Intact Proprioception: Impaired by gross assessment Coordination Gross Motor Movements are Fluid and Coordinated: No Fine Motor Movements are Fluid and Coordinated: No Coordination and Movement Description: dysmetric RUE and RLE Finger Nose Finger Test: decreased speed and mild dysmetria on the R  Heel Shin Test: mild dysmetria RLE Motor  Motor Motor: Ataxia Motor - Discharge Observations: mild R sided ataxia   Mobility Bed Mobility Bed Mobility: Rolling Right;Rolling Left;Supine to Sit;Sit to Supine Rolling Right: Independent with assistive device Rolling Left: Independent with assistive device Supine to Sit: Supervision/Verbal cueing Sit to Supine: Independent with assistive device Sit to Sidelying Right: Independent with assistive device Transfers Transfers: Sit to Stand;Stand to Sit;Stand Pivot Transfers Sit to Stand: Supervision/Verbal cueing Stand to Sit: Supervision/Verbal cueing Stand Pivot Transfers: Supervision/Verbal cueing Locomotion  Gait Ambulation: Yes Gait Assistance: Supervision/Verbal cueing Gait Distance (Feet): 200 Feet Assistive device: Rolling walker Gait Gait: Yes Gait Pattern: Impaired Gait Pattern: Ataxic;Poor foot clearance - right Stairs / Additional Locomotion Stairs: Yes Stairs Assistance: Supervision/Verbal cueing Stair Management Technique: One rail Left Number of Stairs:  12 Wheelchair  Mobility Wheelchair Mobility: No  Trunk/Postural Assessment  Cervical Assessment Cervical Assessment: Exceptions to WFL(forward head) Thoracic Assessment Thoracic Assessment: Exceptions to WFL(rounded shoulders) Lumbar Assessment Lumbar Assessment: Exceptions to WFL(posterior pelvic tilt) Postural Control Postural Control: Within Functional Limits  Balance Dynamic Sitting Balance Dynamic Sitting - Level of Assistance: 7: Independent Static Standing Balance Static Standing - Level of Assistance: 5: Stand by assistance Dynamic Standing Balance Dynamic Standing - Level of Assistance: 5: Stand by assistance Extremity Assessment      RLE Assessment RLE Assessment: Exceptions to [facility] General Strength Comments: knee extension 5/5 all others grossly 4+/5   LLE Assessment LLE Assessment: Exceptions to [facility] General Strength Comments: grossly 5/5 proximal to distal except hip flexion 4+/5.     Lorie Phenix 03/26/2019, 8:50 AM

## 2019-03-26 NOTE — Plan of Care (Signed)
  Problem: Consults Goal: RH STROKE PATIENT EDUCATION Description See Patient Education module for education specifics  Outcome: Progressing Goal: Nutrition Consult-if indicated Outcome: Progressing Goal: Diabetes Guidelines if Diabetic/Glucose > 140 Description If diabetic or lab glucose is > 140 mg/dl - Initiate Diabetes/Hyperglycemia Guidelines & Document Interventions  Outcome: Progressing   Problem: RH BOWEL ELIMINATION Goal: RH STG MANAGE BOWEL WITH ASSISTANCE Description STG Manage Bowel with min Assistance.  Outcome: Progressing Goal: RH STG MANAGE BOWEL W/MEDICATION W/ASSISTANCE Description STG Manage Bowel with Medication with min Assistance.  Outcome: Progressing   Problem: RH BLADDER ELIMINATION Goal: RH STG MANAGE BLADDER WITH ASSISTANCE Description STG Manage Bladder With supervision.  Outcome: Progressing   Problem: RH SKIN INTEGRITY Goal: RH STG SKIN FREE OF INFECTION/BREAKDOWN Outcome: Progressing Goal: RH STG MAINTAIN SKIN INTEGRITY WITH ASSISTANCE Description STG Maintain Skin Integrity With min Assistance.  Outcome: Progressing   Problem: RH SAFETY Goal: RH STG ADHERE TO SAFETY PRECAUTIONS W/ASSISTANCE/DEVICE Description STG Adhere to Safety Precautions With min Assistance/Device.  Outcome: Progressing Goal: RH STG DECREASED RISK OF FALL WITH ASSISTANCE Description STG Decreased Risk of Fall With min Assistance.  Outcome: Progressing   Problem: RH COGNITION-NURSING Goal: RH STG USES MEMORY AIDS/STRATEGIES W/ASSIST TO PROBLEM SOLVE Description STG Uses Memory Aids/Strategies With supervision to Problem Solve.  Outcome: Progressing Goal: RH STG ANTICIPATES NEEDS/CALLS FOR ASSIST W/ASSIST/CUES Description STG Anticipates Needs/Calls for Assist With supervision.  Outcome: Progressing   Problem: RH PAIN MANAGEMENT Goal: RH STG PAIN MANAGED AT OR BELOW PT'S PAIN GOAL Description Pain less than or equal to 2.  Outcome: Progressing   Problem:  RH KNOWLEDGE DEFICIT Goal: RH STG INCREASE KNOWLEDGE OF DIABETES Description Patient will be able to verbalize management of T2DM including target CBG and medication/diet management with cues/handouts  Outcome: Progressing Goal: RH STG INCREASE KNOWLEDGE OF HYPERTENSION Description Patient will be able to describe management of hypertension with cues/handouts  Outcome: Progressing Goal: RH STG INCREASE KNOWLEDGE OF STROKE PROPHYLAXIS Description Patient will be able to describe measures to prevent stroke including diet/exercise and medication with cues/handouts  Outcome: Progressing

## 2019-03-26 NOTE — Progress Notes (Signed)
Speech Language Pathology Discharge Summary  Patient Details  Name: Travis [name] MRN: [id number] Date of Birth: January 18, 1951  Today's Date: 03/26/2019 SLP Individual Time: 4932-4199 SLP Individual Time Calculation (min): 45 min   Skilled Therapeutic Interventions:  Skilled treatment session focused on speech intelligibility goals and completing education. SLP facilitated session by providing Mod I for use of speech intelligibility strategies (specifically slow rate and over-articulation). Pt implemented with diligence to achieve ~ 100% intelligibility at the conversation level. To promote conversation, SLP had pt describe what safety precautions PT had recommended. SLP further promoted safety by discussing the risks of furniture walking. Pt was agreeable to walking with walker throughout his house.     Patient has met 1 of 1 long term goals.  Patient to discharge at overall Supervision level.    Clinical Impression/Discharge Summary:   Pt has made great progress in skilled ST sessions and as such is speech in ~ 100% intelligible with Mod I use of speech intelligibility strategies.   Care Partner:  Caregiver Able to Provide Assistance: Yes  Type of Caregiver Assistance: Physical;Cognitive  Recommendation:  Home Health SLP  Rationale for SLP Follow Up: Reduce caregiver burden;Maximize cognitive function and independence     Reasons for discharge: Discharged from hospital   Patient/Family Agrees with Progress Made and Goals Achieved: Yes    [name] 03/26/2019, 2:27 PM

## 2019-03-26 NOTE — Progress Notes (Signed)
Social Work  Discharge Note  The overall goal for the admission was met for: DC SAT 5/16  Discharge location: Yes-HOME WITH WIFE WHO CAN PROVIDE 24 HR SUPERVISION  Length of Stay: Yes-8 DAYS  Discharge activity level: Yes-SUPERVISION LEVEL  Home/community participation: Yes  Services provided included: MD, RD, PT, OT, SLP, RN, CM, Pharmacy and SW  Financial Services: Medicare and Private Insurance: [place]  Follow-up services arranged: Home Health: [facility]-PT,, OT, RN, DME: [facility] and Patient/Family has no preference for HH/DME agencies  Comments (or additional information):TEAM TALKED WITH WIFE VIA TELEPHONE TO ANSWER HER QUESTIONS AND FELT IN PERSON FAMILY EDUCATION NOT NECESSARY. PT DI WELL PHYSICALLY BUT HAD MEDICAL ISSUES.  Patient/Family verbalized understanding of follow-up arrangements: Yes  Individual responsible for coordination of the follow-up plan: SELF & TINA-WIFE  Confirmed correct DME delivered: Elease Hashimoto 03/26/2019    Elease Hashimoto

## 2019-03-26 NOTE — Progress Notes (Signed)
Occupational Therapy Discharge Summary  Patient Details  Name: Travis [name] MRN: [id number] Date of Birth: 11-17-1950  Today's Date: 03/26/2019 OT Individual Time: 1300-1355 OT Individual Time Calculation (min): 55 min    Patient has met 9 of 9 long term goals due to improved activity tolerance, improved balance, functional use of  RIGHT upper extremity and improved coordination.  Patient to discharge at overall Modified Independent level.  Patient's care partner is independent to provide the necessary physical assistance at discharge.    Reasons goals not met: na  Recommendation:  Patient will benefit from ongoing skilled OT services in home health setting to continue to advance functional skills in the area of BADL.  Equipment: shower seat with back  Reasons for discharge: treatment goals met  Patient/family agrees with progress made and goals achieved: Yes  OT Discharge Precautions/Restrictions  Precautions Precautions: Fall General  Patient seated in recliner and ready for therapy session today.  Patient able to ambulate in room with RW to gather supplies to completed shower and dressing as documented below.  He ambulated to/from therapy gym mod I with RW, completed Wheeler and standing balance and coordination tasks with good safety awareness and compensation for ongoing balance deficits.  Reviewed discharge plan, home exercise program, DME, and safety with functional task completion.  Patient states that he feels prepared and is eager for discharge home tomorrow.  Patient returned to room, seated in recliner with items in reach.   Vital Signs Therapy Vitals Temp: 98.3 F (36.8 C) Temp Source: Oral Pulse Rate: 71 Resp: 19 BP: (!) 138/56 Patient Position (if appropriate): Sitting Oxygen Therapy SpO2: 96 % O2 Device: Room Air Pain Pain Assessment Pain Scale: 0-10 Pain Score: 0-No pain ADL ADL Eating: Independent Where Assessed-Eating: Chair Grooming: Modified  independent Where Assessed-Grooming: Standing at sink Upper Body Bathing: Independent Where Assessed-Upper Body Bathing: Shower Lower Body Bathing: Modified independent Where Assessed-Lower Body Bathing: Shower Upper Body Dressing: Independent Where Assessed-Upper Body Dressing: Chair Lower Body Dressing: Modified independent Where Assessed-Lower Body Dressing: Chair Toileting: Independent Where Assessed-Toileting: Glass blower/designer: Diplomatic Services operational officer Method: Counselling psychologist: Energy manager: Not assessed Social research officer, government: Modified independent Social research officer, government Method: Heritage manager: Civil engineer, contracting with back Vision Baseline Vision/History: Wears glasses Patient Visual Report: No change from baseline Vision Assessment?: No apparent visual deficits Perception  Perception: Within Functional Limits   Cognition Overall Cognitive Status: Within Functional Limits for tasks assessed Arousal/Alertness: Awake/alert Orientation Level: Oriented X4 Sensation Sensation Light Touch: Appears Intact Proprioception: Impaired by gross assessment Coordination 9 Hole Peg Test: R = 43 sec, L = 29 sec Motor  Motor Motor - Discharge Observations: mild R sided ataxia  Mobility  Transfers Sit to Stand: Independent with assistive device Stand to Sit: Independent with assistive device    Extremity/Trunk Assessment RUE Assessment RUE Assessment: Within Functional Limits General Strength Comments: 4+/5 proximal, 5/5 distal LUE Assessment LUE Assessment: Within Functional Limits Active Range of Motion (AROM) Comments: WNL General Strength Comments: 4+/5 proximal, 5/5 distal   Analyah Mcconnon A Jasmyne Lodato 03/26/2019, 4:10 PM

## 2019-03-27 LAB — GLUCOSE, CAPILLARY: Glucose-Capillary: 112 mg/dL — ABNORMAL HIGH (ref 70–99)

## 2019-03-27 NOTE — Progress Notes (Signed)
Travis [name] is a 68 y.o. male who is admitted for CIR with functional and mobility deficits secondary to a prior right cerebellar infarct;  doing quite well.   Feels that he is ready for anticipated discharge today  Medical issues include prior left DVT chronic kidney disease coronary artery disease as well as type 2 diabetes.  Subjective: No new complaints. No new problems. Slept well. Feeling OK and feels that he is ready for discharge.  Uses a walker following cerebellar stroke  Objective: Vital signs in last 24 hours: Temp:  [98.3 F (36.8 C)-98.6 F (37 C)] 98.6 F (37 C) (05/16 0437) Pulse Rate:  [69-74] 69 (05/16 0437) Resp:  [16-19] 17 (05/16 0437) BP: (138-140)/(48-56) 140/50 (05/16 0437) SpO2:  [90 %-96 %] 90 % (05/16 0437) Weight:  [93.5 kg] 93.5 kg (05/16 0437) Weight change: 0.1 kg Last BM Date: 03/26/19  Intake/Output from previous day: 05/15 0701 - 05/16 0700 In: 684 [P.O.:684] Out: 1000 [Urine:1000] Last cbgs: CBG (last 3)  Recent Labs    03/26/19 1631 03/26/19 2114 03/27/19 0635  GLUCAP 130* 137* 112*     Physical Exam General: No apparent distress   HEENT: not dry Lungs: Normal effort. Lungs clear to auscultation, no crackles or wheezes. Cardiovascular: Regular rate and rhythm, no edema Abdomen: S/NT/ND; BS(+) Musculoskeletal:  unchanged Neurological: No new neurological deficits; unsteady gait with impaired finger-to-nose testing Wounds: N/A    Skin: clear   Mental state: Alert, oriented, cooperative    Lab Results: BMET    Component Value Date/Time   NA 141 03/22/2019 0702   K 5.0 03/22/2019 0702   CL 109 03/22/2019 0702   CO2 25 03/22/2019 0702   GLUCOSE 159 (H) 03/22/2019 0702   BUN 27 (H) 03/22/2019 0702   CREATININE 1.30 (H) 03/22/2019 0702   CREATININE 2.20 (H) 03/29/2015 1130   CALCIUM 9.1 03/22/2019 0702   GFRNONAA 56 (L) 03/22/2019 0702   GFRAA >60 03/22/2019 0702   CBC    Component Value Date/Time   WBC 8.0 03/22/2019  0702   RBC 5.21 03/22/2019 0702   HGB 13.6 03/22/2019 0702   HCT 45.0 03/22/2019 0702   PLT 275 03/22/2019 0702   MCV 86.4 03/22/2019 0702   MCH 26.1 03/22/2019 0702   MCHC 30.2 03/22/2019 0702   RDW 14.0 03/22/2019 0702   LYMPHSABS 0.9 03/22/2019 0702   MONOABS 0.7 03/22/2019 0702   EOSABS 0.4 03/22/2019 0702   BASOSABS 0.1 03/22/2019 4696    Studies/Results: Vas [place] Lower Extremity Venous (dvt)  Result Date: 03/25/2019  Lower Venous Study Indications: Assess fr new DVT sine 03/17/2019.  Performing Technologist: Toma Copier RVS  Examination Guidelines: A complete evaluation includes B-mode imaging, spectral Doppler, color Doppler, and power Doppler as needed of all accessible portions of each vessel. Bilateral testing is considered an integral part of a complete examination. Limited examinations for reoccurring indications may be performed as noted.  +---------+---------------+---------+-----------+----------+-------+ RIGHT    CompressibilityPhasicitySpontaneityPropertiesSummary +---------+---------------+---------+-----------+----------+-------+ CFV      Full           Yes      Yes                          +---------+---------------+---------+-----------+----------+-------+ SFJ      Full                                                 +---------+---------------+---------+-----------+----------+-------+  FV Prox  Full           Yes      Yes                          +---------+---------------+---------+-----------+----------+-------+ FV Mid   Full                                                 +---------+---------------+---------+-----------+----------+-------+ FV DistalFull           Yes      Yes                          +---------+---------------+---------+-----------+----------+-------+ PFV      Full           Yes      Yes                          +---------+---------------+---------+-----------+----------+-------+ POP      Full            Yes      Yes                          +---------+---------------+---------+-----------+----------+-------+ PTV      Full                                                 +---------+---------------+---------+-----------+----------+-------+ PERO     Full                                                 +---------+---------------+---------+-----------+----------+-------+   +---------+---------------+---------+-----------+----------+------------------+ LEFT     CompressibilityPhasicitySpontaneityPropertiesSummary            +---------+---------------+---------+-----------+----------+------------------+ CFV      Full           Yes      Yes                                     +---------+---------------+---------+-----------+----------+------------------+ SFJ      Full                                                            +---------+---------------+---------+-----------+----------+------------------+ FV Prox  Full           Yes      Yes                                     +---------+---------------+---------+-----------+----------+------------------+ FV Mid   Full                                                            +---------+---------------+---------+-----------+----------+------------------+  FV DistalFull           Yes      Yes                                     +---------+---------------+---------+-----------+----------+------------------+ PFV      Full           Yes      Yes                                     +---------+---------------+---------+-----------+----------+------------------+ POP      Full           Yes      Yes                                     +---------+---------------+---------+-----------+----------+------------------+ PTV      Partial                                                         +---------+---------------+---------+-----------+----------+------------------+ PERO     Full                                          Difficult to image +---------+---------------+---------+-----------+----------+------------------+ There is a partial DVT noted in the mid calf . There is no propagation noted however there appears to be some resolution of the DVT noted 03/17/2019    Summary: Right: There is no evidence of deep vein thrombosis in the lower extremity. No cystic structure found in the popliteal fossa. Left: There is no evidence of deep vein thrombosis in the lower extremity. See technical notes listed above. No cystic structure found in the popliteal fossa.  *See table(s) above for measurements and observations. Electronically signed by Monica Martinez MD on 03/25/2019 at 4:27:15 PM.    Final     Medications: I have reviewed the patient's current medications.  Assessment/Plan:  Status post cerebellar stroke.  Stable for discharge Type 2 diabetes stable Chronic kidney disease History of left DVT.  Continue Eliquis CAD stable    Length of stay, days: 8  Marletta Lor , MD 03/27/2019, 8:27 AM

## 2019-03-27 NOTE — Plan of Care (Signed)
Patient discharged this shift; Goals are met and patient  and family educated by staff.

## 2019-03-27 NOTE — Progress Notes (Signed)
Patient discharged via w/c by staff; denies pain at this time. Voices understanding of discharge instructions

## 2019-03-29 LAB — FACTOR 5 LEIDEN

## 2019-03-29 NOTE — Discharge Summary (Signed)
Physician Discharge Summary  Patient ID: Travis [name] MRN: [id number] DOB/AGE: 19-Aug-1951 68 y.o.  Admit date: 03/19/2019 Discharge date: 03/27/2019  Discharge Diagnoses:  Principal Problem:   Cerebellar stroke [facility]) Active Problems:   CKD (chronic kidney disease) stage 3, GFR 30-59 ml/min (HCC)   Squamous cell carcinoma of lungs, bilateral (HCC)   Hematemesis   HTN (hypertension)   DVT, lower extremity, distal (HCC)   Discharged Condition: stable   Significant Diagnostic Studies:  Ct Abdomen Pelvis W Contrast  Result Date: 03/17/2019 CLINICAL DATA:  Squamous cell carcinoma the lungs. Right cerebellar stroke. EXAM: CT CHEST, ABDOMEN, AND PELVIS WITH CONTRAST TECHNIQUE: Multidetector CT imaging of the chest, abdomen and pelvis was performed following the standard protocol during bolus administration of intravenous contrast. CONTRAST:  70m OMNIPAQUE IOHEXOL 300 MG/ML  SOLN COMPARISON:  Chest CT from 06/04/2018 and PET-CT from 08/15/2017 FINDINGS: CT CHEST FINDINGS Cardiovascular: Coronary, aortic arch, and branch vessel atherosclerotic vascular disease. Mild cardiomegaly. Interventricular septal thickening suggesting left ventricular hypertrophy. Mediastinum/Nodes: Right paratracheal node 1.0 cm in short axis on image 19/3, formerly 0.6 cm. Additional small right paratracheal lymph nodes are below 1 cm in diameter but mildly increased from prior exam. Subcarinal node 1.1 cm in short axis on image 30/3, formerly 0.5 cm. Bilateral gynecomastia. Lungs/Pleura: Small right pleural effusion. There is complete occlusion of the right upper lobe bronchus. There is atelectasis and possibly some mild alveolar filling airspace opacity posteriorly in the right upper lobe, with aerated anterior portion of the right upper lobe. Airway thickening along the bronchus intermedius resulting in some luminal narrowing, with airway thickening centrally in the right middle lobe and right lower lobe. Superior segment right  lower lobe airspace opacity on image 67/5 could be from prior radiation therapy though infection is not excluded. Fiducials are noted in the superior segment right lower lobe. Further inferiorly in the vicinity of the prior pulmonary nodule, there is indistinct patchy opacity which could be from radiation pneumonitis or pneumonia, for example as shown on image 93/5. The original nodule is not readily apparent against this background density. There is some associated surrounding ground-glass opacity. There is some faint nodularity peripherally in the left upper lobe. On image 62/5, a triangular 0.4 by 0.3 cm nodule is new or significantly increased from prior. Indistinct ground-glass density nodule in the left upper lobe measuring 0.9 cm in diameter on image 78/5 is new. Subsegmental atelectasis is present anteriorly in left lower lobe. Small fiducials are present anteriorly in the left lower lobe and there are bandlike opacities along the left hemidiaphragm with crowding of bronchovascular structures suggesting some degree of volume loss. The lobulated nodule along the inferior pulmonary ligament is somewhat obscured by surrounding atelectasis, for example on image 114/5, but is less sharply defined and possibly less prominent. There is some additional nodularity in this vicinity, for example posteriorly on image 112/5 where there is a 1.7 by 1.3 cm ill-defined nodular density, probably inflammatory. Musculoskeletal: Thoracic spondylosis. CT ABDOMEN PELVIS FINDINGS Hepatobiliary: Cholecystectomy. The common bile duct measures 1.1 cm in diameter, not changed from 08/15/2017, probably a physiologic response to cholecystectomy. No focal liver lesion is identified. Pancreas: Unremarkable Spleen: Unremarkable Adrenals/Urinary Tract: 1.7 by 1.8 cm left adrenal mass is unchanged in size from 08/15/2017 and was not previously hypermetabolic, hence likely benign. Small hypodense lesions of the left mid kidney right kidney  upper pole probably cysts but technically too small to characterize. Considerable vascular calcifications along the renal hila bilaterally. No definite urinary  tract calculi. Stomach/Bowel: Unremarkable Vascular/Lymphatic: Aortoiliac atherosclerotic vascular disease. No pathologic adenopathy identified. Reproductive: Faint calcification in the right posterior prostate gland. Other: No supplemental non-categorized findings. Musculoskeletal: Incidental lipoma in the right vastus lateralis muscle. Degenerative disc disease at L4-5. IMPRESSION: 1. Both the right lower lobe pulmonary nodule on the left lower lobe pulmonary nodule are reduced in conspicuity with some degree of surrounding airspace opacity, possibly radiation pneumonitis if the patient has had interval radiation therapy. 2. Complete occlusion of the right upper lobe bronchus. Most of the right upper lobe is collapsed potentially with a small amount of airspace filling process; there is air trapping in portions of the right upper lobe anteriorly. 3. Airway thickening centrally in the right lung, resulting in some luminal narrowing of the bronchus intermedius. 4. There is a new 4 mm triangular-shaped left upper lobe pulmonary nodule along with a new small ground-glass density nodule in the left upper lobe which merit surveillance. 5. Mildly enlarged paratracheal and subcarinal lymph nodes likewise merits surveillance although could be reactive. 6. Other imaging findings of potential clinical significance: Aortic Atherosclerosis (ICD10-I70.0). Coronary atherosclerosis. Mild cardiomegaly with suggestion of left ventricular hypertrophy. Small right pleural effusion. Chronically stable small left adrenal mass, not previously hypermetabolic on PET-CT and hence likely benign. Electronically Signed   By: Van Clines M.D.   On: 03/17/2019 15:11     Vas [place] Lower Extremity Venous (dvt)  Result Date: 03/25/2019  Lower Venous Study Indications: Assess fr  new DVT sine 03/17/2019.  Performing Technologist: Toma Copier RVS  Examination Guidelines: A complete evaluation includes B-mode imaging, spectral Doppler, color Doppler, and power Doppler as needed of all accessible portions of each vessel. Bilateral testing is considered an integral part of a complete examination. Limited examinations for reoccurring indications may be performed as noted.  +---------+---------------+---------+-----------+----------+-------+ RIGHT    CompressibilityPhasicitySpontaneityPropertiesSummary +---------+---------------+---------+-----------+----------+-------+ CFV      Full           Yes      Yes                          +---------+---------------+---------+-----------+----------+-------+ SFJ      Full                                                 +---------+---------------+---------+-----------+----------+-------+ FV Prox  Full           Yes      Yes                          +---------+---------------+---------+-----------+----------+-------+ FV Mid   Full                                                 +---------+---------------+---------+-----------+----------+-------+ FV DistalFull           Yes      Yes                          +---------+---------------+---------+-----------+----------+-------+ PFV      Full           Yes      Yes                          +---------+---------------+---------+-----------+----------+-------+  POP      Full           Yes      Yes                          +---------+---------------+---------+-----------+----------+-------+ PTV      Full                                                 +---------+---------------+---------+-----------+----------+-------+ PERO     Full                                                 +---------+---------------+---------+-----------+----------+-------+   +---------+---------------+---------+-----------+----------+------------------+ LEFT      CompressibilityPhasicitySpontaneityPropertiesSummary            +---------+---------------+---------+-----------+----------+------------------+ CFV      Full           Yes      Yes                                     +---------+---------------+---------+-----------+----------+------------------+ SFJ      Full                                                            +---------+---------------+---------+-----------+----------+------------------+ FV Prox  Full           Yes      Yes                                     +---------+---------------+---------+-----------+----------+------------------+ FV Mid   Full                                                            +---------+---------------+---------+-----------+----------+------------------+ FV DistalFull           Yes      Yes                                     +---------+---------------+---------+-----------+----------+------------------+ PFV      Full           Yes      Yes                                     +---------+---------------+---------+-----------+----------+------------------+ POP      Full           Yes      Yes                                     +---------+---------------+---------+-----------+----------+------------------+  PTV      Partial                                                         +---------+---------------+---------+-----------+----------+------------------+ PERO     Full                                         Difficult to image +---------+---------------+---------+-----------+----------+------------------+ There is a partial DVT noted in the mid calf . There is no propagation noted however there appears to be some resolution of the DVT noted 03/17/2019    Summary: Right: There is no evidence of deep vein thrombosis in the lower extremity. No cystic structure found in the popliteal fossa. Left: There is no evidence of deep vein thrombosis in the lower extremity.  See technical notes listed above. No cystic structure found in the popliteal fossa.  *See table(s) above for measurements and observations. Electronically signed by Monica Martinez MD on 03/25/2019 at 4:27:15 PM.    Final    Vas [place] Lower Extremity Venous (dvt)  Result Date: 03/18/2019  Lower Venous Study Indications: Stroke.  Performing Technologist: Maudry Mayhew MHA, RDMS, RVT, RDCS  Examination Guidelines: A complete evaluation includes B-mode imaging, spectral Doppler, color Doppler, and power Doppler as needed of all accessible portions of each vessel. Bilateral testing is considered an integral part of a complete examination. Limited examinations for reoccurring indications may be performed as noted.  +---------+---------------+---------+-----------+----------+-------+ RIGHT    CompressibilityPhasicitySpontaneityPropertiesSummary +---------+---------------+---------+-----------+----------+-------+ CFV      Full           Yes      Yes                          +---------+---------------+---------+-----------+----------+-------+ SFJ      Full                                                 +---------+---------------+---------+-----------+----------+-------+ FV Prox  Full                                                 +---------+---------------+---------+-----------+----------+-------+ FV Mid   Full                                                 +---------+---------------+---------+-----------+----------+-------+ FV DistalFull                                                 +---------+---------------+---------+-----------+----------+-------+ PFV      Full                                                 +---------+---------------+---------+-----------+----------+-------+  POP      Full           Yes      Yes                          +---------+---------------+---------+-----------+----------+-------+ PTV      Full                                                  +---------+---------------+---------+-----------+----------+-------+ PERO     Full                                                 +---------+---------------+---------+-----------+----------+-------+   +---------+---------------+---------+-----------+----------+-------+ LEFT     CompressibilityPhasicitySpontaneityPropertiesSummary +---------+---------------+---------+-----------+----------+-------+ CFV      Full           Yes      Yes                          +---------+---------------+---------+-----------+----------+-------+ SFJ      Full                                                 +---------+---------------+---------+-----------+----------+-------+ FV Prox  Full                                                 +---------+---------------+---------+-----------+----------+-------+ FV Mid   Full                                                 +---------+---------------+---------+-----------+----------+-------+ FV DistalFull                                                 +---------+---------------+---------+-----------+----------+-------+ PFV      Full                                                 +---------+---------------+---------+-----------+----------+-------+ POP      Full           Yes      Yes                          +---------+---------------+---------+-----------+----------+-------+ PTV      None                    No                   Acute   +---------+---------------+---------+-----------+----------+-------+ PERO     Full                                                 +---------+---------------+---------+-----------+----------+-------+  Summary: Right: There is no evidence of deep vein thrombosis in the lower extremity. No cystic structure found in the popliteal fossa. Left: Findings consistent with acute deep vein thrombosis involving the left posterior tibial vein. No cystic structure found in the  popliteal fossa.  *See table(s) above for measurements and observations. Electronically signed by Servando Snare MD on 03/18/2019 at 4:34:38 PM.    Final     Labs:  Basic Metabolic Panel: BMP Latest Ref Rng & Units 03/22/2019 03/19/2019 03/18/2019  Glucose 70 - 99 mg/dL 159(H) 156(H) 191(H)  BUN 8 - 23 mg/dL 27(H) 39(H) 32(H)  Creatinine 0.61 - 1.24 mg/dL 1.30(H) 1.64(H) 1.51(H)  Sodium 135 - 145 mmol/L 141 141 140  Potassium 3.5 - 5.1 mmol/L 5.0 4.4 4.8  Chloride 98 - 111 mmol/L 109 106 106  CO2 22 - 32 mmol/L _0 Calcium 8.9 - 10.3 mg/dL 9.1 8.7(L) 8.8(L)    CBC: CBC Latest Ref Rng & Units 03/22/2019 03/19/2019 03/18/2019  WBC 4.0 - 10.5 K/uL 8.0 8.6 8.6  Hemoglobin 13.0 - 17.0 g/dL 13.6 12.3(L) 13.5  Hematocrit 39.0 - 52.0 % 45.0 40.1 43.3  Platelets 150 - 400 K/uL 275 218 228    CBG: Recent Labs  Lab 03/26/19 0621 03/26/19 1140 03/26/19 1631 03/26/19 2114 03/27/19 0635  GLUCAP 118* 159* 130* 137* 112*    Brief HPI:   Travis [name] is a 68 year old male with history of SCLC, CAD s/p PCA, T2DM, COPD, PE--on Eliquis, CKD III, recent PNA; who was admitted on 03/15/19 with nausea/vomiting that started night prior to admission, dizziness, speech changes and hypoxia.  MRI/MRA  brain done revealing 3cm acute infarct in right superolateral cerebellum and moderate white matter disease in pons and cerebral white matter. No large vessel occlusion seen. BLE dopplers done as part of work up and revealed acute DVT in left posterior tibial vein.  Eliquis increased to 10 mg bid for 14 days  and to be followed by home dose.   CT abdomen/pelvis done to rule out metastatic disease and revealed RLL and LLE nodules to be reduced with question of surrounding radiation pneumonitis, complete occlusion of RUL bronchus with potential collapse of most of the lobe with small amount of airspace filling process and new 4 mm LUL nodule and ground glass density as well as lymph node enlargement question reactive. He  continued to have issues with hypoxia but respiratory status improving with addition of doxycyline. Patient with functional decline and CIR recommended for follow up therapy.     Hospital Course: KATHAN KIRKER was admitted to rehab 03/19/2019 for inpatient therapies to consist of PT, ST and OT at least three hours five days a week. Past admission physiatrist, therapy team and rehab RN have worked together to provide customized collaborative inpatient rehab. Blood pressures have been monitored on twice daily basis and was stable on Benzapril, metoprolol and amlodipine.  BMET was repeated showing improvement in serum creatinine to 1.30. Diabetes has been monitored with ac/hs cgb checks and BS have been reasonably controlled. Lantus was resumed at 5 units daily. His po intake has been good and he is continent of B/B. He continued to have mild cough and developed blood-tinged sputum on 5/11.  Serial CBC showed H&H to be stable at 13.6/45.0 with platelets of 275.    Dr. Rodman Pickle was consulted for input and recommended continuing antiplatelet and anticoagulation therapy as well as initiation of antitussive agent.  She plans on reviewing  CT imaging from [place] and from Dr. Silvestre Mesi  office and will follow up with patient regarding need for flexible bronchoscopy for evaluation.  Mucinex bid was added and he was maintained on doxycycline thorough 5/15 to complete 10 day course of antibiotic regimen.  Dr. Julien Nordmann was also consulted for input and discussed the option of transitioning to subcu Lovenox however patient was not in favor of this of keeping him on Eliquis with IVC filter to prevent propagation of any further DVT to his lung. Follow-up Dopplers of BLE showed partial DVT with some resolution and IVC filter not needed per discussion with primary MD. He has made good gains and has progressed to supervision level. He will continue to receive follow up [place], [place] and [place] by [facility] after discharge.    Rehab  course: During patient's stay in rehab  team conference was held to monitor patient's progress, set goals and discuss barriers to discharge. At admission, patient required min assist with basic self-care tasks and mod assist with mobility.  Cognitive linguistic evaluation revealed ataxic dysarthria affecting speech intelligibility sentence level. He  has had improvement in activity tolerance, balance, postural control as well as ability to compensate for deficits.  He is able to complete ADL tasks with supervision.  He requires supervision for transfers and to ambulate 200 feet with rolling walker. His speech has improved with use of intelligibility strategies and is 100% intelligible at conversation level.  Patient did not feel in person family education needed and wife was instructed via telephone regarding need for supervision.      Disposition:    Diet: Heart healthy/Carb Modified.   Special Instructions:  Discharge Instructions    Ambulatory referral to Neurology   Complete by:  As directed    An appointment is requested in approximately call for appointment 4 weeks right cerebellar infarction   Ambulatory referral to Physical Medicine Rehab   Complete by:  As directed    Moderate complexity follow-up 1 to 2 weeks right cerebellar infarction     Allergies as of 03/27/2019      Reactions   Penicillins Other (See Comments)   UNSPECIFIED CHILDHOOD REACTION Has patient had PCN reaction causing immediate rash, facial/tongue/throat swelling, SOB or lightheadedness with hypotension: Unknown Has patient had a PCN reaction causing severe rash involving mucus membranes or skin necrosis: Unknown Has patient had a PCN reaction that required hospitalization: No Has patient had a PCN reaction occurring within the last 10 years: No If all of the above answers are "NO", then may proceed with Cephalosporin use.      Medication List    STOP taking these medications   doxycycline 100 MG  tablet Commonly known as:  VIBRA-TABS   insulin aspart 100 UNIT/ML injection Commonly known as:  novoLOG   Rivaroxaban 15 & 20 MG Tbpk   Turmeric 400 MG Caps     TAKE these medications   acetaminophen 325 MG tablet Commonly known as:  TYLENOL Take 2 tablets (650 mg total) by mouth every 4 (four) hours as needed for mild pain (or temp > 37.5 C (99.5 F)).   albuterol 108 (90 Base) MCG/ACT inhaler Commonly known as:  ProAir HFA Inhale 2 puffs into the lungs every 6 (six) hours as needed for wheezing or shortness of breath.   allopurinol 100 MG tablet Commonly known as:  ZYLOPRIM Take 1 tablet (100 mg total) by mouth at bedtime. What changed:  when to take this   amLODipine 10 MG  tablet Commonly known as:  NORVASC Take 1 tablet (10 mg total) by mouth daily.   apixaban 5 MG Tabs tablet Commonly known as:  ELIQUIS Take 1 tablet (5 mg total) by mouth 2 (two) times daily.   aspirin EC 81 MG tablet Take 1 tablet (81 mg total) by mouth daily.   benazepril 40 MG tablet Commonly known as:  LOTENSIN Take 1 tablet (40 mg total) by mouth at bedtime.   benzonatate 100 MG capsule Commonly known as:  TESSALON Take 1 capsule (100 mg total) by mouth 3 (three) times daily as needed for cough.   calcitRIOL 0.25 MCG capsule Commonly known as:  ROCALTROL Take 1 capsule (0.25 mcg total) by mouth daily.   Cinnamon 500 MG capsule Take 500 mg by mouth daily.   dextromethorphan 30 MG/5ML liquid Commonly known as:  DELSYM Take 2.5 mLs (15 mg total) by mouth 2 (two) times daily.   guaiFENesin 600 MG 12 hr tablet Commonly known as:  MUCINEX Take 1 tablet (600 mg total) by mouth 2 (two) times daily.   insulin glargine 100 unit/mL Sopn Commonly known as:  LANTUS Inject 0.05 mLs (5 Units total) into the skin daily.   Insulin Glargine 100 UNIT/ML Solostar Pen Commonly known as:  LANTUS Inject 5 Units into the skin daily.   metoprolol tartrate 100 MG tablet Commonly known as:   LOPRESSOR Take 1 tablet (100 mg total) by mouth 2 (two) times daily.   mirtazapine 15 MG tablet Commonly known as:  REMERON Take 1 tablet (15 mg total) by mouth at bedtime.      Follow-up Information    Kirsteins, Luanna Salk, MD Follow up.   Specialty:  Physical Medicine and Rehabilitation Why:  Office to call for appointment Contact information: [place] [address] [phone]        Margaretha Seeds, MD Follow up.   Specialty:  Pulmonary Disease Why:  Call for appointment as needed Contact information: [place] [address] [phone]        Curt Bears, MD Follow up.   Specialty:  Oncology Why:  Call for appointment Contact information: [place] [address] [phone]           Signed: Bary Leriche 03/30/2019, 7:00 PM

## 2019-03-30 ENCOUNTER — Telehealth: Payer: Self-pay | Admitting: Internal Medicine

## 2019-03-30 ENCOUNTER — Telehealth: Payer: Self-pay

## 2019-03-30 ENCOUNTER — Telehealth: Payer: Self-pay | Admitting: Registered Nurse

## 2019-03-30 DIAGNOSIS — Z8701 Personal history of pneumonia (recurrent): Secondary | ICD-10-CM | POA: Diagnosis not present

## 2019-03-30 DIAGNOSIS — I252 Old myocardial infarction: Secondary | ICD-10-CM | POA: Diagnosis not present

## 2019-03-30 DIAGNOSIS — I2510 Atherosclerotic heart disease of native coronary artery without angina pectoris: Secondary | ICD-10-CM | POA: Diagnosis not present

## 2019-03-30 DIAGNOSIS — I10 Essential (primary) hypertension: Secondary | ICD-10-CM

## 2019-03-30 DIAGNOSIS — I129 Hypertensive chronic kidney disease with stage 1 through stage 4 chronic kidney disease, or unspecified chronic kidney disease: Secondary | ICD-10-CM | POA: Diagnosis not present

## 2019-03-30 DIAGNOSIS — E1151 Type 2 diabetes mellitus with diabetic peripheral angiopathy without gangrene: Secondary | ICD-10-CM | POA: Diagnosis not present

## 2019-03-30 DIAGNOSIS — Z794 Long term (current) use of insulin: Secondary | ICD-10-CM | POA: Diagnosis not present

## 2019-03-30 DIAGNOSIS — C3491 Malignant neoplasm of unspecified part of right bronchus or lung: Secondary | ICD-10-CM | POA: Diagnosis not present

## 2019-03-30 DIAGNOSIS — K920 Hematemesis: Secondary | ICD-10-CM

## 2019-03-30 DIAGNOSIS — N183 Chronic kidney disease, stage 3 (moderate): Secondary | ICD-10-CM | POA: Diagnosis not present

## 2019-03-30 DIAGNOSIS — Z86718 Personal history of other venous thrombosis and embolism: Secondary | ICD-10-CM | POA: Diagnosis not present

## 2019-03-30 DIAGNOSIS — J189 Pneumonia, unspecified organism: Secondary | ICD-10-CM

## 2019-03-30 DIAGNOSIS — I824Z9 Acute embolism and thrombosis of unspecified deep veins of unspecified distal lower extremity: Secondary | ICD-10-CM

## 2019-03-30 DIAGNOSIS — E1122 Type 2 diabetes mellitus with diabetic chronic kidney disease: Secondary | ICD-10-CM | POA: Diagnosis not present

## 2019-03-30 DIAGNOSIS — I69351 Hemiplegia and hemiparesis following cerebral infarction affecting right dominant side: Secondary | ICD-10-CM | POA: Diagnosis not present

## 2019-03-30 DIAGNOSIS — Z86711 Personal history of pulmonary embolism: Secondary | ICD-10-CM | POA: Diagnosis not present

## 2019-03-30 LAB — PROTHROMBIN GENE MUTATION

## 2019-03-30 NOTE — Telephone Encounter (Signed)
Transitional Care call-wife- Tina    1. Are you/is patient experiencing any problems since coming home?No Are there any questions regarding any aspect of care?No 2. Are there any questions regarding medications administration/dosing? No Are meds being taken as prescribed?Yes Patient should review meds with caller to confirm 3. Have there been any falls?No 4. Has Home Health been to the house and/or have they contacted you?Yes If not, have you tried to contact them? Can we help you contact them? 5. Are bowels and bladder emptying properly? Yes Are there any unexpected incontinence issues? No iIf applicable, is patient following bowel/bladder programs? 6. Any fevers, problems with breathing, unexpected pain?No 7. Are there any skin problems or new areas of breakdown?No 8. Has the patient/family member arranged specialty MD follow up (ie cardiology/neurology/renal/surgical/etc)?Yes  Can we help arrange?Patient's wife contacted Chi Rodman Pickle, MD-Pulmonologist, card given to her that said call be 03/30/2019 but the staff didn't know what she was speaking of but suppose to be researching why he needed to come in 9. Does the patient need any other services or support that we can help arrange?No 10. Are caregivers following through as expected in assisting the patient?No 11. Has the patient quit smoking, drinking alcohol, or using drugs as recommended?Yes  Appointment time 2:20 arrive time 2:00 on 04/07/2019 with Danella Sensing, NP [place]

## 2019-03-30 NOTE — Telephone Encounter (Signed)
I cld and spoke to pt's wife to schedule a hospital fu appt. Appt has been scheduled for the pt to see Dr. Julien Nordmann on 6/3 at 12:15pm.

## 2019-03-30 NOTE — Telephone Encounter (Signed)
Placed a call to Travis [name] for clarification regarding Pulmonary follow up. This provider placed a call to Travis [name] regarding the above. Travis [name] stated " Dr. [name] seen Travis [name] in the hospital, she thought Travis [name] had a pulmonologist at the [facility]. Travis [name] is adamant he doesn't have a pulmonologist, his wife called Dr. [name] office and the office staff stated they didn't know why she was calling. This provider placed a call to Dr. [name] office, he will be scheduled for HFU appointment with the NP. This provider called Travis [name] and discussed the above, he verbalizes understanding. He will call today and schedule the appointment. He was instructed to keep all scheduled appointments, he verbalizes understanding.

## 2019-03-31 ENCOUNTER — Telehealth: Payer: Self-pay

## 2019-03-31 DIAGNOSIS — I129 Hypertensive chronic kidney disease with stage 1 through stage 4 chronic kidney disease, or unspecified chronic kidney disease: Secondary | ICD-10-CM | POA: Diagnosis not present

## 2019-03-31 DIAGNOSIS — I2510 Atherosclerotic heart disease of native coronary artery without angina pectoris: Secondary | ICD-10-CM | POA: Diagnosis not present

## 2019-03-31 DIAGNOSIS — E1122 Type 2 diabetes mellitus with diabetic chronic kidney disease: Secondary | ICD-10-CM | POA: Diagnosis not present

## 2019-03-31 DIAGNOSIS — E1151 Type 2 diabetes mellitus with diabetic peripheral angiopathy without gangrene: Secondary | ICD-10-CM | POA: Diagnosis not present

## 2019-03-31 DIAGNOSIS — I69351 Hemiplegia and hemiparesis following cerebral infarction affecting right dominant side: Secondary | ICD-10-CM | POA: Diagnosis not present

## 2019-03-31 DIAGNOSIS — N183 Chronic kidney disease, stage 3 (moderate): Secondary | ICD-10-CM | POA: Diagnosis not present

## 2019-03-31 NOTE — Telephone Encounter (Signed)
Travis [name] called requesting verbal orders for speech therapy for 1xwk X 1wk followed by 2xwk X 4 wks.  Called her back and approved verbal orders.

## 2019-04-01 ENCOUNTER — Telehealth: Payer: Self-pay | Admitting: *Deleted

## 2019-04-01 NOTE — Telephone Encounter (Signed)
Travis [name], OT, [facility] left a message asking for verbal orders for [facility] 1week4 followed by 2week4 followed by 1week1.  Medical record reviewed. Social work note reviewed. Verbal orders given per office protocol.

## 2019-04-02 ENCOUNTER — Encounter: Payer: Self-pay | Admitting: Primary Care

## 2019-04-02 ENCOUNTER — Telehealth: Payer: Self-pay | Admitting: Pulmonary Disease

## 2019-04-02 ENCOUNTER — Encounter: Payer: Self-pay | Admitting: Pulmonary Disease

## 2019-04-02 DIAGNOSIS — N183 Chronic kidney disease, stage 3 (moderate): Secondary | ICD-10-CM | POA: Diagnosis not present

## 2019-04-02 DIAGNOSIS — I82402 Acute embolism and thrombosis of unspecified deep veins of left lower extremity: Secondary | ICD-10-CM | POA: Diagnosis not present

## 2019-04-02 DIAGNOSIS — D6851 Activated protein C resistance: Secondary | ICD-10-CM | POA: Diagnosis not present

## 2019-04-02 DIAGNOSIS — C3490 Malignant neoplasm of unspecified part of unspecified bronchus or lung: Secondary | ICD-10-CM | POA: Diagnosis not present

## 2019-04-02 DIAGNOSIS — G464 Cerebellar stroke syndrome: Secondary | ICD-10-CM | POA: Diagnosis not present

## 2019-04-02 DIAGNOSIS — I129 Hypertensive chronic kidney disease with stage 1 through stage 4 chronic kidney disease, or unspecified chronic kidney disease: Secondary | ICD-10-CM | POA: Diagnosis not present

## 2019-04-02 DIAGNOSIS — K920 Hematemesis: Secondary | ICD-10-CM | POA: Diagnosis not present

## 2019-04-02 DIAGNOSIS — E1129 Type 2 diabetes mellitus with other diabetic kidney complication: Secondary | ICD-10-CM | POA: Diagnosis not present

## 2019-04-02 DIAGNOSIS — J441 Chronic obstructive pulmonary disease with (acute) exacerbation: Secondary | ICD-10-CM | POA: Diagnosis not present

## 2019-04-02 NOTE — Progress Notes (Signed)
Called and spoke with pt asking which [facility] and pt stated he goes to [facility]. I stated to him that we were going to try to obtain records from [place] of CT and other imaging reports and also receive records from PCP due to the recent hemoptysis he has had and pt expressed understanding.  Pt was recently hospitalized 5/8 and in the discharge summary, there is a note for pt to follow up with Dr. [name].   Called Dr. Silvestre Mesi office to speak with medical records dept but [facility] with medical records was unavailable. Left message for Baker Janus to return call so we can see what all we need to do to obtain records on pt. Will await a return call. While awaiting a call back, I am going to go ahead and put needed info on a fax coversheet and send it to Dr. Silvestre Mesi office.  [facility] [place] at [phone] and spoke with Ronalee Belts, operator at the [facility] letting him know what I was needing and per Ronalee Belts, he is going to fax pt's records to our office. Will await fax.

## 2019-04-02 NOTE — Telephone Encounter (Signed)
LMTCB. We need records, please see prior note.

## 2019-04-02 NOTE — Telephone Encounter (Signed)
[place] [facility] is calling back [phone]

## 2019-04-02 NOTE — Telephone Encounter (Signed)
Attempted to call Anderson Malta in referrals dept but unable to reach her. Left detailed message for Anderson Malta that we were trying to obtain records on pt (last OV, recent labwork, cxr and other imaging especially CT from 4/20). Left message for Anderson Malta to return call so we can see if we need to send a request of the records to the office or if they can go ahead and fax info to our office. Will await a return call from [place].

## 2019-04-06 ENCOUNTER — Telehealth: Payer: Self-pay | Admitting: Adult Health

## 2019-04-06 NOTE — Telephone Encounter (Signed)
[facility]. They do not open until 0830. Will route message to triage for follow up.

## 2019-04-06 NOTE — Telephone Encounter (Signed)
Pt gave consent on the phone for [facility] apt.Pt understands that although there may be some limitations with this type of visit, we will take all precautions to reduce any security or privacy concerns.  Pt understands that this will be treated like an in office visit and we will file with pt's insurance, and there may be a patient responsible charge related to this service. Sent email w ith to pt @ [email or web address]

## 2019-04-06 NOTE — Telephone Encounter (Signed)
Left message for Anderson Malta to call us back.

## 2019-04-07 ENCOUNTER — Telehealth: Payer: Self-pay | Admitting: Primary Care

## 2019-04-07 ENCOUNTER — Encounter: Payer: Self-pay | Admitting: Primary Care

## 2019-04-07 ENCOUNTER — Ambulatory Visit (INDEPENDENT_AMBULATORY_CARE_PROVIDER_SITE_OTHER): Payer: Medicare Other | Admitting: Primary Care

## 2019-04-07 ENCOUNTER — Other Ambulatory Visit: Payer: Self-pay

## 2019-04-07 ENCOUNTER — Encounter: Payer: Self-pay | Admitting: Registered Nurse

## 2019-04-07 ENCOUNTER — Ambulatory Visit (INDEPENDENT_AMBULATORY_CARE_PROVIDER_SITE_OTHER): Payer: Medicare Other

## 2019-04-07 ENCOUNTER — Encounter: Payer: Medicare Other | Attending: Registered Nurse | Admitting: Registered Nurse

## 2019-04-07 VITALS — BP 124/76 | HR 64 | Temp 98.3°F | Ht 71.0 in | Wt 203.8 lb

## 2019-04-07 VITALS — BP 93/52 | HR 58 | Resp 14 | Ht 71.0 in | Wt 203.0 lb

## 2019-04-07 DIAGNOSIS — R918 Other nonspecific abnormal finding of lung field: Secondary | ICD-10-CM

## 2019-04-07 DIAGNOSIS — I255 Ischemic cardiomyopathy: Secondary | ICD-10-CM | POA: Diagnosis not present

## 2019-04-07 DIAGNOSIS — I10 Essential (primary) hypertension: Secondary | ICD-10-CM | POA: Diagnosis not present

## 2019-04-07 DIAGNOSIS — I129 Hypertensive chronic kidney disease with stage 1 through stage 4 chronic kidney disease, or unspecified chronic kidney disease: Secondary | ICD-10-CM | POA: Diagnosis not present

## 2019-04-07 DIAGNOSIS — J441 Chronic obstructive pulmonary disease with (acute) exacerbation: Secondary | ICD-10-CM

## 2019-04-07 DIAGNOSIS — E1151 Type 2 diabetes mellitus with diabetic peripheral angiopathy without gangrene: Secondary | ICD-10-CM | POA: Diagnosis not present

## 2019-04-07 DIAGNOSIS — R042 Hemoptysis: Secondary | ICD-10-CM

## 2019-04-07 DIAGNOSIS — N183 Chronic kidney disease, stage 3 (moderate): Secondary | ICD-10-CM | POA: Diagnosis not present

## 2019-04-07 DIAGNOSIS — I2510 Atherosclerotic heart disease of native coronary artery without angina pectoris: Secondary | ICD-10-CM | POA: Diagnosis not present

## 2019-04-07 DIAGNOSIS — C3492 Malignant neoplasm of unspecified part of left bronchus or lung: Secondary | ICD-10-CM | POA: Diagnosis not present

## 2019-04-07 DIAGNOSIS — C3491 Malignant neoplasm of unspecified part of right bronchus or lung: Secondary | ICD-10-CM | POA: Diagnosis not present

## 2019-04-07 DIAGNOSIS — I69351 Hemiplegia and hemiparesis following cerebral infarction affecting right dominant side: Secondary | ICD-10-CM | POA: Diagnosis not present

## 2019-04-07 DIAGNOSIS — I639 Cerebral infarction, unspecified: Secondary | ICD-10-CM | POA: Insufficient documentation

## 2019-04-07 DIAGNOSIS — J181 Lobar pneumonia, unspecified organism: Secondary | ICD-10-CM | POA: Diagnosis not present

## 2019-04-07 DIAGNOSIS — Z85118 Personal history of other malignant neoplasm of bronchus and lung: Secondary | ICD-10-CM | POA: Diagnosis not present

## 2019-04-07 DIAGNOSIS — E1122 Type 2 diabetes mellitus with diabetic chronic kidney disease: Secondary | ICD-10-CM | POA: Diagnosis not present

## 2019-04-07 MED ORDER — HYDROCOD POLST-CPM POLST ER 10-8 MG/5ML PO SUER: 5.0000 mL | Freq: Two times a day (BID) | ORAL | 0 refills | Status: AC

## 2019-04-07 NOTE — Progress Notes (Signed)
Records received from [facility] for Dr. [name].

## 2019-04-07 NOTE — Progress Notes (Signed)
@Patient  ID: RHYS LICHTY, male    DOB: 1951-07-03, 68 y.o.   MRN: [id number]  Chief Complaint  Patient presents with   Follow-up    cough with occasional gray,     Referring provider: [name], MD  HPI: 68 year old male. PMH significant for stage IA NSCLC squamous cell carcinoma s/p radiation, recent right cerebellar infarct, left DVT/PE diagnosed in 10/2018 (on Eliquis), MI s/p stent (on ASA), CKD stage III. Diagnosed with SCLC by bronchoscopy in June 2019 by Dr. Servando Snare. Underwent sterotactic radiotherapy to both lower lobe lesions and RUL per Dr. Tammi Klippel (completed 10 treatments per patient).   Hospital/rehab course: Admitted 5/4-5/8 for right cerebellar infarct 5/8 admitted to [facility] 5/11 PCCM consulted for hemoptysis. Seen for in-patient consult by Dr. [name] for new hemoptysis while on Eliquis and ASA at rehab. HGB remained stable. Hemoptysis may be related to know squamous cell carcinoma. Last PET in October appeared stable with no evidence of disease progression, however, CT on admission shows lung collapse. May warrant bronchoscopy in the furture. Continue antiplatelet and anticoagulation therapy and monitor hemoptysis. Added anti-tussive agent to therapy.   04/07/2019 Patient presents today for hospital follow-up. He feels well. No further episodes of hemoptysis. Continues to have productive cough, brings up green/grey mucus twice a day with improvement in his symtpoms. Taking guaifenesin cough medication 2-3 times a day. No trouble breathing. Ambulating with walker. Eating ok. Some trouble with his words. Equal arm strength. Denies fever.   Testing reviewed: 03/17/19 CT CHEST - 1. Both the RLL pulmonary nodule on the LLL pulmonary nodule are reduced in conspicuity with some degree of surrounding airspace opacity, possibly radiation pneumonitis 2. Complete occlusion of the RUL bronchus. Most of the RUL is collapsed potentially with a small amount of airspace filling process;  there is air trapping in portions of the right upper lobe anteriorly. 3. Airway thickening centrally in the right lung, resulting in some luminal narrowing of the bronchus intermedius. 4. There is a new 4 mm triangular-shaped left upper lobe pulmonary nodule along with a new small ground-glass density nodule in the left upper lobe which merit surveillance.  03/15/19 CXR- Mild interval worsening masslike opacity over the right suprahilar/paramediastinal region likely patient's known right lung cancer. Minimal mixed interstitial airspace density more peripherally over the right upper lobe/apex.  Allergies  Allergen Reactions   Penicillins Other (See Comments)    UNSPECIFIED CHILDHOOD REACTION Has patient had PCN reaction causing immediate rash, facial/tongue/throat swelling, SOB or lightheadedness with hypotension: Unknown Has patient had a PCN reaction causing severe rash involving mucus membranes or skin necrosis: Unknown Has patient had a PCN reaction that required hospitalization: No Has patient had a PCN reaction occurring within the last 10 years: No If all of the above answers are "NO", then may proceed with Cephalosporin use.      There is no immunization history on file for this patient.  Past Medical History:  Diagnosis Date   CAD (coronary artery disease)    Cancer ([place])    skin cancer face   COPD (chronic obstructive pulmonary disease) ([place])    Coronary artery disease    PCA 1994l; PCI august 2003 of an obtust marginal. Catheterization at the time reveal: left main normal; LAD 50-75% between first and second diagonals, 50% after lesion; left circumflex 95% in obtuse marg. RCA 100% proximal.   Diabetes mellitus    type 2   Hyperlipemia    Hypertension    Kidney disease  chronic; stage 2 followed By Dr Deterding   Myocardial infarction [facility])    Osteoarthritis    Peripheral vascular disease ([place])    Tobacco abuse     Tobacco History: Social History    Tobacco Use  Smoking Status Former Smoker   Packs/day: 0.50   Years: 50.00   Pack years: 25.00   Types: Cigarettes   Last attempt to quit: 05/09/2015   Years since quitting: 3.9  Smokeless Tobacco Former User   Types: Chew   Quit date: 2016   Counseling given: Not Answered   Outpatient Medications Prior to Visit  Medication Sig Dispense Refill   acetaminophen (TYLENOL) 325 MG tablet Take 2 tablets (650 mg total) by mouth every 4 (four) hours as needed for mild pain (or temp > 37.5 C (99.5 F)).     albuterol (PROAIR HFA) 108 (90 Base) MCG/ACT inhaler Inhale 2 puffs into the lungs every 6 (six) hours as needed for wheezing or shortness of breath. 1 Inhaler 0   allopurinol (ZYLOPRIM) 100 MG tablet Take 1 tablet (100 mg total) by mouth at bedtime. 30 tablet 0   amLODipine (NORVASC) 10 MG tablet Take 1 tablet (10 mg total) by mouth daily. 30 tablet 0   apixaban (ELIQUIS) 5 MG TABS tablet Take 1 tablet (5 mg total) by mouth 2 (two) times daily. 60 tablet 1   aspirin EC 81 MG tablet Take 1 tablet (81 mg total) by mouth daily. 90 tablet 3   benazepril (LOTENSIN) 40 MG tablet Take 1 tablet (40 mg total) by mouth at bedtime. 30 tablet 0   benzonatate (TESSALON) 100 MG capsule Take 1 capsule (100 mg total) by mouth 3 (three) times daily as needed for cough. 20 capsule 0   calcitRIOL (ROCALTROL) 0.25 MCG capsule Take 1 capsule (0.25 mcg total) by mouth daily. 30 capsule 6   Cinnamon 500 MG capsule Take 500 mg by mouth daily.     dextromethorphan (DELSYM) 30 MG/5ML liquid Take 2.5 mLs (15 mg total) by mouth 2 (two) times daily. 89 mL 0   guaiFENesin (MUCINEX) 600 MG 12 hr tablet Take 1 tablet (600 mg total) by mouth 2 (two) times daily. 30 tablet 0   Insulin Glargine, 2 Unit Dial, (TOUJEO MAX SOLOSTAR) 300 UNIT/ML SOPN Inject 28 Units/day into the skin every morning.     Insulin Glargine, 2 Unit Dial, (TOUJEO MAX SOLOSTAR) 300 UNIT/ML SOPN Inject 11 Units/day into the skin  every evening.     metoprolol tartrate (LOPRESSOR) 100 MG tablet Take 1 tablet (100 mg total) by mouth 2 (two) times daily. 60 tablet 0   mirtazapine (REMERON) 15 MG tablet Take 1 tablet (15 mg total) by mouth at bedtime. 30 tablet 0   Insulin Glargine (LANTUS) 100 UNIT/ML Solostar Pen Inject 5 Units into the skin daily. 15 mL 11   insulin glargine (LANTUS) 100 unit/mL SOPN Inject 0.05 mLs (5 Units total) into the skin daily. 15 mL 11   No facility-administered medications prior to visit.     Review of Systems  Review of Systems  Constitutional: Negative.   HENT: Positive for congestion. Negative for trouble swallowing.   Respiratory: Positive for cough. Negative for shortness of breath and wheezing.   Cardiovascular: Negative.     Physical Exam  BP 124/76 (BP Location: Left Arm, Cuff Size: Normal)    Pulse 64    Temp 98.3 F (36.8 C)    Ht 5\' 11"  (1.803 m)    Wt 203 lb  12.8 oz (92.4 kg)    SpO2 94%    BMI 28.42 kg/m  Physical Exam Constitutional:      Appearance: Normal appearance.  HENT:     Nose: Nose normal.     Mouth/Throat:     Mouth: Mucous membranes are moist.     Pharynx: Oropharynx is clear.  Eyes:     Conjunctiva/sclera: Conjunctivae normal.     Pupils: Pupils are equal, round, and reactive to light.  Cardiovascular:     Rate and Rhythm: Normal rate.  Pulmonary:     Effort: Pulmonary effort is normal.     Breath sounds: No wheezing.     Comments: CTA Musculoskeletal: Normal range of motion.     Comments: Amb with walker, steady gait  Skin:    General: Skin is warm and dry.  Neurological:     General: No focal deficit present.     Mental Status: He is alert and oriented to person, place, and time.     Motor: Weakness present.     Comments: +4 BUE strength   Psychiatric:        Mood and Affect: Mood normal.        Behavior: Behavior normal.        Thought Content: Thought content normal.        Judgment: Judgment normal.      Lab Results:  CBC     Component Value Date/Time   WBC 8.0 03/22/2019 0702   RBC 5.21 03/22/2019 0702   HGB 13.6 03/22/2019 0702   HCT 45.0 03/22/2019 0702   PLT 275 03/22/2019 0702   MCV 86.4 03/22/2019 0702   MCH 26.1 03/22/2019 0702   MCHC 30.2 03/22/2019 0702   RDW 14.0 03/22/2019 0702   LYMPHSABS 0.9 03/22/2019 0702   MONOABS 0.7 03/22/2019 0702   EOSABS 0.4 03/22/2019 0702   BASOSABS 0.1 03/22/2019 0702    BMET    Component Value Date/Time   NA 141 03/22/2019 0702   K 5.0 03/22/2019 0702   CL 109 03/22/2019 0702   CO2 25 03/22/2019 0702   GLUCOSE 159 (H) 03/22/2019 0702   BUN 27 (H) 03/22/2019 0702   CREATININE 1.30 (H) 03/22/2019 0702   CREATININE 2.20 (H) 03/29/2015 1130   CALCIUM 9.1 03/22/2019 0702   GFRNONAA 56 (L) 03/22/2019 0702   GFRAA >60 03/22/2019 0702    BNP No results found for: BNP  ProBNP    Component Value Date/Time   PROBNP 193.0 (H) 10/26/2009 1603    Imaging: Dg Chest 2 View  Result Date: 04/07/2019 CLINICAL DATA:  Follow-up pneumonia, history of lung cancer EXAM: CHEST - 2 VIEW COMPARISON:  03/15/2019 chest radiograph. FINDINGS: Fiducial markers overlie right mid and left lower lungs. Stable cardiomediastinal silhouette with normal heart size. No pneumothorax. No pleural effusion. Patchy right upper lobe consolidation is decreased, with underlying chronic right upper lung parenchymal band. Stable mild hazy right lung base opacity. Clear left lung. IMPRESSION: Patchy right upper lung consolidation is decreased suggesting improving pneumonia, with underlying chronic right upper lung parenchymal band. Stable mild hazy right lung base opacity, favor scarring or atelectasis. Electronically Signed   By: Ilona Sorrel M.D.   On: 04/07/2019 12:44   Ct Head Wo Contrast  Result Date: 03/15/2019 CLINICAL DATA:  68 year old male with acute dizziness and ataxia. EXAM: CT HEAD WITHOUT CONTRAST TECHNIQUE: Contiguous axial images were obtained from the base of the skull through  the vertex without intravenous contrast. COMPARISON:  None. FINDINGS:  Brain: An infarct within the SUPERIOR RIGHT cerebellum appears acute or subacute. There is no evidence of hemorrhage. Mild atrophy and chronic small-vessel white matter ischemic changes are noted. No mass lesion, midline shift, hydrocephalus or extra-axial collection noted. Vascular: Carotid and vertebral atherosclerotic calcifications noted. Skull: Normal. Negative for fracture or focal lesion. Sinuses/Orbits: No acute finding. Other: None. IMPRESSION: 1. Acute to subacute RIGHT cerebellar infarct. No evidence of hemorrhage. 2. Atrophy and chronic small-vessel white matter ischemic changes. Electronically Signed   By: Margarette Canada M.D.   On: 03/15/2019 12:49   Ct Chest W Contrast  Result Date: 03/17/2019 CLINICAL DATA:  Squamous cell carcinoma the lungs. Right cerebellar stroke. EXAM: CT CHEST, ABDOMEN, AND PELVIS WITH CONTRAST TECHNIQUE: Multidetector CT imaging of the chest, abdomen and pelvis was performed following the standard protocol during bolus administration of intravenous contrast. CONTRAST:  80mL OMNIPAQUE IOHEXOL 300 MG/ML  SOLN COMPARISON:  Chest CT from 06/04/2018 and PET-CT from 08/15/2017 FINDINGS: CT CHEST FINDINGS Cardiovascular: Coronary, aortic arch, and branch vessel atherosclerotic vascular disease. Mild cardiomegaly. Interventricular septal thickening suggesting left ventricular hypertrophy. Mediastinum/Nodes: Right paratracheal node 1.0 cm in short axis on image 19/3, formerly 0.6 cm. Additional small right paratracheal lymph nodes are below 1 cm in diameter but mildly increased from prior exam. Subcarinal node 1.1 cm in short axis on image 30/3, formerly 0.5 cm. Bilateral gynecomastia. Lungs/Pleura: Small right pleural effusion. There is complete occlusion of the right upper lobe bronchus. There is atelectasis and possibly some mild alveolar filling airspace opacity posteriorly in the right upper lobe, with aerated  anterior portion of the right upper lobe. Airway thickening along the bronchus intermedius resulting in some luminal narrowing, with airway thickening centrally in the right middle lobe and right lower lobe. Superior segment right lower lobe airspace opacity on image 67/5 could be from prior radiation therapy though infection is not excluded. Fiducials are noted in the superior segment right lower lobe. Further inferiorly in the vicinity of the prior pulmonary nodule, there is indistinct patchy opacity which could be from radiation pneumonitis or pneumonia, for example as shown on image 93/5. The original nodule is not readily apparent against this background density. There is some associated surrounding ground-glass opacity. There is some faint nodularity peripherally in the left upper lobe. On image 62/5, a triangular 0.4 by 0.3 cm nodule is new or significantly increased from prior. Indistinct ground-glass density nodule in the left upper lobe measuring 0.9 cm in diameter on image 78/5 is new. Subsegmental atelectasis is present anteriorly in left lower lobe. Small fiducials are present anteriorly in the left lower lobe and there are bandlike opacities along the left hemidiaphragm with crowding of bronchovascular structures suggesting some degree of volume loss. The lobulated nodule along the inferior pulmonary ligament is somewhat obscured by surrounding atelectasis, for example on image 114/5, but is less sharply defined and possibly less prominent. There is some additional nodularity in this vicinity, for example posteriorly on image 112/5 where there is a 1.7 by 1.3 cm ill-defined nodular density, probably inflammatory. Musculoskeletal: Thoracic spondylosis. CT ABDOMEN PELVIS FINDINGS Hepatobiliary: Cholecystectomy. The common bile duct measures 1.1 cm in diameter, not changed from 08/15/2017, probably a physiologic response to cholecystectomy. No focal liver lesion is identified. Pancreas: Unremarkable  Spleen: Unremarkable Adrenals/Urinary Tract: 1.7 by 1.8 cm left adrenal mass is unchanged in size from 08/15/2017 and was not previously hypermetabolic, hence likely benign. Small hypodense lesions of the left mid kidney right kidney upper pole probably cysts but technically  too small to characterize. Considerable vascular calcifications along the renal hila bilaterally. No definite urinary tract calculi. Stomach/Bowel: Unremarkable Vascular/Lymphatic: Aortoiliac atherosclerotic vascular disease. No pathologic adenopathy identified. Reproductive: Faint calcification in the right posterior prostate gland. Other: No supplemental non-categorized findings. Musculoskeletal: Incidental lipoma in the right vastus lateralis muscle. Degenerative disc disease at L4-5. IMPRESSION: 1. Both the right lower lobe pulmonary nodule on the left lower lobe pulmonary nodule are reduced in conspicuity with some degree of surrounding airspace opacity, possibly radiation pneumonitis if the patient has had interval radiation therapy. 2. Complete occlusion of the right upper lobe bronchus. Most of the right upper lobe is collapsed potentially with a small amount of airspace filling process; there is air trapping in portions of the right upper lobe anteriorly. 3. Airway thickening centrally in the right lung, resulting in some luminal narrowing of the bronchus intermedius. 4. There is a new 4 mm triangular-shaped left upper lobe pulmonary nodule along with a new small ground-glass density nodule in the left upper lobe which merit surveillance. 5. Mildly enlarged paratracheal and subcarinal lymph nodes likewise merits surveillance although could be reactive. 6. Other imaging findings of potential clinical significance: Aortic Atherosclerosis (ICD10-I70.0). Coronary atherosclerosis. Mild cardiomegaly with suggestion of left ventricular hypertrophy. Small right pleural effusion. Chronically stable small left adrenal mass, not previously  hypermetabolic on PET-CT and hence likely benign. Electronically Signed   By: Van Clines M.D.   On: 03/17/2019 15:11   Mr Jodene Nam Head Wo Contrast  Result Date: 03/15/2019 CLINICAL DATA:  Dizziness.  Abnormal cord in a shin. EXAM: MRI HEAD WITHOUT CONTRAST MRA HEAD WITHOUT CONTRAST TECHNIQUE: Multiplanar, multiecho pulse sequences of the brain and surrounding structures were obtained without intravenous contrast. Angiographic images of the head were obtained using MRA technique without contrast. COMPARISON:  Head CT same day FINDINGS: MRI HEAD FINDINGS Brain: Diffusion imaging confirms the presence of a 3 cm region of acute infarction in the right superior and lateral cerebellum. Mild swelling but no hemorrhage or mass effect. No other acute infarction. Elsewhere, there chronic small-vessel ischemic changes of the pons. There are moderate chronic small-vessel ischemic changes of the cerebral hemispheric white matter. No large vessel territory infarction. No mass lesion, hemorrhage, hydrocephalus or extra-axial collection. Vascular: Major vessels at the base of the brain show flow. Skull and upper cervical spine: Negative Sinuses/Orbits: Clear/normal Other: None MRA HEAD FINDINGS Both internal carotid arteries are widely patent through the skull base and siphon regions. The anterior and middle cerebral vessels are patent without proximal stenosis, aneurysm or vascular malformation. Both vertebral arteries are patent at the foramen magnum and to the basilar. No basilar stenosis. Posterior inferior cerebellar arteries show flow on each side. Right anterior inferior cerebellar artery is seen. Both superior cerebellar arteries show flow presently. Both posterior cerebral arteries show flow. IMPRESSION: 3 cm region of acute infarction in the right superolateral cerebellum. No hemorrhage or mass effect. Moderate chronic small-vessel ischemic changes of the pons and cerebral hemispheric white matter. Intracranial MR  angiography negative for large or medium vessel occlusion. Specifically, no proximal posterior circulation occlusion seen to explain the infarction. Electronically Signed   By: Nelson Chimes M.D.   On: 03/15/2019 14:56   Mr Brain Wo Contrast  Result Date: 03/15/2019 CLINICAL DATA:  Dizziness.  Abnormal cord in a shin. EXAM: MRI HEAD WITHOUT CONTRAST MRA HEAD WITHOUT CONTRAST TECHNIQUE: Multiplanar, multiecho pulse sequences of the brain and surrounding structures were obtained without intravenous contrast. Angiographic images of the head were obtained  using MRA technique without contrast. COMPARISON:  Head CT same day FINDINGS: MRI HEAD FINDINGS Brain: Diffusion imaging confirms the presence of a 3 cm region of acute infarction in the right superior and lateral cerebellum. Mild swelling but no hemorrhage or mass effect. No other acute infarction. Elsewhere, there chronic small-vessel ischemic changes of the pons. There are moderate chronic small-vessel ischemic changes of the cerebral hemispheric white matter. No large vessel territory infarction. No mass lesion, hemorrhage, hydrocephalus or extra-axial collection. Vascular: Major vessels at the base of the brain show flow. Skull and upper cervical spine: Negative Sinuses/Orbits: Clear/normal Other: None MRA HEAD FINDINGS Both internal carotid arteries are widely patent through the skull base and siphon regions. The anterior and middle cerebral vessels are patent without proximal stenosis, aneurysm or vascular malformation. Both vertebral arteries are patent at the foramen magnum and to the basilar. No basilar stenosis. Posterior inferior cerebellar arteries show flow on each side. Right anterior inferior cerebellar artery is seen. Both superior cerebellar arteries show flow presently. Both posterior cerebral arteries show flow. IMPRESSION: 3 cm region of acute infarction in the right superolateral cerebellum. No hemorrhage or mass effect. Moderate chronic  small-vessel ischemic changes of the pons and cerebral hemispheric white matter. Intracranial MR angiography negative for large or medium vessel occlusion. Specifically, no proximal posterior circulation occlusion seen to explain the infarction. Electronically Signed   By: Nelson Chimes M.D.   On: 03/15/2019 14:56   Ct Abdomen Pelvis W Contrast  Result Date: 03/17/2019 CLINICAL DATA:  Squamous cell carcinoma the lungs. Right cerebellar stroke. EXAM: CT CHEST, ABDOMEN, AND PELVIS WITH CONTRAST TECHNIQUE: Multidetector CT imaging of the chest, abdomen and pelvis was performed following the standard protocol during bolus administration of intravenous contrast. CONTRAST:  29mL OMNIPAQUE IOHEXOL 300 MG/ML  SOLN COMPARISON:  Chest CT from 06/04/2018 and PET-CT from 08/15/2017 FINDINGS: CT CHEST FINDINGS Cardiovascular: Coronary, aortic arch, and branch vessel atherosclerotic vascular disease. Mild cardiomegaly. Interventricular septal thickening suggesting left ventricular hypertrophy. Mediastinum/Nodes: Right paratracheal node 1.0 cm in short axis on image 19/3, formerly 0.6 cm. Additional small right paratracheal lymph nodes are below 1 cm in diameter but mildly increased from prior exam. Subcarinal node 1.1 cm in short axis on image 30/3, formerly 0.5 cm. Bilateral gynecomastia. Lungs/Pleura: Small right pleural effusion. There is complete occlusion of the right upper lobe bronchus. There is atelectasis and possibly some mild alveolar filling airspace opacity posteriorly in the right upper lobe, with aerated anterior portion of the right upper lobe. Airway thickening along the bronchus intermedius resulting in some luminal narrowing, with airway thickening centrally in the right middle lobe and right lower lobe. Superior segment right lower lobe airspace opacity on image 67/5 could be from prior radiation therapy though infection is not excluded. Fiducials are noted in the superior segment right lower lobe. Further  inferiorly in the vicinity of the prior pulmonary nodule, there is indistinct patchy opacity which could be from radiation pneumonitis or pneumonia, for example as shown on image 93/5. The original nodule is not readily apparent against this background density. There is some associated surrounding ground-glass opacity. There is some faint nodularity peripherally in the left upper lobe. On image 62/5, a triangular 0.4 by 0.3 cm nodule is new or significantly increased from prior. Indistinct ground-glass density nodule in the left upper lobe measuring 0.9 cm in diameter on image 78/5 is new. Subsegmental atelectasis is present anteriorly in left lower lobe. Small fiducials are present anteriorly in the left lower lobe  and there are bandlike opacities along the left hemidiaphragm with crowding of bronchovascular structures suggesting some degree of volume loss. The lobulated nodule along the inferior pulmonary ligament is somewhat obscured by surrounding atelectasis, for example on image 114/5, but is less sharply defined and possibly less prominent. There is some additional nodularity in this vicinity, for example posteriorly on image 112/5 where there is a 1.7 by 1.3 cm ill-defined nodular density, probably inflammatory. Musculoskeletal: Thoracic spondylosis. CT ABDOMEN PELVIS FINDINGS Hepatobiliary: Cholecystectomy. The common bile duct measures 1.1 cm in diameter, not changed from 08/15/2017, probably a physiologic response to cholecystectomy. No focal liver lesion is identified. Pancreas: Unremarkable Spleen: Unremarkable Adrenals/Urinary Tract: 1.7 by 1.8 cm left adrenal mass is unchanged in size from 08/15/2017 and was not previously hypermetabolic, hence likely benign. Small hypodense lesions of the left mid kidney right kidney upper pole probably cysts but technically too small to characterize. Considerable vascular calcifications along the renal hila bilaterally. No definite urinary tract calculi.  Stomach/Bowel: Unremarkable Vascular/Lymphatic: Aortoiliac atherosclerotic vascular disease. No pathologic adenopathy identified. Reproductive: Faint calcification in the right posterior prostate gland. Other: No supplemental non-categorized findings. Musculoskeletal: Incidental lipoma in the right vastus lateralis muscle. Degenerative disc disease at L4-5. IMPRESSION: 1. Both the right lower lobe pulmonary nodule on the left lower lobe pulmonary nodule are reduced in conspicuity with some degree of surrounding airspace opacity, possibly radiation pneumonitis if the patient has had interval radiation therapy. 2. Complete occlusion of the right upper lobe bronchus. Most of the right upper lobe is collapsed potentially with a small amount of airspace filling process; there is air trapping in portions of the right upper lobe anteriorly. 3. Airway thickening centrally in the right lung, resulting in some luminal narrowing of the bronchus intermedius. 4. There is a new 4 mm triangular-shaped left upper lobe pulmonary nodule along with a new small ground-glass density nodule in the left upper lobe which merit surveillance. 5. Mildly enlarged paratracheal and subcarinal lymph nodes likewise merits surveillance although could be reactive. 6. Other imaging findings of potential clinical significance: Aortic Atherosclerosis (ICD10-I70.0). Coronary atherosclerosis. Mild cardiomegaly with suggestion of left ventricular hypertrophy. Small right pleural effusion. Chronically stable small left adrenal mass, not previously hypermetabolic on PET-CT and hence likely benign. Electronically Signed   By: Van Clines M.D.   On: 03/17/2019 15:11   Dg Chest Port 1 View  Result Date: 03/15/2019 CLINICAL DATA:  Cough.  History of lung cancer. EXAM: PORTABLE CHEST 1 VIEW COMPARISON:  10/23/2018 and CT 06/04/2018 FINDINGS: Lungs are hypoinflated with worsening masslike opacification over the right suprahilar/paramediastinal region  with mild mixed interstitial airspace density over the more peripheral right upper lobe/apex. No evidence of effusion. Cardiomediastinal silhouette and remainder of the exam is unchanged. IMPRESSION: Mild interval worsening masslike opacity over the right suprahilar/paramediastinal region likely patient's known right lung cancer. Minimal mixed interstitial airspace density more peripherally over the right upper lobe/apex. Electronically Signed   By: Marin Olp M.D.   On: 03/15/2019 11:22   Vas [place] Transcranial Doppler W Bubbles  Result Date: 03/22/2019  Transcranial Doppler with Bubble Indications: Stroke. Performing Technologist: Abram Sander RVS  Examination Guidelines: A complete evaluation includes B-mode imaging, spectral Doppler, color Doppler, and power Doppler as needed of all accessible portions of each vessel. Bilateral testing is considered an integral part of a complete examination. Limited examinations for reoccurring indications may be performed as noted.  Summary:  A vascular evaluation was performed. The left middle cerebral artery was studied. An  IV was inserted into the patient's right forearm. Verbal informed consent was obtained.  Interpretation: HITS heard at rest: None HITS heard during valsalva: None Negative TCD Bubble study *See table(s) above for measurements and observations.  Diagnosing physician: Antony Contras MD Electronically signed by Antony Contras MD on 03/22/2019 at 2:02:31 PM.    Final    Vas [place] Lower Extremity Venous (dvt)  Result Date: 03/25/2019  Lower Venous Study Indications: Assess fr new DVT sine 03/17/2019.  Performing Technologist: Toma Copier RVS  Examination Guidelines: A complete evaluation includes B-mode imaging, spectral Doppler, color Doppler, and power Doppler as needed of all accessible portions of each vessel. Bilateral testing is considered an integral part of a complete examination. Limited examinations for reoccurring indications may be performed  as noted.  +---------+---------------+---------+-----------+----------+-------+  RIGHT     Compressibility Phasicity Spontaneity Properties Summary  +---------+---------------+---------+-----------+----------+-------+  CFV       Full            Yes       Yes                             +---------+---------------+---------+-----------+----------+-------+  SFJ       Full                                                      +---------+---------------+---------+-----------+----------+-------+  FV Prox   Full            Yes       Yes                             +---------+---------------+---------+-----------+----------+-------+  FV Mid    Full                                                      +---------+---------------+---------+-----------+----------+-------+  FV Distal Full            Yes       Yes                             +---------+---------------+---------+-----------+----------+-------+  PFV       Full            Yes       Yes                             +---------+---------------+---------+-----------+----------+-------+  POP       Full            Yes       Yes                             +---------+---------------+---------+-----------+----------+-------+  PTV       Full                                                      +---------+---------------+---------+-----------+----------+-------+  PERO      Full                                                      +---------+---------------+---------+-----------+----------+-------+   +---------+---------------+---------+-----------+----------+------------------+  LEFT      Compressibility Phasicity Spontaneity Properties Summary             +---------+---------------+---------+-----------+----------+------------------+  CFV       Full            Yes       Yes                                        +---------+---------------+---------+-----------+----------+------------------+  SFJ       Full                                                                  +---------+---------------+---------+-----------+----------+------------------+  FV Prox   Full            Yes       Yes                                        +---------+---------------+---------+-----------+----------+------------------+  FV Mid    Full                                                                 +---------+---------------+---------+-----------+----------+------------------+  FV Distal Full            Yes       Yes                                        +---------+---------------+---------+-----------+----------+------------------+  PFV       Full            Yes       Yes                                        +---------+---------------+---------+-----------+----------+------------------+  POP       Full            Yes       Yes                                        +---------+---------------+---------+-----------+----------+------------------+  PTV       Partial                                                              +---------+---------------+---------+-----------+----------+------------------+  PERO      Full                                             Difficult to image  +---------+---------------+---------+-----------+----------+------------------+ There is a partial DVT noted in the mid calf . There is no propagation noted however there appears to be some resolution of the DVT noted 03/17/2019    Summary: Right: There is no evidence of deep vein thrombosis in the lower extremity. No cystic structure found in the popliteal fossa. Left: There is no evidence of deep vein thrombosis in the lower extremity. See technical notes listed above. No cystic structure found in the popliteal fossa.  *See table(s) above for measurements and observations. Electronically signed by Monica Martinez MD on 03/25/2019 at 4:27:15 PM.    Final    Vas [place] Lower Extremity Venous (dvt)  Result Date: 03/18/2019  Lower Venous Study Indications: Stroke.  Performing Technologist: Maudry Mayhew MHA, RDMS,  RVT, RDCS  Examination Guidelines: A complete evaluation includes B-mode imaging, spectral Doppler, color Doppler, and power Doppler as needed of all accessible portions of each vessel. Bilateral testing is considered an integral part of a complete examination. Limited examinations for reoccurring indications may be performed as noted.  +---------+---------------+---------+-----------+----------+-------+  RIGHT     Compressibility Phasicity Spontaneity Properties Summary  +---------+---------------+---------+-----------+----------+-------+  CFV       Full            Yes       Yes                             +---------+---------------+---------+-----------+----------+-------+  SFJ       Full                                                      +---------+---------------+---------+-----------+----------+-------+  FV Prox   Full                                                      +---------+---------------+---------+-----------+----------+-------+  FV Mid    Full                                                      +---------+---------------+---------+-----------+----------+-------+  FV Distal Full                                                      +---------+---------------+---------+-----------+----------+-------+  PFV       Full                                                      +---------+---------------+---------+-----------+----------+-------+  POP       Full            Yes       Yes                             +---------+---------------+---------+-----------+----------+-------+  PTV       Full                                                      +---------+---------------+---------+-----------+----------+-------+  PERO      Full                                                      +---------+---------------+---------+-----------+----------+-------+   +---------+---------------+---------+-----------+----------+-------+  LEFT      Compressibility Phasicity Spontaneity Properties Summary   +---------+---------------+---------+-----------+----------+-------+  CFV       Full            Yes       Yes                             +---------+---------------+---------+-----------+----------+-------+  SFJ       Full                                                      +---------+---------------+---------+-----------+----------+-------+  FV Prox   Full                                                      +---------+---------------+---------+-----------+----------+-------+  FV Mid    Full                                                      +---------+---------------+---------+-----------+----------+-------+  FV Distal Full                                                      +---------+---------------+---------+-----------+----------+-------+  PFV       Full                                                      +---------+---------------+---------+-----------+----------+-------+  POP       Full            Yes       Yes                             +---------+---------------+---------+-----------+----------+-------+  PTV       None                      No                     Acute    +---------+---------------+---------+-----------+----------+-------+  PERO      Full                                                      +---------+---------------+---------+-----------+----------+-------+     Summary: Right: There is no evidence of deep vein thrombosis in the lower extremity. No cystic structure found in the popliteal fossa. Left: Findings consistent with acute deep vein thrombosis involving the left posterior tibial vein. No cystic structure found in the popliteal fossa.  *See table(s) above for measurements and observations. Electronically signed by Servando Snare MD on 03/18/2019 at 4:34:38 PM.    Final      Assessment & Plan:   Hemoptysis - Hemoptysis has resolved, continues to have productive cough with grey sputum  - Check CXR today to evaluate previous seen RUL collapse  - May need bronchoscopy to  evaluate further  - Continue Delsym q12 hrs - Rx Tussionex 2ml once daily prn SEVERE cough (avoid sedating medication/limit amount d/t recent cva)  CVA (cerebral vascular accident) [facility]) - S/p embilic cerebellar infact 03/15/19 - Continue Eliquis and ASA 81 mg daily - Follow with neurology - Refer to speech path for eval    Martyn Ehrich, NP 04/07/2019

## 2019-04-07 NOTE — Telephone Encounter (Signed)
[facility]. They do not open until 0830. Will route message to triage for follow up.

## 2019-04-07 NOTE — Telephone Encounter (Signed)
Please order repeat CT chest w/o contrast in 5 weeks. Also we need CT chest report and hard disc from PCP which should have been completed some time in April.

## 2019-04-07 NOTE — Telephone Encounter (Signed)
Records received from [facility] and copy placed for Dr. Cordelia Pen review. Also left copy for B. Volanda Napoleon, NP who is seeing patient today.  Copy to scan file.

## 2019-04-07 NOTE — Patient Instructions (Addendum)
CXR today  Continue mucinex twice day   Continue Eliquis and ASA  Order speech/ swallow eval   Follow up with Dr. [name] next available (4-8 weeks) OR sooner if you develop blood in mucus

## 2019-04-07 NOTE — Assessment & Plan Note (Signed)
-   S/p embilic cerebellar infact 03/15/19 - Continue Eliquis and ASA 81 mg daily - Follow with neurology - Refer to speech path for eval

## 2019-04-07 NOTE — Progress Notes (Signed)
Subjective:    Patient ID: Travis [name], male    DOB: 06/04/1951, 68 y.o.   MRN: [id number]  HPI: Travis [name] is a 68 y.o. male who is here for Transitional Care Visit for follow up of his Cerebellar Stroke, HTN and Squamous cell carcinoma of bilateral lungs.   He arrived to [facility] ED on 03/15/2019 with complaints of nausea, vomiting,dizziness, he felt off balance and his right arm became weak. Neurology was consulted.   CT Head WO Contrast:  IMPRESSION: 1. Acute to subacute RIGHT cerebellar infarct. No evidence of hemorrhage. 2. Atrophy and chronic small-vessel white matter ischemic changes.  MR Brain WO Contrast: MRA Head WO Contrast IMPRESSION: 3 cm region of acute infarction in the right superolateral cerebellum. No hemorrhage or mass effect.  Moderate chronic small-vessel ischemic changes of the pons and cerebral hemispheric white matter.  Intracranial MR angiography negative for large or medium vessel occlusion. Specifically, no proximal posterior circulation occlusion seen to explain the infarction.  CT Chest W Contrast:CT Abdomen: Pelvis: Pulmonary Consulted. : 1. Both the right lower lobe pulmonary nodule on the left lower lobe pulmonary nodule are reduced in conspicuity with some degree of surrounding airspace opacity, possibly radiation pneumonitis if the patient has had interval radiation therapy. 2. Complete occlusion of the right upper lobe bronchus. Most of the right upper lobe is collapsed potentially with a small amount of airspace filling process; there is air trapping in portions of the right upper lobe anteriorly. 3. Airway thickening centrally in the right lung, resulting in some luminal narrowing of the bronchus intermedius. 4. There is a new 4 mm triangular-shaped left upper lobe pulmonary nodule along with a new small ground-glass density nodule in the left upper lobe which merit surveillance. 5. Mildly enlarged paratracheal and subcarinal lymph  nodes likewise merits surveillance although could be reactive. 6. Other imaging findings of potential clinical significance: Aortic Atherosclerosis (ICD10-I70.0). Coronary atherosclerosis. Mild cardiomegaly with suggestion of left ventricular hypertrophy. Small right pleural effusion. Chronically stable small left adrenal mass, not previously hypermetabolic on PET-CT and hence likely benign.  He was admitted to Inpatient Rehabilitation on 03/19/2019 and discharged on 03/27/2019. He was discharged home with home health therapy with [facility]. He denies pain. He rated his pain 0. Also reports he has a good appetitie.    Pain Inventory Average Pain 0 Pain Right Now 0 My pain is no pain  In the last 24 hours, has pain interfered with the following? General activity 0 Relation with others 0 Enjoyment of life 0 What TIME of day is your pain at its worst? no pain Sleep (in general) Good  Pain is worse with: no pain Pain improves with: no pain Relief from Meds: no pain  Mobility walk without assistance use a walker ability to climb steps?  yes do you drive?  no Do you have any goals in this area?  yes  Function retired I need assistance with the following:  meal prep, household duties and shopping Do you have any goals in this area?  yes  Neuro/Psych trouble walking  Prior Studies transitional care  Physicians involved in your care transitional care   Family History  Problem Relation Age of Onset  . Coronary artery disease Mother   . Heart disease Mother   . Cancer Father   . Stroke Other   . Heart failure Other   . Pulmonary embolism Other   . Diabetes Brother   . Hypertension Brother   .  Heart attack Brother   . Peripheral vascular disease Brother   . Heart disease Brother   . Hypertension Sister    Social History   Socioeconomic History  . Marital status: Married    Spouse name: Not on file  . Number of children: 3  . Years of education: Not on  file  . Highest education level: Not on file  Occupational History  . Not on file  Social Needs  . Financial resource strain: Not hard at all  . Food insecurity:    Worry: Never true    Inability: Never true  . Transportation needs:    Medical: Not on file    Non-medical: Not on file  Tobacco Use  . Smoking status: Former Smoker    Packs/day: 0.50    Years: 50.00    Pack years: 25.00    Types: Cigarettes    Last attempt to quit: 05/09/2015    Years since quitting: 3.9  . Smokeless tobacco: Former Systems developer    Types: [place] date: 2016  Substance and Sexual Activity  . Alcohol use: No    Alcohol/week: 0.0 standard drinks  . Drug use: No  . Sexual activity: Not on file  Lifestyle  . Physical activity:    Days per week: Not on file    Minutes per session: Not on file  . Stress: Not on file  Relationships  . Social connections:    Talks on phone: Not on file    Gets together: Not on file    Attends religious service: Not on file    Active member of club or organization: Not on file    Attends meetings of clubs or organizations: Not on file    Relationship status: Not on file  Other Topics Concern  . Not on file  Social History Narrative   Has a 40-pack-year smoking history and currently smokes 1/2 pack per day.    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   Past Surgical History:  Procedure Laterality Date  . APPENDECTOMY    . CHOLECYSTECTOMY    . CORONARY ANGIOPLASTY     stent times 2, last cath 3 years ago  . FUDUCIAL PLACEMENT N/A 06/09/2018   Procedure: possible, PLACEMENT OF FUDUCIAL;  Surgeon: Grace Isaac, MD;  Location: [place];  Service: Thoracic;  Laterality: N/A;  . NERVE EXPLORATION  07/29/2012   Procedure: NERVE EXPLORATION;  Surgeon: Schuyler Amor, MD;  Location: [place];  Service: Orthopedics;  Laterality: Left;  Exploration /repair nerve and tendon left thumb  . PERIPHERAL VASCULAR CATHETERIZATION N/A  04/26/2015   Procedure: Abdominal Aortogram;  Surgeon: Rosetta Posner, MD;  Location: [facility];  Service: Cardiovascular;  Laterality: N/A;  . VIDEO BRONCHOSCOPY N/A 06/09/2018   Procedure: VIDEO BRONCHOSCOPY;  Surgeon: Grace Isaac, MD;  Location: [place];  Service: Thoracic;  Laterality: N/A;  . VIDEO BRONCHOSCOPY WITH ENDOBRONCHIAL NAVIGATION N/A 06/09/2018   Procedure: VIDEO BRONCHOSCOPY WITH ENDOBRONCHIAL NAVIGATION;  Surgeon: Grace Isaac, MD;  Location: [place];  Service: Thoracic;  Laterality: N/A;   Past Medical History:  Diagnosis Date  . CAD (coronary artery disease)   . Cancer ([place])    skin cancer face  . COPD (chronic obstructive pulmonary disease) ([place])   . Coronary artery disease    PCA 1994l; PCI august 2003 of an obtust marginal. Catheterization at the time reveal: left main normal; LAD 50-75% between first and second diagonals,  50% after lesion; left circumflex 95% in obtuse marg. RCA 100% proximal.  . Diabetes mellitus    type 2  . Hyperlipemia   . Hypertension   . Kidney disease    chronic; stage 2 followed By Dr Deterding  . Myocardial infarction ([place])   . Osteoarthritis   . Peripheral vascular disease ([place])   . Tobacco abuse    BP (!) 93/52   Pulse (!) 58   Resp 14   Ht 5\' 11"  (1.803 m)   Wt 203 lb (92.1 kg)   SpO2 94%   BMI 28.31 kg/m   Opioid Risk Score:   Fall Risk Score:  `1  Depression screen PHQ 2/9  Depression screen PHQ 2/9 04/07/2019  Decreased Interest 0  Down, Depressed, Hopeless 0  PHQ - 2 Score 0  Altered sleeping 0  Tired, decreased energy 0  Change in appetite 0  Feeling bad or failure about yourself  0  Trouble concentrating 0  Moving slowly or fidgety/restless 0  Suicidal thoughts 0  PHQ-9 Score 0    Review of Systems  Constitutional: Negative.   HENT: Negative.   Eyes: Negative.   Respiratory: Positive for cough and wheezing.   Cardiovascular: Negative.   Gastrointestinal: Negative.   Endocrine:        High blood sugar  Genitourinary: Negative.   Musculoskeletal: Positive for back pain.  Neurological: Negative.   Hematological: Bruises/bleeds easily.  Psychiatric/Behavioral: Negative.   All other systems reviewed and are negative.      Objective:   Physical Exam Vitals signs and nursing note reviewed.  Constitutional:      Appearance: Normal appearance.  Neck:     Musculoskeletal: Normal range of motion and neck supple.  Cardiovascular:     Rate and Rhythm: Normal rate and regular rhythm.     Pulses: Normal pulses.     Heart sounds: Normal heart sounds.  Pulmonary:     Effort: Pulmonary effort is normal.     Breath sounds: Normal breath sounds.  Musculoskeletal:     Comments: Normal Muscle Bulk and Muscle Testing Reveals:  Upper Extremities: Full ROM and Muscle Strength 4/5 Lower Extremities : Full ROM and Muscle Strength 5/5  Arises from chair with ease using walker for support Narrow Based Gait   Neurological:     Mental Status: He is alert and oriented to person, place, and time.  Psychiatric:        Mood and Affect: Mood normal.        Behavior: Behavior normal.           Assessment & Plan:  1. Cerebellar Stroke: Continue current medication regimen. Continue Home Health Therapy. Has a scheduled appointment with Neurology.  2. Squamous Cell Carcinoma: Pulmonary and Oncology Following.  3. Hypertension: Continue current medication regimen.   20  minutes of face to face patient care time was spent during this visit. All questions were encouraged and answered.  F/U with Dr. Letta Pate in 4-6 weeks

## 2019-04-07 NOTE — Telephone Encounter (Signed)
Left detailed message for Travis [name].

## 2019-04-07 NOTE — Assessment & Plan Note (Addendum)
-   Hemoptysis has resolved, continues to have productive cough with grey sputum  - Check CXR today to evaluate previous seen RUL collapse  - May need bronchoscopy to evaluate further  - Continue Delsym q12 hrs - Rx Tussionex 72ml once daily prn SEVERE cough (avoid sedating medication/limit amount d/t recent cva)

## 2019-04-08 ENCOUNTER — Telehealth: Payer: Self-pay | Admitting: Primary Care

## 2019-04-08 DIAGNOSIS — I69351 Hemiplegia and hemiparesis following cerebral infarction affecting right dominant side: Secondary | ICD-10-CM | POA: Diagnosis not present

## 2019-04-08 DIAGNOSIS — I2510 Atherosclerotic heart disease of native coronary artery without angina pectoris: Secondary | ICD-10-CM | POA: Diagnosis not present

## 2019-04-08 DIAGNOSIS — I129 Hypertensive chronic kidney disease with stage 1 through stage 4 chronic kidney disease, or unspecified chronic kidney disease: Secondary | ICD-10-CM | POA: Diagnosis not present

## 2019-04-08 DIAGNOSIS — E1151 Type 2 diabetes mellitus with diabetic peripheral angiopathy without gangrene: Secondary | ICD-10-CM | POA: Diagnosis not present

## 2019-04-08 DIAGNOSIS — N183 Chronic kidney disease, stage 3 (moderate): Secondary | ICD-10-CM | POA: Diagnosis not present

## 2019-04-08 DIAGNOSIS — E1122 Type 2 diabetes mellitus with diabetic chronic kidney disease: Secondary | ICD-10-CM | POA: Diagnosis not present

## 2019-04-08 NOTE — Telephone Encounter (Signed)
Called and spoke with pt's wife Otila Kluver letting her know the info stated by [facility]. Otila Kluver expressed understanding. Nothing further needed.

## 2019-04-08 NOTE — Telephone Encounter (Signed)
Primary Pulmonologist: JE Last office visit and with whom: 04/07/19 What do we see them for (pulmonary problems): COPD, DVT Last OV assessment/plan:  CXR today  Continue mucinex twice day   Continue Eliquis and ASA  Order speech/ swallow eval   Follow up with Dr. [name] next available (4-8 weeks) OR sooner if you develop blood in mucus      Was appointment offered to patient (explain)?  No    Reason for call: Spoke with patient and his Otila Kluver on the phone. Patient stated that around midnight, he had a coughing spell. He was able to cough up mucus and noticed dark red blood in his mucus. Per patient, it was a small amount, about the size of a pea. Patient stated that he woke up this morning and had another coughing spell and coughed up bright red blood.   Patient is concerned because he was recently in the hospital for this.   Denied any SOB, chest pain. Denied any exposures to COVID.   Pharmacy is [facility] on [address].   Beth, please advise. Thanks!

## 2019-04-08 NOTE — Telephone Encounter (Addendum)
Spoke with Dr [name]'s office.  CT done on 03/01/19.  Sending results.  Will follow up about hard copy. Spoke with pt and relayed the order for CT scan in 5 weeks.  Nothing further is needed.

## 2019-04-08 NOTE — Telephone Encounter (Signed)
Stop mucinex. Continue Tussionex for cough suppression (sent to pharmacy). Monitor amount if > 256ml per day notify office or present to ED.

## 2019-04-09 DIAGNOSIS — E1151 Type 2 diabetes mellitus with diabetic peripheral angiopathy without gangrene: Secondary | ICD-10-CM | POA: Diagnosis not present

## 2019-04-09 DIAGNOSIS — I69351 Hemiplegia and hemiparesis following cerebral infarction affecting right dominant side: Secondary | ICD-10-CM | POA: Diagnosis not present

## 2019-04-09 DIAGNOSIS — I2510 Atherosclerotic heart disease of native coronary artery without angina pectoris: Secondary | ICD-10-CM | POA: Diagnosis not present

## 2019-04-09 DIAGNOSIS — I129 Hypertensive chronic kidney disease with stage 1 through stage 4 chronic kidney disease, or unspecified chronic kidney disease: Secondary | ICD-10-CM | POA: Diagnosis not present

## 2019-04-09 DIAGNOSIS — N183 Chronic kidney disease, stage 3 (moderate): Secondary | ICD-10-CM | POA: Diagnosis not present

## 2019-04-09 DIAGNOSIS — E1122 Type 2 diabetes mellitus with diabetic chronic kidney disease: Secondary | ICD-10-CM | POA: Diagnosis not present

## 2019-04-12 DIAGNOSIS — I2510 Atherosclerotic heart disease of native coronary artery without angina pectoris: Secondary | ICD-10-CM | POA: Diagnosis not present

## 2019-04-12 DIAGNOSIS — E1151 Type 2 diabetes mellitus with diabetic peripheral angiopathy without gangrene: Secondary | ICD-10-CM | POA: Diagnosis not present

## 2019-04-12 DIAGNOSIS — I129 Hypertensive chronic kidney disease with stage 1 through stage 4 chronic kidney disease, or unspecified chronic kidney disease: Secondary | ICD-10-CM | POA: Diagnosis not present

## 2019-04-12 DIAGNOSIS — E1122 Type 2 diabetes mellitus with diabetic chronic kidney disease: Secondary | ICD-10-CM | POA: Diagnosis not present

## 2019-04-12 DIAGNOSIS — N183 Chronic kidney disease, stage 3 (moderate): Secondary | ICD-10-CM | POA: Diagnosis not present

## 2019-04-12 DIAGNOSIS — I69351 Hemiplegia and hemiparesis following cerebral infarction affecting right dominant side: Secondary | ICD-10-CM | POA: Diagnosis not present

## 2019-04-13 DIAGNOSIS — I129 Hypertensive chronic kidney disease with stage 1 through stage 4 chronic kidney disease, or unspecified chronic kidney disease: Secondary | ICD-10-CM | POA: Diagnosis not present

## 2019-04-13 DIAGNOSIS — N183 Chronic kidney disease, stage 3 (moderate): Secondary | ICD-10-CM | POA: Diagnosis not present

## 2019-04-13 DIAGNOSIS — E1122 Type 2 diabetes mellitus with diabetic chronic kidney disease: Secondary | ICD-10-CM | POA: Diagnosis not present

## 2019-04-13 DIAGNOSIS — E1151 Type 2 diabetes mellitus with diabetic peripheral angiopathy without gangrene: Secondary | ICD-10-CM | POA: Diagnosis not present

## 2019-04-13 DIAGNOSIS — I69351 Hemiplegia and hemiparesis following cerebral infarction affecting right dominant side: Secondary | ICD-10-CM | POA: Diagnosis not present

## 2019-04-13 DIAGNOSIS — I2510 Atherosclerotic heart disease of native coronary artery without angina pectoris: Secondary | ICD-10-CM | POA: Diagnosis not present

## 2019-04-14 ENCOUNTER — Inpatient Hospital Stay: Payer: Medicare Other | Attending: Internal Medicine | Admitting: Internal Medicine

## 2019-04-14 ENCOUNTER — Telehealth: Payer: Self-pay

## 2019-04-14 ENCOUNTER — Encounter: Payer: Self-pay | Admitting: Internal Medicine

## 2019-04-14 ENCOUNTER — Other Ambulatory Visit: Payer: Self-pay

## 2019-04-14 ENCOUNTER — Telehealth: Payer: Self-pay | Admitting: Primary Care

## 2019-04-14 VITALS — BP 147/70 | HR 58 | Temp 98.9°F | Resp 20 | Ht 71.0 in | Wt 205.9 lb

## 2019-04-14 DIAGNOSIS — J90 Pleural effusion, not elsewhere classified: Secondary | ICD-10-CM | POA: Diagnosis not present

## 2019-04-14 DIAGNOSIS — E279 Disorder of adrenal gland, unspecified: Secondary | ICD-10-CM | POA: Insufficient documentation

## 2019-04-14 DIAGNOSIS — N62 Hypertrophy of breast: Secondary | ICD-10-CM | POA: Diagnosis not present

## 2019-04-14 DIAGNOSIS — I739 Peripheral vascular disease, unspecified: Secondary | ICD-10-CM | POA: Insufficient documentation

## 2019-04-14 DIAGNOSIS — N182 Chronic kidney disease, stage 2 (mild): Secondary | ICD-10-CM | POA: Diagnosis not present

## 2019-04-14 DIAGNOSIS — Z86718 Personal history of other venous thrombosis and embolism: Secondary | ICD-10-CM | POA: Insufficient documentation

## 2019-04-14 DIAGNOSIS — J449 Chronic obstructive pulmonary disease, unspecified: Secondary | ICD-10-CM | POA: Insufficient documentation

## 2019-04-14 DIAGNOSIS — Z79899 Other long term (current) drug therapy: Secondary | ICD-10-CM | POA: Diagnosis not present

## 2019-04-14 DIAGNOSIS — I129 Hypertensive chronic kidney disease with stage 1 through stage 4 chronic kidney disease, or unspecified chronic kidney disease: Secondary | ICD-10-CM | POA: Insufficient documentation

## 2019-04-14 DIAGNOSIS — M1990 Unspecified osteoarthritis, unspecified site: Secondary | ICD-10-CM | POA: Insufficient documentation

## 2019-04-14 DIAGNOSIS — N183 Chronic kidney disease, stage 3 (moderate): Secondary | ICD-10-CM | POA: Diagnosis not present

## 2019-04-14 DIAGNOSIS — R911 Solitary pulmonary nodule: Secondary | ICD-10-CM | POA: Diagnosis not present

## 2019-04-14 DIAGNOSIS — R05 Cough: Secondary | ICD-10-CM | POA: Diagnosis not present

## 2019-04-14 DIAGNOSIS — E119 Type 2 diabetes mellitus without complications: Secondary | ICD-10-CM | POA: Insufficient documentation

## 2019-04-14 DIAGNOSIS — F17210 Nicotine dependence, cigarettes, uncomplicated: Secondary | ICD-10-CM | POA: Insufficient documentation

## 2019-04-14 DIAGNOSIS — I252 Old myocardial infarction: Secondary | ICD-10-CM | POA: Diagnosis not present

## 2019-04-14 DIAGNOSIS — E785 Hyperlipidemia, unspecified: Secondary | ICD-10-CM | POA: Insufficient documentation

## 2019-04-14 DIAGNOSIS — Z7901 Long term (current) use of anticoagulants: Secondary | ICD-10-CM | POA: Diagnosis not present

## 2019-04-14 DIAGNOSIS — I10 Essential (primary) hypertension: Secondary | ICD-10-CM | POA: Diagnosis not present

## 2019-04-14 DIAGNOSIS — D6851 Activated protein C resistance: Secondary | ICD-10-CM | POA: Insufficient documentation

## 2019-04-14 DIAGNOSIS — I2510 Atherosclerotic heart disease of native coronary artery without angina pectoris: Secondary | ICD-10-CM | POA: Insufficient documentation

## 2019-04-14 DIAGNOSIS — Z794 Long term (current) use of insulin: Secondary | ICD-10-CM | POA: Insufficient documentation

## 2019-04-14 DIAGNOSIS — Z7982 Long term (current) use of aspirin: Secondary | ICD-10-CM | POA: Insufficient documentation

## 2019-04-14 DIAGNOSIS — E1151 Type 2 diabetes mellitus with diabetic peripheral angiopathy without gangrene: Secondary | ICD-10-CM | POA: Diagnosis not present

## 2019-04-14 DIAGNOSIS — E1122 Type 2 diabetes mellitus with diabetic chronic kidney disease: Secondary | ICD-10-CM | POA: Diagnosis not present

## 2019-04-14 DIAGNOSIS — M5136 Other intervertebral disc degeneration, lumbar region: Secondary | ICD-10-CM | POA: Diagnosis not present

## 2019-04-14 DIAGNOSIS — Z86711 Personal history of pulmonary embolism: Secondary | ICD-10-CM | POA: Diagnosis not present

## 2019-04-14 DIAGNOSIS — I69351 Hemiplegia and hemiparesis following cerebral infarction affecting right dominant side: Secondary | ICD-10-CM | POA: Diagnosis not present

## 2019-04-14 DIAGNOSIS — R042 Hemoptysis: Secondary | ICD-10-CM

## 2019-04-14 DIAGNOSIS — I825Z2 Chronic embolism and thrombosis of unspecified deep veins of left distal lower extremity: Secondary | ICD-10-CM

## 2019-04-14 MED ORDER — LEVOFLOXACIN 500 MG PO TABS: 500.0000 mg | ORAL_TABLET | Freq: Every day | ORAL | 0 refills | Status: AC

## 2019-04-14 NOTE — Telephone Encounter (Signed)
Called & spoke to pt's wife, Otila Kluver, regarding Beth's recommendations. Pt wife verbalized understanding with no additional questions. Pt wife will be coming by tomorrow to collect a sputum cup from [place] to take home for pt and will return it Friday 04/16/2019 for lab collection.   Levaquin has been sent to pt's verified pharmacy per [facility]. Order for sputum culture has been placed. 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 Nothing further needed at this time.

## 2019-04-14 NOTE — Telephone Encounter (Signed)
ATC patient's Wife, Otila Kluver. Left message to call back.

## 2019-04-14 NOTE — Telephone Encounter (Signed)
-----   Message from [place], MD sent at 04/07/2019  1:21 PM EDT ----- Regarding: RE: patient of yours i saw for hosp folow-up I need his CT report and disc from his PCP . Image was reportedly completed in  April. We were considering doing a bronch if needed.   In the meantime, I would like a follow-up CT 8 weeks from his most recent one. ----- Message ----- From: Martyn Ehrich, NP Sent: 04/07/2019   1:15 PM EDT To: Margaretha Seeds, MD Subject: RE: patient of yours i saw for hosp folow-up   Sorry, I didn't attach  Duaine Dredge [id number] ----- Message ----- From: Margaretha Seeds, MD Sent: 04/07/2019   1:12 PM EDT To: Martyn Ehrich, NP Subject: RE: patient of yours i saw for hosp folow-up   What is the patients name and mrn? ----- Message ----- From: Martyn Ehrich, NP Sent: 04/07/2019   1:00 PM EDT To: Margaretha Seeds, MD Subject: patient of yours i saw for hosp folow-up       Patient you saw in the hospital as consult for new hemoptysis. Patient has history NSCLC with recent CVA on Eliquis and ASA. He is doing well, no further hemoptysis. Continue to have congested cough with green/grey sputum twice daily.   CXR today showed Patchy right upper lung consolidation is decreased suggesting improving pneumonia, with underlying chronic right upper lung parenchymal band. Stable mild hazy right lung base opacity, favor scarring or atelectasis.  There was question on whether he needed a bronch to eval RUL collapse. Since hemoptysis has resolved and CXR is improved do you want do continue to monitor? Should we get another CXR or CT scan in a few weeks?   Thanks [employer]

## 2019-04-14 NOTE — Telephone Encounter (Signed)
There is a chest CT with contrast in canopy from 03/17/2019.

## 2019-04-14 NOTE — Telephone Encounter (Signed)
Will send in levaquin. Needs sputum culture before starting antibiotic preferably. Continue mucinex twice daily. Tylenol prn fever.

## 2019-04-14 NOTE — Progress Notes (Signed)
[place] [phone]   Fax:([phone]) [place], [place] [address]  DIAGNOSIS: History of pulmonary embolism with recurrent deep venous thrombosis with heterozygous factor V Leiden.  PRIOR THERAPY: None  CURRENT THERAPY: Eliquis 5 mg p.o. twice daily  INTERVAL HISTORY: Travis [name] 68 y.o. male returns to the clinic today for hospital follow-up visit.  The patient is feeling fine today with no concerning complaints except for persistent cough.  He is followed by Dr. [name] with pulmonary medicine.  The patient denied having any current chest pain, shortness of breath or hemoptysis.  He denied having any weight loss or weight.  He has been monitoring from vomiting, diarrhea or constipation.  He was found during recent hospitalization to have recurrent deep venous thrombosis involving the left posterior tibial vein on ultrasound Doppler of the lower extremity on 03/17/2019 while on treatment with Eliquis.  Repeat bilateral lower extremity Doppler on 03/25/2019 showed no evidence of deep venous thrombosis in the lower extremities.  The patient had hypercoagulable panel performed during his hospitalization and it showed heterozygous factor V Leiden.  He continues his current treatment with Eliquis with the same dose.  He is here today for reevaluation.  MEDICAL HISTORY: Past Medical History:  Diagnosis Date   CAD (coronary artery disease)    Cancer ([place])    skin cancer face   COPD (chronic obstructive pulmonary disease) ([place])    Coronary artery disease    PCA 1994l; PCI august 2003 of an obtust marginal. Catheterization at the time reveal: left main normal; LAD 50-75% between first and second diagonals, 50% after lesion; left circumflex 95% in obtuse marg. RCA 100% proximal.   Diabetes mellitus    type 2   Hyperlipemia    Hypertension    Kidney disease    chronic; stage 2 followed By Dr Deterding    Myocardial infarction [facility])    Osteoarthritis    Peripheral vascular disease ([place])    Tobacco abuse     ALLERGIES:  is allergic to penicillins.  MEDICATIONS:  Current Outpatient Medications  Medication Sig Dispense Refill   acetaminophen (TYLENOL) 325 MG tablet Take 2 tablets (650 mg total) by mouth every 4 (four) hours as needed for mild pain (or temp > 37.5 C (99.5 F)).     albuterol (PROAIR HFA) 108 (90 Base) MCG/ACT inhaler Inhale 2 puffs into the lungs every 6 (six) hours as needed for wheezing or shortness of breath. 1 Inhaler 0   allopurinol (ZYLOPRIM) 100 MG tablet Take 1 tablet (100 mg total) by mouth at bedtime. 30 tablet 0   amLODipine (NORVASC) 10 MG tablet Take 1 tablet (10 mg total) by mouth daily. 30 tablet 0   apixaban (ELIQUIS) 5 MG TABS tablet Take 1 tablet (5 mg total) by mouth 2 (two) times daily. 60 tablet 1   aspirin EC 81 MG tablet Take 1 tablet (81 mg total) by mouth daily. 90 tablet 3   benazepril (LOTENSIN) 40 MG tablet Take 1 tablet (40 mg total) by mouth at bedtime. 30 tablet 0   benzonatate (TESSALON) 100 MG capsule Take 1 capsule (100 mg total) by mouth 3 (three) times daily as needed for cough. 20 capsule 0   calcitRIOL (ROCALTROL) 0.25 MCG capsule Take 1 capsule (0.25 mcg total) by mouth daily. 30 capsule 6   chlorpheniramine-HYDROcodone (TUSSIONEX PENNKINETIC ER) 10-8 MG/5ML SUER Take 5 mLs by mouth 2 (two)  times daily. 240 mL 0   Cinnamon 500 MG capsule Take 500 mg by mouth daily.     dextromethorphan (DELSYM) 30 MG/5ML liquid Take 2.5 mLs (15 mg total) by mouth 2 (two) times daily. 89 mL 0   guaiFENesin (MUCINEX) 600 MG 12 hr tablet Take 1 tablet (600 mg total) by mouth 2 (two) times daily. 30 tablet 0   Insulin Glargine, 2 Unit Dial, (TOUJEO MAX SOLOSTAR) 300 UNIT/ML SOPN Inject 28 Units/day into the skin every morning.     Insulin Glargine, 2 Unit Dial, (TOUJEO MAX SOLOSTAR) 300 UNIT/ML SOPN Inject 11 Units/day into the skin every  evening.     metoprolol tartrate (LOPRESSOR) 100 MG tablet Take 1 tablet (100 mg total) by mouth 2 (two) times daily. 60 tablet 0   mirtazapine (REMERON) 15 MG tablet Take 1 tablet (15 mg total) by mouth at bedtime. 30 tablet 0   No current facility-administered medications for this visit.     SURGICAL HISTORY:  Past Surgical History:  Procedure Laterality Date   APPENDECTOMY     CHOLECYSTECTOMY     CORONARY ANGIOPLASTY     stent times 2, last cath 3 years ago   [place] N/A 06/09/2018   Procedure: possible, PLACEMENT OF FUDUCIAL;  Surgeon: Grace Isaac, MD;  Location: [place];  Service: Thoracic;  Laterality: N/A;   NERVE EXPLORATION  07/29/2012   Procedure: NERVE EXPLORATION;  Surgeon: Schuyler Amor, MD;  Location: [place];  Service: Orthopedics;  Laterality: Left;  Exploration /repair nerve and tendon left thumb   PERIPHERAL VASCULAR CATHETERIZATION N/A 04/26/2015   Procedure: Abdominal Aortogram;  Surgeon: Rosetta Posner, MD;  Location: [place] [facility];  Service: Cardiovascular;  Laterality: N/A;   VIDEO BRONCHOSCOPY N/A 06/09/2018   Procedure: VIDEO BRONCHOSCOPY;  Surgeon: Grace Isaac, MD;  Location: [place];  Service: Thoracic;  Laterality: N/A;   VIDEO BRONCHOSCOPY WITH ENDOBRONCHIAL NAVIGATION N/A 06/09/2018   Procedure: VIDEO BRONCHOSCOPY WITH ENDOBRONCHIAL NAVIGATION;  Surgeon: Grace Isaac, MD;  Location: [place];  Service: Thoracic;  Laterality: N/A;    REVIEW OF SYSTEMS:  A comprehensive review of systems was negative except for: Respiratory: positive for cough   PHYSICAL EXAMINATION: General appearance: alert, cooperative and no distress Head: Normocephalic, without obvious abnormality, atraumatic Neck: no adenopathy, no JVD, supple, symmetrical, trachea midline and thyroid not enlarged, symmetric, no tenderness/mass/nodules Lymph nodes: Cervical, supraclavicular, and axillary nodes normal. Resp: clear to auscultation  bilaterally Back: symmetric, no curvature. ROM normal. No CVA tenderness. Cardio: regular rate and rhythm, S1, S2 normal, no murmur, click, rub or gallop GI: soft, non-tender; bowel sounds normal; no masses,  no organomegaly Extremities: extremities normal, atraumatic, no cyanosis or edema  ECOG PERFORMANCE STATUS: 1 - Symptomatic but completely ambulatory  Blood pressure (!) 147/70, pulse (!) 58, temperature 98.9 F (37.2 C), temperature source Oral, resp. rate 20, height 5\' 11"  (1.803 m), weight 205 lb 14.4 oz (93.4 kg), SpO2 95 %.  LABORATORY DATA: Lab Results  Component Value Date   WBC 8.0 03/22/2019   HGB 13.6 03/22/2019   HCT 45.0 03/22/2019   MCV 86.4 03/22/2019   PLT 275 03/22/2019      Chemistry      Component Value Date/Time   NA 141 03/22/2019 0702   K 5.0 03/22/2019 0702   CL 109 03/22/2019 0702   CO2 25 03/22/2019 0702   BUN 27 (H) 03/22/2019 0702   CREATININE 1.30 (H) 03/22/2019 7494  CREATININE 2.20 (H) 03/29/2015 1130      Component Value Date/Time   CALCIUM 9.1 03/22/2019 0702   ALKPHOS 61 03/22/2019 0702   AST 15 03/22/2019 0702   ALT 13 03/22/2019 0702   BILITOT 0.5 03/22/2019 9622       RADIOGRAPHIC STUDIES: Dg Chest 2 View  Result Date: 04/07/2019 CLINICAL DATA:  Follow-up pneumonia, history of lung cancer EXAM: CHEST - 2 VIEW COMPARISON:  03/15/2019 chest radiograph. FINDINGS: Fiducial markers overlie right mid and left lower lungs. Stable cardiomediastinal silhouette with normal heart size. No pneumothorax. No pleural effusion. Patchy right upper lobe consolidation is decreased, with underlying chronic right upper lung parenchymal band. Stable mild hazy right lung base opacity. Clear left lung. IMPRESSION: Patchy right upper lung consolidation is decreased suggesting improving pneumonia, with underlying chronic right upper lung parenchymal band. Stable mild hazy right lung base opacity, favor scarring or atelectasis. Electronically Signed   By:  Ilona Sorrel M.D.   On: 04/07/2019 12:44   Ct Head Wo Contrast  Result Date: 03/15/2019 CLINICAL DATA:  68 year old male with acute dizziness and ataxia. EXAM: CT HEAD WITHOUT CONTRAST TECHNIQUE: Contiguous axial images were obtained from the base of the skull through the vertex without intravenous contrast. COMPARISON:  None. FINDINGS: Brain: An infarct within the SUPERIOR RIGHT cerebellum appears acute or subacute. There is no evidence of hemorrhage. Mild atrophy and chronic small-vessel white matter ischemic changes are noted. No mass lesion, midline shift, hydrocephalus or extra-axial collection noted. Vascular: Carotid and vertebral atherosclerotic calcifications noted. Skull: Normal. Negative for fracture or focal lesion. Sinuses/Orbits: No acute finding. Other: None. IMPRESSION: 1. Acute to subacute RIGHT cerebellar infarct. No evidence of hemorrhage. 2. Atrophy and chronic small-vessel white matter ischemic changes. Electronically Signed   By: Margarette Canada M.D.   On: 03/15/2019 12:49   Ct Chest W Contrast  Result Date: 03/17/2019 CLINICAL DATA:  Squamous cell carcinoma the lungs. Right cerebellar stroke. EXAM: CT CHEST, ABDOMEN, AND PELVIS WITH CONTRAST TECHNIQUE: Multidetector CT imaging of the chest, abdomen and pelvis was performed following the standard protocol during bolus administration of intravenous contrast. CONTRAST:  52mL OMNIPAQUE IOHEXOL 300 MG/ML  SOLN COMPARISON:  Chest CT from 06/04/2018 and PET-CT from 08/15/2017 FINDINGS: CT CHEST FINDINGS Cardiovascular: Coronary, aortic arch, and branch vessel atherosclerotic vascular disease. Mild cardiomegaly. Interventricular septal thickening suggesting left ventricular hypertrophy. Mediastinum/Nodes: Right paratracheal node 1.0 cm in short axis on image 19/3, formerly 0.6 cm. Additional small right paratracheal lymph nodes are below 1 cm in diameter but mildly increased from prior exam. Subcarinal node 1.1 cm in short axis on image 30/3,  formerly 0.5 cm. Bilateral gynecomastia. Lungs/Pleura: Small right pleural effusion. There is complete occlusion of the right upper lobe bronchus. There is atelectasis and possibly some mild alveolar filling airspace opacity posteriorly in the right upper lobe, with aerated anterior portion of the right upper lobe. Airway thickening along the bronchus intermedius resulting in some luminal narrowing, with airway thickening centrally in the right middle lobe and right lower lobe. Superior segment right lower lobe airspace opacity on image 67/5 could be from prior radiation therapy though infection is not excluded. Fiducials are noted in the superior segment right lower lobe. Further inferiorly in the vicinity of the prior pulmonary nodule, there is indistinct patchy opacity which could be from radiation pneumonitis or pneumonia, for example as shown on image 93/5. The original nodule is not readily apparent against this background density. There is some associated surrounding ground-glass opacity. There is  some faint nodularity peripherally in the left upper lobe. On image 62/5, a triangular 0.4 by 0.3 cm nodule is new or significantly increased from prior. Indistinct ground-glass density nodule in the left upper lobe measuring 0.9 cm in diameter on image 78/5 is new. Subsegmental atelectasis is present anteriorly in left lower lobe. Small fiducials are present anteriorly in the left lower lobe and there are bandlike opacities along the left hemidiaphragm with crowding of bronchovascular structures suggesting some degree of volume loss. The lobulated nodule along the inferior pulmonary ligament is somewhat obscured by surrounding atelectasis, for example on image 114/5, but is less sharply defined and possibly less prominent. There is some additional nodularity in this vicinity, for example posteriorly on image 112/5 where there is a 1.7 by 1.3 cm ill-defined nodular density, probably inflammatory. Musculoskeletal:  Thoracic spondylosis. CT ABDOMEN PELVIS FINDINGS Hepatobiliary: Cholecystectomy. The common bile duct measures 1.1 cm in diameter, not changed from 08/15/2017, probably a physiologic response to cholecystectomy. No focal liver lesion is identified. Pancreas: Unremarkable Spleen: Unremarkable Adrenals/Urinary Tract: 1.7 by 1.8 cm left adrenal mass is unchanged in size from 08/15/2017 and was not previously hypermetabolic, hence likely benign. Small hypodense lesions of the left mid kidney right kidney upper pole probably cysts but technically too small to characterize. Considerable vascular calcifications along the renal hila bilaterally. No definite urinary tract calculi. Stomach/Bowel: Unremarkable Vascular/Lymphatic: Aortoiliac atherosclerotic vascular disease. No pathologic adenopathy identified. Reproductive: Faint calcification in the right posterior prostate gland. Other: No supplemental non-categorized findings. Musculoskeletal: Incidental lipoma in the right vastus lateralis muscle. Degenerative disc disease at L4-5. IMPRESSION: 1. Both the right lower lobe pulmonary nodule on the left lower lobe pulmonary nodule are reduced in conspicuity with some degree of surrounding airspace opacity, possibly radiation pneumonitis if the patient has had interval radiation therapy. 2. Complete occlusion of the right upper lobe bronchus. Most of the right upper lobe is collapsed potentially with a small amount of airspace filling process; there is air trapping in portions of the right upper lobe anteriorly. 3. Airway thickening centrally in the right lung, resulting in some luminal narrowing of the bronchus intermedius. 4. There is a new 4 mm triangular-shaped left upper lobe pulmonary nodule along with a new small ground-glass density nodule in the left upper lobe which merit surveillance. 5. Mildly enlarged paratracheal and subcarinal lymph nodes likewise merits surveillance although could be reactive. 6. Other imaging  findings of potential clinical significance: Aortic Atherosclerosis (ICD10-I70.0). Coronary atherosclerosis. Mild cardiomegaly with suggestion of left ventricular hypertrophy. Small right pleural effusion. Chronically stable small left adrenal mass, not previously hypermetabolic on PET-CT and hence likely benign. Electronically Signed   By: Van Clines M.D.   On: 03/17/2019 15:11   Mr Jodene Nam Head Wo Contrast  Result Date: 03/15/2019 CLINICAL DATA:  Dizziness.  Abnormal cord in a shin. EXAM: MRI HEAD WITHOUT CONTRAST MRA HEAD WITHOUT CONTRAST TECHNIQUE: Multiplanar, multiecho pulse sequences of the brain and surrounding structures were obtained without intravenous contrast. Angiographic images of the head were obtained using MRA technique without contrast. COMPARISON:  Head CT same day FINDINGS: MRI HEAD FINDINGS Brain: Diffusion imaging confirms the presence of a 3 cm region of acute infarction in the right superior and lateral cerebellum. Mild swelling but no hemorrhage or mass effect. No other acute infarction. Elsewhere, there chronic small-vessel ischemic changes of the pons. There are moderate chronic small-vessel ischemic changes of the cerebral hemispheric white matter. No large vessel territory infarction. No mass lesion, hemorrhage, hydrocephalus or extra-axial  collection. Vascular: Major vessels at the base of the brain show flow. Skull and upper cervical spine: Negative Sinuses/Orbits: Clear/normal Other: None MRA HEAD FINDINGS Both internal carotid arteries are widely patent through the skull base and siphon regions. The anterior and middle cerebral vessels are patent without proximal stenosis, aneurysm or vascular malformation. Both vertebral arteries are patent at the foramen magnum and to the basilar. No basilar stenosis. Posterior inferior cerebellar arteries show flow on each side. Right anterior inferior cerebellar artery is seen. Both superior cerebellar arteries show flow presently. Both  posterior cerebral arteries show flow. IMPRESSION: 3 cm region of acute infarction in the right superolateral cerebellum. No hemorrhage or mass effect. Moderate chronic small-vessel ischemic changes of the pons and cerebral hemispheric white matter. Intracranial MR angiography negative for large or medium vessel occlusion. Specifically, no proximal posterior circulation occlusion seen to explain the infarction. Electronically Signed   By: Nelson Chimes M.D.   On: 03/15/2019 14:56   Mr Brain Wo Contrast  Result Date: 03/15/2019 CLINICAL DATA:  Dizziness.  Abnormal cord in a shin. EXAM: MRI HEAD WITHOUT CONTRAST MRA HEAD WITHOUT CONTRAST TECHNIQUE: Multiplanar, multiecho pulse sequences of the brain and surrounding structures were obtained without intravenous contrast. Angiographic images of the head were obtained using MRA technique without contrast. COMPARISON:  Head CT same day FINDINGS: MRI HEAD FINDINGS Brain: Diffusion imaging confirms the presence of a 3 cm region of acute infarction in the right superior and lateral cerebellum. Mild swelling but no hemorrhage or mass effect. No other acute infarction. Elsewhere, there chronic small-vessel ischemic changes of the pons. There are moderate chronic small-vessel ischemic changes of the cerebral hemispheric white matter. No large vessel territory infarction. No mass lesion, hemorrhage, hydrocephalus or extra-axial collection. Vascular: Major vessels at the base of the brain show flow. Skull and upper cervical spine: Negative Sinuses/Orbits: Clear/normal Other: None MRA HEAD FINDINGS Both internal carotid arteries are widely patent through the skull base and siphon regions. The anterior and middle cerebral vessels are patent without proximal stenosis, aneurysm or vascular malformation. Both vertebral arteries are patent at the foramen magnum and to the basilar. No basilar stenosis. Posterior inferior cerebellar arteries show flow on each side. Right anterior  inferior cerebellar artery is seen. Both superior cerebellar arteries show flow presently. Both posterior cerebral arteries show flow. IMPRESSION: 3 cm region of acute infarction in the right superolateral cerebellum. No hemorrhage or mass effect. Moderate chronic small-vessel ischemic changes of the pons and cerebral hemispheric white matter. Intracranial MR angiography negative for large or medium vessel occlusion. Specifically, no proximal posterior circulation occlusion seen to explain the infarction. Electronically Signed   By: Nelson Chimes M.D.   On: 03/15/2019 14:56   Ct Abdomen Pelvis W Contrast  Result Date: 03/17/2019 CLINICAL DATA:  Squamous cell carcinoma the lungs. Right cerebellar stroke. EXAM: CT CHEST, ABDOMEN, AND PELVIS WITH CONTRAST TECHNIQUE: Multidetector CT imaging of the chest, abdomen and pelvis was performed following the standard protocol during bolus administration of intravenous contrast. CONTRAST:  47mL OMNIPAQUE IOHEXOL 300 MG/ML  SOLN COMPARISON:  Chest CT from 06/04/2018 and PET-CT from 08/15/2017 FINDINGS: CT CHEST FINDINGS Cardiovascular: Coronary, aortic arch, and branch vessel atherosclerotic vascular disease. Mild cardiomegaly. Interventricular septal thickening suggesting left ventricular hypertrophy. Mediastinum/Nodes: Right paratracheal node 1.0 cm in short axis on image 19/3, formerly 0.6 cm. Additional small right paratracheal lymph nodes are below 1 cm in diameter but mildly increased from prior exam. Subcarinal node 1.1 cm in short axis on image  30/3, formerly 0.5 cm. Bilateral gynecomastia. Lungs/Pleura: Small right pleural effusion. There is complete occlusion of the right upper lobe bronchus. There is atelectasis and possibly some mild alveolar filling airspace opacity posteriorly in the right upper lobe, with aerated anterior portion of the right upper lobe. Airway thickening along the bronchus intermedius resulting in some luminal narrowing, with airway thickening  centrally in the right middle lobe and right lower lobe. Superior segment right lower lobe airspace opacity on image 67/5 could be from prior radiation therapy though infection is not excluded. Fiducials are noted in the superior segment right lower lobe. Further inferiorly in the vicinity of the prior pulmonary nodule, there is indistinct patchy opacity which could be from radiation pneumonitis or pneumonia, for example as shown on image 93/5. The original nodule is not readily apparent against this background density. There is some associated surrounding ground-glass opacity. There is some faint nodularity peripherally in the left upper lobe. On image 62/5, a triangular 0.4 by 0.3 cm nodule is new or significantly increased from prior. Indistinct ground-glass density nodule in the left upper lobe measuring 0.9 cm in diameter on image 78/5 is new. Subsegmental atelectasis is present anteriorly in left lower lobe. Small fiducials are present anteriorly in the left lower lobe and there are bandlike opacities along the left hemidiaphragm with crowding of bronchovascular structures suggesting some degree of volume loss. The lobulated nodule along the inferior pulmonary ligament is somewhat obscured by surrounding atelectasis, for example on image 114/5, but is less sharply defined and possibly less prominent. There is some additional nodularity in this vicinity, for example posteriorly on image 112/5 where there is a 1.7 by 1.3 cm ill-defined nodular density, probably inflammatory. Musculoskeletal: Thoracic spondylosis. CT ABDOMEN PELVIS FINDINGS Hepatobiliary: Cholecystectomy. The common bile duct measures 1.1 cm in diameter, not changed from 08/15/2017, probably a physiologic response to cholecystectomy. No focal liver lesion is identified. Pancreas: Unremarkable Spleen: Unremarkable Adrenals/Urinary Tract: 1.7 by 1.8 cm left adrenal mass is unchanged in size from 08/15/2017 and was not previously hypermetabolic,  hence likely benign. Small hypodense lesions of the left mid kidney right kidney upper pole probably cysts but technically too small to characterize. Considerable vascular calcifications along the renal hila bilaterally. No definite urinary tract calculi. Stomach/Bowel: Unremarkable Vascular/Lymphatic: Aortoiliac atherosclerotic vascular disease. No pathologic adenopathy identified. Reproductive: Faint calcification in the right posterior prostate gland. Other: No supplemental non-categorized findings. Musculoskeletal: Incidental lipoma in the right vastus lateralis muscle. Degenerative disc disease at L4-5. IMPRESSION: 1. Both the right lower lobe pulmonary nodule on the left lower lobe pulmonary nodule are reduced in conspicuity with some degree of surrounding airspace opacity, possibly radiation pneumonitis if the patient has had interval radiation therapy. 2. Complete occlusion of the right upper lobe bronchus. Most of the right upper lobe is collapsed potentially with a small amount of airspace filling process; there is air trapping in portions of the right upper lobe anteriorly. 3. Airway thickening centrally in the right lung, resulting in some luminal narrowing of the bronchus intermedius. 4. There is a new 4 mm triangular-shaped left upper lobe pulmonary nodule along with a new small ground-glass density nodule in the left upper lobe which merit surveillance. 5. Mildly enlarged paratracheal and subcarinal lymph nodes likewise merits surveillance although could be reactive. 6. Other imaging findings of potential clinical significance: Aortic Atherosclerosis (ICD10-I70.0). Coronary atherosclerosis. Mild cardiomegaly with suggestion of left ventricular hypertrophy. Small right pleural effusion. Chronically stable small left adrenal mass, not previously hypermetabolic on PET-CT and  hence likely benign. Electronically Signed   By: Van Clines M.D.   On: 03/17/2019 15:11   Vas [place] Transcranial Doppler W  Bubbles  Result Date: 03/22/2019  Transcranial Doppler with Bubble Indications: Stroke. Performing Technologist: Abram Sander RVS  Examination Guidelines: A complete evaluation includes B-mode imaging, spectral Doppler, color Doppler, and power Doppler as needed of all accessible portions of each vessel. Bilateral testing is considered an integral part of a complete examination. Limited examinations for reoccurring indications may be performed as noted.  Summary:  A vascular evaluation was performed. The left middle cerebral artery was studied. An IV was inserted into the patient's right forearm. Verbal informed consent was obtained.  Interpretation: HITS heard at rest: None HITS heard during valsalva: None Negative TCD Bubble study *See table(s) above for measurements and observations.  Diagnosing physician: Antony Contras MD Electronically signed by Antony Contras MD on 03/22/2019 at 2:02:31 PM.    Final    Vas [place] Lower Extremity Venous (dvt)  Result Date: 03/25/2019  Lower Venous Study Indications: Assess fr new DVT sine 03/17/2019.  Performing Technologist: Toma Copier RVS  Examination Guidelines: A complete evaluation includes B-mode imaging, spectral Doppler, color Doppler, and power Doppler as needed of all accessible portions of each vessel. Bilateral testing is considered an integral part of a complete examination. Limited examinations for reoccurring indications may be performed as noted.  +---------+---------------+---------+-----------+----------+-------+  RIGHT     Compressibility Phasicity Spontaneity Properties Summary  +---------+---------------+---------+-----------+----------+-------+  CFV       Full            Yes       Yes                             +---------+---------------+---------+-----------+----------+-------+  SFJ       Full                                                      +---------+---------------+---------+-----------+----------+-------+  FV Prox   Full            Yes        Yes                             +---------+---------------+---------+-----------+----------+-------+  FV Mid    Full                                                      +---------+---------------+---------+-----------+----------+-------+  FV Distal Full            Yes       Yes                             +---------+---------------+---------+-----------+----------+-------+  PFV       Full            Yes       Yes                             +---------+---------------+---------+-----------+----------+-------+  POP  Full            Yes       Yes                             +---------+---------------+---------+-----------+----------+-------+  PTV       Full                                                      +---------+---------------+---------+-----------+----------+-------+  PERO      Full                                                      +---------+---------------+---------+-----------+----------+-------+   +---------+---------------+---------+-----------+----------+------------------+  LEFT      Compressibility Phasicity Spontaneity Properties Summary             +---------+---------------+---------+-----------+----------+------------------+  CFV       Full            Yes       Yes                                        +---------+---------------+---------+-----------+----------+------------------+  SFJ       Full                                                                 +---------+---------------+---------+-----------+----------+------------------+  FV Prox   Full            Yes       Yes                                        +---------+---------------+---------+-----------+----------+------------------+  FV Mid    Full                                                                 +---------+---------------+---------+-----------+----------+------------------+  FV Distal Full            Yes       Yes                                         +---------+---------------+---------+-----------+----------+------------------+  PFV       Full            Yes       Yes                                        +---------+---------------+---------+-----------+----------+------------------+  POP       Full            Yes       Yes                                        +---------+---------------+---------+-----------+----------+------------------+  PTV       Partial                                                              +---------+---------------+---------+-----------+----------+------------------+  PERO      Full                                             Difficult to image  +---------+---------------+---------+-----------+----------+------------------+ There is a partial DVT noted in the mid calf . There is no propagation noted however there appears to be some resolution of the DVT noted 03/17/2019    Summary: Right: There is no evidence of deep vein thrombosis in the lower extremity. No cystic structure found in the popliteal fossa. Left: There is no evidence of deep vein thrombosis in the lower extremity. See technical notes listed above. No cystic structure found in the popliteal fossa.  *See table(s) above for measurements and observations. Electronically signed by Monica Martinez MD on 03/25/2019 at 4:27:15 PM.    Final    Vas [place] Lower Extremity Venous (dvt)  Result Date: 03/18/2019  Lower Venous Study Indications: Stroke.  Performing Technologist: Maudry Mayhew MHA, RDMS, RVT, RDCS  Examination Guidelines: A complete evaluation includes B-mode imaging, spectral Doppler, color Doppler, and power Doppler as needed of all accessible portions of each vessel. Bilateral testing is considered an integral part of a complete examination. Limited examinations for reoccurring indications may be performed as noted.  +---------+---------------+---------+-----------+----------+-------+  RIGHT     Compressibility Phasicity Spontaneity Properties Summary   +---------+---------------+---------+-----------+----------+-------+  CFV       Full            Yes       Yes                             +---------+---------------+---------+-----------+----------+-------+  SFJ       Full                                                      +---------+---------------+---------+-----------+----------+-------+  FV Prox   Full                                                      +---------+---------------+---------+-----------+----------+-------+  FV Mid    Full                                                      +---------+---------------+---------+-----------+----------+-------+  FV Distal Full                                                      +---------+---------------+---------+-----------+----------+-------+  PFV       Full                                                      +---------+---------------+---------+-----------+----------+-------+  POP       Full            Yes       Yes                             +---------+---------------+---------+-----------+----------+-------+  PTV       Full                                                      +---------+---------------+---------+-----------+----------+-------+  PERO      Full                                                      +---------+---------------+---------+-----------+----------+-------+   +---------+---------------+---------+-----------+----------+-------+  LEFT      Compressibility Phasicity Spontaneity Properties Summary  +---------+---------------+---------+-----------+----------+-------+  CFV       Full            Yes       Yes                             +---------+---------------+---------+-----------+----------+-------+  SFJ       Full                                                      +---------+---------------+---------+-----------+----------+-------+  FV Prox   Full                                                      +---------+---------------+---------+-----------+----------+-------+  FV Mid    Full                                                       +---------+---------------+---------+-----------+----------+-------+  FV Distal Full                                                      +---------+---------------+---------+-----------+----------+-------+  PFV       Full                                                      +---------+---------------+---------+-----------+----------+-------+  POP       Full            Yes       Yes                             +---------+---------------+---------+-----------+----------+-------+  PTV       None                      No                     Acute    +---------+---------------+---------+-----------+----------+-------+  PERO      Full                                                      +---------+---------------+---------+-----------+----------+-------+     Summary: Right: There is no evidence of deep vein thrombosis in the lower extremity. No cystic structure found in the popliteal fossa. Left: Findings consistent with acute deep vein thrombosis involving the left posterior tibial vein. No cystic structure found in the popliteal fossa.  *See table(s) above for measurements and observations. Electronically signed by Servando Snare MD on 03/18/2019 at 4:34:38 PM.    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  Repeat ultrasound Doppler a week later showed no evidence for deep venous thrombosis involving the lower extremities.  The patient was also found on hypercoagulable panel to have heterozygous factor V Leiden. I had a lengthy discussion with the patient today about his condition.  I am not sure if the first ultrasound Doppler was completely accurate. Recommendation at the time I saw the the patient in the hospital was to continue Eliquis and consider IVC filter if he has  recurrent deep venous thrombosis versus switching his treatment to Lovenox.  The patient was not interested in subcutaneous injection and preferred to continue his current treatment with Eliquis. He heterozygous factor V Leiden with increased risk of thrombosis by 5 times compared to the general population but the patient is currently on anticoagulation. I reviewed with the patient to continue his current treatment with Eliquis for now. I will see the patient on as-needed basis at this point and he will continue his routine follow-up visit and evaluation by Dr. Joylene Draft. He was advised to call if he has any other concerning symptoms. The patient voices understanding of current disease status and treatment options and is in agreement with the current care plan.  All questions were answered. The patient knows to call the clinic with any problems, questions or concerns. We can certainly see the patient much sooner if necessary.  I spent 10 minutes counseling the patient face to face. The total time spent in the appointment was 15  minutes.  Disclaimer: This note was dictated with voice recognition software. Similar sounding words can inadvertently be transcribed and may not be corrected upon review.

## 2019-04-14 NOTE — Telephone Encounter (Signed)
Pt's wife Otila Kluver is calling back [phone]

## 2019-04-14 NOTE — Telephone Encounter (Signed)
Primary Pulmonologist: Travis [name] Last office visit and with whom: 04/07/19 Travis [name] What do we see them for (pulmonary problems): COPD Last OV assessment/plan: CXR today  Continue mucinex twice day   Continue Eliquis and ASA  Order speech/ swallow eval   Follow up with Dr. [name] next available (4-8 weeks) OR sooner if you develop blood in mucus   Was appointment offered to patient (explain)?  Patient and wife wanted recommendations first.    Reason for call: Spoke with patient and his wife Travis [name]. Per patient, he has not been feeling well since Saturday. He has a productive cough with yellowish, brown phlegm. He coughs during the day and night, even after using the Tussionex. He took his temp this morning and it was around 99.34F, which he stated is a fever for him. Denied any body aches today but did have some on Sunday morning.  He was seen by Dr. Julien Nordmann this morning and was advised to follow up with pulmonary. Dr. Julien Nordmann told him he heard some rattling in his right lower lobe.   Pharmacy is [facility] on [address].   Beth, please advise. Thanks!

## 2019-04-14 NOTE — Telephone Encounter (Signed)
Can we try and get disc from CT report in April? Can you also call patient and ask how he's doing and if he has had anymore hemoptysis? He has an apt with Dr. [name] on 6/15 and we have scheduled a follow-up CT Chest scheduled form 7/2.

## 2019-04-15 DIAGNOSIS — I129 Hypertensive chronic kidney disease with stage 1 through stage 4 chronic kidney disease, or unspecified chronic kidney disease: Secondary | ICD-10-CM | POA: Diagnosis not present

## 2019-04-15 DIAGNOSIS — I69351 Hemiplegia and hemiparesis following cerebral infarction affecting right dominant side: Secondary | ICD-10-CM | POA: Diagnosis not present

## 2019-04-15 DIAGNOSIS — N183 Chronic kidney disease, stage 3 (moderate): Secondary | ICD-10-CM | POA: Diagnosis not present

## 2019-04-15 DIAGNOSIS — E1151 Type 2 diabetes mellitus with diabetic peripheral angiopathy without gangrene: Secondary | ICD-10-CM | POA: Diagnosis not present

## 2019-04-15 DIAGNOSIS — E1122 Type 2 diabetes mellitus with diabetic chronic kidney disease: Secondary | ICD-10-CM | POA: Diagnosis not present

## 2019-04-15 DIAGNOSIS — I2510 Atherosclerotic heart disease of native coronary artery without angina pectoris: Secondary | ICD-10-CM | POA: Diagnosis not present

## 2019-04-15 NOTE — Telephone Encounter (Addendum)
I have placed a sputum sample cup in a brown bag w/ pt's name, DOB, and label "Sputum Cup" in the vestibule samples basket and I have made the front staff aware. Pt's wife reported to me over the phone yesterday that she & pt will be coming to pick up sputum cup around mid-morning today 04/15/2019.   Empty sputum cup & biohazard bag has been placed up front within a brown paper bag labeled w/ pt's identifiers. Nothing further needed at this time.

## 2019-04-16 ENCOUNTER — Other Ambulatory Visit: Payer: Medicare Other

## 2019-04-16 DIAGNOSIS — R042 Hemoptysis: Secondary | ICD-10-CM

## 2019-04-19 DIAGNOSIS — N183 Chronic kidney disease, stage 3 (moderate): Secondary | ICD-10-CM | POA: Diagnosis not present

## 2019-04-19 DIAGNOSIS — E1122 Type 2 diabetes mellitus with diabetic chronic kidney disease: Secondary | ICD-10-CM | POA: Diagnosis not present

## 2019-04-19 DIAGNOSIS — I2510 Atherosclerotic heart disease of native coronary artery without angina pectoris: Secondary | ICD-10-CM | POA: Diagnosis not present

## 2019-04-19 DIAGNOSIS — E1151 Type 2 diabetes mellitus with diabetic peripheral angiopathy without gangrene: Secondary | ICD-10-CM | POA: Diagnosis not present

## 2019-04-19 DIAGNOSIS — I69351 Hemiplegia and hemiparesis following cerebral infarction affecting right dominant side: Secondary | ICD-10-CM | POA: Diagnosis not present

## 2019-04-19 DIAGNOSIS — I129 Hypertensive chronic kidney disease with stage 1 through stage 4 chronic kidney disease, or unspecified chronic kidney disease: Secondary | ICD-10-CM | POA: Diagnosis not present

## 2019-04-19 LAB — RESPIRATORY CULTURE OR RESPIRATORY AND SPUTUM CULTURE
MICRO NUMBER:: 541741
RESULT:: NORMAL
SPECIMEN QUALITY:: ADEQUATE

## 2019-04-20 DIAGNOSIS — N183 Chronic kidney disease, stage 3 (moderate): Secondary | ICD-10-CM | POA: Diagnosis not present

## 2019-04-20 DIAGNOSIS — E1122 Type 2 diabetes mellitus with diabetic chronic kidney disease: Secondary | ICD-10-CM | POA: Diagnosis not present

## 2019-04-20 DIAGNOSIS — I2510 Atherosclerotic heart disease of native coronary artery without angina pectoris: Secondary | ICD-10-CM | POA: Diagnosis not present

## 2019-04-20 DIAGNOSIS — I129 Hypertensive chronic kidney disease with stage 1 through stage 4 chronic kidney disease, or unspecified chronic kidney disease: Secondary | ICD-10-CM | POA: Diagnosis not present

## 2019-04-20 DIAGNOSIS — I69351 Hemiplegia and hemiparesis following cerebral infarction affecting right dominant side: Secondary | ICD-10-CM | POA: Diagnosis not present

## 2019-04-20 DIAGNOSIS — E1151 Type 2 diabetes mellitus with diabetic peripheral angiopathy without gangrene: Secondary | ICD-10-CM | POA: Diagnosis not present

## 2019-04-21 ENCOUNTER — Other Ambulatory Visit: Payer: Self-pay | Admitting: Acute Care

## 2019-04-21 ENCOUNTER — Encounter (HOSPITAL_COMMUNITY): Payer: Self-pay

## 2019-04-21 ENCOUNTER — Emergency Department (HOSPITAL_COMMUNITY): Payer: Medicare Other

## 2019-04-21 ENCOUNTER — Telehealth: Payer: Self-pay

## 2019-04-21 ENCOUNTER — Observation Stay (HOSPITAL_COMMUNITY)
Admission: EM | Admit: 2019-04-21 | Discharge: 2019-04-22 | Disposition: A | Discharge status: Home / Self Care | Payer: Medicare Other | Attending: Internal Medicine | Admitting: Internal Medicine

## 2019-04-21 ENCOUNTER — Other Ambulatory Visit: Payer: Self-pay

## 2019-04-21 DIAGNOSIS — Z794 Long term (current) use of insulin: Secondary | ICD-10-CM | POA: Insufficient documentation

## 2019-04-21 DIAGNOSIS — N183 Chronic kidney disease, stage 3 unspecified: Secondary | ICD-10-CM | POA: Diagnosis present

## 2019-04-21 DIAGNOSIS — J90 Pleural effusion, not elsewhere classified: Secondary | ICD-10-CM | POA: Insufficient documentation

## 2019-04-21 DIAGNOSIS — R042 Hemoptysis: Principal | ICD-10-CM | POA: Insufficient documentation

## 2019-04-21 DIAGNOSIS — D6851 Activated protein C resistance: Secondary | ICD-10-CM | POA: Diagnosis not present

## 2019-04-21 DIAGNOSIS — E1122 Type 2 diabetes mellitus with diabetic chronic kidney disease: Secondary | ICD-10-CM | POA: Diagnosis not present

## 2019-04-21 DIAGNOSIS — Z79899 Other long term (current) drug therapy: Secondary | ICD-10-CM | POA: Diagnosis not present

## 2019-04-21 DIAGNOSIS — Z66 Do not resuscitate: Secondary | ICD-10-CM | POA: Insufficient documentation

## 2019-04-21 DIAGNOSIS — Z85828 Personal history of other malignant neoplasm of skin: Secondary | ICD-10-CM | POA: Insufficient documentation

## 2019-04-21 DIAGNOSIS — Z7901 Long term (current) use of anticoagulants: Secondary | ICD-10-CM | POA: Diagnosis not present

## 2019-04-21 DIAGNOSIS — E1165 Type 2 diabetes mellitus with hyperglycemia: Secondary | ICD-10-CM | POA: Diagnosis present

## 2019-04-21 DIAGNOSIS — J189 Pneumonia, unspecified organism: Secondary | ICD-10-CM | POA: Insufficient documentation

## 2019-04-21 DIAGNOSIS — IMO0002 Reserved for concepts with insufficient information to code with codable children: Secondary | ICD-10-CM | POA: Diagnosis present

## 2019-04-21 DIAGNOSIS — C3490 Malignant neoplasm of unspecified part of unspecified bronchus or lung: Secondary | ICD-10-CM | POA: Diagnosis not present

## 2019-04-21 DIAGNOSIS — I2510 Atherosclerotic heart disease of native coronary artery without angina pectoris: Secondary | ICD-10-CM | POA: Diagnosis not present

## 2019-04-21 DIAGNOSIS — Z8249 Family history of ischemic heart disease and other diseases of the circulatory system: Secondary | ICD-10-CM | POA: Insufficient documentation

## 2019-04-21 DIAGNOSIS — E785 Hyperlipidemia, unspecified: Secondary | ICD-10-CM | POA: Insufficient documentation

## 2019-04-21 DIAGNOSIS — Z86711 Personal history of pulmonary embolism: Secondary | ICD-10-CM | POA: Diagnosis not present

## 2019-04-21 DIAGNOSIS — E1151 Type 2 diabetes mellitus with diabetic peripheral angiopathy without gangrene: Secondary | ICD-10-CM | POA: Diagnosis not present

## 2019-04-21 DIAGNOSIS — Z87891 Personal history of nicotine dependence: Secondary | ICD-10-CM | POA: Diagnosis not present

## 2019-04-21 DIAGNOSIS — I639 Cerebral infarction, unspecified: Secondary | ICD-10-CM | POA: Diagnosis present

## 2019-04-21 DIAGNOSIS — Z1159 Encounter for screening for other viral diseases: Secondary | ICD-10-CM | POA: Insufficient documentation

## 2019-04-21 DIAGNOSIS — Z7982 Long term (current) use of aspirin: Secondary | ICD-10-CM | POA: Insufficient documentation

## 2019-04-21 DIAGNOSIS — Z86718 Personal history of other venous thrombosis and embolism: Secondary | ICD-10-CM | POA: Diagnosis not present

## 2019-04-21 DIAGNOSIS — Z8673 Personal history of transient ischemic attack (TIA), and cerebral infarction without residual deficits: Secondary | ICD-10-CM | POA: Diagnosis not present

## 2019-04-21 DIAGNOSIS — I252 Old myocardial infarction: Secondary | ICD-10-CM | POA: Insufficient documentation

## 2019-04-21 DIAGNOSIS — R918 Other nonspecific abnormal finding of lung field: Secondary | ICD-10-CM | POA: Diagnosis not present

## 2019-04-21 DIAGNOSIS — I129 Hypertensive chronic kidney disease with stage 1 through stage 4 chronic kidney disease, or unspecified chronic kidney disease: Secondary | ICD-10-CM | POA: Diagnosis not present

## 2019-04-21 DIAGNOSIS — J440 Chronic obstructive pulmonary disease with acute lower respiratory infection: Secondary | ICD-10-CM | POA: Diagnosis not present

## 2019-04-21 DIAGNOSIS — I69351 Hemiplegia and hemiparesis following cerebral infarction affecting right dominant side: Secondary | ICD-10-CM | POA: Diagnosis not present

## 2019-04-21 DIAGNOSIS — I824Z9 Acute embolism and thrombosis of unspecified deep veins of unspecified distal lower extremity: Secondary | ICD-10-CM | POA: Diagnosis present

## 2019-04-21 DIAGNOSIS — C3491 Malignant neoplasm of unspecified part of right bronchus or lung: Secondary | ICD-10-CM | POA: Diagnosis not present

## 2019-04-21 DIAGNOSIS — I10 Essential (primary) hypertension: Secondary | ICD-10-CM | POA: Diagnosis present

## 2019-04-21 DIAGNOSIS — I82442 Acute embolism and thrombosis of left tibial vein: Secondary | ICD-10-CM | POA: Insufficient documentation

## 2019-04-21 DIAGNOSIS — I700 Atherosclerosis of aorta: Secondary | ICD-10-CM | POA: Insufficient documentation

## 2019-04-21 DIAGNOSIS — C3401 Malignant neoplasm of right main bronchus: Secondary | ICD-10-CM | POA: Insufficient documentation

## 2019-04-21 DIAGNOSIS — M1990 Unspecified osteoarthritis, unspecified site: Secondary | ICD-10-CM | POA: Insufficient documentation

## 2019-04-21 DIAGNOSIS — N179 Acute kidney failure, unspecified: Secondary | ICD-10-CM | POA: Insufficient documentation

## 2019-04-21 DIAGNOSIS — Z20828 Contact with and (suspected) exposure to other viral communicable diseases: Secondary | ICD-10-CM | POA: Diagnosis not present

## 2019-04-21 LAB — CBC
HCT: 39.7 % (ref 39.0–52.0)
Hemoglobin: 12.5 g/dL — ABNORMAL LOW (ref 13.0–17.0)
MCH: 26.3 pg (ref 26.0–34.0)
MCHC: 31.5 g/dL (ref 30.0–36.0)
MCV: 83.4 fL (ref 80.0–100.0)
Platelets: 278 10*3/uL (ref 150–400)
RBC: 4.76 MIL/uL (ref 4.22–5.81)
RDW: 14.6 % (ref 11.5–15.5)
WBC: 14.8 10*3/uL — ABNORMAL HIGH (ref 4.0–10.5)
nRBC: 0 % (ref 0.0–0.2)

## 2019-04-21 LAB — TYPE AND SCREEN
ABO/RH(D): O NEG
Antibody Screen: NEGATIVE

## 2019-04-21 LAB — ABO/RH: ABO/RH(D): O NEG

## 2019-04-21 MED ORDER — HYDROCODONE-HOMATROPINE 5-1.5 MG/5ML PO SYRP: 5.0000 mL | ORAL_SOLUTION | Freq: Every evening | ORAL | 0 refills | Status: AC | PRN

## 2019-04-21 MED ORDER — SODIUM CHLORIDE 0.9 % IV BOLUS (SEPSIS)
500.0000 mL | Freq: Once | INTRAVENOUS | Status: AC
  Administered 2019-04-22: 500 mL via INTRAVENOUS

## 2019-04-21 NOTE — ED Triage Notes (Signed)
Pt reports 1 episode of hemoptysis today, pt takes a baby aspirin daily, denies any complaints. Pt a.o, nad noted

## 2019-04-21 NOTE — Telephone Encounter (Signed)
CT image is available in Epic from 03/17/19.  No records reviewed from [place] as of yet but notes from [facility] have arrived and been placed on Dr. Cordelia Pen desk and copy to scan.   Will route to Dr. [name] to see if the CT image from 03/17/19 is all that is needed or if further disc or reports are desired for review.  Patient is scheduled for CT 05/13/19 and has scheduled appt with [name] on 04/26/19.    Dr. [name] are you needing further info on this patient? You had [facility] and [facility] notes.  Will the image from 03/17/19 cover this and the notes from [facility] are in the media tab for review.    Also your patient sees you on 04/26/19 but has repeat CT scheduled for 05/13/19.  Does the appt for you need to be moved?  Please advise.  Thank you

## 2019-04-21 NOTE — Progress Notes (Signed)
I received a call from the answering service to call  [name] after hours. He states he had coughed and he had some bright red blood in the sputum. He stated it was about 1/4 cup. He states it was about 30 minutes before he called. Started Eliquis 10/2018 5 mg daily for Pulmonary embolism 2/2 radiation for lung cancer. Recent DVT 03/17/2019, then gone 5/14 with repeat doppler .  Eliquis dose was doubled 03/2019 to 10 mg daily. He had back pain until yesterday He has had a persistent cough.  He has since coughed and there was no blood  in the sputum. I told him we need to suppress his cough I have asked him to stop the Mucinex for now. I have phoned in additional Hydromet cough medication  Per his wife he has at least 2-3 doses at home to use now.     Pt.  was found during recent hospitalization 03/2019 to have recurrent deep venous thrombosis involving the left posterior tibial vein on ultrasound Doppler of the lower extremity on 03/17/2019 while on treatment with Eliquis 5 mg daily.  Repeat bilateral lower extremity Doppler on 03/25/2019 showed no evidence of deep venous thrombosis in the lower extremities.  The patient had hypercoagulable panel performed during his hospitalization and it showed heterozygous factor V Leiden.    I am concerned that he had + DVT 03/17/2019 and within a week the DVT was no longer present. I have suspicion that the clot may have migrated to his lung.  He has had a persistent cough. Now he has developed hemoptysis with BRB about 1/4 cup per wife. He does not complain of dyspnea but states he has had right sided back pain until today, and now it is gone. Per Wells Criteria he has score of 3.5 or moderate risk. He has additional factor  V Leiden.   I have advised the patient to go to the ED. Both he and his wife verbalized understanding.  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 There is atelectasis and possibly some mild  alveolar filling airspace opacity posteriorly in the right upper lobe, with aerated anterior portion of the right upper lobe. Airway thickening along the bronchus intermedius resulting in some luminal narrowing, with airway thickening centrally in the right middle lobe and right lower lobe. Superior segment right lower lobe airspace opacity on image 67/5 could be from prior radiation therapy though infection is not excluded.  Fiducials are noted in the superior segment right lower lobe. Further inferiorly in the vicinity of the prior pulmonary nodule, there is indistinct patchy opacity which could be from radiation pneumonitis or pneumonia, for example as shown on image 93/5. The original nodule is not readily apparent against this background density. There is some associated surrounding ground-glass opacity. There is some faint nodularity peripherally in the left upper lobe. On image 62/5, a triangular 0.4 by 0.3 cm nodule is new or significantly increased from prior. Indistinct ground-glass density nodule in the left upper lobe measuring 0.9 cm in diameter on image 78/5 is new. Subsegmental atelectasis is present anteriorly in left lower lobe. Small fiducials are present anteriorly in the left lower lobe and there are bandlike opacities along the left hemidiaphragm with crowding of bronchovascular structures suggesting some degree of volume loss. The lobulated nodule along the inferior pulmonary ligament is somewhat obscured by surrounding atelectasis, for example on image 114/5, but is less sharply defined and possibly less prominent. There is  some additional nodularity in this vicinity, for example posteriorly on image 112/5 where there is a 1.7 by 1.3 cm ill-defined nodular density, probably inflammatory.   Pulmonary Embolism Wells Score Select Criteria:  Symptoms of DVT (3 points)  No alternative diagnosis better explains the illness (3 points)  Tachycardia with pulse >  100 (1.5 points)  Immobilization (>= 3 days) or surgery in the previous four weeks (1.5 points)  Prior history of DVT or pulmonary embolism (1.5 points)  Presence of hemoptysis (1 point)  Presence of malignancy (1 point) Results: Total Criteria Point Count:   0  Pulmonary Embolism Risk Score Interpretation Score > 6: High probability Score >= 2 and <= 6: Moderate probability Score < 2: Low Probability   Travis [name], AGACNP-BC [place] Pager # [phone] 04/21/2019 6:27 PM

## 2019-04-21 NOTE — ED Provider Notes (Addendum)
TIME SEEN: 11:52 PM  CHIEF COMPLAINT: Hemoptysis  HPI: Patient is a 68 year old male with history of hypertension, diabetes, hyperlipidemia, COPD, chronic kidney disease, CAD, factor V Leiden with history of DVT and PE on Eliquis who presents today with an episode of hemoptysis.  States he had hemoptysis about a week ago that occurred only once and then resolved.  States today he coughed again and had about 1/4 cup of bright red blood.  He denies any chest pain or shortness of breath.  He has not been coughing since.  States he has been on guaifenesin recently and was instructed by his pulmonologist tonight to stop this.  Was sent here to rule out pulmonary embolus.  He denies any fevers or chills.  He did have back pain that resolved yesterday.  ROS: See HPI Constitutional: no fever  Eyes: no drainage  ENT: no runny nose   Cardiovascular:  no chest pain  Resp: no SOB  GI: no vomiting GU: no dysuria Integumentary: no rash  Allergy: no hives  Musculoskeletal: no leg swelling  Neurological: no slurred speech ROS otherwise negative  PAST MEDICAL HISTORY/PAST SURGICAL HISTORY:  Past Medical History:  Diagnosis Date  . CAD (coronary artery disease)   . Cancer ([place])    skin cancer face  . COPD (chronic obstructive pulmonary disease) ([place])   . Coronary artery disease    PCA 1994l; PCI august 2003 of an obtust marginal. Catheterization at the time reveal: left main normal; LAD 50-75% between first and second diagonals, 50% after lesion; left circumflex 95% in obtuse marg. RCA 100% proximal.  . Diabetes mellitus    type 2  . Hyperlipemia   . Hypertension   . Kidney disease    chronic; stage 2 followed By Dr Deterding  . Myocardial infarction ([place])   . Osteoarthritis   . Peripheral vascular disease ([place])   . Tobacco abuse     MEDICATIONS:  Prior to Admission medications   Medication Sig Start Date End Date Taking? Authorizing Provider  acetaminophen (TYLENOL) 325 MG tablet Take 2  tablets (650 mg total) by mouth every 4 (four) hours as needed for mild pain (or temp > 37.5 C (99.5 F)). 03/19/19   Lavina Hamman, MD  albuterol [facility] HFA) [phone] Base) MCG/ACT inhaler Inhale 2 puffs into the lungs every 6 (six) hours as needed for wheezing or shortness of breath. 03/26/19   Angiulli, Lavon Paganini, PA-C  allopurinol (ZYLOPRIM) 100 MG tablet Take 1 tablet (100 mg total) by mouth at bedtime. 03/26/19   Angiulli, Lavon Paganini, PA-C  amLODipine (NORVASC) 10 MG tablet Take 1 tablet (10 mg total) by mouth daily. 03/26/19   Angiulli, Lavon Paganini, PA-C  apixaban (ELIQUIS) 5 MG TABS tablet Take 1 tablet (5 mg total) by mouth 2 (two) times daily. 03/26/19   Angiulli, Lavon Paganini, PA-C  aspirin EC 81 MG tablet Take 1 tablet (81 mg total) by mouth daily. 12/10/18   Burnell Blanks, MD  benazepril (LOTENSIN) 40 MG tablet Take 1 tablet (40 mg total) by mouth at bedtime. 03/26/19   Angiulli, Lavon Paganini, PA-C  benzonatate (TESSALON) 100 MG capsule Take 1 capsule (100 mg total) by mouth 3 (three) times daily as needed for cough. 03/26/19   Angiulli, Lavon Paganini, PA-C  calcitRIOL (ROCALTROL) 0.25 MCG capsule Take 1 capsule (0.25 mcg total) by mouth daily. 03/26/19   Angiulli, Lavon Paganini, PA-C  chlorpheniramine-HYDROcodone (TUSSIONEX PENNKINETIC ER) 10-8 MG/5ML SUER Take 5 mLs by mouth 2 (two)  times daily. 04/07/19   Martyn Ehrich, NP  Cinnamon 500 MG capsule Take 500 mg by mouth daily.    [provider]  dextromethorphan (DELSYM) 30 MG/5ML liquid Take 2.5 mLs (15 mg total) by mouth 2 (two) times daily. 03/26/19   Angiulli, Lavon Paganini, PA-C  guaiFENesin (MUCINEX) 600 MG 12 hr tablet Take 1 tablet (600 mg total) by mouth 2 (two) times daily. 03/19/19   Lavina Hamman, MD  HYDROcodone-homatropine (HYDROMET) 5-1.5 MG/5ML syrup Take 5 mLs by mouth at bedtime as needed for cough. 04/21/19   Magdalen Spatz, NP  Insulin Glargine, 2 Unit Dial, (TOUJEO MAX SOLOSTAR) 300 UNIT/ML SOPN Inject 28 Units/day into the skin  every morning.    [provider]  Insulin Glargine, 2 Unit Dial, (TOUJEO MAX SOLOSTAR) 300 UNIT/ML SOPN Inject 11 Units/day into the skin every evening.    [provider]  levofloxacin (LEVAQUIN) 500 MG tablet Take 1 tablet (500 mg total) by mouth daily. 04/14/19   Martyn Ehrich, NP  metoprolol tartrate (LOPRESSOR) 100 MG tablet Take 1 tablet (100 mg total) by mouth 2 (two) times daily. 03/26/19   Angiulli, Lavon Paganini, PA-C  mirtazapine (REMERON) 15 MG tablet Take 1 tablet (15 mg total) by mouth at bedtime. 03/26/19   Angiulli, Lavon Paganini, PA-C    ALLERGIES:  Allergies  Allergen Reactions  . Penicillins Other (See Comments)    UNSPECIFIED CHILDHOOD REACTION Has patient had PCN reaction causing immediate rash, facial/tongue/throat swelling, SOB or lightheadedness with hypotension: Unknown Has patient had a PCN reaction causing severe rash involving mucus membranes or skin necrosis: Unknown Has patient had a PCN reaction that required hospitalization: No Has patient had a PCN reaction occurring within the last 10 years: No If all of the above answers are "NO", then may proceed with Cephalosporin use.     SOCIAL HISTORY:  Social History   Tobacco Use  . Smoking status: Former Smoker    Packs/day: 0.50    Years: 50.00    Pack years: 25.00    Types: Cigarettes    Last attempt to quit: 05/09/2015    Years since quitting: 3.9  . Smokeless tobacco: Former Systems developer    Types: [place] date: 2016  Substance Use Topics  . Alcohol use: No    Alcohol/week: 0.0 standard drinks    FAMILY HISTORY: Family History  Problem Relation Age of Onset  . Coronary artery disease Mother   . Heart disease Mother   . Cancer Father   . Stroke Other   . Heart failure Other   . Pulmonary embolism Other   . Diabetes Brother   . Hypertension Brother   . Heart attack Brother   . Peripheral vascular disease Brother   . Heart disease Brother   . Hypertension Sister     EXAM: BP (!)  150/69 (BP Location: Right Arm)   Pulse 68   Temp 98.4 F (36.9 C) (Oral)   Resp 16   SpO2 97%  CONSTITUTIONAL: Alert and oriented and responds appropriately to questions.  Really, in no distress HEAD: Normocephalic EYES: Conjunctivae clear, pupils appear equal, EOMI ENT: normal nose; moist mucous membranes NECK: Supple, no meningismus, no nuchal rigidity, no LAD  CARD: RRR; S1 and S2 appreciated; no murmurs, no clicks, no rubs, no gallops RESP: Normal chest excursion without splinting or tachypnea; breath sounds clear and equal bilaterally; no wheezes, no rhonchi, no rales, no hypoxia or respiratory distress, speaking full sentences  ABD/GI: Normal bowel sounds; non-distended; soft, non-tender, no rebound, no guarding, no peritoneal signs, no hepatosplenomegaly BACK:  The back appears normal and is non-tender to palpation, there is no CVA tenderness EXT: Normal ROM in all joints; non-tender to palpation; no edema; normal capillary refill; no cyanosis, no calf tenderness or swelling    SKIN: Normal color for age and race; warm; no rash NEURO: Moves all extremities equally PSYCH: The patient's mood and manner are appropriate. Grooming and personal hygiene are appropriate.  MEDICAL DECISION MAKING: Patient here with one episode of hemoptysis.  Sent here to rule out pulmonary embolus.  Will obtain CT imaging.  He is hemodynamically stable here without hypoxia, respiratory distress or continued hemoptysis.  EKG shows no ischemic abnormality.  He is on anticoagulation.  Will monitor closely.  He denies recent infectious symptoms.  ED PROGRESS: Patient's troponin mildly elevated at 0.03.  CTA shows progressive right hilar tumor mass consistent with recurrent lung cancer.  He also has right upper lobe postobstructive pneumonitis and progressive bronchial disease involving the right middle and lower lobe.  There are opacities in both lower lobes that are similar to previous study which may be from post  radiation pneumonitis but infection is on the differential.  He does have a leukocytosis here and given this complaint of new cough, will treat for possible H CAP.  He was recently in the hospital for an acute stroke.  He is now coughing up teaspoonfuls of blood.  Given he is actively bleeding, I feel he needs admission.  COVID swab is pending.  Repeat troponin pending.  He is not thrombocytopenic or coagulopathic other than being on Eliquis.  Will discuss with medicine for admission.  He is not in respiratory distress.  He has no hypoxia or increased work of breathing.  His lungs are clear.  5:08 AM Discussed patient's case with hospitalist, Dr. Alcario Drought.  I have recommended admission and patient (and family if present) agree with this plan. Admitting physician will place admission orders.   I reviewed all nursing notes, vitals, pertinent previous records, EKGs, lab and urine results, imaging (as available).  5:55 AM  Pt's second troponin is negative.  His COVID screen is negative.     EKG Interpretation  Date/Time:  Thursday April 22 2019 00:22:42 EDT Ventricular Rate:  62 PR Interval:    QRS Duration: 126 QT Interval:  475 QTC Calculation: 483 R Axis:   57 Text Interpretation:  Sinus rhythm Atrial premature complex Nonspecific intraventricular conduction delay No significant change since last tracing Confirmed by Ward, Cyril Mourning [id number]) on 04/22/2019 12:30:19 AM         CRITICAL CARE Performed by: Cyril Mourning Ward   Total critical care time: 65 minutes  Critical care time was exclusive of separately billable procedures and treating other patients.  Critical care was necessary to treat or prevent imminent or life-threatening deterioration.  Critical care was time spent personally by me on the following activities: development of treatment plan with patient and/or surrogate as well as nursing, discussions with consultants, evaluation of patient's response to treatment, examination of  patient, obtaining history from patient or surrogate, ordering and performing treatments and interventions, ordering and review of laboratory studies, ordering and review of radiographic studies, pulse oximetry and re-evaluation of patient's condition.    Ward, Delice Bison, DO 04/22/19 [place], Delice Bison, DO 04/22/19 [id number]

## 2019-04-22 ENCOUNTER — Emergency Department (HOSPITAL_COMMUNITY): Payer: Medicare Other

## 2019-04-22 ENCOUNTER — Encounter (HOSPITAL_COMMUNITY): Payer: Self-pay | Admitting: Radiology

## 2019-04-22 DIAGNOSIS — C3491 Malignant neoplasm of unspecified part of right bronchus or lung: Secondary | ICD-10-CM | POA: Diagnosis not present

## 2019-04-22 DIAGNOSIS — E1165 Type 2 diabetes mellitus with hyperglycemia: Secondary | ICD-10-CM | POA: Diagnosis present

## 2019-04-22 DIAGNOSIS — R042 Hemoptysis: Principal | ICD-10-CM

## 2019-04-22 DIAGNOSIS — R05 Cough: Secondary | ICD-10-CM

## 2019-04-22 DIAGNOSIS — IMO0002 Reserved for concepts with insufficient information to code with codable children: Secondary | ICD-10-CM | POA: Diagnosis present

## 2019-04-22 DIAGNOSIS — C3490 Malignant neoplasm of unspecified part of unspecified bronchus or lung: Secondary | ICD-10-CM | POA: Diagnosis not present

## 2019-04-22 LAB — SARS CORONAVIRUS 2 BY RT PCR (HOSPITAL ORDER, PERFORMED IN ~~LOC~~ HOSPITAL LAB): SARS Coronavirus 2: NEGATIVE

## 2019-04-22 LAB — BASIC METABOLIC PANEL
Anion gap: 12 (ref 5–15)
BUN: 40 mg/dL — ABNORMAL HIGH (ref 8–23)
CO2: 24 mmol/L (ref 22–32)
Calcium: 9.1 mg/dL (ref 8.9–10.3)
Chloride: 98 mmol/L (ref 98–111)
Creatinine, Ser: 1.83 mg/dL — ABNORMAL HIGH (ref 0.61–1.24)
GFR calc Af Amer: 42 mL/min — ABNORMAL LOW (ref >60)
GFR calc non Af Amer: 37 mL/min — ABNORMAL LOW (ref >60)
Glucose, Bld: 318 mg/dL — ABNORMAL HIGH (ref 70–99)
Potassium: 4.8 mmol/L (ref 3.5–5.1)
Sodium: 134 mmol/L — ABNORMAL LOW (ref 135–145)

## 2019-04-22 LAB — TROPONIN I
Troponin I: 0.03 ng/mL (ref <0.03)
Troponin I: <0.03 ng/mL (ref <0.03)

## 2019-04-22 LAB — PROTIME-INR
INR: 1.2 (ref 0.8–1.2)
Prothrombin Time: 15.3 seconds — ABNORMAL HIGH (ref 11.4–15.2)

## 2019-04-22 MED ORDER — AMLODIPINE BESYLATE 5 MG PO TABS: 10.0000 mg | ORAL_TABLET | Freq: Every day | ORAL | Status: DC

## 2019-04-22 MED ORDER — ASPIRIN EC 81 MG PO TBEC: 81.0000 mg | DELAYED_RELEASE_TABLET | Freq: Every day | ORAL | Status: DC

## 2019-04-22 MED ORDER — ALLOPURINOL 100 MG PO TABS: 100.0000 mg | ORAL_TABLET | Freq: Every day | ORAL | Status: DC

## 2019-04-22 MED ORDER — BENZONATATE 100 MG PO CAPS: 100.0000 mg | ORAL_CAPSULE | Freq: Three times a day (TID) | ORAL | Status: DC | PRN

## 2019-04-22 MED ORDER — CALCITRIOL 0.25 MCG PO CAPS: 0.2500 ug | ORAL_CAPSULE | Freq: Every day | ORAL | Status: DC

## 2019-04-22 MED ORDER — ACETAMINOPHEN 325 MG PO TABS: 650.0000 mg | ORAL_TABLET | Freq: Four times a day (QID) | ORAL | Status: DC | PRN

## 2019-04-22 MED ORDER — SODIUM CHLORIDE 0.9 % IV SOLN
INTRAVENOUS | Status: DC
  Administered 2019-04-22: 07:00:00 via INTRAVENOUS

## 2019-04-22 MED ORDER — HYDROCOD POLST-CPM POLST ER 10-8 MG/5ML PO SUER: 5.0000 mL | Freq: Two times a day (BID) | ORAL | Status: DC

## 2019-04-22 MED ORDER — GUAIFENESIN ER 600 MG PO TB12: 600.0000 mg | ORAL_TABLET | Freq: Two times a day (BID) | ORAL | Status: DC

## 2019-04-22 MED ORDER — METOPROLOL TARTRATE 25 MG PO TABS: 50.0000 mg | ORAL_TABLET | Freq: Two times a day (BID) | ORAL | Status: DC

## 2019-04-22 MED ORDER — SODIUM CHLORIDE 0.9 % IV SOLN
INTRAVENOUS | Status: DC
  Administered 2019-04-22: 09:00:00 via INTRAVENOUS

## 2019-04-22 MED ORDER — DOCUSATE SODIUM 100 MG PO CAPS: 100.0000 mg | ORAL_CAPSULE | Freq: Two times a day (BID) | ORAL | Status: DC

## 2019-04-22 MED ORDER — ACETAMINOPHEN 650 MG RE SUPP: 650.0000 mg | Freq: Four times a day (QID) | RECTAL | Status: DC | PRN

## 2019-04-22 MED ORDER — INSULIN ASPART 100 UNIT/ML ~~LOC~~ SOLN: 0.0000 [IU] | Freq: Three times a day (TID) | SUBCUTANEOUS | Status: DC

## 2019-04-22 MED ORDER — DEXTROMETHORPHAN POLISTIREX ER 30 MG/5ML PO SUER: 15.0000 mg | Freq: Two times a day (BID) | ORAL | Status: DC

## 2019-04-22 MED ORDER — ONDANSETRON HCL 4 MG PO TABS: 4.0000 mg | ORAL_TABLET | Freq: Four times a day (QID) | ORAL | Status: DC | PRN

## 2019-04-22 MED ORDER — INSULIN GLARGINE (2 UNIT DIAL) 300 UNIT/ML ~~LOC~~ SOPN: 11.0000 [IU]/d | PEN_INJECTOR | Freq: Every evening | SUBCUTANEOUS | Status: DC

## 2019-04-22 MED ORDER — INSULIN ASPART 100 UNIT/ML ~~LOC~~ SOLN: 0.0000 [IU] | Freq: Every day | SUBCUTANEOUS | Status: DC

## 2019-04-22 MED ORDER — HYDROCODONE-HOMATROPINE 5-1.5 MG/5ML PO SYRP: 5.0000 mL | ORAL_SOLUTION | Freq: Every evening | ORAL | Status: DC | PRN

## 2019-04-22 MED ORDER — ONDANSETRON HCL 4 MG/2ML IJ SOLN: 4.0000 mg | Freq: Four times a day (QID) | INTRAMUSCULAR | Status: DC | PRN

## 2019-04-22 MED ORDER — IOHEXOL 350 MG/ML SOLN
80.0000 mL | Freq: Once | INTRAVENOUS | Status: AC | PRN
  Administered 2019-04-22: 80 mL via INTRAVENOUS

## 2019-04-22 MED ORDER — MIRTAZAPINE 15 MG PO TABS: 15.0000 mg | ORAL_TABLET | Freq: Every day | ORAL | Status: DC

## 2019-04-22 MED ORDER — INSULIN GLARGINE (2 UNIT DIAL) 300 UNIT/ML ~~LOC~~ SOPN: 28.0000 [IU]/d | PEN_INJECTOR | Freq: Every morning | SUBCUTANEOUS | Status: DC

## 2019-04-22 MED ORDER — ALBUTEROL SULFATE HFA 108 (90 BASE) MCG/ACT IN AERS: 2.0000 | INHALATION_SPRAY | Freq: Four times a day (QID) | RESPIRATORY_TRACT | Status: DC | PRN

## 2019-04-22 MED ORDER — LEVOFLOXACIN IN D5W 750 MG/150ML IV SOLN
750.0000 mg | Freq: Once | INTRAVENOUS | Status: AC
  Administered 2019-04-22: 750 mg via INTRAVENOUS
  Filled 2019-04-22: qty 150

## 2019-04-22 NOTE — Telephone Encounter (Signed)
Dr. [name] update:  Office scheduling here Sharl Ma) says your schedule is being cleared for June 15th so this patient will be rescheduled.  Just update if we need to seek more records for you. Thank you

## 2019-04-22 NOTE — ED Notes (Signed)
The pt has started coughing and is coughing up bright red blood

## 2019-04-22 NOTE — Consult Note (Addendum)
ER Consult Note   Travis [name]:[id number] DOB: [birth year] DOA: 04/21/2019  PCP: [name], MD Consultants:  [facility] - nephrology; [facility] - cardiology; [name] - pulmonology; [facility] - oncology; Erlinda Hong - neurology Patient coming from:  Home - lives with wife; NOK: Wife, [phone]  Chief Complaint: hemoptysis  HPI: Travis [name] is a 68 y.o. male with medical history significant of COPD; tobacco dependence; CAD s/p stent; stage 2-3 CKD; stage 1a SCC of the lung s/p radiation therapy; HTN; HLD; DM; recent hospitalization for CVA; and factor V leiden deficiency with h/o DVT/PE on Eliquis presenting with hemoptysis.  He was thought to have a blood clot that caused a stroke, "they cured that, the blood clots are still there, they couldn't decide if it was blood clots or PNA."  Hemoptysis yesterday, straight blood, no pain involved.  He has had 2 episodes today and 1 episode 2 days ago.  No SOB.  +weight loss with the stroke, his appetite went away.  +night sweats.  No LAD.  He was hospitalized from 5/4-8 with acute right cerebellar CVA and was discharged to [facility], where he remained through 5/16.  ED Course:  Carryover, per Dr. Alcario Drought:  68 yo M with h/o previous lung CA. PE and DVT on eliquis due to factor 5 leiden. Came in with hemoptysis. CTA shows recurrent lung CA. Progressive R hilar tumor mass. Post obstructive pneumonitis, progressive bronchial dz. B lower lobe opacities probably pneumonitis though cant exclude infection. EDP going to cover with ABx.  Review of Systems: As per HPI; otherwise review of systems reviewed and negative.   Ambulatory Status:  Ambulates with a walker following recent CVA  Past Medical History:  Diagnosis Date   Cancer ([place])    skin cancer face   Cerebellar stroke ([place]) 03/2019   COPD (chronic obstructive pulmonary disease) ([place])    Coronary artery disease    PCA 1994l; PCI august 2003 of an obtust marginal. Catheterization at the time reveal:  left main normal; LAD 50-75% between first and second diagonals, 50% after lesion; left circumflex 95% in obtuse marg. RCA 100% proximal.   Diabetes mellitus    type 2   Factor V deficiency ([place]) 03/2019   DVT/PE   Hyperlipemia    Hypertension    Kidney disease    chronic; stage 2 followed By Dr Deterding   Osteoarthritis    Peripheral vascular disease ([place])    Squamous cell carcinoma of lung, stage I ([place]) 2019   Tobacco abuse     Past Surgical History:  Procedure Laterality Date   APPENDECTOMY     CHOLECYSTECTOMY     CORONARY ANGIOPLASTY     stent times 2, last cath 3 years ago   [place] N/A 06/09/2018   Procedure: possible, PLACEMENT OF FUDUCIAL;  Surgeon: Grace Isaac, MD;  Location: [place];  Service: Thoracic;  Laterality: N/A;   NERVE EXPLORATION  07/29/2012   Procedure: NERVE EXPLORATION;  Surgeon: Schuyler Amor, MD;  Location: [place];  Service: Orthopedics;  Laterality: Left;  Exploration /repair nerve and tendon left thumb   PERIPHERAL VASCULAR CATHETERIZATION N/A 04/26/2015   Procedure: Abdominal Aortogram;  Surgeon: Rosetta Posner, MD;  Location: [place] [facility];  Service: Cardiovascular;  Laterality: N/A;   VIDEO BRONCHOSCOPY N/A 06/09/2018   Procedure: VIDEO BRONCHOSCOPY;  Surgeon: Grace Isaac, MD;  Location: [place];  Service: Thoracic;  Laterality: N/A;   VIDEO BRONCHOSCOPY WITH ENDOBRONCHIAL NAVIGATION N/A 06/09/2018  Procedure: VIDEO BRONCHOSCOPY WITH ENDOBRONCHIAL NAVIGATION;  Surgeon: Grace Isaac, MD;  Location[facility];  Service: Thoracic;  Laterality: N/A;    Social History   Socioeconomic History   Marital status: Married    Spouse name: Not on file   Number of children: 3   Years of education: Not on file   Highest education level: Not on file  Occupational History   Not on file  Social Needs   Financial resource strain: Not hard at all   Food insecurity    Worry: Never true     Inability: Never true   Transportation needs    Medical: Not on file    Non-medical: Not on file  Tobacco Use   Smoking status: Former Smoker    Packs/day: 0.50    Years: 50.00    Pack years: 25.00    Types: Cigarettes    Quit date: 05/09/2015    Years since quitting: 3.9   Smokeless tobacco: Former Systems developer    Types: Chew    Quit date: 2016  Substance and Sexual Activity   Alcohol use: No    Alcohol/week: 0.0 standard drinks   Drug use: No   Sexual activity: Not on file  Lifestyle   Physical activity    Days per week: Not on file    Minutes per session: Not on file   Stress: Not on file  Relationships   Social connections    Talks on phone: Not on file    Gets together: Not on file    Attends religious service: Not on file    Active member of club or organization: Not on file    Attends meetings of clubs or organizations: Not on file    Relationship status: Not on file   Intimate partner violence    Fear of current or ex partner: Not on file    Emotionally abused: Not on file    Physically abused: Not on file    Forced sexual activity: Not on file  Other Topics Concern   Not on file  Social History Narrative   Has a 40-pack-year smoking history and currently smokes 1/2 pack per day.    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    Allergies  Allergen Reactions   Penicillins Other (See Comments)    UNSPECIFIED CHILDHOOD REACTION Has patient had PCN reaction causing immediate rash, facial/tongue/throat swelling, SOB or lightheadedness with hypotension: Unknown Has patient had a PCN reaction causing severe rash involving mucus membranes or skin necrosis: Unknown Has patient had a PCN reaction that required hospitalization: No Has patient had a PCN reaction occurring within the last 10 years: No If all of the above answers are "NO", then may proceed with Cephalosporin use.     Family History  Problem Relation Age of Onset   Coronary artery  disease Mother    Heart disease Mother    Cancer Father    Stroke Other    Heart failure Other    Pulmonary embolism Other    Diabetes Brother    Hypertension Brother    Heart attack Brother    Peripheral vascular disease Brother    Heart disease Brother    Hypertension Sister     Prior to Admission medications   Medication Sig Start Date End Date Taking? Authorizing Provider  acetaminophen (TYLENOL) 325 MG tablet Take 2 tablets (650 mg total) by mouth every 4 (four) hours as needed for mild pain (or temp > 37.5 C (99.5  F)). 03/19/19   Lavina Hamman, MD  albuterol [facility] HFA) 108 (90 Base) MCG/ACT inhaler Inhale 2 puffs into the lungs every 6 (six) hours as needed for wheezing or shortness of breath. 03/26/19   Angiulli, Lavon Paganini, PA-C  allopurinol (ZYLOPRIM) 100 MG tablet Take 1 tablet (100 mg total) by mouth at bedtime. 03/26/19   Angiulli, Lavon Paganini, PA-C  amLODipine (NORVASC) 10 MG tablet Take 1 tablet (10 mg total) by mouth daily. 03/26/19   Angiulli, Lavon Paganini, PA-C  apixaban (ELIQUIS) 5 MG TABS tablet Take 1 tablet (5 mg total) by mouth 2 (two) times daily. 03/26/19   Angiulli, Lavon Paganini, PA-C  aspirin EC 81 MG tablet Take 1 tablet (81 mg total) by mouth daily. 12/10/18   Burnell Blanks, MD  benazepril (LOTENSIN) 40 MG tablet Take 1 tablet (40 mg total) by mouth at bedtime. 03/26/19   Angiulli, Lavon Paganini, PA-C  benzonatate (TESSALON) 100 MG capsule Take 1 capsule (100 mg total) by mouth 3 (three) times daily as needed for cough. 03/26/19   Angiulli, Lavon Paganini, PA-C  calcitRIOL (ROCALTROL) 0.25 MCG capsule Take 1 capsule (0.25 mcg total) by mouth daily. 03/26/19   Angiulli, Lavon Paganini, PA-C  chlorpheniramine-HYDROcodone (TUSSIONEX PENNKINETIC ER) 10-8 MG/5ML SUER Take 5 mLs by mouth 2 (two) times daily. 04/07/19   Martyn Ehrich, NP  Cinnamon 500 MG capsule Take 500 mg by mouth daily.    [provider]  dextromethorphan (DELSYM) 30 MG/5ML liquid Take 2.5 mLs (15 mg  total) by mouth 2 (two) times daily. 03/26/19   Angiulli, Lavon Paganini, PA-C  guaiFENesin (MUCINEX) 600 MG 12 hr tablet Take 1 tablet (600 mg total) by mouth 2 (two) times daily. 03/19/19   Lavina Hamman, MD  HYDROcodone-homatropine (HYDROMET) 5-1.5 MG/5ML syrup Take 5 mLs by mouth at bedtime as needed for cough. 04/21/19   Magdalen Spatz, NP  Insulin Glargine, 2 Unit Dial, (TOUJEO MAX SOLOSTAR) 300 UNIT/ML SOPN Inject 28 Units/day into the skin every morning.    [provider]  Insulin Glargine, 2 Unit Dial, (TOUJEO MAX SOLOSTAR) 300 UNIT/ML SOPN Inject 11 Units/day into the skin every evening.    [provider]  levofloxacin (LEVAQUIN) 500 MG tablet Take 1 tablet (500 mg total) by mouth daily. 04/14/19   Martyn Ehrich, NP  metoprolol tartrate (LOPRESSOR) 100 MG tablet Take 1 tablet (100 mg total) by mouth 2 (two) times daily. 03/26/19   Angiulli, Lavon Paganini, PA-C  mirtazapine (REMERON) 15 MG tablet Take 1 tablet (15 mg total) by mouth at bedtime. 03/26/19   Cathlyn Parsons, PA-C    Physical Exam: Vitals:   04/22/19 0600 04/22/19 0615 04/22/19 0700 04/22/19 0900  BP: (!) 148/66 (!) 152/63  131/73  Pulse:      Resp: 20 18  (!) 25  Temp:      TempSrc:      SpO2:   96% 96%      General:  Appears calm and comfortable and is NAD  Eyes:  PERRL, EOMI, normal lids, iris  ENT:  grossly normal hearing, lips & tongue, mmm; appropriate dentition  Neck:  no LAD, masses or thyromegaly; no carotid bruits  Cardiovascular:  RRR, no m/r/g. No LE edema.   Respiratory:   CTA bilaterally with no wheezes/rales/rhonchi.  Normal respiratory effort.  Abdomen:  soft, NT, ND, NABS  Back:   normal alignment, no CVAT  Skin:  no rash or induration seen on limited exam  Musculoskeletal:  grossly normal tone BUE/BLE, good ROM, no bony abnormality  Psychiatric:  grossly normal mood and affect, speech fluent and appropriate, AOx3  Neurologic:  CN 2-12 grossly intact, moves all extremities  in coordinated fashion, sensation intact    Radiological Exams on Admission: Dg Chest 2 View  Result Date: 04/21/2019 CLINICAL DATA:  Hemoptysis EXAM: CHEST - 2 VIEW COMPARISON:  04/07/2019, 03/15/2019, 10/23/2018 CT chest 03/17/2019 FINDINGS: Left lung is clear. Similar appearance of bandlike opacities in the right upper lung and right lung base. Grossly stable right suprahilar architectural distortion and airspace disease. Stable cardiomediastinal silhouette with aortic atherosclerosis. No pneumothorax. IMPRESSION: Overall no significant interval change since 04/07/2019. Persistent architectural distortion and consolidation in the right suprahilar lung with bandlike opacity at the right base. Electronically Signed   By: Donavan Foil M.D.   On: 04/21/2019 20:03   Ct Angio Chest Pe W And/or Wo Contrast  Addendum Date: 04/22/2019   ADDENDUM REPORT: 04/22/2019 04:42 ADDENDUM: Left adrenal nodule is stable. Electronically Signed   By: San Morelle M.D.   On: 04/22/2019 04:42   Result Date: 04/22/2019 CLINICAL DATA:  Hemoptysis. Symptoms began last night. Lung cancer. EXAM: CT ANGIOGRAPHY CHEST WITH CONTRAST TECHNIQUE: Multidetector CT imaging of the chest was performed using the standard protocol during bolus administration of intravenous contrast. Multiplanar CT image reconstructions and MIPs were obtained to evaluate the vascular anatomy. CONTRAST:  31mL OMNIPAQUE IOHEXOL 350 MG/ML SOLN COMPARISON:  Two-view chest x-ray 04/21/2019. CT of the chest 06/04/2018 and 03/19/2019 FINDINGS: Cardiovascular: Heart is enlarged. Small pericardial effusion is present. Coronary artery calcifications are present including left main coronary artery. Atherosclerotic changes are noted in the aortic arch. Is no aneurysm. No significant stenosis is present at the great vessel origins. Additional calcifications are present at the aortic arch. Pulmonary artery opacification is excellent. No focal filling defects are  present to suggest pulmonary embolus. Mediastinum/Nodes: A 12 mm right paratracheal lymph node continues to increase in size. There is increased subcarinal and right hilar adenopathy which is narrowing the right mainstem bronchus. Subcarinal nodes now measure up to 1.6 cm. No significant left hilar adenopathy is present. No significant axillary nodes are present. There is some thickening of the distal esophagus without a significant mass. Lungs/Pleura: Ill-defined soft tissue mass obstructs the right upper lobe bronchus. Postobstructive pneumonitis present posteriorly surgical clips are present within the superior segment of the right lower lobe. Irregular soft tissue infiltrates the right middle lobe and lower lobe bronchi as well. Ill-defined right lower lobe airspace disease may represent postobstructive pneumonitis in the posterior basilar segment. Differential diagnosis includes infection. There is a similar appearance at the inferior dependent left lower lobe. Previously noted left lower lobe nodule is no longer present. A small right pleural effusion is present. Upper Abdomen: A new left adrenal nodule measures 1.4 cm. Atherosclerotic changes extending into the upper abdomen. No other focal lesions are present. Musculoskeletal: Stable degenerative changes are present in the thoracic spine. There is mild exaggerated kyphosis. No focal lytic or blastic lesions are present. Ribs are unremarkable. Review of the MIP images confirms the above findings. IMPRESSION: 1. Progressive right hilar tumor mass consistent with recurrent lung cancer. 2. Right upper lobe postobstructive pneumonitis 3. Progressive bronchial disease involving the right middle and lower lobe. 4. Ill-defined opacities in the lower lobes bilaterally a sub are similar the prior study, likely post radiation pneumonitis. Tumor or infection is included in the differential. 5. Small right pleural effusion. 6.  Aortic Atherosclerosis (  ICD10-I70.0).  Electronically Signed: By: San Morelle M.D. On: 04/22/2019 04:20    EKG: Independently reviewed.  NSR with rate 62; nonspecific ST changes with no evidence of acute ischemia   Labs on Admission: I have personally reviewed the available labs and imaging studies at the time of the admission.  Pertinent labs:   Glucose 318 BUN 40/Creatinine 1.83/GFR 37; 27/1.3/56 on 5/11 Troponin 0.03, <0.03 INR 1.2 WBC 14.8 Hgb 12.5; 13.6 on 5/11 COVID negative today and 5/4  Assessment/Plan Principal Problem:   Hemoptysis Active Problems:   Essential hypertension, benign   CKD (chronic kidney disease) stage 3, GFR 30-59 ml/min (HCC)   Squamous cell carcinoma of lungs, bilateral (HCC)   Cerebellar stroke (HCC)   DVT, lower extremity, distal (HCC)   Uncontrolled diabetes mellitus ([place])   Hemoptysis -Patient with 3 episodes of hemoptysis -I spoke with Dr. Nelda Marseille; PCCM will consult -PCCM suggests that this may be due to excessive cough production and has recommended holding Mucinex -They are not overly concerned about progressive malignancy at this time and recommend outpatient f/u -I offered overnight observation for ongoing monitoring given multiple episodes and use of Eliquis, but the patient declined -Clearly, with his h/o weight loss and night sweats in conjunction with CT findings, he will need outpatient f/u with oncology as soon as possible  AKI on stage 3 CKD -As noted above, I encouraged overnight observation with IVF; the patient prefers outpatient management -Will need recheck as an outpatient to ensure this is improving  Recent CVA -Ambulating with a walker -Continue Eliquis, also on 81 mg ASA daily -Needs good RF control  HTN -Continue Amlodipine -Hold benazepril,Lasix   HLD Continue Lipitor  CAD ASA, statin, BB eliquis  DM -Continue Toujeo  -Recent A1c 8.1, indicating suboptimal control  Factor V Leiden deficiency with recent DVT -Acute deep vein  thrombosis involving the left posterior tibial vein -On apixaban,? Apixaban failure -May need to transition to lovenox butpatient currently refusing to transition from Eliquis. -Recommend outpatient follow-up with medical oncology.   Note: This patient has been tested and is negative for the novel coronavirus COVID-19.  Thank you for this ER consult.  The patient has been discharged from the ER as per his request, although overnight observation would have been reasonable.   Karmen Bongo MD [facility]   How to contact the [facility] Attending or Consulting provider [place] or covering provider during after hours [place], for this patient?  1. Check the care team in [facility] and look for a) attending/consulting TR[facility] provider listed and b) the [facility] team listed 2. Log into [email or web address] and use [place]'s universal password to access. If you do not have the password, please contact the hospital operator. 3. Locate the [facility] provider you are looking for under [facility] and page to a number that you can be directly reached. 4. If you still have difficulty reaching the provider, please page the [facility] (Director on Call) for the Hospitalists listed on amion for assistance.   04/22/2019, 10:32 AM

## 2019-04-22 NOTE — ED Notes (Signed)
ED TO INPATIENT HANDOFF REPORT  ED Nurse Name and Phone #:   S Name/Age/Gender Travis [name] 68 y.o. male Room/Bed: 038C/038C  Code Status   Code Status: Prior  Home/SNF/Other Home Patient oriented to: self, place, time and situation Is this baseline? Yes   Triage Complete: Triage complete  Chief Complaint Hemoptysis ??  Triage Note Pt reports 1 episode of hemoptysis today, pt takes a baby aspirin daily, denies any complaints. Pt a.o, nad noted   Allergies Allergies  Allergen Reactions  . Penicillins Other (See Comments)    UNSPECIFIED CHILDHOOD REACTION Has patient had PCN reaction causing immediate rash, facial/tongue/throat swelling, SOB or lightheadedness with hypotension: Unknown Has patient had a PCN reaction causing severe rash involving mucus membranes or skin necrosis: Unknown Has patient had a PCN reaction that required hospitalization: No Has patient had a PCN reaction occurring within the last 10 years: No If all of the above answers are "NO", then may proceed with Cephalosporin use.     Level of Care/Admitting Diagnosis ED Disposition    ED Disposition Condition Comment   Admit  The patient appears reasonably stabilized for admission considering the current resources, flow, and capabilities available in the ED at this time, and I doubt any other [facility] requiring further screening and/or treatment in the ED prior to admission is  present.       B Medical/Surgery History Past Medical History:  Diagnosis Date  . CAD (coronary artery disease)   . Cancer ([place])    skin cancer face  . COPD (chronic obstructive pulmonary disease) ([place])   . Coronary artery disease    PCA 1994l; PCI august 2003 of an obtust marginal. Catheterization at the time reveal: left main normal; LAD 50-75% between first and second diagonals, 50% after lesion; left circumflex 95% in obtuse marg. RCA 100% proximal.  . Diabetes mellitus    type 2  . Hyperlipemia   . Hypertension   .  Kidney disease    chronic; stage 2 followed By Dr Deterding  . Myocardial infarction ([place])   . Osteoarthritis   . Peripheral vascular disease ([place])   . Tobacco abuse    Past Surgical History:  Procedure Laterality Date  . APPENDECTOMY    . CHOLECYSTECTOMY    . CORONARY ANGIOPLASTY     stent times 2, last cath 3 years ago  . FUDUCIAL PLACEMENT N/A 06/09/2018   Procedure: possible, PLACEMENT OF FUDUCIAL;  Surgeon: Grace Isaac, MD;  Location: [place];  Service: Thoracic;  Laterality: N/A;  . NERVE EXPLORATION  07/29/2012   Procedure: NERVE EXPLORATION;  Surgeon: Schuyler Amor, MD;  Location: [place];  Service: Orthopedics;  Laterality: Left;  Exploration /repair nerve and tendon left thumb  . PERIPHERAL VASCULAR CATHETERIZATION N/A 04/26/2015   Procedure: Abdominal Aortogram;  Surgeon: Rosetta Posner, MD;  Location: [place] [facility];  Service: Cardiovascular;  Laterality: N/A;  . VIDEO BRONCHOSCOPY N/A 06/09/2018   Procedure: VIDEO BRONCHOSCOPY;  Surgeon: Grace Isaac, MD;  Location: [place];  Service: Thoracic;  Laterality: N/A;  . VIDEO BRONCHOSCOPY WITH ENDOBRONCHIAL NAVIGATION N/A 06/09/2018   Procedure: VIDEO BRONCHOSCOPY WITH ENDOBRONCHIAL NAVIGATION;  Surgeon: Grace Isaac, MD;  Location[facility];  Service: Thoracic;  Laterality: N/A;     A IV Location/Drains/Wounds Patient Lines/Drains/Airways Status   Active Line/Drains/Airways    Name:   Placement date:   Placement time:   Site:   Days:   Peripheral IV 04/22/19 Left Antecubital  04/22/19    0034    Antecubital   less than 1          Intake/Output Last 24 hours No intake or output data in the 24 hours ending 04/22/19 0732  Labs/Imaging Results for orders placed or performed during the hospital encounter of 04/21/19 (from the past 48 hour(s))  Type and screen [name]     Status: None   Collection Time: 04/21/19  7:00 PM  Result Value Ref Range   ABO/RH(D) O NEG     Antibody Screen NEG    Sample Expiration      04/24/2019,2359 Performed at [facility], [place] [address], [place], [place] [address]   ABO/Rh     Status: None   Collection Time: 04/21/19  7:00 PM  Result Value Ref Range   ABO/RH(D)      O NEG Performed at [facility] [address], [place], [place] [address]   CBC     Status: Abnormal   Collection Time: 04/21/19  7:19 PM  Result Value Ref Range   WBC 14.8 (H) 4.0 - 10.5 K/uL   RBC 4.76 4.22 - 5.81 MIL/uL   Hemoglobin 12.5 (L) 13.0 - 17.0 g/dL   HCT 39.7 39.0 - 52.0 %   MCV 83.4 80.0 - 100.0 fL   MCH 26.3 26.0 - 34.0 pg   MCHC 31.5 30.0 - 36.0 g/dL   RDW 14.6 11.5 - 15.5 %   Platelets 278 150 - 400 K/uL   nRBC 0.0 0.0 - 0.2 %    Comment: Performed at [facility], [place] [address] [place], [place] [address]  Basic metabolic panel     Status: Abnormal   Collection Time: 04/21/19 11:54 PM  Result Value Ref Range   Sodium 134 (L) 135 - 145 mmol/L   Potassium 4.8 3.5 - 5.1 mmol/L   Chloride 98 98 - 111 mmol/L   CO2 24 22 - 32 mmol/L   Glucose, Bld 318 (H) 70 - 99 mg/dL   BUN 40 (H) 8 - 23 mg/dL   Creatinine, Ser 1.83 (H) 0.61 - 1.24 mg/dL   Calcium 9.1 8.9 - 10.3 mg/dL   GFR calc non Af Amer 37 (L) >60 mL/min   GFR calc Af Amer 42 (L) >60 mL/min   Anion gap 12 5 - 15    Comment: Performed at [facility] [address] [place], [place] [address]  Troponin I - ONCE - STAT     Status: Abnormal   Collection Time: 04/21/19 11:54 PM  Result Value Ref Range   Troponin I 0.03 (HH) <0.03 ng/mL    Comment: CRITICAL RESULT CALLED TO, READ BACK BY AND VERIFIED WITH: CHILTON,L RN 04/22/2019 0228 [name] Performed at [facility], [place] [address], [place], [place] [address]   Troponin I - ONCE - STAT     Status: None   Collection Time: 04/22/19  3:56 AM  Result Value Ref Range   Troponin I <0.03 <0.03 ng/mL    Comment: Performed at [facility] [address] [place], [place] [address]   Protime-INR     Status: Abnormal   Collection Time: 04/22/19  3:56 AM  Result Value Ref Range   Prothrombin Time 15.3 (H) 11.4 - 15.2 seconds   INR 1.2 0.8 - 1.2    Comment: (NOTE) INR goal varies based on device and disease states. Performed at [facility],  [address], [place], [place] [address]   SARS Coronavirus 2 (CEPHEID - Performed in [facility] hospital lab), Hosp Order     Status: None   Collection Time: 04/22/19  4:05 AM   Specimen: Nasopharyngeal Swab  Result Value Ref Range   SARS Coronavirus 2 NEGATIVE NEGATIVE    Comment: (NOTE) If result is NEGATIVE SARS-CoV-2 target nucleic acids are NOT DETECTED. The SARS-CoV-2 RNA is generally detectable in upper and lower  respiratory specimens during the acute phase of infection. The lowest  concentration of SARS-CoV-2 viral copies this assay can detect is 250  copies / mL. A negative result does not preclude SARS-CoV-2 infection  and should not be used as the sole basis for treatment or other  patient management decisions.  A negative result may occur with  improper specimen collection / handling, submission of specimen other  than nasopharyngeal swab, presence of viral mutation(s) within the  areas targeted by this assay, and inadequate number of viral copies  (<250 copies / mL). A negative result must be combined with clinical  observations, patient history, and epidemiological information. If result is POSITIVE SARS-CoV-2 target nucleic acids are DETECTED. The SARS-CoV-2 RNA is generally detectable in upper and lower  respiratory specimens dur ing the acute phase of infection.  Positive  results are indicative of active infection with SARS-CoV-2.  Clinical  correlation with patient history and other diagnostic information is  necessary to determine patient infection status.  Positive results do  not rule out bacterial infection or co-infection with other viruses. If result is PRESUMPTIVE POSTIVE SARS-CoV-2  nucleic acids MAY BE PRESENT.   A presumptive positive result was obtained on the submitted specimen  and confirmed on repeat testing.  While 2019 novel coronavirus  (SARS-CoV-2) nucleic acids may be present in the submitted sample  additional confirmatory testing may be necessary for epidemiological  and / or clinical management purposes  to differentiate between  SARS-CoV-2 and other Sarbecovirus currently known to infect humans.  If clinically indicated additional testing with an alternate test  methodology [phone]) is advised. The SARS-CoV-2 RNA is generally  detectable in upper and lower respiratory sp ecimens during the acute  phase of infection. The expected result is Negative. Fact Sheet for Patients:  StrictlyIdeas.no Fact Sheet for Healthcare Providers: BankingDealers.co.za This test is not yet approved or cleared by the [place] FDA and has been authorized for detection and/or diagnosis of SARS-CoV-2 by FDA under an Emergency Use Authorization (EUA).  This EUA will remain in effect (meaning this test can be used) for the duration of the COVID-19 declaration under Section 564(b)(1) of the Act, 21 U.S.C. section 360bbb-3(b)(1), unless the authorization is terminated or revoked sooner. Performed at [facility], [place] [address] [place], [place] [address]    Dg Chest 2 View  Result Date: 04/21/2019 CLINICAL DATA:  Hemoptysis EXAM: CHEST - 2 VIEW COMPARISON:  04/07/2019, 03/15/2019, 10/23/2018 CT chest 03/17/2019 FINDINGS: Left lung is clear. Similar appearance of bandlike opacities in the right upper lung and right lung base. Grossly stable right suprahilar architectural distortion and airspace disease. Stable cardiomediastinal silhouette with aortic atherosclerosis. No pneumothorax. IMPRESSION: Overall no significant interval change since 04/07/2019. Persistent architectural distortion and consolidation in the right  suprahilar lung with bandlike opacity at the right base. Electronically Signed   By: Donavan Foil M.D.   On: 04/21/2019 20:03   Ct Angio Chest Pe W And/or Wo Contrast  Addendum Date: 04/22/2019   ADDENDUM REPORT: 04/22/2019  04:42 ADDENDUM: Left adrenal nodule is stable. Electronically Signed   By: San Morelle M.D.   On: 04/22/2019 04:42   Result Date: 04/22/2019 CLINICAL DATA:  Hemoptysis. Symptoms began last night. Lung cancer. EXAM: CT ANGIOGRAPHY CHEST WITH CONTRAST TECHNIQUE: Multidetector CT imaging of the chest was performed using the standard protocol during bolus administration of intravenous contrast. Multiplanar CT image reconstructions and MIPs were obtained to evaluate the vascular anatomy. CONTRAST:  21mL OMNIPAQUE IOHEXOL 350 MG/ML SOLN COMPARISON:  Two-view chest x-ray 04/21/2019. CT of the chest 06/04/2018 and 03/19/2019 FINDINGS: Cardiovascular: Heart is enlarged. Small pericardial effusion is present. Coronary artery calcifications are present including left main coronary artery. Atherosclerotic changes are noted in the aortic arch. Is no aneurysm. No significant stenosis is present at the great vessel origins. Additional calcifications are present at the aortic arch. Pulmonary artery opacification is excellent. No focal filling defects are present to suggest pulmonary embolus. Mediastinum/Nodes: A 12 mm right paratracheal lymph node continues to increase in size. There is increased subcarinal and right hilar adenopathy which is narrowing the right mainstem bronchus. Subcarinal nodes now measure up to 1.6 cm. No significant left hilar adenopathy is present. No significant axillary nodes are present. There is some thickening of the distal esophagus without a significant mass. Lungs/Pleura: Ill-defined soft tissue mass obstructs the right upper lobe bronchus. Postobstructive pneumonitis present posteriorly surgical clips are present within the superior segment of the right lower lobe.  Irregular soft tissue infiltrates the right middle lobe and lower lobe bronchi as well. Ill-defined right lower lobe airspace disease may represent postobstructive pneumonitis in the posterior basilar segment. Differential diagnosis includes infection. There is a similar appearance at the inferior dependent left lower lobe. Previously noted left lower lobe nodule is no longer present. A small right pleural effusion is present. Upper Abdomen: A new left adrenal nodule measures 1.4 cm. Atherosclerotic changes extending into the upper abdomen. No other focal lesions are present. Musculoskeletal: Stable degenerative changes are present in the thoracic spine. There is mild exaggerated kyphosis. No focal lytic or blastic lesions are present. Ribs are unremarkable. Review of the MIP images confirms the above findings. IMPRESSION: 1. Progressive right hilar tumor mass consistent with recurrent lung cancer. 2. Right upper lobe postobstructive pneumonitis 3. Progressive bronchial disease involving the right middle and lower lobe. 4. Ill-defined opacities in the lower lobes bilaterally a sub are similar the prior study, likely post radiation pneumonitis. Tumor or infection is included in the differential. 5. Small right pleural effusion. 6.  Aortic Atherosclerosis (ICD10-I70.0). Electronically Signed: By: San Morelle M.D. On: 04/22/2019 04:20    Pending Labs Unresulted Labs (From admission, onward)    Start     Ordered   04/22/19 0448  Blood culture (routine x 2)  BLOOD CULTURE X 2,   STAT     04/22/19 0448          Vitals/Pain Today's Vitals   04/22/19 0515 04/22/19 0545 04/22/19 0600 04/22/19 0615  BP: (!) 156/61 (!) 153/69 (!) 148/66 (!) 152/63  Pulse:      Resp: (!) 22 17 20 18   Temp:      TempSrc:      SpO2:      PainSc:        Isolation Precautions No active isolations  Medications Medications  0.9 %  sodium chloride infusion ( Intravenous New Bag/Given 04/22/19 0700)  levofloxacin  (LEVAQUIN) IVPB 750 mg (750 mg Intravenous New Bag/Given 04/22/19 0659)  sodium chloride 0.9 %  bolus 500 mL (0 mLs Intravenous Stopped 04/22/19 0106)  iohexol (OMNIPAQUE) 350 MG/ML injection 80 mL (80 mLs Intravenous Contrast Given 04/22/19 0320)    Mobility walks Low fall risk   Focused Assessments Pulmonary Assessment Handoff:  Lung sounds:   O2 Device: Room Air        R Recommendations: See Admitting Provider Note  Report given to:   Additional Notes:

## 2019-04-22 NOTE — ED Notes (Addendum)
Breakfast tray ordered 

## 2019-04-22 NOTE — ED Notes (Signed)
Admitting at bedside 

## 2019-04-22 NOTE — ED Notes (Signed)
See ED flow sheet paper form for hourly rounding + vitals and care given between hours of 0100 - 0245.

## 2019-04-22 NOTE — Discharge Instructions (Addendum)
Follow up with your primary care doctor.  If your bleeding gets worse stop your Eliquis

## 2019-04-22 NOTE — Consult Note (Addendum)
NAME:  Travis [name], MRN:  [id number], DOB:  04-11-1951, LOS: 0 ADMISSION DATE:  04/21/2019, CONSULTATION DATE:  6/11 REFERRING MD:  Lorin Mercy - triad, CHIEF COMPLAINT:  hemoptysis  Brief History   68 yo M with persistent cough, two episodes of small volume bright red hemoptysis   History of present illness   68 yo M PMH significant for IA NSCLC squamous cell carcinoma s/p radiation, CVA, Factor V Leiden, DVT and PE on Eliquis, who began exhibiting bright red hemoptysis on 6/10. Associated symptoms include persistent cough, for which patient has previously been advised to stop Mucinex per multiple pulmonary notes. Of note patient was recently seen by [facility] 5/11 for hemoptysis The patient called after-hours [facility] on 6/10 at which time his Wells score was calculated as 3.5. He was referred to ED to rule out PE. CT angio chest does not reveal PE but does show progressive R hilar tumor, RUL postobstructive pneumonitis, RML RLL bronchial disease.  Pt is HDS. Hgb >12. Slight Cr elevation from baseline.   PCCM consulted RE hemoptysis   Past Medical History  IA NSCLC squamous cell carcinoma Tobacco abuse, former CAD s/p PCA x2 DMII DVT, PE, on eliquis CKD III COPD   Significant Hospital Events   6/11 placed in obs  Consults:  PCCM  Procedures:    Significant Diagnostic Tests:  6/11 CT angio chest> negative for PE. Progressive R hilar tumor suggestive of lung cancer recurrence, RUL postobstructive pneumonitis, RML RLL bronchial disease, small R pleural effusion, bilateral lower lobe opacities.   Micro Data:  6/11 SARS Cov2- negative   Antimicrobials:    Interim history/subjective:  CTAngio chest negative for PE Patient to obs   Objective   Blood pressure (!) 152/63, pulse (!) 58, temperature 98.4 F (36.9 C), temperature source Oral, resp. rate 18, SpO2 94 %.       No intake or output data in the 24 hours ending 04/22/19 0844 There were no vitals filed for this visit.   Examination: General: WDWN older adult M, reclined in stretcher NAD HENT: NCAT. Trachea midline. Anicteric sclera. Pink mm. No obvious active or frank bleeding of oro/nasopharynx  Lungs: CTA bilaterally. No accessory muscle recruitment. Symmetrical chest expansion Cardiovascular: RRR s1s2 no rgm 2+ radial pulses Abdomen: Protuberant, soft. Ndnt. normaoctive x4  Extremities: Symmetrical bulk and tone. No cyanosis no clubbing, no obvious deformity Neuro: AAOx3. Following commands.  GU: due to void   Resolved Hospital Problem list     Assessment & Plan:   Discussed with Dr. Nelda Marseille PCCM and Dr. Lorin [facility]  Hemoptysis -possibly in setting of known squamous cell carcinoma and ongoing use of Mucinex  -CTA chest did not show evidence of PE -Eliquis 5mg  BID at home for hx DVT, PE   -Hgb 12.5 P Recommend cough suppression with delsym q12, PRN tessalon and tussionex, however try to limit CNS depressing medications in setting of CVA history Will discontinue mucinex  If large volume hemoptysis could consider IR evaluation however hemoptysis seems to be intermittent, very scant amount, and associated with cough Would recommend outpatient follow up with Pulmonary Would recheck H/H to ensure stable     Thank you for consulting PCCM. At this time we will sign off. Please re-engage if needed.   Rest Per Primary   Best practice:  Diet: heart healthy/carb modified  Pain/Anxiety/Delirium protocol (if indicated): APAP VAP protocol (if indicated): n/a DVT prophylaxis: eliquis GI prophylaxis: n/a Glucose control: SSI Mobility: assist Code Status: DNR Family  Communication: patient updated  Disposition: Observation  Labs   CBC: Recent Labs  Lab 04/21/19 1919  WBC 14.8*  HGB 12.5*  HCT 39.7  MCV 83.4  PLT 275    Basic Metabolic Panel: Recent Labs  Lab 04/21/19 2354  NA 134*  K 4.8  CL 98  CO2 24  GLUCOSE 318*  BUN 40*  CREATININE 1.83*  CALCIUM 9.1   GFR: Estimated  Creatinine Clearance: 43.8 mL/min (A) (by C-G formula based on SCr of 1.83 mg/dL (H)). Recent Labs  Lab 04/21/19 1919  WBC 14.8*    Liver Function Tests: No results for input(s): AST, ALT, ALKPHOS, BILITOT, PROT, ALBUMIN in the last 168 hours. No results for input(s): LIPASE, AMYLASE in the last 168 hours. No results for input(s): AMMONIA in the last 168 hours.  ABG    Component Value Date/Time   TCO2 21 04/26/2015 1031     Coagulation Profile: Recent Labs  Lab 04/22/19 0356  INR 1.2    Cardiac Enzymes: Recent Labs  Lab 04/21/19 2354 04/22/19 0356  TROPONINI 0.03* <0.03    HbA1C: Hgb A1c MFr Bld  Date/Time Value Ref Range Status  03/16/2019 06:17 AM 8.1 (H) 4.8 - 5.6 % Final    Comment:    (NOTE) Pre diabetes:          5.7%-6.4% Diabetes:              >6.4% Glycemic control for   <7.0% adults with diabetes   10/26/2009 03:30 PM (H) 4.6 - 6.1 % Final   6.7 (NOTE) The ADA recommends the following therapeutic goal for glycemic control related to Hgb A1c measurement: Goal of therapy: <6.5 Hgb A1c  Reference: American Diabetes Association: Clinical Practice Recommendations 2010, Diabetes Care, 2010, 33: (Suppl  1).    CBG: No results for input(s): GLUCAP in the last 168 hours.  Review of Systems:   Endorses cough, endorses bright red small volume hemoptysis. Denies post nasal drip, denies runny nose, denies SOB Denies fever/chills/weight loss, sick contact, fatigue Denies chest pain, palpitation. Denies extremity edema Endorses recent back pain. Denies joint pain, denies muscle pain or weakness Denies n/v/c/d. Denies blood in stool. Denies hematemesis  Denies HA, dizziness, confusion, LOC   Past Medical History  He,  has a past medical history of Cancer ([place]), Cerebellar stroke ([place]) (03/2019), COPD (chronic obstructive pulmonary disease) ([place]), Coronary artery disease, Diabetes mellitus, Factor V deficiency ([place]) (03/2019), Hyperlipemia, Hypertension, Kidney  disease, Osteoarthritis, Peripheral vascular disease ([place]), Squamous cell carcinoma of lung, stage I ([place]) (2019), and Tobacco abuse.   Surgical History    Past Surgical History:  Procedure Laterality Date  . APPENDECTOMY    . CHOLECYSTECTOMY    . CORONARY ANGIOPLASTY     stent times 2, last cath 3 years ago  . FUDUCIAL PLACEMENT N/A 06/09/2018   Procedure: possible, PLACEMENT OF FUDUCIAL;  Surgeon: Grace Isaac, MD;  Location: [facility];  Service: Thoracic;  Laterality: N/A;  . NERVE EXPLORATION  07/29/2012   Procedure: NERVE EXPLORATION;  Surgeon: Schuyler Amor, MD;  Location: [facility];  Service: Orthopedics;  Laterality: Left;  Exploration /repair nerve and tendon left thumb  . PERIPHERAL VASCULAR CATHETERIZATION N/A 04/26/2015   Procedure: Abdominal Aortogram;  Surgeon: Rosetta Posner, MD;  Location: [facility];  Service: Cardiovascular;  Laterality: N/A;  . VIDEO BRONCHOSCOPY N/A 06/09/2018   Procedure: VIDEO BRONCHOSCOPY;  Surgeon: Grace Isaac, MD;  Location: [facility];  Service: Thoracic;  Laterality: N/A;  . VIDEO BRONCHOSCOPY WITH ENDOBRONCHIAL NAVIGATION N/A 06/09/2018   Procedure: VIDEO BRONCHOSCOPY WITH ENDOBRONCHIAL NAVIGATION;  Surgeon: Grace Isaac, MD;  Location: [facility];  Service: Thoracic;  Laterality: N/A;     Social History   reports that he quit smoking about 3 years ago. His smoking use included cigarettes. He has a 25.00 pack-year smoking history. He quit smokeless tobacco use about 4 years ago.  His smokeless tobacco use included chew. He reports that he does not drink alcohol or use drugs.   Family History   His family history includes Cancer in his father; Coronary artery disease in his mother; Diabetes in his brother; Heart attack in his brother; Heart disease in his brother and mother; Heart failure in an other family member; Hypertension in his brother and sister; Peripheral vascular disease in his brother; Pulmonary embolism in  an other family member; Stroke in an other family member.   Allergies Allergies  Allergen Reactions  . Penicillins Other (See Comments)    UNSPECIFIED CHILDHOOD REACTION Has patient had PCN reaction causing immediate rash, facial/tongue/throat swelling, SOB or lightheadedness with hypotension: Unknown Has patient had a PCN reaction causing severe rash involving mucus membranes or skin necrosis: Unknown Has patient had a PCN reaction that required hospitalization: No Has patient had a PCN reaction occurring within the last 10 years: No If all of the above answers are "NO", then may proceed with Cephalosporin use.      Home Medications  Prior to Admission medications   Medication Sig Start Date End Date Taking? Authorizing Provider  acetaminophen (TYLENOL) 325 MG tablet Take 2 tablets (650 mg total) by mouth every 4 (four) hours as needed for mild pain (or temp > 37.5 C (99.5 F)). 03/19/19   Lavina Hamman, MD  albuterol [facility] Series HFA) 108 [phone] Base) MCG/ACT inhaler Inhale 2 puffs into the lungs every 6 (six) hours as needed for wheezing or shortness of breath. 03/26/19   Angiulli, Lavon Paganini, PA-C  allopurinol (ZYLOPRIM) 100 MG tablet Take 1 tablet (100 mg total) by mouth at bedtime. 03/26/19   Angiulli, Lavon Paganini, PA-C  amLODipine (NORVASC) 10 MG tablet Take 1 tablet (10 mg total) by mouth daily. 03/26/19   Angiulli, Lavon Paganini, PA-C  apixaban (ELIQUIS) 5 MG TABS tablet Take 1 tablet (5 mg total) by mouth 2 (two) times daily. 03/26/19   Angiulli, Lavon Paganini, PA-C  aspirin EC 81 MG tablet Take 1 tablet (81 mg total) by mouth daily. 12/10/18   Burnell Blanks, MD  benazepril (LOTENSIN) 40 MG tablet Take 1 tablet (40 mg total) by mouth at bedtime. 03/26/19   Angiulli, Lavon Paganini, PA-C  benzonatate (TESSALON) 100 MG capsule Take 1 capsule (100 mg total) by mouth 3 (three) times daily as needed for cough. 03/26/19   Angiulli, Lavon Paganini, PA-C  calcitRIOL (ROCALTROL) 0.25 MCG capsule Take 1 capsule (0.25 mcg  total) by mouth daily. 03/26/19   Angiulli, Lavon Paganini, PA-C  chlorpheniramine-HYDROcodone (TUSSIONEX PENNKINETIC ER) 10-8 MG/5ML SUER Take 5 mLs by mouth 2 (two) times daily. 04/07/19   Martyn Ehrich, NP  Cinnamon 500 MG capsule Take 500 mg by mouth daily.    [provider]  dextromethorphan (DELSYM) 30 MG/5ML liquid Take 2.5 mLs (15 mg total) by mouth 2 (two) times daily. 03/26/19   Angiulli, Lavon Paganini, PA-C  guaiFENesin (MUCINEX) 600 MG 12 hr tablet Take 1 tablet (600 mg total) by mouth 2 (two) times daily. 03/19/19   Posey Pronto,  Josetta Huddle, MD  HYDROcodone-homatropine (HYDROMET) 5-1.5 MG/5ML syrup Take 5 mLs by mouth at bedtime as needed for cough. 04/21/19   Magdalen Spatz, NP  Insulin Glargine, 2 Unit Dial, (TOUJEO MAX SOLOSTAR) 300 UNIT/ML SOPN Inject 28 Units/day into the skin every morning.    [provider]  Insulin Glargine, 2 Unit Dial, (TOUJEO MAX SOLOSTAR) 300 UNIT/ML SOPN Inject 11 Units/day into the skin every evening.    [provider]  levofloxacin (LEVAQUIN) 500 MG tablet Take 1 tablet (500 mg total) by mouth daily. 04/14/19   Martyn Ehrich, NP  metoprolol tartrate (LOPRESSOR) 100 MG tablet Take 1 tablet (100 mg total) by mouth 2 (two) times daily. 03/26/19   Angiulli, Lavon Paganini, PA-C  mirtazapine (REMERON) 15 MG tablet Take 1 tablet (15 mg total) by mouth at bedtime. 03/26/19   Arnegard, Lavon Paganini, PA-C   Eliseo Gum MSN, AGACNP-BC [place] [phone] If no answer, [phone] 04/22/2019, 8:44 AM  Attending Note:  68 year old male with history of stage IA NSCLC, CVA and factor V leiden who is chronically on Eliquis presenting with 2 episodes of hemoptysis.  Patient was seen yesterday in [facility] for hemoptysis and it was recommended to stop his mucinex and start tussionex and tessalon which he did not do.  He reports only 2 episodes of hemoptysis of minimal amount of blood but seeing his own blood scared him and he came  to the hospital.  He does not wish to stop he eliquis as he reports it is minimal.  I reviewed chest CT myself, no PE noted and lung mass/atelectasis is improving.  Discussed with PCCM-NP.  Hemoptysis: - Continue eliquis - Monitor clinically - If worsens will hold NOAC  Cough: - D/C mucinex  Lung cancer: - No need for radiation for now - Revert back to normal monitoring  PCCM will sign off, please call back if needed.  Patient seen and examined, agree with above note.  I dictated the care and orders written for this patient under my direction.  Rush Farmer, [place]

## 2019-04-23 DIAGNOSIS — E1165 Type 2 diabetes mellitus with hyperglycemia: Secondary | ICD-10-CM | POA: Diagnosis not present

## 2019-04-23 DIAGNOSIS — I469 Cardiac arrest, cause unspecified: Secondary | ICD-10-CM | POA: Diagnosis not present

## 2019-04-23 DIAGNOSIS — R404 Transient alteration of awareness: Secondary | ICD-10-CM | POA: Diagnosis not present

## 2019-04-23 DIAGNOSIS — R000 Tachycardia, unspecified: Secondary | ICD-10-CM | POA: Diagnosis not present

## 2019-04-26 ENCOUNTER — Ambulatory Visit: Payer: Medicare Other | Admitting: Pulmonary Disease

## 2019-04-27 LAB — CULTURE, BLOOD (ROUTINE X 2)
Culture: NO GROWTH
Culture: NO GROWTH
Special Requests: ADEQUATE
Special Requests: ADEQUATE

## 2019-05-06 ENCOUNTER — Ambulatory Visit: Payer: No Typology Code available for payment source | Admitting: Adult Health

## 2019-05-07 ENCOUNTER — Ambulatory Visit: Payer: Medicare Other | Admitting: Physical Medicine & Rehabilitation

## 2019-05-12 DIAGNOSIS — 419620001 Death: Secondary | SNOMED CT | POA: Diagnosis not present

## 2019-05-12 NOTE — Telephone Encounter (Signed)
Received message from Dr. [name] on patient death. Cancelled upcoming OV appt and notified Hyman Hopes of death so she can update system for cancellation of orders and update records for deceased.  Nothing further needed.

## 2019-05-12 NOTE — Telephone Encounter (Signed)
Contacted patient home number. Wife answered and informed me that patient passed away early this morning after having an episode of hemoptysis.  I offered my condolences.  Langley Gauss - please cancel all appointments and scans.

## 2019-05-12 DEATH — deceased

## 2019-05-13 ENCOUNTER — Other Ambulatory Visit: Payer: Medicare Other

## 2019-05-28 ENCOUNTER — Encounter: Payer: Self-pay | Admitting: *Deleted

## 2020-10-16 IMAGING — MR MRI HEAD WITHOUT CONTRAST
12 of 15 series · 34 of 48 positions shown · non-contrast
Comparison: Head CT same day

CLINICAL DATA: Dizziness.  Abnormal cord in a shin.

EXAM:
MRI HEAD WITHOUT CONTRAST
MRA HEAD WITHOUT CONTRAST
TECHNIQUE: Multiplanar, multiecho pulse sequences of the brain and surrounding
structures were obtained without intravenous contrast. Angiographic
images of the head were obtained using MRA technique without
contrast.

[Series 5: DWI · axial · 3.0mm · 0.88mm/px · z∈[-35,+110]mm · 6 of 100 slices shown (1 of 4)]
[im 1/100]
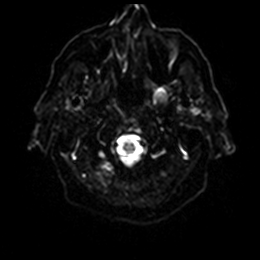
[im 20/100]
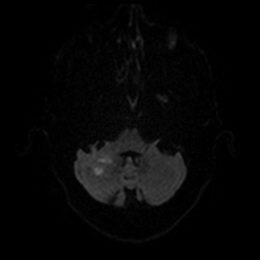
[im 40/100]
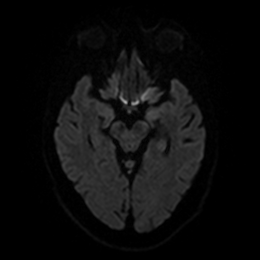
[im 60/100]
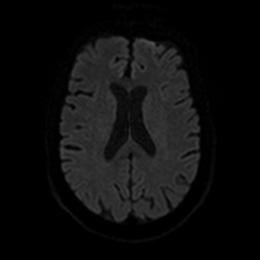
[im 80/100]
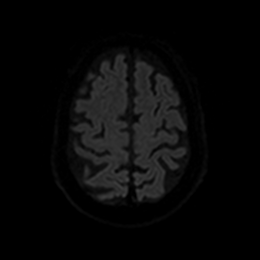
[im 100/100]
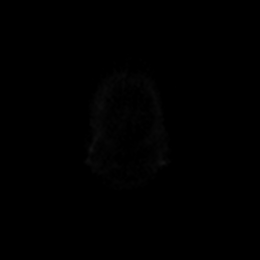

[Series 6: DWI · axial · 3.0mm · 0.88mm/px · z∈[-35,+110]mm · 3 of 50 slices shown (2 of 4)]
[im 1/50]
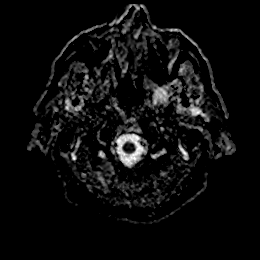
[im 25/50]
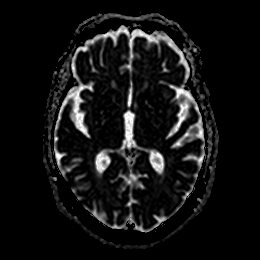
[im 50/50]
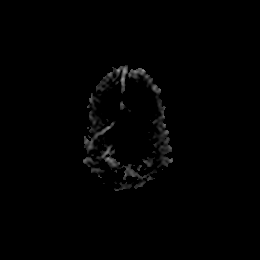

[Series 7: DWI · coronal · 4.0mm · 0.88mm/px · 4 of 70 slices shown (3 of 4)]
[im 1/70]
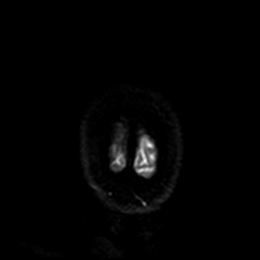
[im 24/70]
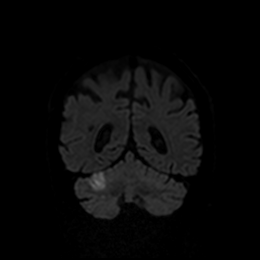
[im 47/70]
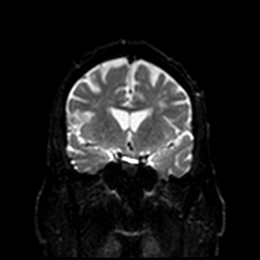
[im 70/70]
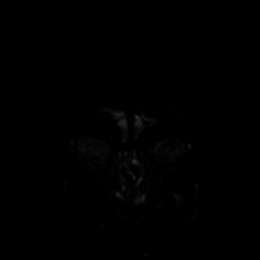

[Series 8: DWI · coronal · 4.0mm · 0.88mm/px · 2 of 35 slices shown (4 of 4)]
[im 1/35]
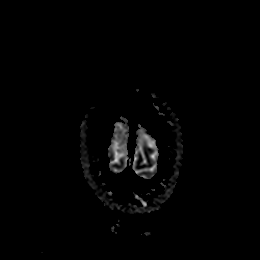
[im 35/35]
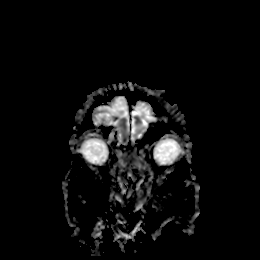

[Series 9: T1 · sagittal · 5.0mm · 0.75mm/px · 1 of 23 slices shown]
[im 1/23]
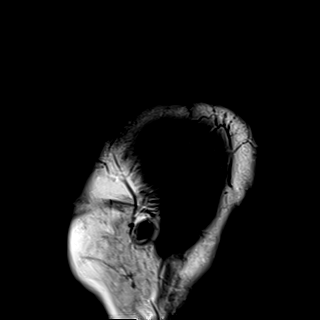

[Series 10: T2 · axial · 5.0mm · 0.72mm/px · z∈[-39,+121]mm · 2 of 28 slices shown (1 of 2)]
[im 1/28]
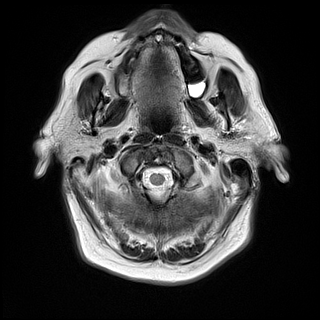
[im 28/28]
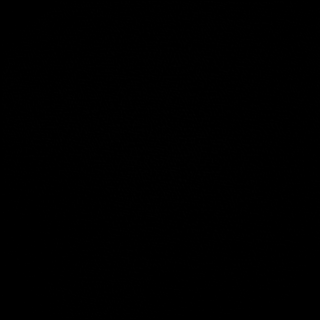

[Series 11: FLAIR · axial · 5.0mm · 0.45mm/px · z∈[-37,+123]mm · 2 of 28 slices shown]
[im 1/28]
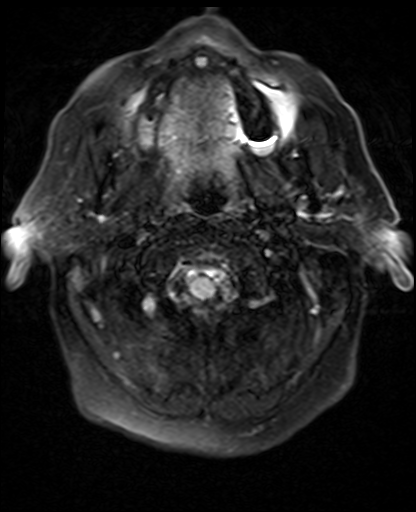
[im 28/28]
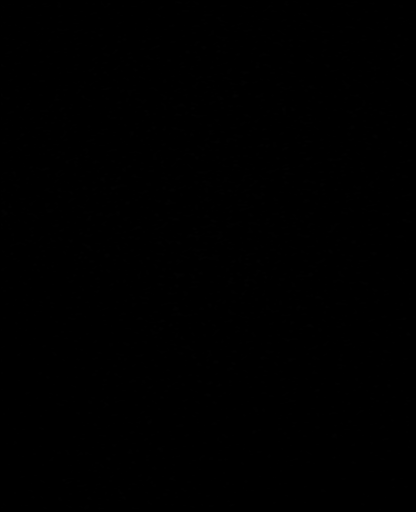

[Series 12: mag_images · axial · 3.0mm · 0.90mm/px · z∈[-45,+130]mm · 3 of 60 slices shown]
[im 1/60]
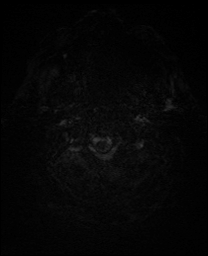
[im 30/60]
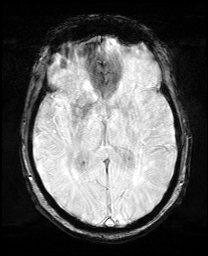
[im 60/60]
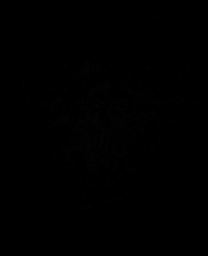

[Series 13: pha_images · axial · 3.0mm · 0.90mm/px · z∈[-42,+127]mm · 3 of 56 slices shown]
[im 1/56]
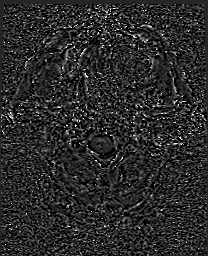
[im 28/56]
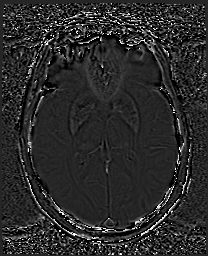
[im 56/56]
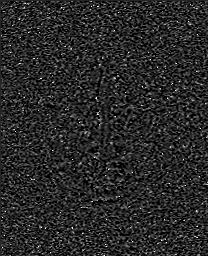

[Series 14: swi_images · axial · 3.0mm · 0.90mm/px · z∈[-45,+130]mm · 3 of 60 slices shown]
[im 1/60]
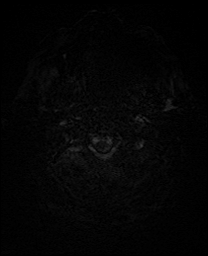
[im 30/60]
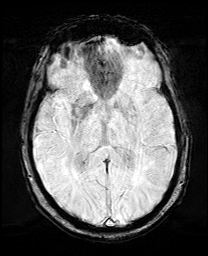
[im 60/60]
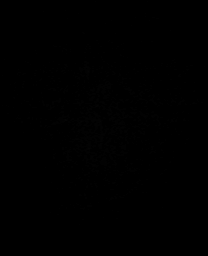

[Series 15: mip_images(sw) · axial · 24.0mm · 0.90mm/px · z∈[-34,+120]mm · 3 of 53 slices shown]
[im 1/53]
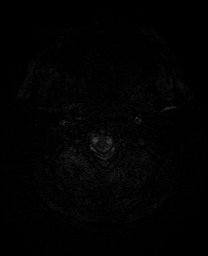
[im 27/53]
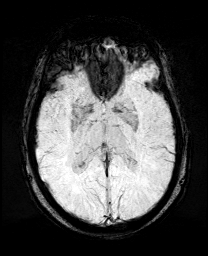
[im 53/53]
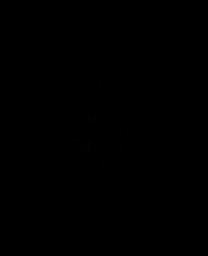

[Series 21: T2 · coronal · 5.0mm · 0.34mm/px · 2 of 29 slices shown (2 of 2)]
[im 1/29]
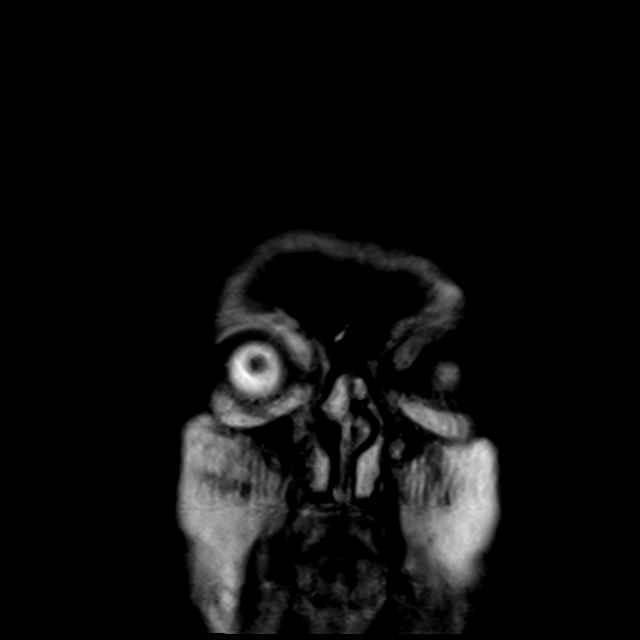
[im 29/29]
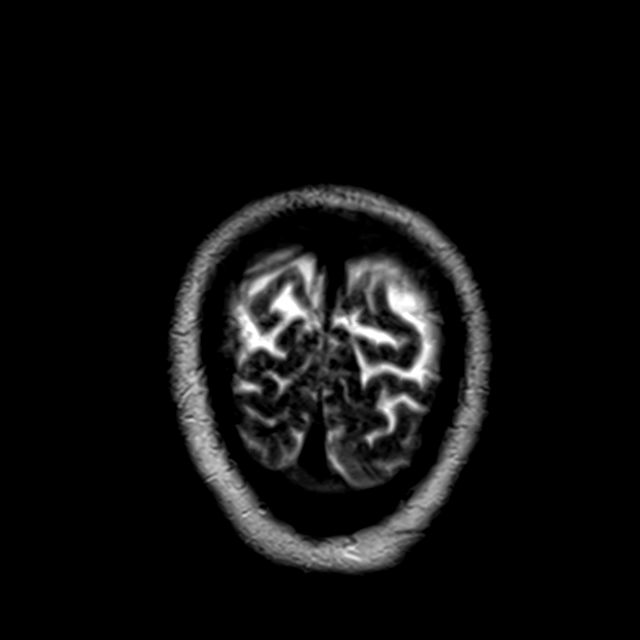

[34 of 48 positions shown; findings below may reference images not displayed]

FINDINGS: MRI HEAD FINDINGS

Brain: Diffusion imaging confirms the presence of a 3 cm region of
acute infarction in the right superior and lateral cerebellum. Mild
swelling but no hemorrhage or mass effect. No other acute
infarction. Elsewhere, there chronic small-vessel ischemic changes
of the pons. There are moderate chronic small-vessel ischemic
changes of the cerebral hemispheric white matter. No large vessel
territory infarction. No mass lesion, hemorrhage, hydrocephalus or
extra-axial collection.

Vascular: Major vessels at the base of the brain show flow.

Skull and upper cervical spine: Negative

Sinuses/Orbits: Clear/normal

Other: None

MRA HEAD FINDINGS

Both internal carotid arteries are widely patent through the skull
base and siphon regions. The anterior and middle cerebral vessels
are patent without proximal stenosis, aneurysm or vascular
malformation.

Both vertebral arteries are patent at the foramen magnum and to the
basilar. No basilar stenosis. Posterior inferior cerebellar arteries
show flow on each side. Right anterior inferior cerebellar artery is
seen. Both superior cerebellar arteries show flow presently. Both
posterior cerebral arteries show flow.
IMPRESSION: 3 cm region of acute infarction in the right superolateral
cerebellum. No hemorrhage or mass effect.

Moderate chronic small-vessel ischemic changes of the pons and
cerebral hemispheric white matter.

Intracranial MR angiography negative for large or medium vessel
occlusion. Specifically, no proximal posterior circulation occlusion
seen to explain the infarction.
